# Patient Record
Sex: Female | Born: 1970 | Race: White | Hispanic: No | Marital: Married | Smoking: Former smoker
Health system: Southern US, Community
[De-identification: ages and names within clinical notes are randomized; demographics above are authoritative.]

## PROBLEM LIST (undated history)

## (undated) DIAGNOSIS — K760 Fatty (change of) liver, not elsewhere classified: Secondary | ICD-10-CM

## (undated) DIAGNOSIS — D6859 Other primary thrombophilia: Secondary | ICD-10-CM

## (undated) DIAGNOSIS — E119 Type 2 diabetes mellitus without complications: Secondary | ICD-10-CM

## (undated) DIAGNOSIS — E785 Hyperlipidemia, unspecified: Secondary | ICD-10-CM

## (undated) HISTORY — PX: KNEE SURGERY: SHX244

## (undated) HISTORY — PX: ANKLE SURGERY: SHX546

## (undated) SURGICAL SUPPLY — 2 items
BALLN MUSTANG 10X80X135 (BALLOONS) ×2 IMPLANT
BALLN MUSTANG 7X80X75 (BALLOONS) ×2 IMPLANT

---

## 2009-10-18 ENCOUNTER — Inpatient Hospital Stay (HOSPITAL_COMMUNITY): Admission: EM | Admit: 2009-10-18 | Discharge: 2009-10-25 | Payer: Self-pay | Admitting: Emergency Medicine

## 2009-10-18 ENCOUNTER — Ambulatory Visit: Payer: Self-pay | Admitting: Vascular Surgery

## 2009-10-19 ENCOUNTER — Encounter: Payer: Self-pay | Admitting: Vascular Surgery

## 2011-03-29 LAB — CBC
MCV: 88 fL (ref 78.0–100.0)
Platelets: 260 10*3/uL (ref 150–400)
RBC: 2.9 MIL/uL — ABNORMAL LOW (ref 3.87–5.11)
WBC: 6.5 10*3/uL (ref 4.0–10.5)

## 2011-03-30 LAB — HEPARIN LEVEL (UNFRACTIONATED)
Heparin Unfractionated: 0.2 IU/mL — ABNORMAL LOW (ref 0.30–0.70)
Heparin Unfractionated: 0.33 IU/mL (ref 0.30–0.70)
Heparin Unfractionated: <0.1 IU/mL — ABNORMAL LOW (ref 0.30–0.70)
Heparin Unfractionated: <0.1 IU/mL — ABNORMAL LOW (ref 0.30–0.70)

## 2011-03-30 LAB — COMPREHENSIVE METABOLIC PANEL
ALT: 17 U/L (ref 0–35)
AST: 15 U/L (ref 0–37)
Alkaline Phosphatase: 88 U/L (ref 39–117)
BUN: 7 mg/dL (ref 6–23)
CO2: 25 mEq/L (ref 19–32)
Chloride: 103 mEq/L (ref 96–112)
Creatinine, Ser: 0.56 mg/dL (ref 0.4–1.2)
GFR calc Af Amer: >60 mL/min (ref >60)
GFR calc non Af Amer: >60 mL/min (ref >60)
Potassium: 3.5 mEq/L (ref 3.5–5.1)
Sodium: 134 mEq/L — ABNORMAL LOW (ref 135–145)
Total Bilirubin: 0.9 mg/dL (ref 0.3–1.2)

## 2011-03-30 LAB — CBC
HCT: 24.4 % — ABNORMAL LOW (ref 36.0–46.0)
HCT: 25.5 % — ABNORMAL LOW (ref 36.0–46.0)
HCT: 27.9 % — ABNORMAL LOW (ref 36.0–46.0)
HCT: 36.3 % (ref 36.0–46.0)
Hemoglobin: 12.6 g/dL (ref 12.0–15.0)
Hemoglobin: 8.6 g/dL — ABNORMAL LOW (ref 12.0–15.0)
Hemoglobin: 9 g/dL — ABNORMAL LOW (ref 12.0–15.0)
Hemoglobin: 9.2 g/dL — ABNORMAL LOW (ref 12.0–15.0)
MCHC: 34.6 g/dL (ref 30.0–36.0)
MCHC: 34.8 g/dL (ref 30.0–36.0)
MCHC: 35.3 g/dL (ref 30.0–36.0)
MCHC: 35.4 g/dL (ref 30.0–36.0)
MCHC: 35.5 g/dL (ref 30.0–36.0)
MCHC: 35.8 g/dL (ref 30.0–36.0)
MCV: 87.5 fL (ref 78.0–100.0)
MCV: 87.6 fL (ref 78.0–100.0)
Platelets: 139 10*3/uL — ABNORMAL LOW (ref 150–400)
Platelets: 148 10*3/uL — ABNORMAL LOW (ref 150–400)
Platelets: 167 10*3/uL (ref 150–400)
Platelets: 176 10*3/uL (ref 150–400)
Platelets: 213 10*3/uL (ref 150–400)
RBC: 3.15 MIL/uL — ABNORMAL LOW (ref 3.87–5.11)
RBC: 3.46 MIL/uL — ABNORMAL LOW (ref 3.87–5.11)
RBC: 4.13 MIL/uL (ref 3.87–5.11)
RDW: 15.4 % (ref 11.5–15.5)
RDW: 15.4 % (ref 11.5–15.5)
RDW: 15.6 % — ABNORMAL HIGH (ref 11.5–15.5)
RDW: 15.9 % — ABNORMAL HIGH (ref 11.5–15.5)
RDW: 15.9 % — ABNORMAL HIGH (ref 11.5–15.5)
WBC: 13.4 10*3/uL — ABNORMAL HIGH (ref 4.0–10.5)
WBC: 8.1 10*3/uL (ref 4.0–10.5)
WBC: 9 10*3/uL (ref 4.0–10.5)

## 2011-03-30 LAB — BASIC METABOLIC PANEL
BUN: 4 mg/dL — ABNORMAL LOW (ref 6–23)
BUN: 4 mg/dL — ABNORMAL LOW (ref 6–23)
BUN: 4 mg/dL — ABNORMAL LOW (ref 6–23)
CO2: 22 mEq/L (ref 19–32)
CO2: 27 mEq/L (ref 19–32)
CO2: 29 mEq/L (ref 19–32)
Calcium: 7.7 mg/dL — ABNORMAL LOW (ref 8.4–10.5)
Calcium: 8.1 mg/dL — ABNORMAL LOW (ref 8.4–10.5)
Calcium: 8.2 mg/dL — ABNORMAL LOW (ref 8.4–10.5)
Calcium: 9.5 mg/dL (ref 8.4–10.5)
Chloride: 101 mEq/L (ref 96–112)
Chloride: 99 mEq/L (ref 96–112)
Creatinine, Ser: 0.46 mg/dL (ref 0.4–1.2)
Creatinine, Ser: 0.55 mg/dL (ref 0.4–1.2)
Creatinine, Ser: 0.6 mg/dL (ref 0.4–1.2)
GFR calc Af Amer: >60 mL/min (ref >60)
GFR calc non Af Amer: >60 mL/min (ref >60)
Glucose, Bld: 100 mg/dL — ABNORMAL HIGH (ref 70–99)
Glucose, Bld: 104 mg/dL — ABNORMAL HIGH (ref 70–99)
Sodium: 133 mEq/L — ABNORMAL LOW (ref 135–145)
Sodium: 137 mEq/L (ref 135–145)

## 2011-03-30 LAB — DIFFERENTIAL
Eosinophils Absolute: 0.1 10*3/uL (ref 0.0–0.7)
Lymphocytes Relative: 16 % (ref 12–46)
Lymphs Abs: 2.1 10*3/uL (ref 0.7–4.0)
Monocytes Relative: 3 % (ref 3–12)
Neutrophils Relative %: 80 % — ABNORMAL HIGH (ref 43–77)

## 2011-03-30 LAB — FIBRINOGEN
Fibrinogen: 186 mg/dL — ABNORMAL LOW (ref 204–475)
Fibrinogen: 206 mg/dL (ref 204–475)

## 2011-03-30 LAB — PROTIME-INR
Prothrombin Time: 12.7 seconds (ref 11.6–15.2)
Prothrombin Time: 13.4 seconds (ref 11.6–15.2)
Prothrombin Time: 14.2 seconds (ref 11.6–15.2)

## 2011-03-30 LAB — TSH: TSH: 7.504 u[IU]/mL — ABNORMAL HIGH (ref 0.350–4.500)

## 2011-08-08 ENCOUNTER — Other Ambulatory Visit (HOSPITAL_COMMUNITY): Payer: Self-pay | Admitting: Obstetrics and Gynecology

## 2011-08-08 DIAGNOSIS — Z3682 Encounter for antenatal screening for nuchal translucency: Secondary | ICD-10-CM

## 2011-08-18 ENCOUNTER — Ambulatory Visit (HOSPITAL_COMMUNITY)
Admission: RE | Admit: 2011-08-18 | Discharge: 2011-08-18 | Disposition: A | Discharge status: Home / Self Care | Payer: Medicaid Other | Source: Ambulatory Visit | Attending: Obstetrics and Gynecology | Admitting: Obstetrics and Gynecology

## 2011-08-18 ENCOUNTER — Encounter (HOSPITAL_COMMUNITY): Payer: Self-pay

## 2011-08-18 ENCOUNTER — Other Ambulatory Visit: Payer: Self-pay | Admitting: Maternal and Fetal Medicine

## 2011-08-18 DIAGNOSIS — O09529 Supervision of elderly multigravida, unspecified trimester: Secondary | ICD-10-CM | POA: Insufficient documentation

## 2011-08-18 DIAGNOSIS — Z3682 Encounter for antenatal screening for nuchal translucency: Secondary | ICD-10-CM

## 2011-08-18 DIAGNOSIS — O34219 Maternal care for unspecified type scar from previous cesarean delivery: Secondary | ICD-10-CM | POA: Insufficient documentation

## 2011-08-18 NOTE — Progress Notes (Signed)
Genetic Counseling  High-Risk Gestation Note  Appointment Date:  08/18/2011 Referred By: Hyman Hopes., MD Date of Birth:  17-Oct-1971  Pregnancy History: [id number] Estimated Date of Delivery: 02/25/12 Estimated Gestational Age: [redacted]w[redacted]d  I met with [name] for prenatal genetic counseling given advanced maternal age.   She was counseled regarding maternal age and the association with risk for chromosome conditions due to nondisjunction with aging of the ova.   We reviewed chromosomes, nondisjunction, and the associated 1 in 27 risk for fetal aneuploidy in the first trimester related to a maternal age of 79 at delivery.  She was counseled that the risk for aneuploidy decreases as gestational age increases, accounting for those pregnancies which spontaneously abort.  We specifically discussed Down syndrome (trisomy 33), trisomies 60 and 22, and sex chromosome aneuploidies (47,XXX and 47,XXY) including the common features and prognoses of each.    We reviewed available screening and diagnostic options.  Regarding screening tests, we discussed the options of First screen, Integrated screen and ultrasound.  She understands that screening tests are used to modify a patient's a priori risk for aneuploidy, typically based on age.  This estimate provides a pregnancy specific risk assessment. It is not diagnostic, nor does it screen for all chromosome conditions.  We also reviewed the availability of diagnostic options including CVS and amniocentesis. A risk of 1 in 100 was given for CVS and 1 in 200-300 was given for amniocentesis, the primary complication being spontaneous pregnancy loss.   We discussed the risks, limitations, and benefits of each.  After reviewing these options, Shelby Conner elected to have First screen at this time.  She wishes to pursue these options to help ascertain her pregnancy specific risks for aneuploidy and stated that she likely will consider amniocentesis in  the second trimester.  She understands that ultrasound and First screen cannot rule out all birth defects or genetic syndromes.  [However, she was counseled that 50-80% of fetuses with Down syndrome and up to 90% of fetuses with trisomies 30 and 38, when well visualized, have detectable anomalies or soft markers by ultrasound.]   We discussed cystic fibrosis (CF) including: the features of CF, the incidence of 1 in 3300 in the Caucasian population, autosomal recessive inheritance, and the 25% chance of having a baby with CF if both parents are carriers of CF.  We also discussed the option of carrier testing including the pros and cons of carrier testing, as well as the option of prenatal testing if needed. We discussed that CF is included on the newborn screening panel in [place]. [name] elected to proceed with CF carrier screening at this time.   Both family histories were reviewed and found to be contributory  for a history of 3 miscarriages for the patient and bilateral pulmonary embolism in a previous pregnancy. 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 The patient reported that she is currently taking Lovenox given this history. She reported that her sister, her sister's children, and her father also have protein S and protein C deficiency. The family history is otherwise unremarkable for birth defects, mental retardation, recurrent pregnancy loss, and known genetic conditions. Without further information regarding the provided family history, an accurate genetic risk cannot be calculated.   Further genetic counseling is warranted if more information is obtained.  She denied exposure to environmental toxins or chemical agents.  She denied the use of tobacco or street drugs. She reported alcohol use  until 7 weeks, when she was aware of the pregnancy.  Prenatal alcohol exposure can increase the risk for growth delays, small head size, heart defects, eye and  facial differences, as well as behavior problems and learning disabilities. The risk of these to occur tends to increase with the amount of alcohol consumed. However, because there is no identified safe amount of alcohol in pregnancy, it is recommended to completely avoid alcohol in pregnancy. Given the reported amount of exposure, risk for associated effects are likely low in the current pregnancy. She denied significant viral illnesses during the course of her pregnancy.  Her medical and surgical history were contributory for protein S and C deficiency, which is treated by Lovenox, as previously discussed..  A complete obstetrical ultrasound was performed at the time of today's evaluation. The ultrasound report is reported separately.    We counseled the patient for approximately 30 minutes regarding the above risks.   Shelby Braun Vergia Chea, MS, [facility] 08/18/2011

## 2011-09-07 ENCOUNTER — Telehealth (HOSPITAL_COMMUNITY): Payer: Self-pay | Admitting: MS"

## 2011-09-07 NOTE — Telephone Encounter (Signed)
First trimester screen within normal range. Down syndrome risk reduced to 1 in 1500, and trisomy 18 risk reduced to 1 in 10,000. Reviewed not diagnostic and AFP only serun screen available in 2nd trimester, if desired for open neural tube defect screening. Patient and husband are considering amnio. She will contact our office this week if she desires to schedule amniocentesis. Cystic fibrosis carrier screen is negative for 97 most common mutations. Reduced risk to be carrier from 1 in 25 to 1 in 343.

## 2014-10-26 ENCOUNTER — Encounter (HOSPITAL_COMMUNITY): Payer: Self-pay

## 2020-11-11 ENCOUNTER — Inpatient Hospital Stay (HOSPITAL_COMMUNITY)
Admission: EM | Admit: 2020-11-11 | Discharge: 2020-11-17 | DRG: 433 | Disposition: A | Discharge status: Home Health | Payer: BC Managed Care – PPO | Attending: Internal Medicine | Admitting: Internal Medicine

## 2020-11-11 ENCOUNTER — Other Ambulatory Visit: Payer: Self-pay

## 2020-11-11 ENCOUNTER — Encounter (HOSPITAL_COMMUNITY): Payer: Self-pay | Admitting: Emergency Medicine

## 2020-11-11 ENCOUNTER — Emergency Department (HOSPITAL_COMMUNITY): Payer: BC Managed Care – PPO

## 2020-11-11 ENCOUNTER — Encounter (HOSPITAL_COMMUNITY): Payer: Self-pay

## 2020-11-11 DIAGNOSIS — K766 Portal hypertension: Secondary | ICD-10-CM | POA: Diagnosis not present

## 2020-11-11 DIAGNOSIS — Z789 Other specified health status: Secondary | ICD-10-CM | POA: Diagnosis not present

## 2020-11-11 DIAGNOSIS — E669 Obesity, unspecified: Secondary | ICD-10-CM | POA: Diagnosis present

## 2020-11-11 DIAGNOSIS — N179 Acute kidney failure, unspecified: Secondary | ICD-10-CM | POA: Diagnosis not present

## 2020-11-11 DIAGNOSIS — E871 Hypo-osmolality and hyponatremia: Secondary | ICD-10-CM | POA: Diagnosis present

## 2020-11-11 DIAGNOSIS — E119 Type 2 diabetes mellitus without complications: Secondary | ICD-10-CM | POA: Diagnosis present

## 2020-11-11 DIAGNOSIS — I10 Essential (primary) hypertension: Secondary | ICD-10-CM | POA: Diagnosis present

## 2020-11-11 DIAGNOSIS — Z515 Encounter for palliative care: Secondary | ICD-10-CM

## 2020-11-11 DIAGNOSIS — M879 Osteonecrosis, unspecified: Secondary | ICD-10-CM | POA: Diagnosis not present

## 2020-11-11 DIAGNOSIS — E785 Hyperlipidemia, unspecified: Secondary | ICD-10-CM | POA: Diagnosis present

## 2020-11-11 DIAGNOSIS — D696 Thrombocytopenia, unspecified: Secondary | ICD-10-CM | POA: Diagnosis present

## 2020-11-11 DIAGNOSIS — Z79899 Other long term (current) drug therapy: Secondary | ICD-10-CM

## 2020-11-11 DIAGNOSIS — Z23 Encounter for immunization: Secondary | ICD-10-CM | POA: Diagnosis not present

## 2020-11-11 DIAGNOSIS — Z7984 Long term (current) use of oral hypoglycemic drugs: Secondary | ICD-10-CM

## 2020-11-11 DIAGNOSIS — I959 Hypotension, unspecified: Secondary | ICD-10-CM | POA: Diagnosis present

## 2020-11-11 DIAGNOSIS — R188 Other ascites: Secondary | ICD-10-CM

## 2020-11-11 DIAGNOSIS — K2970 Gastritis, unspecified, without bleeding: Secondary | ICD-10-CM | POA: Diagnosis present

## 2020-11-11 DIAGNOSIS — K209 Esophagitis, unspecified without bleeding: Secondary | ICD-10-CM | POA: Diagnosis present

## 2020-11-11 DIAGNOSIS — D539 Nutritional anemia, unspecified: Secondary | ICD-10-CM | POA: Diagnosis present

## 2020-11-11 DIAGNOSIS — K3189 Other diseases of stomach and duodenum: Secondary | ICD-10-CM | POA: Diagnosis present

## 2020-11-11 DIAGNOSIS — D6859 Other primary thrombophilia: Secondary | ICD-10-CM | POA: Diagnosis present

## 2020-11-11 DIAGNOSIS — K7460 Unspecified cirrhosis of liver: Secondary | ICD-10-CM | POA: Diagnosis not present

## 2020-11-11 DIAGNOSIS — R7989 Other specified abnormal findings of blood chemistry: Secondary | ICD-10-CM | POA: Diagnosis present

## 2020-11-11 DIAGNOSIS — F1010 Alcohol abuse, uncomplicated: Secondary | ICD-10-CM | POA: Diagnosis not present

## 2020-11-11 DIAGNOSIS — K7031 Alcoholic cirrhosis of liver with ascites: Principal | ICD-10-CM | POA: Diagnosis present

## 2020-11-11 DIAGNOSIS — K7581 Nonalcoholic steatohepatitis (NASH): Secondary | ICD-10-CM | POA: Diagnosis not present

## 2020-11-11 DIAGNOSIS — Z20822 Contact with and (suspected) exposure to covid-19: Secondary | ICD-10-CM | POA: Diagnosis not present

## 2020-11-11 DIAGNOSIS — K7290 Hepatic failure, unspecified without coma: Principal | ICD-10-CM | POA: Diagnosis present

## 2020-11-11 DIAGNOSIS — Z7901 Long term (current) use of anticoagulants: Secondary | ICD-10-CM

## 2020-11-11 DIAGNOSIS — Z6832 Body mass index (BMI) 32.0-32.9, adult: Secondary | ICD-10-CM

## 2020-11-11 DIAGNOSIS — E876 Hypokalemia: Secondary | ICD-10-CM | POA: Diagnosis present

## 2020-11-11 DIAGNOSIS — D509 Iron deficiency anemia, unspecified: Secondary | ICD-10-CM | POA: Diagnosis not present

## 2020-11-11 DIAGNOSIS — Z7189 Other specified counseling: Secondary | ICD-10-CM | POA: Diagnosis not present

## 2020-11-11 DIAGNOSIS — Z87891 Personal history of nicotine dependence: Secondary | ICD-10-CM

## 2020-11-11 DIAGNOSIS — R161 Splenomegaly, not elsewhere classified: Secondary | ICD-10-CM | POA: Diagnosis present

## 2020-11-11 HISTORY — DX: Other primary thrombophilia: D68.59

## 2020-11-11 HISTORY — DX: Fatty (change of) liver, not elsewhere classified: K76.0

## 2020-11-11 HISTORY — DX: Type 2 diabetes mellitus without complications: E11.9

## 2020-11-11 HISTORY — DX: Hyperlipidemia, unspecified: E78.5

## 2020-11-11 LAB — CBC
HCT: 23.8 % — ABNORMAL LOW (ref 36.0–46.0)
Hemoglobin: 8.2 g/dL — ABNORMAL LOW (ref 12.0–15.0)
MCH: 48 pg — ABNORMAL HIGH (ref 26.0–34.0)
MCHC: 34.5 g/dL (ref 30.0–36.0)
MCV: 139.2 fL — ABNORMAL HIGH (ref 80.0–100.0)
Platelets: 108 10*3/uL — ABNORMAL LOW (ref 150–400)
RBC: 1.71 MIL/uL — ABNORMAL LOW (ref 3.87–5.11)
RDW: 18 % — ABNORMAL HIGH (ref 11.5–15.5)
WBC: 17.5 10*3/uL — ABNORMAL HIGH (ref 4.0–10.5)
nRBC: 0.7 % — ABNORMAL HIGH (ref 0.0–0.2)

## 2020-11-11 LAB — COMPREHENSIVE METABOLIC PANEL
ALT: 44 U/L (ref 0–44)
AST: 126 U/L — ABNORMAL HIGH (ref 15–41)
Albumin: 2.3 g/dL — ABNORMAL LOW (ref 3.5–5.0)
Alkaline Phosphatase: 167 U/L — ABNORMAL HIGH (ref 38–126)
Anion gap: 16 — ABNORMAL HIGH (ref 5–15)
BUN: 20 mg/dL (ref 6–20)
CO2: 36 mmol/L — ABNORMAL HIGH (ref 22–32)
Calcium: 8.9 mg/dL (ref 8.9–10.3)
Chloride: 76 mmol/L — ABNORMAL LOW (ref 98–111)
Creatinine, Ser: 1.32 mg/dL — ABNORMAL HIGH (ref 0.44–1.00)
GFR, Estimated: 50 mL/min — ABNORMAL LOW (ref >60)
Glucose, Bld: 90 mg/dL (ref 70–99)
Potassium: 2.4 mmol/L — CL (ref 3.5–5.1)
Sodium: 128 mmol/L — ABNORMAL LOW (ref 135–145)
Total Bilirubin: 19.8 mg/dL (ref 0.3–1.2)
Total Protein: 8.7 g/dL — ABNORMAL HIGH (ref 6.5–8.1)

## 2020-11-11 LAB — PROTIME-INR
INR: 2.1 — ABNORMAL HIGH (ref 0.8–1.2)
Prothrombin Time: 23 seconds — ABNORMAL HIGH (ref 11.4–15.2)

## 2020-11-11 LAB — RESPIRATORY PANEL BY RT PCR (FLU A&B, COVID)
Influenza A by PCR: NEGATIVE
Influenza B by PCR: NEGATIVE
SARS Coronavirus 2 by RT PCR: NEGATIVE

## 2020-11-11 LAB — LIPASE, BLOOD: Lipase: 82 U/L — ABNORMAL HIGH (ref 11–51)

## 2020-11-11 LAB — I-STAT BETA HCG BLOOD, ED (MC, WL, AP ONLY): I-stat hCG, quantitative: <5 m[IU]/mL (ref <5)

## 2020-11-11 MED ORDER — MAGNESIUM SULFATE 2 GM/50ML IV SOLN
2.0000 g | Freq: Once | INTRAVENOUS | Status: AC
  Administered 2020-11-11: 2 g via INTRAVENOUS
  Filled 2020-11-11: qty 50

## 2020-11-11 MED ORDER — SODIUM CHLORIDE 0.9 % IV SOLN
2.0000 g | INTRAVENOUS | Status: DC
  Administered 2020-11-12 – 2020-11-16 (×5): 2 g via INTRAVENOUS
  Filled 2020-11-11 (×5): qty 20

## 2020-11-11 MED ORDER — HYDROMORPHONE HCL 1 MG/ML IJ SOLN
0.5000 mg | Freq: Four times a day (QID) | INTRAMUSCULAR | Status: AC | PRN
  Administered 2020-11-12 (×2): 0.5 mg via INTRAVENOUS
  Filled 2020-11-11 (×2): qty 0.5

## 2020-11-11 MED ORDER — POTASSIUM CHLORIDE CRYS ER 20 MEQ PO TBCR
40.0000 meq | EXTENDED_RELEASE_TABLET | Freq: Once | ORAL | Status: AC
  Administered 2020-11-11: 40 meq via ORAL
  Filled 2020-11-11: qty 2

## 2020-11-11 MED ORDER — HYDROMORPHONE HCL 1 MG/ML IJ SOLN
0.5000 mg | Freq: Once | INTRAMUSCULAR | Status: AC
  Administered 2020-11-11: 0.5 mg via INTRAVENOUS
  Filled 2020-11-11: qty 1

## 2020-11-11 MED ORDER — IOHEXOL 300 MG/ML  SOLN
100.0000 mL | Freq: Once | INTRAMUSCULAR | Status: AC | PRN
  Administered 2020-11-11: 100 mL via INTRAVENOUS

## 2020-11-11 MED ORDER — SODIUM CHLORIDE 0.9 % IV BOLUS
1000.0000 mL | Freq: Once | INTRAVENOUS | Status: AC
  Administered 2020-11-11: 1000 mL via INTRAVENOUS

## 2020-11-11 MED ORDER — LACTULOSE 10 GM/15ML PO SOLN
20.0000 g | Freq: Two times a day (BID) | ORAL | Status: DC
  Administered 2020-11-12: 20 g via ORAL
  Filled 2020-11-11: qty 30

## 2020-11-11 MED ORDER — ONDANSETRON HCL 4 MG/2ML IJ SOLN
4.0000 mg | Freq: Once | INTRAMUSCULAR | Status: AC
  Administered 2020-11-11: 4 mg via INTRAVENOUS
  Filled 2020-11-11: qty 2

## 2020-11-11 MED ORDER — POTASSIUM CHLORIDE 10 MEQ/100ML IV SOLN
10.0000 meq | INTRAVENOUS | Status: AC
  Administered 2020-11-11 (×3): 10 meq via INTRAVENOUS
  Filled 2020-11-11 (×3): qty 100

## 2020-11-11 MED ORDER — SODIUM CHLORIDE 0.9% FLUSH
3.0000 mL | Freq: Two times a day (BID) | INTRAVENOUS | Status: DC
  Administered 2020-11-11 – 2020-11-16 (×7): 3 mL via INTRAVENOUS

## 2020-11-11 NOTE — ED Notes (Signed)
Pt admitted to 5N27; report called to [place], [place].

## 2020-11-11 NOTE — Progress Notes (Signed)
Patient arrived via stretcher to room 527 with belongings. Patient settled into room. Telemetry box placed on her and orders reviewed. Admission assessment will be completed shortly. Will continue to monitor.

## 2020-11-11 NOTE — ED Provider Notes (Signed)
[facility] Emergency Department Provider Note MRN:  [id number]  Arrival date & time: 11/11/20     Chief Complaint   Abdominal Pain   History of Present Illness   Shelby Conner is a 49 y.o. year-old female with a history of diabetes, protein C&S deficiency presenting to the ED with chief complaint of abdominal pain.  Bilateral flank and abdominal pain progressively worsening over the past 1 to 2 weeks.  Past few days her children have told her that she is turning yellow.  Endorsing persistent nausea and vomiting, trouble taking her home medications.  Denies fever, no chest pain or shortness of breath, no other complaints.  Review of Systems  A complete 10 system review of systems was obtained and all systems are negative except as noted in the HPI and PMH.   Patient's Health History    Past Medical History:  Diagnosis Date  . Diabetes mellitus without complication (HCC)   . Fatty liver   . Hyperlipidemia   . Protein C deficiency (HCC)   . Protein S deficiency [facility])     Past Surgical History:  Procedure Laterality Date  . ANKLE SURGERY Right   . CESAREAN SECTION    . KNEE SURGERY Left     History reviewed. No pertinent family history.  Social History   Socioeconomic History  . Marital status: Divorced    Spouse name: Not on file  . Number of children: Not on file  . Years of education: Not on file  . Highest education level: Not on file  Occupational History  . Not on file  Tobacco Use  . Smoking status: Former Games developer  . Smokeless tobacco: Never Used  Vaping Use  . Vaping Use: Never used  Substance and Sexual Activity  . Alcohol use: Yes    Comment: occasionally  . Drug use: Never  . Sexual activity: Not on file  Other Topics Concern  . Not on file  Social History Narrative  . Not on file   Social Determinants of Health   Financial Resource Strain:   . Difficulty of Paying Living Expenses: Not on file  Food Insecurity:    . Worried About Programme researcher, broadcasting/film/video in the Last Year: Not on file  . Ran Out of Food in the Last Year: Not on file  Transportation Needs:   . Lack of Transportation (Medical): Not on file  . Lack of Transportation (Non-Medical): Not on file  Physical Activity:   . Days of Exercise per Week: Not on file  . Minutes of Exercise per Session: Not on file  Stress:   . Feeling of Stress : Not on file  Social Connections:   . Frequency of Communication with Friends and Family: Not on file  . Frequency of Social Gatherings with Friends and Family: Not on file  . Attends Religious Services: Not on file  . Active Member of Clubs or Organizations: Not on file  . Attends Banker Meetings: Not on file  . Marital Status: Not on file  Intimate Partner Violence:   . Fear of Current or Ex-Partner: Not on file  . Emotionally Abused: Not on file  . Physically Abused: Not on file  . Sexually Abused: Not on file     Physical Exam   Vitals:   11/11/20 2130 11/11/20 2145  BP: 101/65 135/73  Pulse: 78 89  Resp: 17 17  Temp:    SpO2: 96% 94%    CONSTITUTIONAL: Chronically ill-appearing, NAD,  diffusely jaundiced NEURO:  Alert and oriented x 3, no focal deficits EYES:  eyes equal and reactive ENT/NECK:  no LAD, no JVD CARDIO: Regular rate, well-perfused, normal S1 and S2 PULM:  CTAB no wheezing or rhonchi GI/GU:  normal bowel sounds, non-distended, non-tender MSK/SPINE:  No gross deformities, moderate distention, mildly tender diffusely SKIN:  no rash, atraumatic PSYCH:  Appropriate speech and behavior  *Additional and/or pertinent findings included in MDM below  Diagnostic and Interventional Summary    EKG Interpretation  Date/Time:    Ventricular Rate:    PR Interval:    QRS Duration:   QT Interval:    QTC Calculation:   R Axis:     Text Interpretation:        Labs Reviewed  LIPASE, BLOOD - Abnormal; Notable for the following components:      Result Value    Lipase 82 (*)    All other components within normal limits  COMPREHENSIVE METABOLIC PANEL - Abnormal; Notable for the following components:   Sodium 128 (*)    Potassium 2.4 (*)    Chloride 76 (*)    CO2 36 (*)    Creatinine, Ser 1.32 (*)    Total Protein 8.7 (*)    Albumin 2.3 (*)    AST 126 (*)    Alkaline Phosphatase 167 (*)    Total Bilirubin 19.8 (*)    GFR, Estimated 50 (*)    Anion gap 16 (*)    All other components within normal limits  CBC - Abnormal; Notable for the following components:   WBC 17.5 (*)    RBC 1.71 (*)    Hemoglobin 8.2 (*)    HCT 23.8 (*)    MCV 139.2 (*)    MCH 48.0 (*)    RDW 18.0 (*)    Platelets 108 (*)    nRBC 0.7 (*)    All other components within normal limits  RESPIRATORY PANEL BY RT PCR (FLU A&B, COVID)  URINALYSIS, ROUTINE W REFLEX MICROSCOPIC  PROTIME-INR  HEPATITIS PANEL, ACUTE  I-STAT BETA HCG BLOOD, ED ([facility], [facility], [facility] ONLY)    US Abdomen Limited RUQ (LIVER/GB)  Final Result    CT ABDOMEN PELVIS W CONTRAST  Final Result    US LIVER DOPPLER    (Results Pending)    Medications  potassium chloride 10 mEq in 100 mL IVPB (10 mEq Intravenous New Bag/Given 11/11/20 2129)  sodium chloride 0.9 % bolus 1,000 mL (0 mLs Intravenous Stopped 11/11/20 1949)  HYDROmorphone (DILAUDID) injection 0.5 mg (0.5 mg Intravenous Given 11/11/20 1859)  magnesium sulfate IVPB 2 g 50 mL (0 g Intravenous Stopped 11/11/20 2004)  potassium chloride SA (KLOR-CON) CR tablet 40 mEq (40 mEq Oral Given 11/11/20 1851)  ondansetron (ZOFRAN) injection 4 mg (4 mg Intravenous Given 11/11/20 1857)  iohexol (OMNIPAQUE) 300 MG/ML solution 100 mL (100 mLs Intravenous Contrast Given 11/11/20 1941)     Procedures  /  Critical Care .Critical Care Performed by: Sabas Sous, MD Authorized by: Sabas Sous, MD   Critical care provider statement:    Critical care time (minutes):  45   Critical care was necessary to treat or prevent imminent or life-threatening  deterioration of the following conditions: Hepatic failure.   Critical care was time spent personally by me on the following activities:  Discussions with consultants, evaluation of patient's response to treatment, examination of patient, ordering and performing treatments and interventions, ordering and review of laboratory studies, ordering and review of  radiographic studies, pulse oximetry, re-evaluation of patient's condition, obtaining history from patient or surrogate and review of old charts    ED Course and Medical Decision Making  I have reviewed the triage vital signs, the nursing notes, and pertinent available records from the EMR.  Listed above are laboratory and imaging tests that I personally ordered, reviewed, and interpreted and then considered in my medical decision making (see below for details).  Concern for biliary obstruction, patient does not drink heavily, has no history of cirrhosis, denies heavy Tylenol use, no mustard ingestion.  Concern for possible choledocholithiasis versus portal venous thrombus given her hypercoagulable state.  Awaiting CT and ultrasound.     Work-up revealing cholelithiasis, no pancreatic mass.  Still awaiting liver Doppler.  Given the acute nature, there is concern for choledocholithiasis and a biliary obstruction.  Patient has no fever, vital signs improving with fluids, doubt cholangitis.  Will admit to hospital service, given the acute nature and severity of labs will also consult GI to make them aware.  Elmer Sow. Pilar Plate, MD [facility] Emergency Medicine [facility] [email or web address]  Final Clinical Impressions(s) / ED Diagnoses     ICD-10-CM   1. Decompensation of cirrhosis of liver (HCC)  K72.90    K74.60   2. LFT elevation  R79.89     ED Discharge Orders    None       Discharge Instructions Discussed with and Provided to Patient:   Discharge Instructions   None       Sabas Sous, MD 11/11/20  2212

## 2020-11-11 NOTE — ED Notes (Addendum)
Pt dropped O2 sat; O2 increased from 2L to 6L with improvement to 91%.

## 2020-11-11 NOTE — ED Triage Notes (Signed)
Pt endorses abd pain and back pain for 3 days. Pt jaundice and belly distended. Hx of DM, 2 blood disorders.

## 2020-11-11 NOTE — H&P (Addendum)
History and Physical   Shelby Conner JXB:[id number] DOB: 12/21/1971 DOA: 11/11/2020  PCP: Pcp, No   Patient coming from: Home  Chief Complaint: Abdominal pain  HPI: Shelby Conner is a 49 y.o. female with medical history significant of fatty liver, diabetes, protein C and S deficiency presents with progressive abdominal pain for the past 1 to 2 weeks.  Patient states that she has progressive abdominal pain for the past 1 to 2 weeks.  She is also noted progressive jaundice and abdominal distention for the past week.  She reports nausea vomiting.  She reports some mild shortness of breath and chest pain that appear to be related to inability to take deep breaths because of abdominal pain.  She denies fevers, constipation, diarrhea.  She states that she is an occasional drinker but has no significant alcohol use beyond that.  She does not use significant acetaminophen.  Does have a history of fatty liver which was diagnosed 6 years ago per her report.  ED Course: Vital signs significant for soft blood pressure in the 90s to 100s systolic, otherwise vital signs stable.  Lab work showed sodium 128, potassium 2.4, bicarb 36, leukocytosis of 17, hemoglobin stable at 8.2, platelets of 108.  LFTs showed AST 126, ALT 44, ALP 167, bili 19.8.  Lipase of 82.  UA and hepatitis panel are pending.  CT abdomen showed cholelithiasis, cirrhotic liver with signs of portal hypertension.  Ultrasound the abdomen showed cirrhosis and ascites.  Liver Doppler was completed but not yet read.  Patient received IV and p.o. potassium, IV pain medication, 1 L IV fluid bolus.  Review of Systems: As per HPI otherwise all other systems reviewed and are negative.  Past Medical History:  Diagnosis Date  . Diabetes mellitus without complication (HCC)   . Fatty liver   . Hyperlipidemia   . Protein C deficiency (HCC)   . Protein S deficiency [facility](HCC)     Past Surgical History:  Procedure Laterality Date  . ANKLE  SURGERY Right   . CESAREAN SECTION    . KNEE SURGERY Left     Social History  reports that she has quit smoking. She has never used smokeless tobacco. She reports current alcohol use. She reports that she does not use drugs.  No Known Allergies  History reviewed. No pertinent family history. Reviewed on admission  Prior to Admission medications   Medication Sig Start Date End Date Taking? Authorizing Provider  enoxaparin (LOVENOX) 80 MG/0.8ML SOLN Inject into the skin 2 (two) times daily.      [provider]  ezetimibe (ZETIA) 10 MG tablet Take 10 mg by mouth daily. 09/01/20   [provider]  metFORMIN (GLUCOPHAGE) 500 MG tablet Take 500 mg by mouth 2 (two) times daily. 06/08/20   [provider]  PRENATAL VITAMINS PO Take by mouth.      [provider]  rosuvastatin (CRESTOR) 40 MG tablet Take 40 mg by mouth daily. 09/01/20   [provider]  XARELTO 20 MG TABS tablet Take 20 mg by mouth at bedtime. 09/01/20   [provider]    Physical Exam: Vitals:   11/11/20 2215 11/11/20 2300 11/11/20 2347 11/12/20 0002  BP: (!) 92/58 96/62 98/64    Pulse: 86 82 86   Resp: 11 14 17    Temp:   98.6 F (37 C)   TempSrc:   Oral   SpO2: 92% 96% 90%   Weight:    74.3 kg  Height:    5'  1" (1.549 m)   Physical Exam Constitutional:      Comments: Ill appearing female in mild discomfort  HENT:     Head: Normocephalic and atraumatic.     Mouth/Throat:     Mouth: Mucous membranes are moist.     Pharynx: Oropharynx is clear.  Eyes:     General: Scleral icterus present.     Extraocular Movements: Extraocular movements intact.     Pupils: Pupils are equal, round, and reactive to light.  Cardiovascular:     Rate and Rhythm: Normal rate and regular rhythm.     Pulses: Normal pulses.     Heart sounds: Normal heart sounds.  Pulmonary:     Effort: Pulmonary effort is normal. No respiratory distress.     Breath sounds: Normal breath sounds.   Abdominal:     General: Bowel sounds are normal. There is distension.     Palpations: Abdomen is soft.     Tenderness: There is abdominal tenderness.     Comments: Diffusely tender to palpation  Musculoskeletal:        General: No swelling or deformity.  Skin:    General: Skin is warm and dry.     Coloration: Skin is jaundiced.  Neurological:     General: No focal deficit present.     Comments: Mildly confused at times but able to answer all questions appropriately if given time to think    Labs on Admission: I have personally reviewed following labs and imaging studies  CBC: Recent Labs  Lab 11/11/20 1612  WBC 17.5*  HGB 8.2*  HCT 23.8*  MCV 139.2*  PLT 108*    Basic Metabolic Panel: Recent Labs  Lab 11/11/20 1612  NA 128*  K 2.4*  CL 76*  CO2 36*  GLUCOSE 90  BUN 20  CREATININE 1.32*  CALCIUM 8.9    GFR: Estimated Creatinine Clearance: 48.6 mL/min (A) (by C-G formula based on SCr of 1.32 mg/dL (H)).  Liver Function Tests: Recent Labs  Lab 11/11/20 1612  AST 126*  ALT 44  ALKPHOS 167*  BILITOT 19.8*  PROT 8.7*  ALBUMIN 2.3*    Urine analysis: No results found for: COLORURINE, APPEARANCEUR, LABSPEC, PHURINE, GLUCOSEU, HGBUR, BILIRUBINUR, KETONESUR, PROTEINUR, UROBILINOGEN, NITRITE, LEUKOCYTESUR  Radiological Exams on Admission: CT ABDOMEN PELVIS W CONTRAST  Result Date: 11/11/2020 CLINICAL DATA:  49 year old female with abdominal pain. EXAM: CT ABDOMEN AND PELVIS WITH CONTRAST TECHNIQUE: Multidetector CT imaging of the abdomen and pelvis was performed using the standard protocol following bolus administration of intravenous contrast. CONTRAST:  OMNIPAQUE IOHEXOL 300 MG/ML  SOLN COMPARISON:  None. FINDINGS: Lower chest: The visualized lung bases are clear. No intra-abdominal free air. Small ascites. Hepatobiliary: There is severe fatty infiltration of the liver with surface irregularity consistent with changes of cirrhosis. The liver is enlarged  measuring 19 cm in midclavicular length. Small gallstones. No pericholecystic fluid. Pancreas: Unremarkable. No pancreatic ductal dilatation or surrounding inflammatory changes. Spleen: Mildly enlarged spleen measuring approximately 14 cm in greatest length. Adrenals/Urinary Tract: The adrenal glands unremarkable. There is no hydronephrosis on either side. The visualized ureters and urinary bladder appear unremarkable. Stomach/Bowel: Diffuse thickened appearance of the colon, likely related to underdistention and hepatic colopathy. Clinical correlation is recommended to exclude colitis. There is no bowel obstruction. The appendix is not identified with certainty. Vascular/Lymphatic: Mild aortoiliac atherosclerotic disease. The IVC is unremarkable. An infrarenal IVC filter is noted. There is a left common iliac vein stent which is poorly evaluated on this  CT due to suboptimal opacification. No portal venous gas. Upper abdominal varices including paraesophageal and gastric varices. There is no adenopathy. Reproductive: The uterus is anteverted and grossly unremarkable. No adnexal masses. Other: Mild subcutaneous edema. 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 No acute fracture or cortical collapse. IMPRESSION: 1. Enlarged cirrhotic liver with severe fatty infiltration and evidence of portal hypertension including splenomegaly, ascites, and upper abdominal varices. 2. Cholelithiasis. 3. Underdistention of the colon versus hepatic colopathy. Clinical correlation is recommended to exclude colitis. No bowel obstruction. 4. 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 No acute fracture or cortical collapse. 5. Aortic Atherosclerosis (ICD10-I70.0). Electronically Signed   By: Elgie Collard M.D.   On: 11/11/2020 19:57   US Abdomen Limited RUQ (LIVER/GB)  Result Date: 11/11/2020 CLINICAL DATA:  Elevated LFTs EXAM: ULTRASOUND ABDOMEN LIMITED RIGHT UPPER QUADRANT  COMPARISON:  None. FINDINGS: Gallbladder: No gallstones or wall thickening visualized. No sonographic Murphy sign noted by sonographer. Common bile duct: Diameter: 2 mm, nondilated Liver: Diffusely increased hepatic echogenicity with a slightly nodular hepatic surface contour. No focal liver lesion is seen though evaluation limited by diminished through transmission of the liver. No visible intrahepatic biliary ductal dilatation. Portal vein is patent on color Doppler imaging with normal direction of blood flow towards the liver. Other: Extensive abdominal bowel gas. Abdominal ascites seen in all 4 quadrants. IMPRESSION: 1. Diffusely increased hepatic echogenicity with diminished through transmission as well as a nodular hepatic surface contour. Findings are somewhat nonspecific but can be seen in the setting of hepatic steatosis and intrinsic liver disease/cirrhosis. 2. Abdominal ascites is seen in all 4 quadrants. Electronically Signed   By: Kreg Shropshire M.D.   On: 11/11/2020 20:22   EKG: Not yet obtained, ordered.  Assessment/Plan Principal Problem:   Decompensated hepatic cirrhosis (HCC) Active Problems:   Hypokalemia   Protein S deficiency (HCC)   Protein C deficiency (HCC)   Ascites   Type 2 diabetes mellitus without complication (HCC)  ?SBP Fatty Liver Abdominal Pain New decompensated hepatic cirrhosis > MELD 32 on admission, CT and ultrasound with cirrhosis, and ascites.  Choleliths also noted but no ductal dilation. > GI consulted in ED  > 1-2 weeks of worsening abdominal pain, ascites, and jaundice > 6 year hx of fatty liver, no previous cirrhosis diagnosis > Hepatitis labs ordered in ED - Appreciate GI recommendation (will see in AM) - IR for Dx / Therapeutic Paracentesis (Pro, LDH, Cell ct, Gram stain, Cx) - Ceftriaxone 2g Daily for possible SBP - Lactulose twice daily given mild confusion, goal of at least 2 BMs per day - AM CMP, CBC, INR, LDH  Hypokalemia Acute renal  failure > Creatinine 1.32 from baseline of around 0.5.  Concern for possible hepatorenal, will monitor response to initial 1 L received in ED. > K 2.4 in the ED > Received 40 mEq p.o. KCl and 3 rounds of IV, for a total of 70 mEq in ED > Received IV magnesium in ED - We will check magnesium level - Add additional 2 rounds of IV potassium - Recheck in a.m. - Obtaining EKG, not yet done  Protein C and S deficiency > Patient states she has been transitioned to Xarelto - Will hold for tonight and plan to resume tomorrow unless work-up shows reason for discontinuation  Diabetes > Metformin at home - Hold p.o. Metformin - SSI  DVT prophylaxis: Xarelto  Code Status:   Full Family Communication:  None  on admission  Disposition Plan:   Patient is from:  Home  Anticipated DC to:  Home  Anticipated DC date:  Pending clinical course  Anticipated DC barriers: New diagnosis of cirrhosis  Consults called:  GI, consulted by EDP stated we will see tomorrow Admission status:  Inpatient, telemetry  Severity of Illness: The appropriate patient status for this patient is INPATIENT. Inpatient status is judged to be reasonable and necessary in order to provide the required intensity of service to ensure the patient's safety. The patient's presenting symptoms, physical exam findings, and initial radiographic and laboratory data in the context of their chronic comorbidities is felt to place them at high risk for further clinical deterioration. Furthermore, it is not anticipated that the patient will be medically stable for discharge from the hospital within 2 midnights of admission. The following factors support the patient status of inpatient.   " The patient's presenting symptoms include abdominal pain, jaundice, nausea vomiting. " The worrisome physical exam findings include abdominal distention, jaundice, mild confusion. " The initial radiographic and laboratory data are worrisome because of ascites,  mild hyponatremia, hypokalemia, bilirubin of 19.8, leukocytosis to 11, hemoglobin stable at 8.2. " The chronic co-morbidities include protein CNS deficiency on chronic anticoagulation.  * I certify that at the point of admission it is my clinical judgment that the patient will require inpatient hospital care spanning beyond 2 midnights from the point of admission due to high intensity of service, high risk for further deterioration and high frequency of surveillance required.Synetta Fail MD [facility]  How to contact the [facility] Attending or Consulting provider 7A - 7P or covering provider during after hours 7P -7A, for this patient?   1. Check the care team in [facility] and look for a) attending/consulting [facility] provider listed and b) the [facility] team listed 2. Log into [email or web address] and use [place]'s universal password to access. If you do not have the password, please contact the hospital operator. 3. Locate the [facility] provider you are looking for under [facility] and page to a number that you can be directly reached. 4. If you still have difficulty reaching the provider, please page the [facility] (Director on Call) for the Hospitalists listed on amion for assistance.  11/12/2020, 12:39 AM

## 2020-11-11 NOTE — ED Notes (Signed)
Pt to CT

## 2020-11-12 ENCOUNTER — Other Ambulatory Visit: Payer: Self-pay

## 2020-11-12 ENCOUNTER — Inpatient Hospital Stay (HOSPITAL_COMMUNITY): Payer: BC Managed Care – PPO

## 2020-11-12 DIAGNOSIS — Z515 Encounter for palliative care: Secondary | ICD-10-CM

## 2020-11-12 DIAGNOSIS — N179 Acute kidney failure, unspecified: Secondary | ICD-10-CM

## 2020-11-12 DIAGNOSIS — Z7189 Other specified counseling: Secondary | ICD-10-CM

## 2020-11-12 DIAGNOSIS — R188 Other ascites: Secondary | ICD-10-CM

## 2020-11-12 DIAGNOSIS — E119 Type 2 diabetes mellitus without complications: Secondary | ICD-10-CM

## 2020-11-12 DIAGNOSIS — E876 Hypokalemia: Secondary | ICD-10-CM

## 2020-11-12 DIAGNOSIS — D6859 Other primary thrombophilia: Secondary | ICD-10-CM

## 2020-11-12 DIAGNOSIS — Z789 Other specified health status: Secondary | ICD-10-CM

## 2020-11-12 HISTORY — PX: IR PARACENTESIS: IMG2679

## 2020-11-12 LAB — LACTATE DEHYDROGENASE, PLEURAL OR PERITONEAL FLUID: LD, Fluid: 83 U/L — ABNORMAL HIGH (ref 3–23)

## 2020-11-12 LAB — COMPREHENSIVE METABOLIC PANEL
ALT: 39 U/L (ref 0–44)
AST: 112 U/L — ABNORMAL HIGH (ref 15–41)
Albumin: 2 g/dL — ABNORMAL LOW (ref 3.5–5.0)
Alkaline Phosphatase: 129 U/L — ABNORMAL HIGH (ref 38–126)
Anion gap: 15 (ref 5–15)
BUN: 23 mg/dL — ABNORMAL HIGH (ref 6–20)
CO2: 33 mmol/L — ABNORMAL HIGH (ref 22–32)
Calcium: 8.5 mg/dL — ABNORMAL LOW (ref 8.9–10.3)
Chloride: 80 mmol/L — ABNORMAL LOW (ref 98–111)
Creatinine, Ser: 1.39 mg/dL — ABNORMAL HIGH (ref 0.44–1.00)
GFR, Estimated: 47 mL/min — ABNORMAL LOW (ref >60)
Glucose, Bld: 90 mg/dL (ref 70–99)
Potassium: 3.7 mmol/L (ref 3.5–5.1)
Sodium: 128 mmol/L — ABNORMAL LOW (ref 135–145)
Total Bilirubin: 18.3 mg/dL (ref 0.3–1.2)
Total Protein: 7.6 g/dL (ref 6.5–8.1)

## 2020-11-12 LAB — CBC
HCT: 21.5 % — ABNORMAL LOW (ref 36.0–46.0)
Hemoglobin: 7.5 g/dL — ABNORMAL LOW (ref 12.0–15.0)
MCH: 48.7 pg — ABNORMAL HIGH (ref 26.0–34.0)
MCHC: 34.9 g/dL (ref 30.0–36.0)
MCV: 139.6 fL — ABNORMAL HIGH (ref 80.0–100.0)
Platelets: UNDETERMINED 10*3/uL (ref 150–400)
RBC: 1.54 MIL/uL — ABNORMAL LOW (ref 3.87–5.11)
RDW: 18.6 % — ABNORMAL HIGH (ref 11.5–15.5)
WBC: 14.6 10*3/uL — ABNORMAL HIGH (ref 4.0–10.5)
nRBC: 0.4 % — ABNORMAL HIGH (ref 0.0–0.2)

## 2020-11-12 LAB — HEPATITIS PANEL, ACUTE
HCV Ab: NONREACTIVE
Hep A IgM: NONREACTIVE
Hep B C IgM: NONREACTIVE
Hepatitis B Surface Ag: NONREACTIVE

## 2020-11-12 LAB — BODY FLUID CELL COUNT WITH DIFFERENTIAL
Eos, Fluid: 0 %
Lymphs, Fluid: 36 %
Monocyte-Macrophage-Serous Fluid: 32 % — ABNORMAL LOW (ref 50–90)
Neutrophil Count, Fluid: 32 % — ABNORMAL HIGH (ref 0–25)
Total Nucleated Cell Count, Fluid: 61 cu mm (ref 0–1000)

## 2020-11-12 LAB — GLUCOSE, CAPILLARY
Glucose-Capillary: 86 mg/dL (ref 70–99)
Glucose-Capillary: 91 mg/dL (ref 70–99)
Glucose-Capillary: 87 mg/dL (ref 70–99)
Glucose-Capillary: 115 mg/dL — ABNORMAL HIGH (ref 70–99)

## 2020-11-12 LAB — GLUCOSE, PLEURAL OR PERITONEAL FLUID: Glucose, Fluid: 82 mg/dL

## 2020-11-12 LAB — IRON AND TIBC: Iron: 87 ug/dL (ref 28–170)

## 2020-11-12 LAB — OSMOLALITY: Osmolality: 293 mOsm/kg (ref 275–295)

## 2020-11-12 LAB — PROTIME-INR
INR: 2.1 — ABNORMAL HIGH (ref 0.8–1.2)
Prothrombin Time: 22.4 seconds — ABNORMAL HIGH (ref 11.4–15.2)

## 2020-11-12 LAB — GRAM STAIN

## 2020-11-12 LAB — AMMONIA: Ammonia: 36 umol/L — ABNORMAL HIGH (ref 9–35)

## 2020-11-12 LAB — HIV ANTIBODY (ROUTINE TESTING W REFLEX): HIV Screen 4th Generation wRfx: NONREACTIVE

## 2020-11-12 LAB — FERRITIN: Ferritin: 1167 ng/mL — ABNORMAL HIGH (ref 11–307)

## 2020-11-12 LAB — MAGNESIUM: Magnesium: 2.5 mg/dL — ABNORMAL HIGH (ref 1.7–2.4)

## 2020-11-12 LAB — PROTEIN, PLEURAL OR PERITONEAL FLUID: Total protein, fluid: <3 g/dL

## 2020-11-12 LAB — LACTATE DEHYDROGENASE: LDH: 277 U/L — ABNORMAL HIGH (ref 98–192)

## 2020-11-12 MED ORDER — ADULT MULTIVITAMIN W/MINERALS CH
1.0000 | ORAL_TABLET | Freq: Every day | ORAL | Status: DC
  Administered 2020-11-12 – 2020-11-17 (×6): 1 via ORAL
  Filled 2020-11-12 (×7): qty 1

## 2020-11-12 MED ORDER — INFLUENZA VAC SPLIT QUAD 0.5 ML IM SUSY
0.5000 mL | PREFILLED_SYRINGE | INTRAMUSCULAR | Status: AC
  Administered 2020-11-15: 0.5 mL via INTRAMUSCULAR
  Filled 2020-11-12 (×2): qty 0.5

## 2020-11-12 MED ORDER — ALBUMIN HUMAN 25 % IV SOLN
50.0000 g | Freq: Four times a day (QID) | INTRAVENOUS | Status: AC
  Administered 2020-11-12 (×2): 50 g via INTRAVENOUS
  Filled 2020-11-12: qty 200

## 2020-11-12 MED ORDER — RIVAROXABAN 20 MG PO TABS: 20.0000 mg | ORAL_TABLET | Freq: Every day | ORAL | Status: DC

## 2020-11-12 MED ORDER — LIDOCAINE HCL 1 % IJ SOLN
INTRAMUSCULAR | Status: AC
  Filled 2020-11-12: qty 20

## 2020-11-12 MED ORDER — LACTULOSE 10 GM/15ML PO SOLN
30.0000 g | Freq: Two times a day (BID) | ORAL | Status: DC
  Administered 2020-11-12 – 2020-11-13 (×3): 30 g via ORAL
  Filled 2020-11-12 (×3): qty 45

## 2020-11-12 MED ORDER — PNEUMOCOCCAL VAC POLYVALENT 25 MCG/0.5ML IJ INJ
0.5000 mL | INJECTION | INTRAMUSCULAR | Status: AC
  Administered 2020-11-15: 0.5 mL via INTRAMUSCULAR
  Filled 2020-11-12 (×2): qty 0.5

## 2020-11-12 MED ORDER — INSULIN ASPART 100 UNIT/ML ~~LOC~~ SOLN: 0.0000 [IU] | Freq: Three times a day (TID) | SUBCUTANEOUS | Status: DC

## 2020-11-12 MED ORDER — POTASSIUM CHLORIDE 10 MEQ/100ML IV SOLN
10.0000 meq | INTRAVENOUS | Status: AC
  Administered 2020-11-12 (×2): 10 meq via INTRAVENOUS
  Filled 2020-11-12 (×2): qty 100

## 2020-11-12 MED ORDER — ENSURE ENLIVE PO LIQD
237.0000 mL | Freq: Three times a day (TID) | ORAL | Status: DC
  Administered 2020-11-13 – 2020-11-16 (×8): 237 mL via ORAL
  Filled 2020-11-12 (×3): qty 237

## 2020-11-12 NOTE — Consult Note (Addendum)
[facility] Consultation Note  Referring Provider: [facility] Primary Care Physician:  Pcp, No Primary Gastroenterologist: Gentry Fitz  Reason for Consultation: Decompensated cirrhosis  HPI: Shelby Conner is a 49 y.o. female with past medical history of protein C and S deficiency and fatty liver disease presenting for consultation of decompensated cirrhosis.  Patient presented to the ED yesterday due to abdominal pain and jaundice.  She states she has been noting abdominal pain and distention for the last 2 to 3 weeks.  Her family noticed she started having yellowing of the skin and eyes approximately 1 week ago.  Patient reports nausea but no vomiting.  She has felt short of breath as her abdominal distention has increased.  She also notes extreme fatigue.  Denies any melena or hematochezia.  Patient denies any prior episodes similar to this.  Patient denies recent Tylenol use, states she only takes it infrequently as needed.  Denies frequent alcohol use.  Last drink was over 1 month ago.  Only drinks socially, though notes ~1 drink daily for approximately 1 month while her mother-in-law was visiting, as they share drinks together; however, no history of excessive alcohol use.  Denies any IV drug use, blood transfusions, or or blood exposures.  Denies any new medication changes.  She is on Xarelto for hypercoagulability (Protein C and S deficiency). States last dose was "a few days ago."  States her father has some sort of liver disease, though she is unsure of the exact diagnosis.  Past Medical History:  Diagnosis Date  . Diabetes mellitus without complication (HCC)   . Fatty liver   . Hyperlipidemia   . Protein C deficiency (HCC)   . Protein S deficiency [facility])     Past Surgical History:  Procedure Laterality Date  . ANKLE SURGERY Right   . CESAREAN SECTION    . KNEE SURGERY Left     Prior to Admission medications   Medication Sig Start Date End Date Taking?  Authorizing Provider  ezetimibe (ZETIA) 10 MG tablet Take 10 mg by mouth daily. 09/01/20  Yes [provider]  metFORMIN (GLUCOPHAGE) 500 MG tablet Take 500 mg by mouth 2 (two) times daily. 06/08/20  Yes [provider]  rosuvastatin (CRESTOR) 40 MG tablet Take 40 mg by mouth daily. 09/01/20  Yes [provider]  XARELTO 20 MG TABS tablet Take 20 mg by mouth at bedtime. 09/01/20  Yes [provider]    Current Facility-Administered Medications  Medication Dose Route Frequency Provider Last Rate Last Admin  . cefTRIAXone (ROCEPHIN) 2 g in sodium chloride 0.9 % 100 mL IVPB  2 g Intravenous Q24H Synetta Fail, MD 200 mL/hr at 11/12/20 0107 2 g at 11/12/20 0107  . HYDROmorphone (DILAUDID) injection 0.5 mg  0.5 mg Intravenous Q6H PRN Synetta Fail, MD      . Melene Muller ON 11/13/2020] influenza vac split quadrivalent PF (FLUARIX) injection 0.5 mL  0.5 mL Intramuscular Tomorrow-1000 Synetta Fail, MD      . insulin aspart (novoLOG) injection 0-9 Units  0-9 Units Subcutaneous TID WC Synetta Fail, MD      . lactulose (CHRONULAC) 10 GM/15ML solution 20 g  20 g Oral BID Synetta Fail, MD   20 g at 11/12/20 0105  . [START ON 11/13/2020] pneumococcal 23 valent vaccine (PNEUMOVAX-23) injection 0.5 mL  0.5 mL Intramuscular Tomorrow-1000 Synetta Fail, MD      . rivaroxaban Carlena Hurl) tablet 20 mg  20 mg Oral Q supper Beola Cord  B, MD      . sodium chloride flush (NS) 0.9 % injection 3 mL  3 mL Intravenous Q12H Synetta Fail, MD   3 mL at 11/11/20 2223    Allergies as of 11/11/2020  . (No Known Allergies)    History reviewed. No pertinent family history.  Social History   Socioeconomic History  . Marital status: Divorced    Spouse name: Not on file  . Number of children: Not on file  . Years of education: Not on file  . Highest education level: Not on file  Occupational History  . Not on file  Tobacco Use  . Smoking status:  Former Games developer  . Smokeless tobacco: Never Used  Vaping Use  . Vaping Use: Never used  Substance and Sexual Activity  . Alcohol use: Yes    Comment: occasionally  . Drug use: Never  . Sexual activity: Not on file  Other Topics Concern  . Not on file  Social History Narrative  . Not on file   Social Determinants of Health   Financial Resource Strain:   . Difficulty of Paying Living Expenses: Not on file  Food Insecurity:   . Worried About Programme researcher, broadcasting/film/video in the Last Year: Not on file  . Ran Out of Food in the Last Year: Not on file  Transportation Needs:   . Lack of Transportation (Medical): Not on file  . Lack of Transportation (Non-Medical): Not on file  Physical Activity:   . Days of Exercise per Week: Not on file  . Minutes of Exercise per Session: Not on file  Stress:   . Feeling of Stress : Not on file  Social Connections:   . Frequency of Communication with Friends and Family: Not on file  . Frequency of Social Gatherings with Friends and Family: Not on file  . Attends Religious Services: Not on file  . Active Member of Clubs or Organizations: Not on file  . Attends Banker Meetings: Not on file  . Marital Status: Not on file  Intimate Partner Violence:   . Fear of Current or Ex-Partner: Not on file  . Emotionally Abused: Not on file  . Physically Abused: Not on file  . Sexually Abused: Not on file    Review of Systems: Review of Systems  Constitutional: Positive for malaise/fatigue. Negative for chills, fever and weight loss.  HENT: Negative for hearing loss and tinnitus.   Eyes: Negative for pain and redness.  Respiratory: Positive for shortness of breath. Negative for cough.   Cardiovascular: Negative for chest pain and palpitations.  Gastrointestinal: Positive for abdominal pain and nausea. Negative for blood in stool, constipation, diarrhea, heartburn, melena and vomiting.  Genitourinary: Negative for flank pain and hematuria.   Musculoskeletal: Negative for falls and joint pain.  Skin: Negative for itching and rash.       +yellowing  Neurological: Negative for seizures and loss of consciousness.  Endo/Heme/Allergies: Negative for polydipsia. Does not bruise/bleed easily.  Psychiatric/Behavioral: Negative for substance abuse. The patient is not nervous/anxious.     Physical Exam: Vital signs in last 24 hours: Temp:  [97.7 F (36.5 C)-98.7 F (37.1 C)] 97.7 F (36.5 C) (11/19 0810) Pulse Rate:  [78-105] 87 (11/19 0810) Resp:  [11-21] 15 (11/19 0810) BP: (92-135)/(58-82) 93/60 (11/19 0810) SpO2:  [73 %-96 %] 91 % (11/19 0810) Weight:  [74.3 kg] 74.3 kg (11/19 0002) Last BM Date: 11/09/20  Physical Exam Vitals reviewed.  Constitutional:  General: She is not in acute distress.    Appearance: She is ill-appearing.     Interventions: Nasal cannula in place.  HENT:     Head: Normocephalic and atraumatic.     Nose: Nose normal. No congestion.     Mouth/Throat:     Mouth: Mucous membranes are moist.     Pharynx: Oropharynx is clear.  Eyes:     General: Scleral icterus present.     Extraocular Movements: Extraocular movements intact.  Cardiovascular:     Rate and Rhythm: Normal rate and regular rhythm.     Pulses: Normal pulses.  Pulmonary:     Effort: Pulmonary effort is normal. No respiratory distress.     Breath sounds: Normal breath sounds.  Abdominal:     General: Bowel sounds are normal. There is distension.     Palpations: Abdomen is soft. There is no mass.     Tenderness: There is abdominal tenderness. There is no guarding or rebound.     Hernia: No hernia is present.  Musculoskeletal:        General: No tenderness.     Cervical back: Normal range of motion and neck supple.     Right lower leg: No edema.     Left lower leg: No edema.  Skin:    General: Skin is warm and dry.     Coloration: Skin is jaundiced.  Neurological:     General: No focal deficit present.     Mental Status:  She is oriented to person, place, and time. She is lethargic.     Comments: +asterixis  Psychiatric:        Mood and Affect: Mood normal.        Behavior: Behavior normal. Behavior is cooperative.      Lab Results: Recent Labs    11/11/20 1612 11/12/20 0243  WBC 17.5* 14.6*  HGB 8.2* 7.5*  HCT 23.8* 21.5*  PLT 108* PLATELET CLUMPS NOTED ON SMEAR, UNABLE TO ESTIMATE   BMET Recent Labs    11/11/20 1612 11/12/20 0243  NA 128* 128*  K 2.4* 3.7  CL 76* 80*  CO2 36* 33*  GLUCOSE 90 90  BUN 20 23*  CREATININE 1.32* 1.39*  CALCIUM 8.9 8.5*   LFT Recent Labs    11/12/20 0243  PROT 7.6  ALBUMIN 2.0*  AST 112*  ALT 39  ALKPHOS 129*  BILITOT 18.3*   PT/INR Recent Labs    11/11/20 2123 11/12/20 0243  LABPROT 23.0* 22.4*  INR 2.1* 2.1*    Studies/Results: CT ABDOMEN PELVIS W CONTRAST  Result Date: 11/11/2020 CLINICAL DATA:  49 year old female with abdominal pain. EXAM: CT ABDOMEN AND PELVIS WITH CONTRAST TECHNIQUE: Multidetector CT imaging of the abdomen and pelvis was performed using the standard protocol following bolus administration of intravenous contrast. CONTRAST:  100mL OMNIPAQUE IOHEXOL 300 MG/ML  SOLN COMPARISON:  None. FINDINGS: Lower chest: The visualized lung bases are clear. No intra-abdominal free air. Small ascites. Hepatobiliary: There is severe fatty infiltration of the liver with surface irregularity consistent with changes of cirrhosis. The liver is enlarged measuring 19 cm in midclavicular length. Small gallstones. No pericholecystic fluid. Pancreas: Unremarkable. No pancreatic ductal dilatation or surrounding inflammatory changes. Spleen: Mildly enlarged spleen measuring approximately 14 cm in greatest length. Adrenals/Urinary Tract: The adrenal glands unremarkable. There is no hydronephrosis on either side. The visualized ureters and urinary bladder appear unremarkable. Stomach/Bowel: Diffuse thickened appearance of the colon, likely related to  underdistention and hepatic colopathy. Clinical correlation is  recommended to exclude colitis. There is no bowel obstruction. The appendix is not identified with certainty. Vascular/Lymphatic: Mild aortoiliac atherosclerotic disease. The IVC is unremarkable. An infrarenal IVC filter is noted. There is a left common iliac vein stent which is poorly evaluated on this CT due to suboptimal opacification. No portal venous gas. Upper abdominal varices including paraesophageal and gastric varices. There is no adenopathy. Reproductive: The uterus is anteverted and grossly unremarkable. No adnexal masses. Other: Mild subcutaneous edema. 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 No acute fracture or cortical collapse. IMPRESSION: 1. Enlarged cirrhotic liver with severe fatty infiltration and evidence of portal hypertension including splenomegaly, ascites, and upper abdominal varices. 2. Cholelithiasis. 3. Underdistention of the colon versus hepatic colopathy. Clinical correlation is recommended to exclude colitis. No bowel obstruction. 4. 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 No acute fracture or cortical collapse. 5. Aortic Atherosclerosis (ICD10-I70.0). Electronically Signed   By: Elgie Collard M.D.   On: 11/11/2020 19:57   US Abdomen Limited RUQ (LIVER/GB)  Result Date: 11/11/2020 CLINICAL DATA:  Elevated LFTs EXAM: ULTRASOUND ABDOMEN LIMITED RIGHT UPPER QUADRANT COMPARISON:  None. FINDINGS: Gallbladder: No gallstones or wall thickening visualized. No sonographic Murphy sign noted by sonographer. Common bile duct: Diameter: 2 mm, nondilated Liver: Diffusely increased hepatic echogenicity with a slightly nodular hepatic surface contour. No focal liver lesion is seen though evaluation limited by diminished through transmission of the liver. No visible intrahepatic biliary ductal dilatation. Portal vein is patent on color Doppler imaging with normal  direction of blood flow towards the liver. Other: Extensive abdominal bowel gas. Abdominal ascites seen in all 4 quadrants. IMPRESSION: 1. Diffusely increased hepatic echogenicity with diminished through transmission as well as a nodular hepatic surface contour. Findings are somewhat nonspecific but can be seen in the setting of hepatic steatosis and intrinsic liver disease/cirrhosis. 2. Abdominal ascites is seen in all 4 quadrants. Electronically Signed   By: Kreg Shropshire M.D.   On: 11/11/2020 20:22    Impression: Decompensated cirrhosis, unknown etiology.  MELD score of 32 as of 11/12/20. -T bili 18.3/AST 112/ALT 39/ALP 129 -INR 2.1 -WBCs 14.6, decreased from 7.5 yesterday -Acute hepatitis panel negative 11/11/2020  AKI: BUN 23/Cr 1.39  Protein C and S deficiency, on Xarelto  Plan: Diagnostic and therapeutic paracentesis today.  Increase lactulose to 30g twice daily, titrate for 2-3 soft bowel movements per day.  Continue to trend LFTs.  Additional testing ordered to rule out additional causes of cirrhosis: ASMA, AMA/ANA, ceruloplasmin, ferritin/iron panel, AFP, and alpha-1 antitrypsin ordered.  Continue supportive care.  Continue empiric antibiotics.  [facility] will follow.   LOS: 1 day   Edrick Kins  11/12/2020, 8:28 AM  Cell [phone] If no answer or after 5 PM call [phone]

## 2020-11-12 NOTE — Procedures (Signed)
PROCEDURE SUMMARY:  Successful US guided paracentesis from right lateral abdomen.  Yielded 2.5 L of dark yellow fluid.  No immediate complications.  Pt tolerated well.   Specimen sent for labs.  EBL < 2 mL  Mickie Kay, NP 11/12/2020 10:14 AM

## 2020-11-12 NOTE — Consult Note (Signed)
Palliative Medicine Inpatient Consult Note  Reason for consult: Goals of Care "Goals of Care Clarity"   HPI:  Per intake H&P --> Shelby Conner is a 49 y.o. female with medical history significant of fatty liver, diabetes, protein C and S deficiency presents with progressive abdominal pain for the past 1 to 2 weeks.  Patient states that she has progressive abdominal pain for the past 1 to 2 weeks.  She is also noted progressive jaundice and abdominal distention for the past week.  She reports nausea vomiting.  She reports some mild shortness of breath and chest pain that appear to be related to inability to take deep breaths because of abdominal pain.  She denies fevers, constipation, diarrhea.  She states that she is an occasional drinker but has no significant alcohol use beyond that.  She does not use significant acetaminophen.  Does have a history of fatty liver which was diagnosed 6 years ago per her report.  Palliative care was asked to get involved in the setting of a cirrhosis diagnosis.  We were asked to speak with the patient and her husband to identify whether or not there was enough social support to consider transplantation.  Clinical Assessment/Goals of Care: I have reviewed medical records including EPIC notes, labs and imaging, received report from bedside RN, assessed the patient who stated that she was feeling more confused today.    I met with Shelby Conner and her husband Shelby Conner to further discuss diagnosis prognosis, GOC, EOL wishes, disposition and options.   I introduced Palliative Medicine as specialized medical care for people living with serious illness. It focuses on providing relief from the symptoms and stress of a serious illness. The goal is to improve quality of life for both the patient and the family.  Shelby Conner is from [place] originally.  She moved down to [place] over a decade ago when her mother was ill.  She has been married to her husband Shelby Conner  for over 11 years.  She has 2 children and 69 and a 26 year old.  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  She does not consider herself to be overtly religious.  Prior to hospitalization patient was fully functional able to complete all the ADLs independently.  A detailed discussion was had today regarding advanced directives  - Quentin has never completed these but will be interested in doing so while hospitalized.  She and her husband Shelby Conner share that they could do these over the weekend.    Concepts specific to code status, artifical feeding and hydration, continued IV antibiotics and rehospitalization was had.  Patient shares that she would want all measures completed to maintain inclusive of cardiopulmonary resuscitation and intubation.  We discussed focusing on 3 things while she is in the hospital: The first being taking the medication scribed to help her elevated ammonia levels.  The second being allowing the gastroenterology team to review the labs that have been sent.  The third working with the gastroenterology team to figure out the next steps of which should be done.  We talked about medication compliance and the importance of that.  We also discussed whether or not if Hetty were a candidate for transplantation if she would want to pursue that.  She shares that she does as she wants to live for her children.  Discussed the importance of continued conversation with family and their  medical providers regarding overall plan of care and treatment options,  ensuring decisions are within the context of the patients values and GOCs.  Decision Maker: Shelby Conner (spouse) [phone]  SUMMARY OF RECOMMENDATIONS   Full Code / Full Scope of Treatment  Patient would be interested in liver transplant if offered  Appreciate the hepatology team calling the patient's husband directly to provide him a medical update from their perspective  Advance  care planning documents and a MOST form provided to patient and her husband for completion over the weekend  Ongoing PMT Support  Code Status/Advance Care Planning: FULL CODE    Palliative Prophylaxis:   Oral Care, Mobility, Delirium Precuations  Additional Recommendations (Limitations, Scope, Preferences):  Continue Current Scope of Care   Psycho-social/Spiritual:   Desire for further Chaplaincy support: No  Additional Recommendations: Education on liver cirrhosis   Prognosis: High MELD score which is worrisome.   Discharge Planning: Unclear  Vitals:   11/12/20 0810 11/12/20 1019  BP: 93/60 96/62  Pulse: 87 90  Resp: 15 15  Temp: 97.7 F (36.5 C) 98.8 F (37.1 C)  SpO2: 91% (!) 88%    Intake/Output Summary (Last 24 hours) at 11/12/2020 1241 Last data filed at 11/12/2020 0116 Gross per 24 hour  Intake 1250 ml  Output 200 ml  Net 1050 ml   Last Weight  Most recent update: 11/12/2020 12:03 AM   Weight  74.3 kg (163 lb 12.8 oz)           Gen:  F in NAD HEENT: Yellow sclera, moist mucous membranes CV: Regular rate and rhythm  PULM: 3LPM [place], clear to auscultation bilaterally  ABD: Slightly distended EXT: No edema  Neuro: Alert and oriented x3 - Patient shares that she feels very confused  PPS: 50%   This conversation/these recommendations were discussed with patient primary care team, Dr. Posey Pronto  Time In: 1400 Time Out: 1510 Total Time: 70 Greater than 50%  of this time was spent counseling and coordinating care related to the above assessment and plan.  [place] Team Team Cell Phone: [phone] Please utilize secure chat with additional questions, if there is no response within 30 minutes please call the above phone number  Palliative Medicine Team providers are available by phone from 7am to 7pm daily and can be reached through the team cell phone.  Should this patient require assistance outside of these  hours, please call the patient's attending physician.

## 2020-11-12 NOTE — Discharge Instructions (Signed)
Information on my medicine - XARELTO® (rivaroxaban) ° °This medication education was reviewed with me or my healthcare representative as part of my discharge preparation.   ° °WHY WAS XARELTO® PRESCRIBED FOR YOU? °Xarelto® was prescribed to treat blood clots that may have been found in the veins of your legs (deep vein thrombosis) or in your lungs (pulmonary embolism) and to reduce the risk of them occurring again. ° °What do you need to know about Xarelto®? ° The dose is one 20 mg tablet taken ONCE A DAY with your evening meal. ° °DO NOT stop taking Xarelto® without talking to the health care provider who prescribed the medication.  Refill your prescription for 20 mg tablets before you run out. ° °After discharge, you should have regular check-up appointments with your healthcare provider that is prescribing your Xarelto®.  In the future your dose may need to be changed if your kidney function changes by a significant amount. ° °What do you do if you miss a dose? ° °If you are taking Xarelto® ONCE DAILY and you miss a dose, take it as soon as you remember on the same day then continue your regularly scheduled once daily regimen the next day. Do not take two doses of Xarelto® at the same time.  ° °Important Safety Information °Xarelto® is a blood thinner medicine that can cause bleeding. You should call your healthcare provider right away if you experience any of the following: °? Bleeding from an injury or your nose that does not stop. °? Unusual colored urine (red or dark brown) or unusual colored stools (red or black). °? Unusual bruising for unknown reasons. °? A serious fall or if you hit your head (even if there is no bleeding). ° °Some medicines may interact with Xarelto® and might increase your risk of bleeding while on Xarelto®. To help avoid this, consult your healthcare provider or pharmacist prior to using any new prescription or non-prescription medications, including herbals, vitamins, non-steroidal  anti-inflammatory drugs (NSAIDs) and supplements. ° °This website has more information on Xarelto®: www.xarelto.com. ° °  °

## 2020-11-12 NOTE — Progress Notes (Signed)
15-minute telephone conversation with patient's husband, Shelby Conner [phone]) regarding the patient's current condition, my concerns about worsening renal function, and the fact that, although her prognosis is uncertain, it appears to be quite guarded.    She clearly has severe liver disease, and I explained that our ability to quantitate it (via MELD score) will be improved in the next day or 2 once the Eliquis is out of her system and we can rely on the validity of her INR value, which goes into the MELD score.  Also, over the next few days, we should have a better idea of the patient's clinical trajectory, particularly with respect to her renal function, and should thus be able to offer a clearer prognosis.  However, I explained that at the moment, I think she has probably a roughly 50% chance of mortality in the near future, meaning the next several months.  The patient's husband indicates that she does have a history of significant alcohol usage, primarily wine, although less so in recent years.  Shelby Conner was very appreciative for my time.  He would appreciate being kept up to date, particularly if there are any changes.  Florencia Reasons, M.D. Pager [phone] If no answer or after 5 PM call [phone]

## 2020-11-12 NOTE — Progress Notes (Addendum)
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  Presents with progressively worsening abdominal pain.  Found to have acute on chronic liver disease rapidly progressing to liver failure. GI consulted. At the request of the GI palliative care as well as nephrology were also considered. SP paracentesis. Currently plan is continue current care under GI guidance.  Assessment and Plan: 1.  SBP suspected Acute on chronic liver disease with decompensation  Significantly high although INR can be elevated secondary to being on anticoagulation. Appreciate GI consultation. Currently initiating further work-up. SP 2.5 L of paracentesis. On IV ceftriaxone. Follow-up on cultures. Continue lactulose. Avoid ototoxic medication. IV albumin 50 g for 2 doses.  Due to hypotension.  2.  Acute kidney injury. Hypokalemia. Etiology not clear.  At risk for hepatorenal syndrome.  Further work-up initiated.  Nephrology consult.  Will monitor response.  3.  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 Currently holding. May require initiation of IV heparin.  4.  Type 2 diabetes mellitus On Metformin at home. Check hemoglobin A1c. On sliding scale insulin.  5.  Goals of care. Appreciate palliative care assistance. Continue full code. Patient agreeable for liver transplant if offered.   Body mass index is 30.95 kg/m.  Nutrition Problem: Increased nutrient needs Etiology: chronic illness (cirrhosis) Interventions: Interventions: Ensure Enlive (each supplement provides 350kcal and 20 grams of protein), MVI      Diet: Low-sodium diet DVT Prophylaxis:       Advance goals of care discussion: Full code  Family Communication: no family was present at  bedside, at the time of interview.   Disposition:  Status is: Inpatient  Remains inpatient appropriate because:IV treatments appropriate due to intensity of illness or inability to take PO   Dispo: The patient is from: Home              Anticipated d/c is to: Home              Anticipated d/c date is: 3 days              Patient currently is not medically stable to d/c.        Subjective: Continues to have severe abdominal pain.  No nausea no vomiting.  No fever no chills.  Also reports shortness of breath with dry cough.  No fever no chills.  Physical Exam:  General: Appear in mild distress, no Rash; Oral Mucosa Clear, moist. no Abnormal Neck Mass Or lumps, Conjunctiva normal  Cardiovascular: S1 and S2 Present, no Murmur, Respiratory: good respiratory effort, Bilateral Air entry present and CTA, no Crackles, no wheezes Abdomen: Bowel Sound present, Soft and mild tenderness Extremities: no Pedal edema Neurology: alert and oriented to time, place, and person affect appropriate. no new focal deficit Gait not checked due to patient safety concerns  Vitals:   11/12/20 0439 11/12/20 0810 11/12/20 1019 11/12/20 1514  BP: 97/68 93/60 96/62  (!) 91/56  Pulse: 83 87 90 86  Resp: 16 15 15 16   Temp: 97.9 F (36.6 C) 97.7 F (36.5 C) 98.8 F (37.1 C) 97.7 F (36.5 C)  TempSrc:  Oral Oral Oral  SpO2: (!) 89% 91% (!) 88% 95%  Weight:      Height:        Intake/Output Summary (Last 24  hours) at 11/12/2020 1908 Last data filed at 11/12/2020 0116 Gross per 24 hour  Intake 1250 ml  Output 200 ml  Net 1050 ml   Filed Weights   11/12/20 0002  Weight: 74.3 kg    Data Reviewed: I have personally reviewed and interpreted daily labs, tele strips, imagings as discussed above. I reviewed all nursing notes, pharmacy notes, vitals, pertinent old records I have discussed plan of care as described above with RN and patient/family.  CBC: Recent Labs  Lab 11/11/20 1612  11/12/20 0243  WBC 17.5* 14.6*  HGB 8.2* 7.5*  HCT 23.8* 21.5*  MCV 139.2* 139.6*  PLT 108* PLATELET CLUMPS NOTED ON SMEAR, UNABLE TO ESTIMATE   Basic Metabolic Panel: Recent Labs  Lab 11/11/20 1612 11/12/20 0243  NA 128* 128*  K 2.4* 3.7  CL 76* 80*  CO2 36* 33*  GLUCOSE 90 90  BUN 20 23*  CREATININE 1.32* 1.39*  CALCIUM 8.9 8.5*  MG  --  2.5*    Studies: CT ABDOMEN PELVIS W CONTRAST  Result Date: 11/11/2020 CLINICAL DATA:  48 year old female with abdominal pain. EXAM: CT ABDOMEN AND PELVIS WITH CONTRAST TECHNIQUE: Multidetector CT imaging of the abdomen and pelvis was performed using the standard protocol following bolus administration of intravenous contrast. CONTRAST:  OMNIPAQUE IOHEXOL 300 MG/ML  SOLN COMPARISON:  None. FINDINGS: Lower chest: The visualized lung bases are clear. No intra-abdominal free air. Small ascites. Hepatobiliary: There is severe fatty infiltration of the liver with surface irregularity consistent with changes of cirrhosis. The liver is enlarged measuring 19 cm in midclavicular length. Small gallstones. No pericholecystic fluid. Pancreas: Unremarkable. No pancreatic ductal dilatation or surrounding inflammatory changes. Spleen: Mildly enlarged spleen measuring approximately 14 cm in greatest length. Adrenals/Urinary Tract: The adrenal glands unremarkable. There is no hydronephrosis on either side. The visualized ureters and urinary bladder appear unremarkable. Stomach/Bowel: Diffuse thickened appearance of the colon, likely related to underdistention and hepatic colopathy. Clinical correlation is recommended to exclude colitis. There is no bowel obstruction. The appendix is not identified with certainty. Vascular/Lymphatic: Mild aortoiliac atherosclerotic disease. The IVC is unremarkable. An infrarenal IVC filter is noted. There is a left common iliac vein stent which is poorly evaluated on this CT due to suboptimal opacification. No portal venous gas.  Upper abdominal varices including paraesophageal and gastric varices. There is no adenopathy. Reproductive: The uterus is anteverted and grossly unremarkable. No adnexal masses. Other: Mild subcutaneous edema. 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 No acute fracture or cortical collapse. IMPRESSION: 1. Enlarged cirrhotic liver with severe fatty infiltration and evidence of portal hypertension including splenomegaly, ascites, and upper abdominal varices. 2. Cholelithiasis. 3. Underdistention of the colon versus hepatic colopathy. Clinical correlation is recommended to exclude colitis. No bowel obstruction. 4. 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 No acute fracture or cortical collapse. 5. Aortic Atherosclerosis (ICD10-I70.0). Electronically Signed   By: Elgie Collard M.D.   On: 11/11/2020 19:57   US LIVER DOPPLER  Result Date: 11/12/2020 CLINICAL DATA:  49 year old with elevated LFTs. EXAM: DUPLEX ULTRASOUND OF LIVER TECHNIQUE: Color and duplex Doppler ultrasound was performed to evaluate the hepatic in-flow and out-flow vessels. COMPARISON:  CT abdomen 11/11/2020. right upper quadrant ultrasound 11/11/2020 FINDINGS: Liver: Diffusely echogenic with poor visualization of the internal architecture. Main Portal Vein size: 0.9 cm Portal Vein Velocities Main Prox:  15 cm/sec Main Mid: Not visualized Main Dist: Not visualized Right: Not visualized Left: 12 cm/sec Hepatic Vein  Velocities Right: Not visualized Middle: Not visualized Left: Not visualized IVC: Present and patent with normal respiratory phasicity. Hepatic Artery Velocity: Not visualized Splenic Vein Velocity:  15 cm/sec Spleen: 7.6 cm x 15.3 cm x 16.3 cm with a total volume of 994 cm^3 (411 cm^3 is upper limit normal) Portal Vein Occlusion/Thrombus: No Splenic Vein Occlusion/Thrombus: No Ascites: Present Varices: Known esophageal varices not visualized. Markedly limited examination.  Hepatopetal flow in the proximal portal vein. IMPRESSION: 1. Markedly limited evaluation of the hepatic vessels related to poor penetration through the liver. Liver is diffusely echogenic and limited evaluation of the internal architecture. Findings probably related to steatosis. 2. Proximal portal vein is patent with hepatopetal flow. Portal veins are patent but small on the recent abdominal CT. Hepatic arteries are patent on the recent CT. Hepatic veins are small but patent on the recent CT. 3. Splenomegaly with ascites. Findings are compatible with portal hypertension. Patient has known esophageal varices on CT that are not visualized on this examination. Electronically Signed   By: Richarda Overlie M.D.   On: 11/12/2020 08:33   US Abdomen Limited RUQ (LIVER/GB)  Result Date: 11/11/2020 CLINICAL DATA:  Elevated LFTs EXAM: ULTRASOUND ABDOMEN LIMITED RIGHT UPPER QUADRANT COMPARISON:  None. FINDINGS: Gallbladder: No gallstones or wall thickening visualized. No sonographic Murphy sign noted by sonographer. Common bile duct: Diameter: 2 mm, nondilated Liver: Diffusely increased hepatic echogenicity with a slightly nodular hepatic surface contour. No focal liver lesion is seen though evaluation limited by diminished through transmission of the liver. No visible intrahepatic biliary ductal dilatation. Portal vein is patent on color Doppler imaging with normal direction of blood flow towards the liver. Other: Extensive abdominal bowel gas. Abdominal ascites seen in all 4 quadrants. IMPRESSION: 1. Diffusely increased hepatic echogenicity with diminished through transmission as well as a nodular hepatic surface contour. Findings are somewhat nonspecific but can be seen in the setting of hepatic steatosis and intrinsic liver disease/cirrhosis. 2. Abdominal ascites is seen in all 4 quadrants. Electronically Signed   By: Kreg Shropshire M.D.   On: 11/11/2020 20:22   IR Paracentesis  Result Date: 11/12/2020 INDICATION:  Patient admitted for decompensated cirrhosis and new onset ascites. Interventional radiology asked to perform a therapeutic and diagnostic paracentesis. EXAM: ULTRASOUND GUIDED PARACENTESIS MEDICATIONS: 1% lidocaine 20 mL COMPLICATIONS: None immediate. PROCEDURE: Informed written consent was obtained from the patient after a discussion of the risks, benefits and alternatives to treatment. A timeout was performed prior to the initiation of the procedure. Initial ultrasound scanning demonstrates a large amount of ascites within the right lower abdominal quadrant. The right lower abdomen was prepped and draped in the usual sterile fashion. 1% lidocaine was used for local anesthesia. Following this, a 19 gauge, 7-cm, Yueh catheter was introduced. An ultrasound image was saved for documentation purposes. The paracentesis was performed. The catheter was removed and a dressing was applied. The patient tolerated the procedure well without immediate post procedural complication. FINDINGS: A total of approximately 2.5 L of dark yellow fluid was removed. Samples were sent to the laboratory as requested by the clinical team. IMPRESSION: Successful ultrasound-guided paracentesis yielding 2.5 liters of peritoneal fluid. Read by: Alwyn Ren, NP Electronically Signed   By: Richarda Overlie M.D.   On: 11/12/2020 10:13    Scheduled Meds: . feeding supplement  237 mL Oral TID BM  . [START ON 11/13/2020] influenza vac split quadrivalent PF  0.5 mL Intramuscular Tomorrow-1000  . insulin aspart  0-9 Units Subcutaneous TID WC  .  lactulose  30 g Oral BID  . lidocaine      . multivitamin with minerals  1 tablet Oral Daily  . [START ON 11/13/2020] pneumococcal 23 valent vaccine  0.5 mL Intramuscular Tomorrow-1000  . sodium chloride flush  3 mL Intravenous Q12H   Continuous Infusions: . albumin human 50 g (11/12/20 1811)  . cefTRIAXone (ROCEPHIN)  IV 2 g (11/12/20 0107)   PRN Meds:   Time spent: 35 minutes  Author: Lynden OxfordPranav  Tionne Carelli, MD [facility] 11/12/2020 7:08 PM  To reach On-call, see care teams to locate the attending and reach out via [email or web address]. Between 7PM-7AM, please contact night-coverage If you still have difficulty reaching the attending provider, please page the [facility] (Director on Call) for [facility] on amion for assistance.

## 2020-11-12 NOTE — Progress Notes (Signed)
Pt's blood pressure 96/62, pulse 90. Dr. Allena Katz notified, no new orders at this time.

## 2020-11-12 NOTE — Progress Notes (Addendum)
Initial Nutrition Assessment  DOCUMENTATION CODES:   Obesity unspecified  INTERVENTION:   -Ensure Enlive po TID, each supplement provides 350 kcal and 20 grams of protein -MVI with minerals daily  NUTRITION DIAGNOSIS:   Increased nutrient needs related to chronic illness (cirrhosis) as evidenced by estimated needs.  GOAL:   Patient will meet greater than or equal to 90% of their needs  MONITOR:   PO intake, Supplement acceptance, Labs, Weight trends, Skin, I & O's  REASON FOR ASSESSMENT:   Malnutrition Screening Tool    ASSESSMENT:   Shelby Conner is a 49 y.o. female with medical history significant of fatty liver, diabetes, protein C and S deficiency presents with progressive abdominal pain for the past 1 to 2 weeks.  Pt admitted with decompensated hepatic cirrhosis.   11/19- s/p Successful US guided paracentesis from right lateral abdomen (2.5 L dark yellow fluid removed)  Reviewed I/O's: +1.1 L x 24 hours   UOP: 200 ml x 24 hours  Spoke with pt at bedside, who was very drowsy at time of visit. She reports not feeling well and kept her eyes closed secondary to eye discomfort. She shares that she has had a decreased appetite over the past few weeks and has not eaten anything over the past few days. PTA she reports she "ate out of necessity", however, it was very difficult to obtain diet recall details.   Pt shares that her weight fluctuates at baseline, however, suspects weight loss over the past few weeks secondary to abdominal pain and decreased oral intake. Reviewed wt hx; wt has been stable over the past several years.   Medications reviewed and include lactulose.   No results found for: HGBA1C PTA DM medications are 500 mg metformin BID.   Labs reviewed:CBGS: 86-91  (inpatient orders for glycemic control are 0-9 units insulin aspart TID with meals).   NUTRITION - FOCUSED PHYSICAL EXAM:    Most Recent Value  Orbital Region No depletion  Upper Arm  Region No depletion  Thoracic and Lumbar Region No depletion  Buccal Region No depletion  Temple Region No depletion  Clavicle Bone Region No depletion  Clavicle and Acromion Bone Region No depletion  Scapular Bone Region No depletion  Dorsal Hand No depletion  Patellar Region No depletion  Anterior Thigh Region No depletion  Posterior Calf Region No depletion  Edema (RD Assessment) None  Hair Reviewed  Eyes Reviewed  Mouth Reviewed  Skin Reviewed  Nails Reviewed       Diet Order:   Diet Order            Diet 2 gram sodium Room service appropriate? Yes; Fluid consistency: Thin  Diet effective now                 EDUCATION NEEDS:   Not appropriate for education at this time  Skin:  Skin Assessment: Reviewed RN Assessment  Last BM:  11/09/20  Height:   Ht Readings from Last 1 Encounters:  11/12/20 5\' 1"  (1.549 m)    Weight:   Wt Readings from Last 1 Encounters:  11/12/20 74.3 kg    Ideal Body Weight:  47.7 kg  BMI:  Body mass index is 30.95 kg/m.  Estimated Nutritional Needs:   Kcal:  1900-2100  Protein:  105-120 grams  Fluid:  > 1.9 L    11/14/20, RD, LDN, CDCES Registered Dietitian II Certified Diabetes Care and Education Specialist Please refer to [facility] for RD and/or RD on-call/weekend/after hours pager

## 2020-11-12 NOTE — Progress Notes (Signed)
Pt's blood pressure 91/56, pulse 86. Dr. Allena Katz notified, new orders for albumin IV placed. Will continue to monitor.

## 2020-11-12 NOTE — Plan of Care (Signed)
  Problem: Pain Managment: Goal: General experience of comfort will improve Outcome: Progressing   

## 2020-11-13 LAB — CBC
HCT: 19 % — ABNORMAL LOW (ref 36.0–46.0)
HCT: 20.1 % — ABNORMAL LOW (ref 36.0–46.0)
HCT: 21.8 % — ABNORMAL LOW (ref 36.0–46.0)
Hemoglobin: 6.8 g/dL — CL (ref 12.0–15.0)
Hemoglobin: 7.4 g/dL — ABNORMAL LOW (ref 12.0–15.0)
Hemoglobin: 7.8 g/dL — ABNORMAL LOW (ref 12.0–15.0)
MCH: 48.9 pg — ABNORMAL HIGH (ref 26.0–34.0)
MCH: 43.5 pg — ABNORMAL HIGH (ref 26.0–34.0)
MCH: 43.3 pg — ABNORMAL HIGH (ref 26.0–34.0)
MCHC: 35.8 g/dL (ref 30.0–36.0)
MCHC: 36.8 g/dL — ABNORMAL HIGH (ref 30.0–36.0)
MCHC: 35.8 g/dL (ref 30.0–36.0)
MCV: 136.7 fL — ABNORMAL HIGH (ref 80.0–100.0)
MCV: 118.2 fL — ABNORMAL HIGH (ref 80.0–100.0)
MCV: 121.1 fL — ABNORMAL HIGH (ref 80.0–100.0)
Platelets: UNDETERMINED 10*3/uL (ref 150–400)
Platelets: UNDETERMINED 10*3/uL (ref 150–400)
Platelets: 101 10*3/uL — ABNORMAL LOW (ref 150–400)
RBC: 1.39 MIL/uL — ABNORMAL LOW (ref 3.87–5.11)
RBC: 1.7 MIL/uL — ABNORMAL LOW (ref 3.87–5.11)
RBC: 1.8 MIL/uL — ABNORMAL LOW (ref 3.87–5.11)
RDW: 18.4 % — ABNORMAL HIGH (ref 11.5–15.5)
WBC: 13.6 10*3/uL — ABNORMAL HIGH (ref 4.0–10.5)
WBC: 12.6 10*3/uL — ABNORMAL HIGH (ref 4.0–10.5)
WBC: 13.9 10*3/uL — ABNORMAL HIGH (ref 4.0–10.5)
nRBC: 0.4 % — ABNORMAL HIGH (ref 0.0–0.2)
nRBC: 0.3 % — ABNORMAL HIGH (ref 0.0–0.2)
nRBC: 0.6 % — ABNORMAL HIGH (ref 0.0–0.2)

## 2020-11-13 LAB — BASIC METABOLIC PANEL
Anion gap: 13 (ref 5–15)
BUN: 30 mg/dL — ABNORMAL HIGH (ref 6–20)
CO2: 32 mmol/L (ref 22–32)
Calcium: 9.1 mg/dL (ref 8.9–10.3)
Chloride: 84 mmol/L — ABNORMAL LOW (ref 98–111)
Creatinine, Ser: 1.22 mg/dL — ABNORMAL HIGH (ref 0.44–1.00)
GFR, Estimated: 55 mL/min — ABNORMAL LOW (ref >60)
Glucose, Bld: 113 mg/dL — ABNORMAL HIGH (ref 70–99)
Potassium: 3.4 mmol/L — ABNORMAL LOW (ref 3.5–5.1)
Sodium: 129 mmol/L — ABNORMAL LOW (ref 135–145)

## 2020-11-13 LAB — COMPREHENSIVE METABOLIC PANEL
ALT: 36 U/L (ref 0–44)
AST: 101 U/L — ABNORMAL HIGH (ref 15–41)
Albumin: 2.6 g/dL — ABNORMAL LOW (ref 3.5–5.0)
Alkaline Phosphatase: 119 U/L (ref 38–126)
Anion gap: 15 (ref 5–15)
BUN: 29 mg/dL — ABNORMAL HIGH (ref 6–20)
CO2: 33 mmol/L — ABNORMAL HIGH (ref 22–32)
Calcium: 9 mg/dL (ref 8.9–10.3)
Chloride: 82 mmol/L — ABNORMAL LOW (ref 98–111)
Creatinine, Ser: 1.27 mg/dL — ABNORMAL HIGH (ref 0.44–1.00)
GFR, Estimated: 52 mL/min — ABNORMAL LOW (ref >60)
Glucose, Bld: 108 mg/dL — ABNORMAL HIGH (ref 70–99)
Potassium: 2.9 mmol/L — ABNORMAL LOW (ref 3.5–5.1)
Sodium: 130 mmol/L — ABNORMAL LOW (ref 135–145)
Total Bilirubin: 19.6 mg/dL (ref 0.3–1.2)
Total Protein: 7.5 g/dL (ref 6.5–8.1)

## 2020-11-13 LAB — HEMOGLOBIN A1C
Hgb A1c MFr Bld: 4 % — ABNORMAL LOW (ref 4.8–5.6)
Mean Plasma Glucose: 68.1 mg/dL

## 2020-11-13 LAB — T4, FREE: Free T4: 0.76 ng/dL (ref 0.61–1.12)

## 2020-11-13 LAB — RETICULOCYTES
Immature Retic Fract: 25.5 % — ABNORMAL HIGH (ref 2.3–15.9)
RBC.: 1.7 MIL/uL — ABNORMAL LOW (ref 3.87–5.11)
Retic Count, Absolute: 115.4 10*3/uL (ref 19.0–186.0)
Retic Ct Pct: 6.8 % — ABNORMAL HIGH (ref 0.4–3.1)

## 2020-11-13 LAB — AFP TUMOR MARKER: AFP, Serum, Tumor Marker: 2.6 ng/mL (ref 0.0–8.3)

## 2020-11-13 LAB — TSH: TSH: 5.199 u[IU]/mL — ABNORMAL HIGH (ref 0.350–4.500)

## 2020-11-13 LAB — IRON AND TIBC: Iron: 80 ug/dL (ref 28–170)

## 2020-11-13 LAB — MAGNESIUM: Magnesium: 2.4 mg/dL (ref 1.7–2.4)

## 2020-11-13 LAB — PREPARE RBC (CROSSMATCH)

## 2020-11-13 LAB — GLUCOSE, CAPILLARY
Glucose-Capillary: 110 mg/dL — ABNORMAL HIGH (ref 70–99)
Glucose-Capillary: 103 mg/dL — ABNORMAL HIGH (ref 70–99)
Glucose-Capillary: 119 mg/dL — ABNORMAL HIGH (ref 70–99)

## 2020-11-13 LAB — VITAMIN B12: Vitamin B-12: 1100 pg/mL — ABNORMAL HIGH (ref 180–914)

## 2020-11-13 LAB — ABO/RH: ABO/RH(D): O POS

## 2020-11-13 LAB — PROTIME-INR
INR: 2.1 — ABNORMAL HIGH (ref 0.8–1.2)
Prothrombin Time: 22.8 seconds — ABNORMAL HIGH (ref 11.4–15.2)

## 2020-11-13 LAB — FOLATE: Folate: 3.1 ng/mL — ABNORMAL LOW (ref >5.9)

## 2020-11-13 MED ORDER — POTASSIUM CHLORIDE 10 MEQ/100ML IV SOLN
10.0000 meq | Freq: Once | INTRAVENOUS | Status: AC
  Administered 2020-11-13: 10 meq via INTRAVENOUS
  Filled 2020-11-13: qty 100

## 2020-11-13 MED ORDER — ALBUMIN HUMAN 25 % IV SOLN
50.0000 g | Freq: Four times a day (QID) | INTRAVENOUS | Status: DC
  Administered 2020-11-13: 12.5 g via INTRAVENOUS
  Administered 2020-11-13 – 2020-11-14 (×3): 50 g via INTRAVENOUS
  Administered 2020-11-14: 12.5 g via INTRAVENOUS
  Filled 2020-11-13 (×3): qty 200

## 2020-11-13 MED ORDER — LACTULOSE 10 GM/15ML PO SOLN
30.0000 g | Freq: Three times a day (TID) | ORAL | Status: DC
  Administered 2020-11-13 – 2020-11-14 (×3): 30 g via ORAL
  Filled 2020-11-13 (×3): qty 45

## 2020-11-13 MED ORDER — SODIUM CHLORIDE 0.9% IV SOLUTION: Freq: Once | INTRAVENOUS | Status: AC

## 2020-11-13 MED ORDER — OXYCODONE HCL 5 MG PO TABS: 5.0000 mg | ORAL_TABLET | Freq: Four times a day (QID) | ORAL | Status: DC | PRN

## 2020-11-13 MED ORDER — POTASSIUM CHLORIDE 10 MEQ/100ML IV SOLN
10.0000 meq | INTRAVENOUS | Status: AC
  Administered 2020-11-13 (×4): 10 meq via INTRAVENOUS
  Filled 2020-11-13 (×4): qty 100

## 2020-11-13 MED ORDER — HYDROMORPHONE HCL 1 MG/ML IJ SOLN
0.5000 mg | Freq: Four times a day (QID) | INTRAMUSCULAR | Status: AC | PRN
  Administered 2020-11-13 (×2): 0.5 mg via INTRAVENOUS
  Filled 2020-11-13 (×2): qty 0.5

## 2020-11-13 MED ORDER — OXYCODONE HCL 5 MG PO TABS
5.0000 mg | ORAL_TABLET | Freq: Two times a day (BID) | ORAL | Status: DC | PRN
  Administered 2020-11-13 – 2020-11-17 (×7): 5 mg via ORAL
  Filled 2020-11-13 (×8): qty 1

## 2020-11-13 MED ORDER — TRAMADOL HCL 50 MG PO TABS
50.0000 mg | ORAL_TABLET | Freq: Four times a day (QID) | ORAL | Status: DC | PRN
  Administered 2020-11-13 – 2020-11-17 (×5): 50 mg via ORAL
  Filled 2020-11-13 (×5): qty 1

## 2020-11-13 NOTE — Consult Note (Addendum)
[facility] Renal Consultation Note  Requesting MD:  Indication for Consultation: Concern for hepatorenal syndrome with acute kidney injury, maintenance of euvolemia, assessment and treatment of electrolyte abnormalities, assessment treatment of acid-base abnormalities.  HPI:   Shelby Conner is a 49 y.o. female.  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  She was admitted 11/11/2020 from underwent 2.5 L paracentesis.  She was also placed on antibiotic therapy IV ceftriaxone for suspected SBP.   Based on care everywhere her last creatinine was 0.62 07/11/2019.  On admission her creatinine was increased to 1.32 mg/dL.  Urine output was 200 cc recorded 11/12/2020.   Her home medications included Metformin 500 mg twice daily, Zetia 10 mg daily Crestor 40 mg a day Xarelto 20 mg daily and Victoza.  That does not appear to be the use of nonsteroidal anti-inflammatory drugs, ACE inhibitors ARB's.  She underwent a contrast-enhanced study 11/11/2020.  The results demonstrated Cirrhotic liver with severe fatty infiltration portal hypertension splenomegaly ascites and upper abdominal varices.  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  Cholelithiasis and aortic atherosclerosis also noted.  Blood pressure 92/54 pulse 92 temperature 98.7 O2 sats 92% nasal cannula  Sodium 130 potassium 2.9 chloride 82 CO2 33 BUN 29 creatinine 1.27 glucose 108 calcium 9 albumin 2.6 AST 101 ALT 36 WBC 13.6 hemoglobin 6.8 INR 2.1  bilirubin 19.6    Creatinine, Ser  Date/Time Value Ref Range Status  11/13/2020 01:48 AM 1.27 (H) 0.44 - 1.00 mg/dL Final  16/09/9603 54:09 AM 1.39 (H) 0.44 - 1.00 mg/dL Final  81/19/1478 29:56 PM 1.32 (H) 0.44 - 1.00 mg/dL Final  21/30/8657 84:69 AM 0.46 0.40 - 1.20 mg/dL Final   62/95/2841 32:44 AM 0.55 0.40 - 1.20 mg/dL Final  12/27/7251 66:44 AM 0.60 0.40 - 1.20 mg/dL Final  03/47/4259 56:38 AM 0.56 0.40 - 1.20 mg/dL Final  75/64/3329 51:88 PM 0.61 0.40 - 1.20 mg/dL Final     PMHx:   Past Medical History:  Diagnosis Date  . Diabetes mellitus without complication (HCC)   . Fatty liver   . Hyperlipidemia   . Protein C deficiency (HCC)   . Protein S deficiency [facility])     Past Surgical History:  Procedure Laterality Date  . ANKLE SURGERY Right   . CESAREAN SECTION    . IR PARACENTESIS  11/12/2020  . KNEE SURGERY Left     Family Hx: History reviewed. No pertinent family history.  Social History:  reports that she has quit smoking. She has never used smokeless tobacco. She reports current alcohol use. She reports that she does not use drugs.  Allergies: No Known Allergies  Medications: Prior to Admission medications   Medication Sig Start Date End Date Taking? Authorizing Provider  ezetimibe (ZETIA) 10 MG tablet Take 10 mg by mouth daily. 09/01/20  Yes [provider]  metFORMIN (GLUCOPHAGE) 500 MG tablet Take 500 mg by mouth 2 (two) times daily. 06/08/20  Yes [provider]  rosuvastatin (CRESTOR) 40 MG tablet Take 40 mg by mouth daily. 09/01/20  Yes [provider]  VICTOZA 18 MG/3ML SOPN Inject into the skin as directed. Sliding scale 06/08/20  Yes [provider]  XARELTO 20 MG TABS tablet Take 20 mg by mouth at bedtime. 09/01/20  Yes [provider]     Labs:  Results for  orders placed or performed during the hospital encounter of 11/11/20 (from the past 48 hour(s))  Lipase, blood     Status: Abnormal   Collection Time: 11/11/20  4:12 PM  Result Value Ref Range   Lipase 82 (H) 11 - 51 U/L    Comment: Performed at [facility] Lab, [address], [place], [place] [address]  Comprehensive metabolic panel     Status: Abnormal   Collection Time: 11/11/20  4:12 PM  Result Value Ref Range   Sodium 128  (L) 135 - 145 mmol/L   Potassium 2.4 (LL) 3.5 - 5.1 mmol/L    Comment: CRITICAL RESULT CALLED TO, READ BACK BY AND VERIFIED WITH: A.COSTALES RN 1732 11/11/20 MCCORMICK K    Chloride 76 (L) 98 - 111 mmol/L   CO2 36 (H) 22 - 32 mmol/L   Glucose, Bld 90 70 - 99 mg/dL    Comment: Glucose reference range applies only to samples taken after fasting for at least 8 hours.   BUN 20 6 - 20 mg/dL   Creatinine, Ser 6.04 (H) 0.44 - 1.00 mg/dL   Calcium 8.9 8.9 - 54.0 mg/dL   Total Protein 8.7 (H) 6.5 - 8.1 g/dL   Albumin 2.3 (L) 3.5 - 5.0 g/dL   AST 981 (H) 15 - 41 U/L   ALT 44 0 - 44 U/L   Alkaline Phosphatase 167 (H) 38 - 126 U/L   Total Bilirubin 19.8 (HH) 0.3 - 1.2 mg/dL    Comment: CRITICAL RESULT CALLED TO, READ BACK BY AND VERIFIED WITH: A.COSTALES RN 1732 11/11/20 MCCORMICK K    GFR, Estimated 50 (L) >60 mL/min    Comment: (NOTE) Calculated using the CKD-EPI Creatinine Equation (2021)    Anion gap 16 (H) 5 - 15    Comment: Performed at [facility] Lab, [address] [place], [place] [address]  CBC     Status: Abnormal   Collection Time: 11/11/20  4:12 PM  Result Value Ref Range   WBC 17.5 (H) 4.0 - 10.5 K/uL   RBC 1.71 (L) 3.87 - 5.11 MIL/uL   Hemoglobin 8.2 (L) 12.0 - 15.0 g/dL   HCT 82.9 (L) 36 - 46 %   MCV 139.2 (H) 80.0 - 100.0 fL   MCH 48.0 (H) 26.0 - 34.0 pg   MCHC 34.5 30.0 - 36.0 g/dL   RDW 56.2 (H) 13.0 - 86.5 %   Platelets 108 (L) 150 - 400 K/uL    Comment: REPEATED TO VERIFY PLATELET CLUMPS NOTED ON SMEAR, UNABLE TO ESTIMATE Immature Platelet Fraction may be clinically indicated, consider ordering this additional test [id number]    nRBC 0.7 (H) 0.0 - 0.2 %    Comment: Performed at [facility] Lab, [address], [place], [place] [address]  I-Stat beta hCG blood, ED     Status: None   Collection Time: 11/11/20  4:29 PM  Result Value Ref Range   I-stat hCG, quantitative <5.0 <5 mIU/mL   Comment 3            Comment:   GEST. AGE      CONC.  (mIU/mL)    <=1 WEEK        5 - 50     2 WEEKS       50 - 500     3 WEEKS       100 - 10,000     4 WEEKS     1,000 - 30,000  FEMALE AND NON-PREGNANT FEMALE:     LESS THAN 5 mIU/mL   Respiratory Panel by RT PCR (Flu A&B, Covid) - Nasopharyngeal Swab     Status: None   Collection Time: 11/11/20  9:04 PM   Specimen: Nasopharyngeal Swab; Nasopharyngeal(NP) swabs in vial transport medium  Result Value Ref Range   SARS Coronavirus 2 by RT PCR NEGATIVE NEGATIVE    Comment: (NOTE) SARS-CoV-2 target nucleic acids are NOT DETECTED.  The SARS-CoV-2 RNA is generally detectable in upper respiratoy specimens during the acute phase of infection. The lowest concentration of SARS-CoV-2 viral copies this assay can detect is 131 copies/mL. A negative result does not preclude SARS-Cov-2 infection and should not be used as the sole basis for treatment or other patient management decisions. A negative result may occur with  improper specimen collection/handling, submission of specimen other than nasopharyngeal swab, presence of viral mutation(s) within the areas targeted by this assay, and inadequate number of viral copies (<131 copies/mL). A negative result must be combined with clinical observations, patient history, and epidemiological information. The expected result is Negative.  Fact Sheet for Patients:  https://www.moore.com/  Fact Sheet for Healthcare Providers:  https://www.young.biz/  This test is no t yet approved or cleared by the [place] FDA and  has been authorized for detection and/or diagnosis of SARS-CoV-2 by FDA under an Emergency Use Authorization (EUA). This EUA will remain  in effect (meaning this test can be used) for the duration of the COVID-19 declaration under Section 564(b)(1) of the Act, 21 U.S.C. section 360bbb-3(b)(1), unless the authorization is terminated or revoked sooner.     Influenza A by PCR NEGATIVE NEGATIVE   Influenza  B by PCR NEGATIVE NEGATIVE    Comment: (NOTE) The Xpert Xpress SARS-CoV-2/FLU/RSV assay is intended as an aid in  the diagnosis of influenza from Nasopharyngeal swab specimens and  should not be used as a sole basis for treatment. Nasal washings and  aspirates are unacceptable for Xpert Xpress SARS-CoV-2/FLU/RSV  testing.  Fact Sheet for Patients: https://www.moore.com/  Fact Sheet for Healthcare Providers: https://www.young.biz/  This test is not yet approved or cleared by the [place] FDA and  has been authorized for detection and/or diagnosis of SARS-CoV-2 by  FDA under an Emergency Use Authorization (EUA). This EUA will remain  in effect (meaning this test can be used) for the duration of the  Covid-19 declaration under Section 564(b)(1) of the Act, 21  U.S.C. section 360bbb-3(b)(1), unless the authorization is  terminated or revoked. Performed at [facility] Lab, [address] [place], [place] [address]   Protime-INR     Status: Abnormal   Collection Time: 11/11/20  9:23 PM  Result Value Ref Range   Prothrombin Time 23.0 (H) 11.4 - 15.2 seconds   INR 2.1 (H) 0.8 - 1.2    Comment: (NOTE) INR goal varies based on device and disease states. Performed at [facility] Lab, [address], [place], [place] [address]   Hepatitis panel, acute     Status: None   Collection Time: 11/11/20  9:23 PM  Result Value Ref Range   Hepatitis B Surface Ag NON REACTIVE NON REACTIVE   HCV Ab NON REACTIVE NON REACTIVE    Comment: (NOTE) Nonreactive HCV antibody screen is consistent with no HCV infections,  unless recent infection is suspected or other evidence exists to indicate HCV infection.     Hep A IgM NON REACTIVE NON REACTIVE   Hep B C IgM NON REACTIVE  NON REACTIVE    Comment: Performed at [facility] Lab, [address] [place], [place] [address]  HIV Antibody (routine testing w rflx)     Status: None   Collection Time:  11/12/20  2:43 AM  Result Value Ref Range   HIV Screen 4th Generation wRfx Non Reactive Non Reactive    Comment: Performed at [facility], [address], [place], [place] [address]  Comprehensive metabolic panel     Status: Abnormal   Collection Time: 11/12/20  2:43 AM  Result Value Ref Range   Sodium 128 (L) 135 - 145 mmol/L   Potassium 3.7 3.5 - 5.1 mmol/L   Chloride 80 (L) 98 - 111 mmol/L   CO2 33 (H) 22 - 32 mmol/L   Glucose, Bld 90 70 - 99 mg/dL    Comment: Glucose reference range applies only to samples taken after fasting for at least 8 hours.   BUN 23 (H) 6 - 20 mg/dL   Creatinine, Ser 0.98 (H) 0.44 - 1.00 mg/dL   Calcium 8.5 (L) 8.9 - 10.3 mg/dL   Total Protein 7.6 6.5 - 8.1 g/dL   Albumin 2.0 (L) 3.5 - 5.0 g/dL   AST 119 (H) 15 - 41 U/L   ALT 39 0 - 44 U/L   Alkaline Phosphatase 129 (H) 38 - 126 U/L   Total Bilirubin 18.3 (HH) 0.3 - 1.2 mg/dL    Comment: CRITICAL VALUE NOTED.  VALUE IS CONSISTENT WITH PREVIOUSLY REPORTED AND CALLED VALUE.   GFR, Estimated 47 (L) >60 mL/min    Comment: (NOTE) Calculated using the CKD-EPI Creatinine Equation (2021)    Anion gap 15 5 - 15    Comment: Performed at [facility] Lab, [address], [place], [place] [address]  CBC     Status: Abnormal   Collection Time: 11/12/20  2:43 AM  Result Value Ref Range   WBC 14.6 (H) 4.0 - 10.5 K/uL    Comment: WHITE COUNT CONFIRMED ON SMEAR   RBC 1.54 (L) 3.87 - 5.11 MIL/uL   Hemoglobin 7.5 (L) 12.0 - 15.0 g/dL   HCT 95.6 (L) 36 - 46 %   MCV 139.6 (H) 80.0 - 100.0 fL   MCH 48.7 (H) 26.0 - 34.0 pg   MCHC 34.9 30.0 - 36.0 g/dL   RDW 21.3 (H) 08.6 - 57.8 %   Platelets PLATELET CLUMPS NOTED ON SMEAR, UNABLE TO ESTIMATE 150 - 400 K/uL   nRBC 0.4 (H) 0.0 - 0.2 %    Comment: Performed at [facility], [address] [place], [place] [address]  Protime-INR     Status: Abnormal   Collection Time: 11/12/20  2:43 AM  Result Value Ref Range   Prothrombin Time 22.4 (H) 11.4 - 15.2  seconds   INR 2.1 (H) 0.8 - 1.2    Comment: (NOTE) INR goal varies based on device and disease states. Performed at [facility], [address], [place], [place] [address]   Magnesium     Status: Abnormal   Collection Time: 11/12/20  2:43 AM  Result Value Ref Range   Magnesium 2.5 (H) 1.7 - 2.4 mg/dL    Comment: Performed at [facility], [address] [place], [place] [address]  Lactate dehydrogenase     Status: Abnormal   Collection Time: 11/12/20  2:43 AM  Result Value Ref Range   LDH 277 (H) 98 - 192 U/L    Comment: Performed at  [facility], [address] [place] [place], [place] [address]  Glucose, capillary     Status: None   Collection Time: 11/12/20  9:01 AM  Result Value Ref Range   Glucose-Capillary 86 70 - 99 mg/dL    Comment: Glucose reference range applies only to samples taken after fasting for at least 8 hours.  Lactate dehydrogenase (pleural or peritoneal fluid)     Status: Abnormal   Collection Time: 11/12/20 10:13 AM  Result Value Ref Range   LD, Fluid 83 (H) 3 - 23 U/L    Comment: (NOTE) Results should be evaluated in conjunction with serum values    Fluid Type-FLDH PERITONEAL     Comment: Performed at [facility], [address] [place], [place] [address] CORRECTED ON 11/19 AT 1053: PREVIOUSLY REPORTED AS CYTO PERI   Body fluid cell count with differential     Status: Abnormal   Collection Time: 11/12/20 10:13 AM  Result Value Ref Range   Fluid Type-FCT      QUESTIONABLE RESULTS, RECOMMEND RECOLLECT TO VERIFY    Comment: CORRECTED ON 11/19 AT 1705: PREVIOUSLY REPORTED AS PERITONEAL, CORRECTED ON 11/19 AT 1053: PREVIOUSLY REPORTED AS CYTO PERI   Color, Fluid (A) YELLOW    QUESTIONABLE RESULTS, RECOMMEND RECOLLECT TO VERIFY    Comment: CORRECTED ON 11/19 AT 1705: PREVIOUSLY REPORTED AS YELLOW   Appearance, Fluid (A) CLEAR    QUESTIONABLE RESULTS, RECOMMEND RECOLLECT TO VERIFY    Comment: CORRECTED ON 11/19 AT 1705: PREVIOUSLY  REPORTED AS CLOUDY   Total Nucleated Cell Count, Fluid  0 - 1,000 cu mm    QUESTIONABLE RESULTS, RECOMMEND RECOLLECT TO VERIFY    Comment: CORRECTED ON 11/19 AT 1705: PREVIOUSLY REPORTED AS 892   Neutrophil Count, Fluid  0 - 25 %    QUESTIONABLE RESULTS, RECOMMEND RECOLLECT TO VERIFY    Comment: CORRECTED ON 11/19 AT 1705: PREVIOUSLY REPORTED AS 14   Lymphs, Fluid  %    QUESTIONABLE RESULTS, RECOMMEND RECOLLECT TO VERIFY    Comment: CORRECTED ON 11/19 AT 1705: PREVIOUSLY REPORTED AS 33   Monocyte-Macrophage-Serous Fluid  50 - 90 %    QUESTIONABLE RESULTS, RECOMMEND RECOLLECT TO VERIFY    Comment: CORRECTED ON 11/19 AT 1705: PREVIOUSLY REPORTED AS 53   Eos, Fluid  %    QUESTIONABLE RESULTS, RECOMMEND RECOLLECT TO VERIFY    Comment: CORRECTED ON 11/19 AT 1705: PREVIOUSLY REPORTED AS 0   Other Cells, Fluid  %    QUESTIONABLE RESULTS, RECOMMEND RECOLLECT TO VERIFY    Comment: Performed at [facility], [address], [place], [place] [address] CORRECTED ON 11/19 AT 1705: PREVIOUSLY REPORTED AS MESOTHELIAL CELLS   Gram stain     Status: None   Collection Time: 11/12/20 10:13 AM   Specimen: PATH Cytology Peritoneal fluid  Result Value Ref Range   Specimen Description PERITONEAL FLUID    Special Requests NONE    Gram Stain      WBC PRESENT,BOTH PMN AND MONONUCLEAR NO ORGANISMS SEEN CYTOSPIN SMEAR Performed at [facility], [address] [place], [place] [address]    Report Status 11/12/2020 FINAL   Protein, pleural or peritoneal fluid     Status: None   Collection Time: 11/12/20 10:13 AM  Result Value Ref Range   Total protein, fluid <3.0 g/dL    Comment: (NOTE) No normal range established for this test Results should be evaluated in conjunction with serum values    Fluid Type-FTP  PERITONEAL     Comment: Performed at [facility], [address] [place], [place] [address] CORRECTED ON 11/19 AT 1053: PREVIOUSLY REPORTED AS CYTO PERI   Glucose, pleural or  peritoneal fluid     Status: None   Collection Time: 11/12/20 10:13 AM  Result Value Ref Range   Glucose, Fluid 82 mg/dL    Comment: (NOTE) No normal range established for this test Results should be evaluated in conjunction with serum values    Fluid Type-FGLU PERITONEAL     Comment: Performed at [facility], [address] [place], [place] [address] CORRECTED ON 11/19 AT 1053: PREVIOUSLY REPORTED AS CYTO PERI   Culture, body fluid-bottle     Status: None (Preliminary result)   Collection Time: 11/12/20 10:13 AM   Specimen: Peritoneal Washings  Result Value Ref Range   Specimen Description PERITONEAL FLUID    Special Requests NONE    Culture      NO GROWTH <12 HOURS Performed at [facility], [address] [place], [place] [address]    Report Status PENDING   Body fluid cell count with differential     Status: Abnormal   Collection Time: 11/12/20 10:13 AM  Result Value Ref Range   Fluid Type-FCT PERITONEAL    Color, Fluid YELLOW (A) YELLOW   Appearance, Fluid CLEAR CLEAR   Total Nucleated Cell Count, Fluid 61 0 - 1,000 cu mm   Neutrophil Count, Fluid 32 (H) 0 - 25 %   Lymphs, Fluid 36 %   Monocyte-Macrophage-Serous Fluid 32 (L) 50 - 90 %   Eos, Fluid 0 %   Other Cells, Fluid MESOTHELIAL CELLS %    Comment: Performed at [facility], [address] [place], [place] [address]  Ferritin     Status: Abnormal   Collection Time: 11/12/20 10:27 AM  Result Value Ref Range   Ferritin 1,167 (H) 11 - 307 ng/mL    Comment: Performed at [facility], [address] [place], [place] [address]  Iron and TIBC     Status: None   Collection Time: 11/12/20 10:27 AM  Result Value Ref Range   Iron 87 28 - 170 ug/dL   TIBC NOT CALCULATED 130250 - 450 ug/dL    Comment: TRANSFERRIN <70 RESULT REPEATED AND VERIFIED    Saturation Ratios NOT CALCULATED 10.4 - 31.8 %   UIBC NOT CALCULATED ug/dL    Comment: Performed at [facility], [address]  [place], [place] [address]  Ammonia     Status: Abnormal   Collection Time: 11/12/20 10:27 AM  Result Value Ref Range   Ammonia 36 (H) 9 - 35 umol/L    Comment: Performed at [facility], [address] [place], [place] [address]  Glucose, capillary     Status: None   Collection Time: 11/12/20 11:26 AM  Result Value Ref Range   Glucose-Capillary 91 70 - 99 mg/dL    Comment: Glucose reference range applies only to samples taken after fasting for at least 8 hours.  Glucose, capillary     Status: None   Collection Time: 11/12/20  4:42 PM  Result Value Ref Range   Glucose-Capillary 87 70 - 99 mg/dL    Comment: Glucose reference range applies only to samples taken after fasting for at least 8 hours.  Osmolality     Status: None   Collection Time: 11/12/20  6:46 PM  Result Value Ref Range  Osmolality 293 275 - 295 mOsm/kg    Comment: Performed at [facility], [address], [place], [place] [address]  Glucose, capillary     Status: Abnormal   Collection Time: 11/12/20  8:37 PM  Result Value Ref Range   Glucose-Capillary 115 (H) 70 - 99 mg/dL    Comment: Glucose reference range applies only to samples taken after fasting for at least 8 hours.  Comprehensive metabolic panel     Status: Abnormal   Collection Time: 11/13/20  1:48 AM  Result Value Ref Range   Sodium 130 (L) 135 - 145 mmol/L   Potassium 2.9 (L) 3.5 - 5.1 mmol/L   Chloride 82 (L) 98 - 111 mmol/L   CO2 33 (H) 22 - 32 mmol/L   Glucose, Bld 108 (H) 70 - 99 mg/dL    Comment: Glucose reference range applies only to samples taken after fasting for at least 8 hours.   BUN 29 (H) 6 - 20 mg/dL   Creatinine, Ser 1.44 (H) 0.44 - 1.00 mg/dL   Calcium 9.0 8.9 - 31.5 mg/dL   Total Protein 7.5 6.5 - 8.1 g/dL   Albumin 2.6 (L) 3.5 - 5.0 g/dL   AST 400 (H) 15 - 41 U/L   ALT 36 0 - 44 U/L   Alkaline Phosphatase 119 38 - 126 U/L   Total Bilirubin 19.6 (HH) 0.3 - 1.2 mg/dL    Comment: CRITICAL VALUE NOTED.  VALUE IS CONSISTENT  WITH PREVIOUSLY REPORTED AND CALLED VALUE.   GFR, Estimated 52 (L) >60 mL/min    Comment: (NOTE) Calculated using the CKD-EPI Creatinine Equation (2021)    Anion gap 15 5 - 15    Comment: Performed at [facility], [address] [place], [place] [address]  Protime-INR     Status: Abnormal   Collection Time: 11/13/20  1:48 AM  Result Value Ref Range   Prothrombin Time 22.8 (H) 11.4 - 15.2 seconds   INR 2.1 (H) 0.8 - 1.2    Comment: (NOTE) INR goal varies based on device and disease states. Performed at [facility], [address] [place], [place] [address]   CBC     Status: Abnormal   Collection Time: 11/13/20  1:48 AM  Result Value Ref Range   WBC 13.6 (H) 4.0 - 10.5 K/uL    Comment: WHITE COUNT CONFIRMED ON SMEAR   RBC 1.39 (L) 3.87 - 5.11 MIL/uL   Hemoglobin 6.8 (LL) 12.0 - 15.0 g/dL    Comment: REPEATED TO VERIFY THIS CRITICAL RESULT HAS VERIFIED AND BEEN CALLED TO L. COLLIE,RN BY TERRAN TYSOR ON 11 20 2021 AT 0331, AND HAS BEEN READ BACK.     HCT 19.0 (L) 36 - 46 %   MCV 136.7 (H) 80.0 - 100.0 fL   MCH 48.9 (H) 26.0 - 34.0 pg   MCHC 35.8 30.0 - 36.0 g/dL   RDW 26.7 (H) 12.4 - 58.0 %   Platelets PLATELET CLUMPS NOTED ON SMEAR, UNABLE TO ESTIMATE 150 - 400 K/uL   nRBC 0.4 (H) 0.0 - 0.2 %    Comment: Performed at [facility], [address], [place], [place] [address]  Type and screen [facility]     Status: None (Preliminary result)   Collection Time: 11/13/20  4:55 AM  Result Value Ref Range   ABO/RH(D) O POS    Antibody Screen NEG    Sample Expiration 11/16/2020,2359    Unit Number  [id number]    Blood Component Type RED CELLS,LR    Unit division 00    Status of Unit ALLOCATED    Transfusion Status OK TO TRANSFUSE    Crossmatch Result      Compatible Performed at [facility], [address] [place], [place] [address]   Prepare RBC (crossmatch)     Status: None   Collection Time: 11/13/20  5:02 AM  Result Value  Ref Range   Order Confirmation      ORDER PROCESSED BY BLOOD BANK Performed at [facility], [address] [place], [place] [address]   ABO/Rh     Status: None   Collection Time: 11/13/20  5:46 AM  Result Value Ref Range   ABO/RH(D)      O POS Performed at [facility], [address], [place], [place] [address]      ROS:  General: Malaise and fatigue no fever sweats chills HEENT negative for hearing loss sinusitis sore throat Eyes: No visual complaints Respiratory   dyspnea on exertion negative for cough Cardiovascular: Negative for chest pain palpitations blackout spells Gastrointestinal positive for abdominal pain nausea negative for blood in stools constipation diarrhea Genitourinary negative for flank pain hematuria Dermatologic negative for itching or rash Neurologic negative for seizures strokes numbness tingling paresthesias Endocrine positive for diabetes negative for thyroid or adrenal disease  Physical Exam: Vitals:   11/12/20 2039 11/13/20 0453  BP: 106/68 (!) 92/54  Pulse: 87 89  Resp: 17 17  Temp: 97.7 F (36.5 C) 98.7 F (37.1 C)  SpO2: 95% 92%     General: Ill-appearing using nasal cannula nondistressed HEENT: Normocephalic atraumatic mucous membranes moist Eyes: Scleral icterus extraocular movements intact Neck: JVP not elevated no thyromegaly Heart: Regular rate and rhythm no murmurs rubs or gallops Lungs: Clear to auscultation no wheeze or rales normal breath sounds Abdomen: Abdominal distention abdominal tenderness no guarding no rebound Extremities: No cyanosis clubbing or edema Skin: Jaundiced Neuro: Lethargic with asterixis  Assessment/Plan: 1.Acute kidney injury.  No evidence of obstruction on CT scan 11/11/2020.  Patient has severe liver disease of the question is whether this could represent hepatorenal syndrome.  She did receive some IV contrast on 11/11/2020.  Bilirubin is elevated and therefore bile pigment nephropathy is  in the differential .There does not appear to be the administration of other nephrotoxins.  She was started on ceftriaxone 11/11/2020.  She does have marginal blood pressures consistent with a severe liver disease.  I do not see any evidence of direct shock.  Need to quantitate ins and outs closely.  Avoid nephrotoxins.  Check some urine electrolytes particularly the urine sodium will be helpful.  It is encouraging that her renal function has not deteriorated since admission.  I think it is reasonable to continue albumin expansion.  Will order IV albumin 50 g twice daily. 2. Hypertension/volume  -continue volume expansion with IV albumin 3.  Protein C and S deficiency currently holding Xarelto with drop in hemoglobin.  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 4.  Suspected SBP with acute on chronic liver disease and acute decompensation.  Appreciate assistance from Dr. Matthias Hughs for gastroenterology. 5.  Diabetes mellitus continues to stop Metformin continue insulin sliding scale 6.  Hypokalemia most likely increased activation of RAS and mineralocorticoid secondary to acute liver decompensation.  Agree with repletion.  Will follow closely. 7.  Acute on chronic liver disease.  We will continue to follow.  Status post paracentesis 2.5 L.  Lactulose 30 g twice daily trending LFTs additional testing to rule out other causes of cirrhosis  Discussed with husband Shelby Conner.  He is aware of the critical nature of his wife's condition.  I updated him on her clinical status and recent labs.   Garnetta Buddy 11/13/2020, 6:41 AM

## 2020-11-13 NOTE — Progress Notes (Signed)
Palliative Medicine Inpatient Follow Up Note  Reason for consult: Goals of Care "Goals of Care Clarity"   HPI:  Per intake H&P --> Shelby Conner a 49 y.o.femalewith medical history significant offatty liver, diabetes, protein C and S deficiency presents with progressive abdominal pain for the past 1 to 2 weeks. Patient states that she has progressive abdominal pain for the past 1 to 2 weeks. She is also noted progressive jaundice and abdominal distention for the past week. She reports nausea vomiting.She reports some mild shortness of breath and chest pain that appear to be related to inability to take deep breaths because of abdominal pain. She denies fevers, constipation, diarrhea. She states that she is an occasional drinker but has no significant alcohol use beyond that. She does not use significant acetaminophen. Does have a history of fatty liver which was diagnosed 6 years ago per her report.  Palliative care was asked to get involved in the setting of a cirrhosis diagnosis.  We were asked to speak with the patient and her husband to identify whether or not there was enough social support to consider transplantation.  Today's Discussion (11/13/2020): Chart reviewed.  I met with Shelby Conner at bedside, she expressed that when she  sees being she thinks she is dying.  I shared with Shelby Conner that at this moment she is not dying but part of my role is to help she and her husband understand her disease process and the severity of it.  I shared that in the grand or scheme of things it's exceptionally more helpful to plan for a bad outcome so that family and friends have a level of preparedness.  I shared my hopes for a good outcome but was honest about her tenuous state at this moment.  From a symptom perspective Shelby Conner complains of abdominal tightness she does have more pronounced abdominal distention than yesterday.  We talked about ascites as a relates to her liver  disease.  I was able to call patient's husband Shelby Conner and provide him with a update.  He was very thankful as he had heard from hepatology as well as nephrology.  He vocalized that he was waiting to hear from the primary medical team.  I requested our chaplain service to stop by and speak to [facility] about her fears as they relate to her acute on chronic disease process and as they relate to death and dying.  Discussed the importance of continued conversation with family and their  medical providers regarding overall plan of care and treatment options, ensuring decisions are within the context of the patients values and GOCs.  Questions and concerns addressed   Objective Assessment: Vital Signs Vitals:   11/13/20 0805 11/13/20 0837  BP: 96/60 97/61  Pulse: 88 91  Resp: 17 17  Temp: 98.4 F (36.9 C) 98.6 F (37 C)  SpO2:  93%    Intake/Output Summary (Last 24 hours) at 11/13/2020 1013 Last data filed at 11/13/2020 0212 Gross per 24 hour  Intake 300 ml  Output --  Net 300 ml   Last Weight  Most recent update: 11/13/2020  6:57 AM   Weight  74.3 kg (163 lb 12.8 oz)           Gen:  F in NAD HEENT: Yellow sclera, moist mucous membranes CV: Regular rate and rhythm  PULM: 3LPM [place], clear to auscultation bilaterally  ABD: Notably distended EXT: No edema  Neuro: Alert and oriented x3 - Patient remains altered incrementally  SUMMARY OF  RECOMMENDATIONS Full Code / Full Scope of Treatment  Patient would be interested in liver transplant if offered  Advance directives complete --> will be notarized on Monday with the help of our spiritual team.  Ongoing PMT Support  Time Spent: 35 Greater than 50% of the time was spent in counseling and coordination of care ______________________________________________________________________________________ [place] Team Team Cell Phone: [phone] Please utilize secure chat with additional  questions, if there is no response within 30 minutes please call the above phone number  Palliative Medicine Team providers are available by phone from 7am to 7pm daily and can be reached through the team cell phone.  Should this patient require assistance outside of these hours, please call the patient's attending physician.

## 2020-11-13 NOTE — Progress Notes (Signed)
Patient placed back to bed . Bed in lowest position, call bell within reach, bed alarm on.

## 2020-11-13 NOTE — Progress Notes (Signed)
Pt more alert this afternoon, some confusion. I unit PRBCs completed. Potassium supplemented today. Abd is distended, pain meds for abd pain given.

## 2020-11-13 NOTE — Progress Notes (Signed)
Patient up to bedside commode, bathed and put back to bed . Patient tolerated well. Bed in lowest position,call bell within reach, bed alarm on. Will continue to monitor.

## 2020-11-13 NOTE — Progress Notes (Signed)
Patient up in recliner with 2 assist, unsteady and week. PRN pain med given for abdominal pain.Chair alarm on and will continue to monitor.

## 2020-11-13 NOTE — Progress Notes (Signed)
[facility] Progress Note  Patient: Shelby Conner    FYB:[id number]  DOA: 11/11/2020     Date of Service: the patient was seen and examined on 11/13/2020  Brief hospital course: Past medical history questionable, type II DM, protein C&S deficiency on chronic anticoagulation.  Presents with progressively worsening abdominal pain.  Found to have acute on chronic liver disease rapidly progressing to liver failure. GI consulted. At the request of the GI palliative care as well as nephrology were also considered. SP paracentesis. Currently plan is continue current care under GI guidance.  Assessment and Plan: 1.  SBP suspected Acute on chronic liver disease with decompensation Hepatic encephalopathy with asterixis Significantly high although INR can be elevated secondary to being on anticoagulation. Appreciate GI consultation. Currently initiating further work-up. SP 2.5 L of paracentesis. On IV ceftriaxone. Follow-up on cultures. Continue lactulose dose increased. Avoid hepatotoxic medication. IV albumin 50 g  2.  Acute kidney injury. Hypokalemia. Etiology not clear.  At risk for hepatorenal syndrome.  Further work-up initiated.  Nephrology consult.  Will monitor response.  3.  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 Currently holding. May require initiation of IV heparin.  4.  Type 2 diabetes mellitus On Metformin at home. Hemoglobin A1c 4.0. On sliding scale insulin.  5.  Goals of care. Appreciate palliative care assistance. Continue full code. Patient agreeable for liver transplant if offered.  6.  Acute on chronic anemia. No acute bleeding. B12 normal. Reticulocyte count is elevated. CBC not significantly elevated after PRBC transfusion. We will recheck.  If remains less than 7.4 patient will require CT abdomen. Folic acid significantly low.  Will replace. B12 normal.  Body mass index is 30.95 kg/m.  Nutrition Problem: Increased nutrient  needs Etiology: chronic illness (cirrhosis) Interventions: Interventions: Ensure Enlive (each supplement provides 350kcal and 20 grams of protein), MVI      Diet: Low-sodium diet DVT Prophylaxis:       Advance goals of care discussion: Full code  Family Communication: no family was present at bedside, at the time of interview.  Discussed with husband on the phone.  Disposition:  Status is: Inpatient  Remains inpatient appropriate because:IV treatments appropriate due to intensity of illness or inability to take PO   Dispo: The patient is from: Home              Anticipated d/c is to: Home              Anticipated d/c date is: 3 days              Patient currently is not medically stable to d/c.  Subjective: Remains confused today.  No abdominal pain no nausea no vomiting but no fever no chills.  Unable to stay awake during the conversation.  Physical Exam:  General: Appear in moderate distress, no Rash; Oral Mucosa Clear, moist. no Abnormal Neck Mass Or lumps, Conjunctiva normal  Cardiovascular: S1 and S2 Present, n Murmur, Respiratory: good respiratory effort, Bilateral Air entry present and CTA, no Crackles, no wheezes Abdomen: Bowel Sound present, Soft and distended, no tenderness Extremities: no Pedal edema Neurology: lethargic and oriented to place and person affect flat in affect. no new focal deficit, asterixis positive. Gait not checked due to patient safety concerns  Vitals:   11/13/20 0500 11/13/20 0805 11/13/20 0837 11/13/20 1121  BP:  96/60 97/61 105/64  Pulse:  88 91 88  Resp:  17 17 16   Temp:  98.4 F (36.9 C) 98.6 F (37 C)  98.2 F (36.8 C)  TempSrc:  Oral Oral Oral  SpO2:   93% 92%  Weight: 74.3 kg     Height:        Intake/Output Summary (Last 24 hours) at 11/13/2020 1804 Last data filed at 11/13/2020 1446 Gross per 24 hour  Intake 1050 ml  Output 150 ml  Net 900 ml   Filed Weights   11/12/20 0002 11/13/20 0500  Weight: 74.3 kg 74.3 kg     Data Reviewed: I have personally reviewed and interpreted daily labs, tele strips, imagings as discussed above. I reviewed all nursing notes, pharmacy notes, vitals, pertinent old records I have discussed plan of care as described above with RN and patient/family.  CBC: Recent Labs  Lab 11/11/20 1612 11/12/20 0243 11/13/20 0148 11/13/20 1545  WBC 17.5* 14.6* 13.6* 12.6*  HGB 8.2* 7.5* 6.8* 7.4*  HCT 23.8* 21.5* 19.0* 20.1*  MCV 139.2* 139.6* 136.7* 118.2*  PLT 108* PLATELET CLUMPS NOTED ON SMEAR, UNABLE TO ESTIMATE PLATELET CLUMPS NOTED ON SMEAR, UNABLE TO ESTIMATE PLATELET CLUMPS NOTED ON SMEAR, UNABLE TO ESTIMATE   Basic Metabolic Panel: Recent Labs  Lab 11/11/20 1612 11/12/20 0243 11/13/20 0148  NA 128* 128* 130*  K 2.4* 3.7 2.9*  CL 76* 80* 82*  CO2 36* 33* 33*  GLUCOSE 90 90 108*  BUN 20 23* 29*  CREATININE 1.32* 1.39* 1.27*  CALCIUM 8.9 8.5* 9.0  MG  --  2.5*  --     Studies: No results found.  Scheduled Meds: . feeding supplement  237 mL Oral TID BM  . influenza vac split quadrivalent PF  0.5 mL Intramuscular Tomorrow-1000  . insulin aspart  0-9 Units Subcutaneous TID WC  . lactulose  30 g Oral TID  . multivitamin with minerals  1 tablet Oral Daily  . pneumococcal 23 valent vaccine  0.5 mL Intramuscular Tomorrow-1000  . sodium chloride flush  3 mL Intravenous Q12H   Continuous Infusions: . albumin human 50 g (11/13/20 1746)  . cefTRIAXone (ROCEPHIN)  IV 2 g (11/12/20 2350)   PRN Meds:   Time spent: 35 minutes  Author: Lynden Oxford, MD [facility] 11/13/2020 6:04 PM  To reach On-call, see care teams to locate the attending and reach out via [email or web address]. Between 7PM-7AM, please contact night-coverage If you still have difficulty reaching the attending provider, please page the [facility] (Director on Call) for [facility] on amion for assistance.

## 2020-11-13 NOTE — Progress Notes (Signed)
Shelby Conner 4:03 PM  Subjective: Patient seen and examined and discussed with my partner Dr. Matthias Hughs in her hospital computer chart reviewed and her main complaint today is inability to keep her eyes open and she says she had an endoscopy in [place] a year ago but has not seen any obvious bleeding and is on chronic blood thinners for blood clots has not seen any obvious bleeding in her abdomen bothers her but not as bad as its been and she has no other complaints  Objective: Vital signs stable afebrile no acute distress I believe she does have some flapping with arm extension and obvious ascites nontender but minimal pedal edema hemoglobin has dropped a little BUN and creatinine are stable paracentesis without signs of infection and probable transudate although albumin not drawn just protein ammonia level yesterday not too high Assessment: Cirrhosis questionably from [place]  Plan: Agree with lactulose dose possibly her inability to keep her eyes open is from encephalopathy will probably need another endoscopy at some point but on Monday we will try to get her previous records and can get a liver MRI at some point but can probably hold off since alpha-fetoprotein is normal and please call me if I can help tomorrow if not we will have rounding team to check on Monday morning and decide how to proceed  [facility]  office [phone] After 5PM or if no answer call [phone]

## 2020-11-14 ENCOUNTER — Encounter (HOSPITAL_COMMUNITY): Payer: Self-pay | Admitting: Internal Medicine

## 2020-11-14 ENCOUNTER — Other Ambulatory Visit (HOSPITAL_COMMUNITY): Payer: BC Managed Care – PPO

## 2020-11-14 ENCOUNTER — Inpatient Hospital Stay (HOSPITAL_COMMUNITY): Payer: BC Managed Care – PPO

## 2020-11-14 LAB — COMPREHENSIVE METABOLIC PANEL
ALT: 31 U/L (ref 0–44)
AST: 93 U/L — ABNORMAL HIGH (ref 15–41)
Albumin: 3.3 g/dL — ABNORMAL LOW (ref 3.5–5.0)
Alkaline Phosphatase: 87 U/L (ref 38–126)
Anion gap: 12 (ref 5–15)
BUN: 29 mg/dL — ABNORMAL HIGH (ref 6–20)
CO2: 33 mmol/L — ABNORMAL HIGH (ref 22–32)
Calcium: 9.1 mg/dL (ref 8.9–10.3)
Chloride: 88 mmol/L — ABNORMAL LOW (ref 98–111)
Creatinine, Ser: 1.02 mg/dL — ABNORMAL HIGH (ref 0.44–1.00)
GFR, Estimated: >60 mL/min (ref >60)
Glucose, Bld: 116 mg/dL — ABNORMAL HIGH (ref 70–99)
Potassium: 2.7 mmol/L — CL (ref 3.5–5.1)
Sodium: 133 mmol/L — ABNORMAL LOW (ref 135–145)
Total Bilirubin: 18.5 mg/dL (ref 0.3–1.2)
Total Protein: 7.4 g/dL (ref 6.5–8.1)

## 2020-11-14 LAB — CBC WITH DIFFERENTIAL/PLATELET
Abs Immature Granulocytes: 0.12 10*3/uL — ABNORMAL HIGH (ref 0.00–0.07)
Basophils Absolute: 0 10*3/uL (ref 0.0–0.1)
Basophils Relative: 0 %
Eosinophils Absolute: 0 10*3/uL (ref 0.0–0.5)
Eosinophils Relative: 0 %
HCT: 19.8 % — ABNORMAL LOW (ref 36.0–46.0)
Hemoglobin: 7 g/dL — ABNORMAL LOW (ref 12.0–15.0)
Immature Granulocytes: 1 %
Lymphocytes Relative: 15 %
Lymphs Abs: 1.8 10*3/uL (ref 0.7–4.0)
MCH: 42.7 pg — ABNORMAL HIGH (ref 26.0–34.0)
MCHC: 35.4 g/dL (ref 30.0–36.0)
MCV: 120.7 fL — ABNORMAL HIGH (ref 80.0–100.0)
Monocytes Absolute: 0.9 10*3/uL (ref 0.1–1.0)
Monocytes Relative: 8 %
Neutro Abs: 9.2 10*3/uL — ABNORMAL HIGH (ref 1.7–7.7)
Neutrophils Relative %: 76 %
Platelets: UNDETERMINED 10*3/uL (ref 150–400)
RBC: 1.64 MIL/uL — ABNORMAL LOW (ref 3.87–5.11)
WBC: 12 10*3/uL — ABNORMAL HIGH (ref 4.0–10.5)
nRBC: 0.6 % — ABNORMAL HIGH (ref 0.0–0.2)

## 2020-11-14 LAB — GLUCOSE, CAPILLARY
Glucose-Capillary: 113 mg/dL — ABNORMAL HIGH (ref 70–99)
Glucose-Capillary: 104 mg/dL — ABNORMAL HIGH (ref 70–99)
Glucose-Capillary: 112 mg/dL — ABNORMAL HIGH (ref 70–99)
Glucose-Capillary: 92 mg/dL (ref 70–99)

## 2020-11-14 LAB — ANTI-SMOOTH MUSCLE ANTIBODY, IGG: F-Actin IgG: 21 Units — ABNORMAL HIGH (ref 0–19)

## 2020-11-14 LAB — AMMONIA: Ammonia: 84 umol/L — ABNORMAL HIGH (ref 9–35)

## 2020-11-14 LAB — CERULOPLASMIN: Ceruloplasmin: 23.2 mg/dL (ref 19.0–39.0)

## 2020-11-14 LAB — MITOCHONDRIAL ANTIBODIES: Mitochondrial M2 Ab, IgG: <20 Units (ref 0.0–20.0)

## 2020-11-14 LAB — PROTIME-INR
INR: 2.2 — ABNORMAL HIGH (ref 0.8–1.2)
Prothrombin Time: 23.8 seconds — ABNORMAL HIGH (ref 11.4–15.2)

## 2020-11-14 LAB — PREPARE RBC (CROSSMATCH)

## 2020-11-14 LAB — ALPHA-1-ANTITRYPSIN: A-1 Antitrypsin, Ser: 201 mg/dL — ABNORMAL HIGH (ref 101–187)

## 2020-11-14 MED ORDER — IOHEXOL 9 MG/ML PO SOLN
500.0000 mL | ORAL | Status: AC
  Administered 2020-11-14 (×2): 500 mL via ORAL

## 2020-11-14 MED ORDER — LIDOCAINE HCL (PF) 1 % IJ SOLN
INTRAMUSCULAR | Status: AC
  Filled 2020-11-14: qty 30

## 2020-11-14 MED ORDER — ALBUMIN HUMAN 25 % IV SOLN
25.0000 g | Freq: Four times a day (QID) | INTRAVENOUS | Status: AC
  Administered 2020-11-14 – 2020-11-16 (×7): 25 g via INTRAVENOUS
  Filled 2020-11-14 (×7): qty 100

## 2020-11-14 MED ORDER — SODIUM CHLORIDE 0.9% IV SOLUTION: Freq: Once | INTRAVENOUS | Status: DC

## 2020-11-14 MED ORDER — LACTULOSE 10 GM/15ML PO SOLN
30.0000 g | Freq: Two times a day (BID) | ORAL | Status: DC
  Administered 2020-11-14 – 2020-11-17 (×6): 30 g via ORAL
  Filled 2020-11-14 (×6): qty 45

## 2020-11-14 MED ORDER — POTASSIUM CHLORIDE CRYS ER 20 MEQ PO TBCR
40.0000 meq | EXTENDED_RELEASE_TABLET | ORAL | Status: AC
  Administered 2020-11-14 (×2): 40 meq via ORAL
  Filled 2020-11-14 (×2): qty 2

## 2020-11-14 NOTE — Progress Notes (Signed)
   Palliative Medicine Inpatient Follow Up Note  Reason for consult: Goals of Care "Goals of Care Clarity"   HPI:  Per intake H&P --> Shelby Conner a 49 y.o.femalewith medical history significant offatty liver, diabetes, protein C and S deficiency presents with progressive abdominal pain for the past 1 to 2 weeks. Patient states that she has progressive abdominal pain for the past 1 to 2 weeks. She is also noted progressive jaundice and abdominal distention for the past week. She reports nausea vomiting.She reports some mild shortness of breath and chest pain that appear to be related to inability to take deep breaths because of abdominal pain. She denies fevers, constipation, diarrhea. She states that she is an occasional drinker but has no significant alcohol use beyond that. She does not use significant acetaminophen. Does have a history of fatty liver which was diagnosed 6 years ago per her report.  Palliative care was asked to get involved in the setting of a cirrhosis diagnosis.  We were asked to speak with the patient and her husband to identify whether or not there was enough social support to consider transplantation.  Today's Discussion (11/14/2020): Chart reviewed.  I met with Shelby Conner at bedside, she expressed feeling more clear minded today. She shares that has had multiple loose "foamy" bowel movements overnight. Educated on the medications that she is on and how these contribute to this. Abdominal distention is slightly better today though still pronounced. Patient able to mobilize without assistance to commode - seems to have some decrease quadricept strength --> talked about PT/OT evaluation. Patient shares some of her hopes for improvements and fears of dying. Offered support through therapeutic listening.   Advanced Directives complete - placed on front of the chart to be notarized Monday.   Objective Assessment: Vital Signs Vitals:   11/13/20 1949  11/14/20 0300  BP: 111/69 102/65  Pulse: 86 88  Resp: 18 18  Temp: 98.6 F (37 C) 98.8 F (37.1 C)  SpO2: 95% 91%    Intake/Output Summary (Last 24 hours) at 11/14/2020 5726 Last data filed at 11/14/2020 2035 Gross per 24 hour  Intake 1350 ml  Output 152 ml  Net 1198 ml   Last Weight  Most recent update: 11/14/2020  6:47 AM   Weight  77 kg (169 lb 12.1 oz)           Gen:  F in NAD HEENT: Yellow sclera, moist mucous membranes CV: Regular rate and rhythm  PULM: 2LPM [place], clear to auscultation bilaterally  ABD: Notably distended EXT: No edema  Neuro: Alert and oriented x4 - Intermittently confused and slow to respond  SUMMARY OF RECOMMENDATIONS Full Code / Full Scope of Treatment  Patient would be interested in liver transplant if offered  Advance directives complete --> will be notarized on Monday with the help of our spiritual team.  Incremental PMT Support  Time Spent: 25 Greater than 50% of the time was spent in counseling and coordination of care ______________________________________________________________________________________ [place] Team Team Cell Phone: [phone] Please utilize secure chat with additional questions, if there is no response within 30 minutes please call the above phone number  Palliative Medicine Team providers are available by phone from 7am to 7pm daily and can be reached through the team cell phone.  Should this patient require assistance outside of these hours, please call the patient's attending physician.

## 2020-11-14 NOTE — Progress Notes (Signed)
Patient up to bedside commode, unsteady and having uncontrolled loose stools, abdomen distended and firm. Nursing staff at bedside .

## 2020-11-14 NOTE — Progress Notes (Signed)
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  Presents with progressively worsening abdominal pain.  Found to have acute on chronic liver disease rapidly progressing to liver failure. GI consulted. At the request of the GI palliative care as well as nephrology were also considered. SP paracentesis. Currently plan is continue current care under GI guidance.  Assessment and Plan: 1.  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 Initially it was significantly high.  Likely the improvement is secondary to improvement in the renal function. Appreciate GI consultation. Currently initiating further work-up. Alpha-1 antitrypsin normal, ceruloplasmin 23.2 normal, mitochondrial antibodies normal.  Asma week +21.  ANA currently pending. SP 2.5 L of paracentesis. On IV ceftriaxone. Follow-up on cultures.  So far negative.  Abdominal pain improving. Significant asterixis on 11/13/2020. Currently significant improvement in 11/14/2020. Continue lactulose at a reduced dose. Avoid hepatotoxic medication. IV albumin 25 g every 6 hours for now. AFP negative.  Cytology pending. GI considering potential EGD as well as potential MRI liver for further work-up. Per my discussion with the GI patient may require transplant although the timing of the duration of the transplant depending on her severity of the illness which is still not determined yet.  2.  Acute kidney injury. Hypokalemia. Etiology not clear.  At risk for hepatorenal syndrome.  Further work-up initiated.  Nephrology consult.  Will monitor response.  3.  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 Currently holding. May require initiation of IV heparin.  4.  Type 2 diabetes mellitus On Metformin at home. Hemoglobin A1c 4.0. On sliding scale insulin.  5.  Goals of care. Appreciate palliative care assistance. Continue full code. Patient agreeable for liver transplant if offered.  6.  Acute on chronic anemia. No acute bleeding. B12 normal. Reticulocyte count is elevated. CBC dropped down to 6.8, after 1 PRBC transfusion came up to 7.4 only with repeat one 7.8 and a repeat one 7.0. Patient also reports more abdominal distention. CT abdomen performed which does not show any evidence of intra-abdominal bleeding so far. Transfuse 1 more PRBC on 11/14/2020. Folic acid significantly low.  Will replace. B12 normal.  7.  Hypokalemia. Potassium 2.7.  Currently being replaced.  Will monitor.  8.  Obesity. Placing the patient at high risk for poor outcomes. Patient has Elita Boone Body mass index is 32.07 kg/m.  Nutrition Problem: Increased nutrient needs Etiology: chronic illness (cirrhosis) Interventions: Interventions: Ensure Enlive (each supplement provides 350kcal and 20 grams of protein), MVI  9.  Alcohol use. At her baseline patient drinks 3 glasses of wine on a daily basis. She denies having to drink more than that on a daily basis. She resolves not to drink anymore going forward.  10.  Splenomegaly. Platelet clumping/thrombocytopenia. Continue to monitor as the patient remains at risk for bleeding even her anticoagulation history as well as thrombocytopenia and liver disease.  Monitor.  Diet: Low-sodium diet DVT Prophylaxis:       Advance goals of care discussion: Full code  Family Communication: no family was present at bedside, at the time of interview.  Discussed with husband on the phone.  Disposition:  Status is: Inpatient  Remains inpatient appropriate because:IV treatments appropriate due to intensity of illness  or inability to take PO   Dispo:  The patient is from: Home              Anticipated d/c is to: Home              Anticipated d/c date is: 3 days              Patient currently is not medically stable to d/c.  Subjective: Confusion significantly better.  Asked more questions regarding her condition.  No nausea no vomiting.  Explained at length regarding her presentation.  Physical Exam:  General: Appear in mild distress, no Rash; Oral Mucosa Clear, moist. no Abnormal Neck Mass Or lumps, Conjunctiva normal  Cardiovascular: S1 and S2 Present, no Murmur, Respiratory: good respiratory effort, Bilateral Air entry present and CTA, no Crackles, no wheezes Abdomen: Bowel Sound present, Soft and Distended, diffusely tender tenderness Extremities: no Pedal edema Neurology: alert and oriented to time, place, and person affect appropriate. no new focal deficit, No asterixis Gait not checked due to patient safety concerns   Vitals:   11/14/20 0600 11/14/20 1100 11/14/20 1500 11/14/20 1949  BP:  106/77 110/68 99/61  Pulse:  85 80 91  Resp:  18 18 17   Temp:  98.3 F (36.8 C) 98.2 F (36.8 C) 98.8 F (37.1 C)  TempSrc:  Oral Oral Oral  SpO2:  95% 95% 94%  Weight: 77 kg     Height:        Intake/Output Summary (Last 24 hours) at 11/14/2020 2026 Last data filed at 11/14/2020 1200 Gross per 24 hour  Intake 780 ml  Output 2 ml  Net 778 ml   Filed Weights   11/12/20 0002 11/13/20 0500 11/14/20 0600  Weight: 74.3 kg 74.3 kg 77 kg    Data Reviewed: I have personally reviewed and interpreted daily labs, tele strips, imagings as discussed above. I reviewed all nursing notes, pharmacy notes, vitals, pertinent old records I have discussed plan of care as described above with RN and patient/family.  CBC: Recent Labs  Lab 11/12/20 0243 11/13/20 0148 11/13/20 1545 11/13/20 1814 11/14/20 0633  WBC 14.6* 13.6* 12.6* 13.9* 12.0*  NEUTROABS  --   --   --   --  9.2*  HGB 7.5* 6.8* 7.4* 7.8* 7.0*  HCT 21.5* 19.0* 20.1* 21.8*  19.8*  MCV 139.6* 136.7* 118.2* 121.1* 120.7*  PLT PLATELET CLUMPS NOTED ON SMEAR, UNABLE TO ESTIMATE PLATELET CLUMPS NOTED ON SMEAR, UNABLE TO ESTIMATE PLATELET CLUMPS NOTED ON SMEAR, UNABLE TO ESTIMATE 101* PLATELET CLUMPS NOTED ON SMEAR, UNABLE TO ESTIMATE   Basic Metabolic Panel: Recent Labs  Lab 11/11/20 1612 11/12/20 0243 11/13/20 0148 11/13/20 1545 11/14/20 0633  NA 128* 128* 130* 129* 133*  K 2.4* 3.7 2.9* 3.4* 2.7*  CL 76* 80* 82* 84* 88*  CO2 36* 33* 33* 32 33*  GLUCOSE 90 90 108* 113* 116*  BUN 20 23* 29* 30* 29*  CREATININE 1.32* 1.39* 1.27* 1.22* 1.02*  CALCIUM 8.9 8.5* 9.0 9.1 9.1  MG  --  2.5*  --  2.4  --     Studies: CT ABDOMEN PELVIS WO CONTRAST  Result Date: 11/14/2020 CLINICAL DATA:  Worsening abdominal pain with acute on chronic liver disease progressing to liver failure. Anemia. EXAM: CT ABDOMEN AND PELVIS WITHOUT CONTRAST TECHNIQUE: Multidetector CT imaging of the abdomen and pelvis was performed following the standard protocol without IV contrast. COMPARISON:  CT dated November 11, 2020 FINDINGS: Lower chest: There is atelectasis at the  lung bases with trace bilateral pleural effusions.The heart size is mildly enlarged. The intracardiac blood pool is hypodense relative to the adjacent myocardium consistent with anemia. Hepatobiliary: There is severe hepatic steatosis with evidence for cirrhosis. There is a heterogeneous appearance of the hepatic echogenicity, especially in the right hepatic lobe. This is similar to prior study and is likely related to heterogeneous hepatic steatosis. Cholelithiasis without acute inflammation.There is no biliary ductal dilation. Pancreas: Normal contours without ductal dilatation. No peripancreatic fluid collection. Spleen: The spleen is enlarged measuring nearly 14 cm craniocaudad. Adrenals/Urinary Tract: --Adrenal glands: Unremarkable. --Right kidney/ureter: The right kidney is a striated appearance with suggestion of ring  calcifications. There is no hydronephrosis. --Left kidney/ureter: There is a heterogeneous appearance of the left kidney without evidence for hydronephrosis. There is suggestion of subtle ring calcifications. --Urinary bladder: Unremarkable. Stomach/Bowel: --Stomach/Duodenum: No hiatal hernia or other gastric abnormality. Normal duodenal course and caliber. --Small bowel: Unremarkable. --Colon: There is liquid stool throughout the colon. There is gaseous distention of the colon. --Appendix: Normal. Vascular/Lymphatic: Atherosclerotic calcification is present within the non-aneurysmal abdominal aorta, without hemodynamically significant stenosis. A left common iliac stent is noted. There is an IVC filter in place. Within the central portion of the IVC filter, there are calcifications suggestive of chronic thrombus. There are calcifications within the left common iliac vein stent suggestive of chronic thrombus. --No retroperitoneal lymphadenopathy. --No mesenteric lymphadenopathy. --No pelvic or inguinal lymphadenopathy. Reproductive: Unremarkable Other: There is a small volume of free fluid in the abdomen and pelvis. There is no free air. Musculoskeletal. 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 IMPRESSION: 1. There is liquid stool throughout the colon. There is gaseous distention of the colon. Findings may be secondary to a diarrheal illness. 2. Severe hepatic steatosis and cirrhosis with stigmata of portal hypertension including splenomegaly and small volume ascites. 3. Cholelithiasis without acute inflammation. 4. Abnormal appearance of both kidneys which may represent retained contrast from the patient's prior contrast enhanced examination versus less likely renal papillary necrosis. 5. 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 Aortic Atherosclerosis (ICD10-I70.0). Electronically Signed   By: Katherine Mantle M.D.   On: 11/14/2020 19:11    Scheduled Meds: . sodium  chloride   Intravenous Once  . feeding supplement  237 mL Oral TID BM  . influenza vac split quadrivalent PF  0.5 mL Intramuscular Tomorrow-1000  . insulin aspart  0-9 Units Subcutaneous TID WC  . lactulose  30 g Oral BID  . multivitamin with minerals  1 tablet Oral Daily  . pneumococcal 23 valent vaccine  0.5 mL Intramuscular Tomorrow-1000  . sodium chloride flush  3 mL Intravenous Q12H   Continuous Infusions: . albumin human 25 g (11/14/20 1356)  . cefTRIAXone (ROCEPHIN)  IV 2 g (11/13/20 2300)   PRN Meds:   Time spent: 1 hour.  9 30-10 30 a.m.  Author: Lynden Oxford, MD [facility] 11/14/2020 8:26 PM  To reach On-call, see care teams to locate the attending and reach out via [email or web address]. Between 7PM-7AM, please contact night-coverage If you still have difficulty reaching the attending provider, please page the [facility] (Director on Call) for [facility] on amion for assistance.

## 2020-11-14 NOTE — Progress Notes (Signed)
PT Cancellation Note  Patient Details Name: Shelby Conner MRN: [id number] DOB: 09-Nov-1971   Cancelled Treatment:    Reason Eval/Treat Not Completed: Patient declined, no reason specified Pt reports there is no way she can work with PT because of her abdomen. Requesting to attempt tomorrow. Will follow up as schedule allows.   Farley Ly, PT, DPT  Acute Rehabilitation Services  Pager: [phone] Office: [phone]    [name] 11/14/2020, 2:13 PM

## 2020-11-14 NOTE — Progress Notes (Signed)
[facility] ROUNDING NOTE   Subjective:   Brief history: 49 year old lady with history of diabetes protein C protein S deficiency has decompensated liver disease.  Status post paracentesis 11/11/2020.  Hyperbilirubinemia 19.6.  No use of nonsteroidal inflammatories ACE inhibitor's ARB use underwent contrast-enhanced study 11/11/2020.  Urine output 150 cc recorded 11/13/2020 status post transfusion 1 unit packed red blood cells 11/13/2020.   Subjective: Noted increased abdominal distention.  Blood pressure 102/65 temperature 98.8 O2 sats 90% 3 L nasal cannula  Sodium 133 potassium 2.7 chloride 88 CO2 33 BUN 29 creatinine 1 glucose 116 calcium 9.1 albumin 3.3 hemoglobin 7.0   Objective:  Vital signs in last 24 hours:  Temp:  [97.5 F (36.4 C)-98.8 F (37.1 C)] 98.8 F (37.1 C) (11/21 0300) Pulse Rate:  [86-88] 88 (11/21 0300) Resp:  [16-18] 18 (11/21 0300) BP: (102-111)/(62-69) 102/65 (11/21 0300) SpO2:  [91 %-95 %] 91 % (11/21 0300) Weight:  [77 kg] 77 kg (11/21 0600)  Weight change: 2.7 kg Filed Weights   11/12/20 0002 11/13/20 0500 11/14/20 0600  Weight: 74.3 kg 74.3 kg 77 kg    Intake/Output: I/O last 3 completed shifts: In: 1650 [P.O.:900; Blood:350; IV Piggyback:400] Out: 152 [Urine:151; Stool:1]   Intake/Output this shift:  No intake/output data recorded.  General: Ill-appearing using nasal cannula nondistressed HEENT: Normocephalic atraumatic mucous membranes moist Eyes: Scleral icterus extraocular movements intact Neck: JVP not elevated no thyromegaly Heart: Regular rate and rhythm no murmurs rubs or gallops Lungs: Clear to auscultation no wheeze or rales normal breath sounds Abdomen: Abdominal distention abdominal tenderness no guarding no rebound Extremities: No cyanosis clubbing or edema Skin: Jaundiced Neuro: Lethargic with asterixis   Basic Metabolic Panel: Recent Labs  Lab 11/11/20 1612 11/11/20 1612 11/12/20 0243 11/12/20 0243  11/13/20 0148 11/13/20 1545 11/14/20 0633  NA 128*  --  128*  --  130* 129* 133*  K 2.4*  --  3.7  --  2.9* 3.4* 2.7*  CL 76*  --  80*  --  82* 84* 88*  CO2 36*  --  33*  --  33* 32 33*  GLUCOSE 90  --  90  --  108* 113* 116*  BUN 20  --  23*  --  29* 30* 29*  CREATININE 1.32*  --  1.39*  --  1.27* 1.22* 1.02*  CALCIUM 8.9   < > 8.5*   < > 9.0 9.1 9.1  MG  --   --  2.5*  --   --  2.4  --    < > = values in this interval not displayed.    Liver Function Tests: Recent Labs  Lab 11/11/20 1612 11/12/20 0243 11/13/20 0148 11/14/20 0633  AST 126* 112* 101* 93*  ALT 44 39 36 31  ALKPHOS 167* 129* 119 87  BILITOT 19.8* 18.3* 19.6* 18.5*  PROT 8.7* 7.6 7.5 7.4  ALBUMIN 2.3* 2.0* 2.6* 3.3*   Recent Labs  Lab 11/11/20 1612  LIPASE 82*   Recent Labs  Lab 11/12/20 1027 11/14/20 0633  AMMONIA 36* 84*    CBC: Recent Labs  Lab 11/12/20 0243 11/13/20 0148 11/13/20 1545 11/13/20 1814 11/14/20 0633  WBC 14.6* 13.6* 12.6* 13.9* 12.0*  NEUTROABS  --   --   --   --  9.2*  HGB 7.5* 6.8* 7.4* 7.8* 7.0*  HCT 21.5* 19.0* 20.1* 21.8* 19.8*  MCV 139.6* 136.7* 118.2* 121.1* 120.7*  PLT PLATELET CLUMPS NOTED ON SMEAR, UNABLE TO ESTIMATE PLATELET CLUMPS NOTED  ON SMEAR, UNABLE TO ESTIMATE PLATELET CLUMPS NOTED ON SMEAR, UNABLE TO ESTIMATE 101* PLATELET CLUMPS NOTED ON SMEAR, UNABLE TO ESTIMATE    Cardiac Enzymes: No results for input(s): CKTOTAL, CKMB, CKMBINDEX, TROPONINI in the last 168 hours.  BNP: Invalid input(s): POCBNP  CBG: Recent Labs  Lab 11/12/20 2037 11/13/20 0645 11/13/20 1211 11/13/20 1744 11/14/20 0644  GLUCAP 115* 110* 103* 119* 113*    Microbiology: Results for orders placed or performed during the hospital encounter of 11/11/20  Respiratory Panel by RT PCR (Flu A&B, Covid) - Nasopharyngeal Swab     Status: None   Collection Time: 11/11/20  9:04 PM   Specimen: Nasopharyngeal Swab; Nasopharyngeal(NP) swabs in vial transport medium  Result Value Ref  Range Status   SARS Coronavirus 2 by RT PCR NEGATIVE NEGATIVE Final    Comment: (NOTE) SARS-CoV-2 target nucleic acids are NOT DETECTED.  The SARS-CoV-2 RNA is generally detectable in upper respiratoy specimens during the acute phase of infection. The lowest concentration of SARS-CoV-2 viral copies this assay can detect is 131 copies/mL. A negative result does not preclude SARS-Cov-2 infection and should not be used as the sole basis for treatment or other patient management decisions. A negative result may occur with  improper specimen collection/handling, submission of specimen other than nasopharyngeal swab, presence of viral mutation(s) within the areas targeted by this assay, and inadequate number of viral copies (<131 copies/mL). A negative result must be combined with clinical observations, patient history, and epidemiological information. The expected result is Negative.  Fact Sheet for Patients:  https://www.moore.com/  Fact Sheet for Healthcare Providers:  https://www.young.biz/  This test is no t yet approved or cleared by the [place] FDA and  has been authorized for detection and/or diagnosis of SARS-CoV-2 by FDA under an Emergency Use Authorization (EUA). This EUA will remain  in effect (meaning this test can be used) for the duration of the COVID-19 declaration under Section 564(b)(1) of the Act, 21 U.S.C. section 360bbb-3(b)(1), unless the authorization is terminated or revoked sooner.     Influenza A by PCR NEGATIVE NEGATIVE Final   Influenza B by PCR NEGATIVE NEGATIVE Final    Comment: (NOTE) The Xpert Xpress SARS-CoV-2/FLU/RSV assay is intended as an aid in  the diagnosis of influenza from Nasopharyngeal swab specimens and  should not be used as a sole basis for treatment. Nasal washings and  aspirates are unacceptable for Xpert Xpress SARS-CoV-2/FLU/RSV  testing.  Fact Sheet for  Patients: https://www.moore.com/  Fact Sheet for Healthcare Providers: https://www.young.biz/  This test is not yet approved or cleared by the [place] FDA and  has been authorized for detection and/or diagnosis of SARS-CoV-2 by  FDA under an Emergency Use Authorization (EUA). This EUA will remain  in effect (meaning this test can be used) for the duration of the  Covid-19 declaration under Section 564(b)(1) of the Act, 21  U.S.C. section 360bbb-3(b)(1), unless the authorization is  terminated or revoked. Performed at [facility], [address], [place], [place] [address]   Gram stain     Status: None   Collection Time: 11/12/20 10:13 AM   Specimen: PATH Cytology Peritoneal fluid  Result Value Ref Range Status   Specimen Description PERITONEAL FLUID  Final   Special Requests NONE  Final   Gram Stain   Final    WBC PRESENT,BOTH PMN AND MONONUCLEAR NO ORGANISMS SEEN CYTOSPIN SMEAR Performed at [facility], [address], [place], [place] [address]  Report Status 11/12/2020 FINAL  Final  Culture, body fluid-bottle     Status: None (Preliminary result)   Collection Time: 11/12/20 10:13 AM   Specimen: Peritoneal Washings  Result Value Ref Range Status   Specimen Description PERITONEAL FLUID  Final   Special Requests NONE  Final   Culture   Final    NO GROWTH < 24 HOURS Performed at [facility], [address], [place], [place] [address]    Report Status PENDING  Incomplete    Coagulation Studies: Recent Labs    11/11/20 04-16-22 11/12/20 0243 11/13/20 0148 11/14/20 0633  LABPROT 23.0* 22.4* 22.8* 23.8*  INR 2.1* 2.1* 2.1* 2.2*    Urinalysis: No results for input(s): COLORURINE, LABSPEC, PHURINE, GLUCOSEU, HGBUR, BILIRUBINUR, KETONESUR, PROTEINUR, UROBILINOGEN, NITRITE, LEUKOCYTESUR in the last 72 hours.  Invalid input(s): APPERANCEUR    Imaging: No results found.   Medications:   . albumin  human    . cefTRIAXone (ROCEPHIN)  IV 2 g (11/13/20 2300)   . feeding supplement  237 mL Oral TID BM  . influenza vac split quadrivalent PF  0.5 mL Intramuscular Tomorrow-1000  . insulin aspart  0-9 Units Subcutaneous TID WC  . lactulose  30 g Oral BID  . lidocaine (PF)      . multivitamin with minerals  1 tablet Oral Daily  . pneumococcal 23 valent vaccine  0.5 mL Intramuscular Tomorrow-1000  . potassium chloride  40 mEq Oral Q2H  . sodium chloride flush  3 mL Intravenous Q12H   oxyCODONE, traMADol  Assessment/ Plan:  1.Acute kidney injury.  No evidence of obstruction on CT scan 11/11/2020.  Patient has severe liver disease of the question is whether this could represent hepatorenal syndrome.  She did receive some IV contrast on 11/11/2020.  Bilirubin is elevated and therefore bile pigment nephropathy is in the differential .There does not appear to be the administration of other nephrotoxins.  She was started on ceftriaxone 11/11/2020.  Blood pressures are low with no evidence of shock.  Creatinine improved I doubt this represents hepatorenal syndrome.  Urine sodium is pending.  Does not appear to be collected at this point.  Volume expansion with albumin is reasonable. 2. Hypertension/volume  -continue volume expansion with IV albumin 3.  Protein C and S deficiency currently holding Xarelto with drop in hemoglobin.  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 4.  Suspected SBP with acute on chronic liver disease and acute decompensation.  Appreciate assistance from Dr. Matthias Hughs for gastroenterology. 5.  Diabetes mellitus continues to stop Metformin continue insulin sliding scale 6.  Hypokalemia most likely increased activation of RAS and mineralocorticoid secondary to acute liver decompensation.  Agree with repletion. 7.  Acute on chronic liver disease.  We will continue to follow.  Status post paracentesis 2.5 L.  Lactulose 30 g twice daily trending LFTs additional testing to rule out other causes of  cirrhosis  I am not sure that there is a role for nephrology at this point.  It does not look like pattern around renal syndrome as a renal function appears to be improved since admission.  She is now back to baseline.  She does continue to be medically unstable from a medical standpoint.  Fortunately this does not seem to involve renal system at this point.  I will sign off patient.   LOS: 3 Shelby Conner @TODAY @10 :27 AM

## 2020-11-15 LAB — GLUCOSE, CAPILLARY
Glucose-Capillary: 129 mg/dL — ABNORMAL HIGH (ref 70–99)
Glucose-Capillary: 110 mg/dL — ABNORMAL HIGH (ref 70–99)
Glucose-Capillary: 118 mg/dL — ABNORMAL HIGH (ref 70–99)
Glucose-Capillary: 101 mg/dL — ABNORMAL HIGH (ref 70–99)
Glucose-Capillary: 96 mg/dL (ref 70–99)

## 2020-11-15 LAB — CBC WITH DIFFERENTIAL/PLATELET
Abs Immature Granulocytes: 0.21 10*3/uL — ABNORMAL HIGH (ref 0.00–0.07)
Basophils Absolute: 0 10*3/uL (ref 0.0–0.1)
Basophils Relative: 0 %
Eosinophils Absolute: 0 10*3/uL (ref 0.0–0.5)
Eosinophils Relative: 0 %
HCT: 23.4 % — ABNORMAL LOW (ref 36.0–46.0)
Hemoglobin: 8.3 g/dL — ABNORMAL LOW (ref 12.0–15.0)
Immature Granulocytes: 2 %
Lymphocytes Relative: 16 %
Lymphs Abs: 1.8 10*3/uL (ref 0.7–4.0)
MCH: 41.1 pg — ABNORMAL HIGH (ref 26.0–34.0)
MCHC: 35.5 g/dL (ref 30.0–36.0)
MCV: 115.8 fL — ABNORMAL HIGH (ref 80.0–100.0)
Monocytes Absolute: 0.8 10*3/uL (ref 0.1–1.0)
Monocytes Relative: 7 %
Neutro Abs: 8.6 10*3/uL — ABNORMAL HIGH (ref 1.7–7.7)
Neutrophils Relative %: 75 %
Platelets: UNDETERMINED 10*3/uL (ref 150–400)
RBC: 2.02 MIL/uL — ABNORMAL LOW (ref 3.87–5.11)
WBC: 11.6 10*3/uL — ABNORMAL HIGH (ref 4.0–10.5)
nRBC: 0.5 % — ABNORMAL HIGH (ref 0.0–0.2)

## 2020-11-15 LAB — BPAM RBC
Blood Product Expiration Date: 202112112359
Blood Product Expiration Date: 202112242359
ISSUE DATE / TIME: 202111200814
ISSUE DATE / TIME: 202111212144
Unit Type and Rh: 5100
Unit Type and Rh: 5100

## 2020-11-15 LAB — URINALYSIS, COMPLETE (UACMP) WITH MICROSCOPIC
Glucose, UA: NEGATIVE mg/dL
Ketones, ur: NEGATIVE mg/dL
Leukocytes,Ua: NEGATIVE
Nitrite: NEGATIVE
Protein, ur: 30 mg/dL — AB
Specific Gravity, Urine: 1.027 (ref 1.005–1.030)
pH: 5 (ref 5.0–8.0)

## 2020-11-15 LAB — TYPE AND SCREEN
ABO/RH(D): O POS
Antibody Screen: NEGATIVE
Unit division: 0
Unit division: 0

## 2020-11-15 LAB — OSMOLALITY, URINE: Osmolality, Ur: 451 mOsm/kg (ref 300–900)

## 2020-11-15 LAB — PROTIME-INR
INR: 2.2 — ABNORMAL HIGH (ref 0.8–1.2)
Prothrombin Time: 23.4 seconds — ABNORMAL HIGH (ref 11.4–15.2)

## 2020-11-15 LAB — SODIUM, URINE, RANDOM: Sodium, Ur: <10 mmol/L

## 2020-11-15 LAB — COMPREHENSIVE METABOLIC PANEL
ALT: 28 U/L (ref 0–44)
AST: 91 U/L — ABNORMAL HIGH (ref 15–41)
Albumin: 4.2 g/dL (ref 3.5–5.0)
Alkaline Phosphatase: 79 U/L (ref 38–126)
Anion gap: 13 (ref 5–15)
BUN: 23 mg/dL — ABNORMAL HIGH (ref 6–20)
CO2: 28 mmol/L (ref 22–32)
Calcium: 9.4 mg/dL (ref 8.9–10.3)
Chloride: 92 mmol/L — ABNORMAL LOW (ref 98–111)
Creatinine, Ser: 0.91 mg/dL (ref 0.44–1.00)
GFR, Estimated: >60 mL/min (ref >60)
Glucose, Bld: 100 mg/dL — ABNORMAL HIGH (ref 70–99)
Potassium: 2.9 mmol/L — ABNORMAL LOW (ref 3.5–5.1)
Sodium: 133 mmol/L — ABNORMAL LOW (ref 135–145)
Total Bilirubin: 17.3 mg/dL — ABNORMAL HIGH (ref 0.3–1.2)
Total Protein: 7.6 g/dL (ref 6.5–8.1)

## 2020-11-15 LAB — CYTOLOGY - NON PAP

## 2020-11-15 LAB — CREATININE, URINE, RANDOM: Creatinine, Urine: 184.5 mg/dL

## 2020-11-15 MED ORDER — PANTOPRAZOLE SODIUM 40 MG IV SOLR
40.0000 mg | Freq: Two times a day (BID) | INTRAVENOUS | Status: DC
  Administered 2020-11-15 – 2020-11-16 (×3): 40 mg via INTRAVENOUS
  Filled 2020-11-15 (×3): qty 40

## 2020-11-15 MED ORDER — SODIUM CHLORIDE 0.9 % IV SOLN: INTRAVENOUS | Status: DC

## 2020-11-15 MED ORDER — POTASSIUM CHLORIDE 10 MEQ/100ML IV SOLN
10.0000 meq | INTRAVENOUS | Status: AC
  Administered 2020-11-15 (×3): 10 meq via INTRAVENOUS
  Filled 2020-11-15 (×3): qty 100

## 2020-11-15 MED ORDER — POTASSIUM CHLORIDE CRYS ER 20 MEQ PO TBCR
40.0000 meq | EXTENDED_RELEASE_TABLET | Freq: Once | ORAL | Status: AC
  Administered 2020-11-15: 40 meq via ORAL
  Filled 2020-11-15: qty 2

## 2020-11-15 MED ORDER — POTASSIUM CHLORIDE 10 MEQ/100ML IV SOLN
10.0000 meq | Freq: Once | INTRAVENOUS | Status: AC
  Administered 2020-11-15: 10 meq via INTRAVENOUS
  Filled 2020-11-15: qty 100

## 2020-11-15 MED ORDER — POTASSIUM CHLORIDE CRYS ER 20 MEQ PO TBCR: 40.0000 meq | EXTENDED_RELEASE_TABLET | ORAL | Status: DC

## 2020-11-15 NOTE — Progress Notes (Signed)
This chaplain responded to PMT consult for spiritual care.  The chaplain understands the Pt. is exhausted and prefers to rest.  The chaplain will F/U with notarizing the Pt. AD and spiritual care on Tuesday.

## 2020-11-15 NOTE — H&P (View-Only) (Signed)
[facility] Progress Note  Shelby Conner 49 y.o. 12/04/1971  CC:  Decompensated cirrhosis  Subjective: Patient denies any abdominal pain.  Denies nausea or vomiting.  Denies rectal bleeding or melena.  Is tolerating a regular diet.  ROS : Review of Systems  Respiratory: Negative for shortness of breath.   Cardiovascular: Negative for chest pain and palpitations.  Gastrointestinal: Negative for abdominal pain, blood in stool, constipation, diarrhea, heartburn, melena, nausea and vomiting.   Objective: Vital signs in last 24 hours: Vitals:   11/15/20 0200 11/15/20 0836  BP: 104/72 108/78  Pulse: 85 90  Resp: 16 20  Temp: 97.9 F (36.6 C) 98.5 F (36.9 C)  SpO2: 90% 90%    Physical Exam:  General:  Jaundice, alert, cooperative, no distress, appears stated age  Head:  Normocephalic, without obvious abnormality, atraumatic  Eyes:  Deep icterus, EOMs intact  Lungs:   Clear to auscultation bilaterally, respirations unlabored  Heart:  Regular rate and rhythm, S1, S2 normal  Abdomen:   Soft, moderately distended, non-tender, bowel sounds active all four quadrants,  no masses,   Extremities: Extremities normal, atraumatic, no  edema  Neuro: Alert and oriented; asterixis no longer present    Lab Results: Recent Labs    11/13/20 1545 11/14/20 0633  NA 129* 133*  K 3.4* 2.7*  CL 84* 88*  CO2 32 33*  GLUCOSE 113* 116*  BUN 30* 29*  CREATININE 1.22* 1.02*  CALCIUM 9.1 9.1  MG 2.4  --    Recent Labs    11/13/20 0148 11/14/20 0633  AST 101* 93*  ALT 36 31  ALKPHOS 119 87  BILITOT 19.6* 18.5*  PROT 7.5 7.4  ALBUMIN 2.6* 3.3*   Recent Labs    11/13/20 1814 11/14/20 0633  WBC 13.9* 12.0*  NEUTROABS  --  9.2*  HGB 7.8* 7.0*  HCT 21.8* 19.8*  MCV 121.1* 120.7*  PLT 101* PLATELET CLUMPS NOTED ON SMEAR, UNABLE TO ESTIMATE   Recent Labs    11/13/20 0148 11/14/20 0633  LABPROT 22.8* 23.8*  INR 2.1* 2.2*      Assessment: Decompensated  cirrhosis, 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 Per Dr. Patel, patient now states she drinks 3 glasses of wine per evening.  MELD score of 28 as of 11/14/20. -T bili 18.5/AST 93/ALT 31/ALP 87 as of 11/21 -INR 2.2 as of 11/21 -WBCs 14.6, decreased from 7.5 yesterday -Acute hepatitis panel negative 11/11/2020 -Weak positive ASMA (21), normal ANA and AMA  Anemia -Hgb decreased to 6.8 on 11/20, improved to 7.8 after 1u pRBCs.  Decreased to 7.0 yesterday, was transfused 1u pRBCs.  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  Repeat INR today.  EGD tomorrow.  I thoroughly discussed the procedure with the patient to include nature, alternatives, benefits, and risks (including but not limited to bleeding, infection, perforation, anesthesia/cardiac and pulmonary complications).  Patient verbalized understanding and gave verbal consent to proceed with EGD.  NPO after midnight.  Continue to hold Xarelto.  Continue supportive care.  Continue empiric antibiotics.  [facility] will follow.  Toba Claudio Baron-Johnson PA-C 11/15/2020, 10:05 AM  Contact #  [phone] 

## 2020-11-15 NOTE — Evaluation (Signed)
Physical Therapy Evaluation Patient Details Name: Shelby Conner MRN: [id number] DOB: 1971-10-03 Today's Date: 11/15/2020   History of Present Illness  Patient is a 49 y/o female with PMH of fatty liver, diabetes, protein C and S deficiency presenting with progressing abdominal pain for the past 1 to 2 weeks. Patient is also noted with progressive jaundice and abdominal distention for the past week. Patient has decompensated hepatic cirrhosis. Patient underwent paracentesis from R lateral abdomen on 11/22.  Clinical Impression  PTA, patient lives with husband and young children. Patient reports independence with all mobility. Patient required supervision for bed mobility and sit to stand transfer. Patient ambulated 71' with no AD and min guard for safety due to unsteadiness with ambulation, patient demos waddling gait pattern and decreased stride length. Patient presents with increased pain, generalized weakness, decreased activity tolerance, impaired balance. Patient will benefit from skilled PT services to address listed deficits. Recommend HHPT following discharge to maximize functional independence and safety in the home.     Follow Up Recommendations Home health PT;Supervision - Intermittent    Equipment Recommendations  Rolling Wanita Derenzo with 5" wheels;3in1 (PT)    Recommendations for Other Services       Precautions / Restrictions Precautions Precautions: Fall Restrictions Weight Bearing Restrictions: No      Mobility  Bed Mobility Overal bed mobility: Needs Assistance Bed Mobility: Supine to Sit     Supine to sit: Supervision;HOB elevated          Transfers Overall transfer level: Needs assistance Equipment used: None Transfers: Sit to/from Stand Sit to Stand: Supervision         General transfer comment: supervision for safety  Ambulation/Gait Ambulation/Gait assistance: Min guard Gait Distance (Feet): 30 Feet Assistive device: None Gait  Pattern/deviations: Step-to pattern;Wide base of support;Decreased stride length Gait velocity: decreased   General Gait Details: patient with waddling gait pattern and demos unsteadiness with ambulation especially with turns.   Stairs            Wheelchair Mobility    Modified Rankin (Stroke Patients Only)       Balance Overall balance assessment: Needs assistance Sitting-balance support: No upper extremity supported;Feet supported Sitting balance-Leahy Scale: Good     Standing balance support: No upper extremity supported;During functional activity Standing balance-Leahy Scale: Poor                               Pertinent Vitals/Pain      Home Living Family/patient expects to be discharged to:: Private residence Living Arrangements: Spouse/significant other;Children   Type of Home: House Home Access: Stairs to enter Entrance Stairs-Rails: Doctor, general practice of Steps: 5 Home Layout: One level Home Equipment: None;Grab bars - tub/shower      Prior Function Level of Independence: Independent               Hand Dominance   Dominant Hand: Right    Extremity/Trunk Assessment   Upper Extremity Assessment Upper Extremity Assessment: Defer to OT evaluation    Lower Extremity Assessment Lower Extremity Assessment: Generalized weakness       Communication   Communication: No difficulties  Cognition Arousal/Alertness: Awake/alert Behavior During Therapy: WFL for tasks assessed/performed Overall Cognitive Status: Within Functional Limits for tasks assessed  General Comments General comments (skin integrity, edema, etc.): father present throughout     Exercises     Assessment/Plan    PT Assessment Patient needs continued PT services  PT Problem List Decreased strength;Decreased activity tolerance;Decreased mobility;Decreased balance;Pain       PT Treatment  Interventions DME instruction;Gait training;Stair training;Functional mobility training;Therapeutic activities;Therapeutic exercise;Balance training;Patient/family education    PT Goals (Current goals can be found in the Care Plan section)  Acute Rehab PT Goals Patient Stated Goal: to go home PT Goal Formulation: With patient Time For Goal Achievement: 11/29/20 Potential to Achieve Goals: Good    Frequency Min 3X/week   Barriers to discharge        Co-evaluation PT/OT/SLP Co-Evaluation/Treatment: Yes Reason for Co-Treatment: Complexity of the patient's impairments (multi-system involvement);For patient/therapist safety;Other (comment) (low endurance) PT goals addressed during session: Mobility/safety with mobility;Balance OT goals addressed during session: ADL's and self-care       AM-PAC PT "6 Clicks" Mobility  Outcome Measure Help needed turning from your back to your side while in a flat bed without using bedrails?: None Help needed moving from lying on your back to sitting on the side of a flat bed without using bedrails?: None Help needed moving to and from a bed to a chair (including a wheelchair)?: A Little Help needed standing up from a chair using your arms (e.g., wheelchair or bedside chair)?: None Help needed to walk in hospital room?: A Little Help needed climbing 3-5 steps with a railing? : A Lot 6 Click Score: 20    End of Session   Activity Tolerance: Patient tolerated treatment well Patient left: in chair;with call bell/phone within reach;with chair alarm set;with family/visitor present Nurse Communication: Mobility status PT Visit Diagnosis: Unsteadiness on feet (R26.81);Other abnormalities of gait and mobility (R26.89);Muscle weakness (generalized) (M62.81)    Time: 6979-4801 PT Time Calculation (min) (ACUTE ONLY): 28 min   Charges:   PT Evaluation $PT Eval Low Complexity: 1 Low          Gregor Hams, PT, DPT Acute Rehabilitation Services Pager  [phone] Office [phone]   [name] 11/15/2020, 11:16 AM

## 2020-11-15 NOTE — Plan of Care (Signed)

## 2020-11-15 NOTE — Evaluation (Signed)
Occupational Therapy Evaluation Patient Details Name: Shelby Conner MRN: [id number] DOB: 27-Nov-1971 Today's Date: 11/15/2020    History of Present Illness Patient is a 49 y/o female with PMH of fatty liver, diabetes, protein C and S deficiency presenting with progressing abdominal pain for the past 1 to 2 weeks. Patient is also noted with progressive jaundice and abdominal distention for the past week. Patient has decompensated hepatic cirrhosis. Patient underwent paracentesis from R lateral abdomen on 11/22.   Clinical Impression   PTA, pt lives with husband and young children, reports typically Independent with all daily tasks without use of AD. Pt presents now with deficits in pain, standing balance, endurance and strength. Pt requires overall min guard for mobility, noted unsteadiness without use of AD. Due to deficits, pt requires setup for UB ADLs and Min A for LB ADLs. Educated on compensatory strategies for LB ADLs due to minimize abdominal pain. Anticipate pt to progress well with increased activity, as well as after paracentesis. Pt will continue to benefit from skilled OT services to maximize ADL independence and improve endurance. No OT services needed at DC.     Follow Up Recommendations  No OT follow up;Supervision - Intermittent    Equipment Recommendations  3 in 1 bedside commode;Other (comment) (RW)    Recommendations for Other Services       Precautions / Restrictions Precautions Precautions: Fall Restrictions Weight Bearing Restrictions: No      Mobility Bed Mobility Overal bed mobility: Needs Assistance Bed Mobility: Supine to Sit     Supine to sit: Supervision;HOB elevated     General bed mobility comments: No assist needed    Transfers Overall transfer level: Needs assistance Equipment used: None Transfers: Sit to/from Stand Sit to Stand: Supervision         General transfer comment: supervision for safety, does require min guard for  dynamic tasks without AD    Balance Overall balance assessment: Needs assistance Sitting-balance support: No upper extremity supported;Feet supported Sitting balance-Leahy Scale: Good     Standing balance support: No upper extremity supported;During functional activity Standing balance-Leahy Scale: Fair Standing balance comment: fair static standing, unsteadiness with dynamic tasks and no AD                           ADL either performed or assessed with clinical judgement   ADL Overall ADL's : Needs assistance/impaired Eating/Feeding: Independent;Sitting   Grooming: Supervision/safety;Standing;Wash/dry hands Grooming Details (indicate cue type and reason): Supervision for safety in standing at sink to wash hands, some cues for problem solving needed Upper Body Bathing: Set up;Sitting   Lower Body Bathing: Min guard;Sit to/from stand   Upper Body Dressing : Set up;Sitting   Lower Body Dressing: Minimal assistance;Sit to/from stand;Sitting/lateral leans Lower Body Dressing Details (indicate cue type and reason): Educated on positioning and use of figure four position to alleviate abdominal pain Toilet Transfer: Min guard;Ambulation;Regular Teacher, adult education Details (indicate cue type and reason): min guard with unsteadiness noted without AD Toileting- Clothing Manipulation and Hygiene: Supervision/safety;Sitting/lateral lean;Sit to/from stand Toileting - Clothing Manipulation Details (indicate cue type and reason): Supervision to ensure safety, no physical assist needed     Functional mobility during ADLs: Min guard General ADL Comments: Pt with unsteadiness, low endurance and abdominal pain limiting complete independence with ADLS     Vision Baseline Vision/History: No visual deficits Patient Visual Report: No change from baseline Vision Assessment?: No apparent visual deficits  Perception     Praxis      Pertinent Vitals/Pain Pain Assessment:  Faces Faces Pain Scale: Hurts little more Pain Location: abdomen when bending or leaning forward Pain Descriptors / Indicators: Discomfort;Grimacing Pain Intervention(s): Monitored during session;Repositioned;Patient requesting pain meds-RN notified     Hand Dominance Right   Extremity/Trunk Assessment Upper Extremity Assessment Upper Extremity Assessment: Generalized weakness   Lower Extremity Assessment Lower Extremity Assessment: Defer to PT evaluation   Cervical / Trunk Assessment Cervical / Trunk Assessment: Normal   Communication Communication Communication: No difficulties   Cognition Arousal/Alertness: Awake/alert Behavior During Therapy: WFL for tasks assessed/performed Overall Cognitive Status: Impaired/Different from baseline Area of Impairment: Problem solving                             Problem Solving: Difficulty sequencing;Requires verbal cues General Comments: Overall functional, A&Ox4. Does require cues for problem solving tasks    General Comments  Pt's father present during session, from [place] but can assist pt at home until he leaves town, as well as pt's mother in [place], quantity Instructions      Home Living Family/patient expects to be discharged to:: Private residence Living Arrangements: Spouse/significant other;Children (8 y/o, 68 y/o children)   Type of Home: House Home Access: Stairs to enter Secretary/administrator of Steps: 5 Entrance Stairs-Rails: Right;Left Home Layout: One level     Bathroom Shower/Tub: Chief Strategy Officer: Standard     Home Equipment: None;Grab bars - tub/shower   Additional Comments: Husband works      Prior Functioning/Environment Level of Independence: Independent        Comments: Independent though was having difficulty with daily tasks with recent onset of pain in the past 1-2 weeks        OT Problem List: Decreased strength;Decreased activity  tolerance;Impaired balance (sitting and/or standing);Decreased knowledge of use of DME or AE;Pain      OT Treatment/Interventions: Self-care/ADL training;Therapeutic exercise;Energy conservation;DME and/or AE instruction;Therapeutic activities;Balance training;Patient/family education    OT Goals(Current goals can be found in the care plan section) Acute Rehab OT Goals Patient Stated Goal: to go home OT Goal Formulation: With patient Time For Goal Achievement: 11/29/20 Potential to Achieve Goals: Good ADL Goals Pt Will Perform Grooming: with modified independence;standing Pt Will Perform Lower Body Dressing: with modified independence;sitting/lateral leans;sit to/from stand Pt Will Transfer to Toilet: with modified independence;ambulating;regular height toilet Pt Will Perform Tub/Shower Transfer: Tub transfer;with modified independence;ambulating;3 in 1  OT Frequency: Min 2X/week   Barriers to D/C:            Co-evaluation PT/OT/SLP Co-Evaluation/Treatment: Yes Reason for Co-Treatment: Complexity of the patient's impairments (multi-system involvement);For patient/therapist safety;Other (comment) (low endurance) PT goals addressed during session: Mobility/safety with mobility;Balance OT goals addressed during session: ADL's and self-care      AM-PAC OT "6 Clicks" Daily Activity     Outcome Measure Help from another person eating meals?: None Help from another person taking care of personal grooming?: A Little Help from another person toileting, which includes using toliet, bedpan, or urinal?: A Little Help from another person bathing (including washing, rinsing, drying)?: A Little Help from another person to put on and taking off regular upper body clothing?: A Little Help from another person to put on and taking off regular lower body clothing?: A Little 6 Click Score: 19   End of Session Nurse Communication: Mobility  status;Patient requests pain meds  Activity Tolerance:  Patient tolerated treatment well Patient left: in chair;with call bell/phone within reach;with chair alarm set;with family/visitor present  OT Visit Diagnosis: Unsteadiness on feet (R26.81);Other abnormalities of gait and mobility (R26.89);Muscle weakness (generalized) (M62.81);Pain Pain - part of body:  (abdomen)                Time: 5852-7782 OT Time Calculation (min): 28 min Charges:  OT General Charges $OT Visit: 1 Visit OT Evaluation $OT Eval Low Complexity: 1 Low  Lorre Munroe, OTR/L  Lorre Munroe 11/15/2020, 11:24 AM

## 2020-11-15 NOTE — Progress Notes (Signed)
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  Presents with progressively worsening abdominal pain.  Found to have acute on chronic liver disease rapidly progressing to liver failure. GI consulted. At the request of the GI palliative care as well as nephrology were also considered. SP paracentesis. Currently plan is continue current care under GI guidance.  Assessment and Plan: 1.  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 Initially it was significantly high.  Likely the improvement is secondary to improvement in the renal function. Appreciate GI consultation. Currently initiating further work-up. Alpha-1 antitrypsin normal, ceruloplasmin 23.2 normal, mitochondrial antibodies normal.  Asma week +21.  ANA currently pending. SP 2.5 L of paracentesis. On IV ceftriaxone. Follow-up on cultures.  So far negative.  Abdominal pain improving. Significant asterixis on 11/13/2020. Currently significant improvement in 11/14/2020. Continue lactulose at a reduced dose. Avoid hepatotoxic medication. IV albumin 25 g every 6 hours for now. AFP negative.  Cytology pending. GI considering EGD tomorrow As well as potential MRI liver for further work-up. Per my discussion with the GI patient may require transplant although the timing of the duration of the transplant depending on her severity of the illness which is still not determined yet.  2.  Acute kidney injury. Hypokalemia. Etiology not clear.  At risk for hepatorenal syndrome.  Further work-up initiated.  Nephrology consult.  Will monitor response.  3.  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 Currently holding. May require initiation of IV heparin. Continue to hold. INR already therapeutic.  4.  Type 2 diabetes mellitus On Metformin at home. Hemoglobin A1c 4.0. On sliding scale insulin.  5.  Goals of care. Appreciate palliative care assistance. Continue full code. Patient agreeable for liver transplant if offered.  6.  Acute on chronic anemia. No acute bleeding. B12 normal. Reticulocyte count is elevated. CBC dropped down to 6.8, after 1 PRBC transfusion came up to 7.4 only with repeat one 7.8 and a repeat one 7.0. Patient also reports more abdominal distention. CT abdomen performed which does not show any evidence of intra-abdominal bleeding so far. Transfuse 1 more PRBC on 11/14/2020. Folic acid significantly low.  Will replace. B12 normal.  7.  Hypokalemia. Potassium 2.7.  Currently being replaced.  Will monitor.  8.  Obesity. Placing the patient at high risk for poor outcomes. Patient has Elita Boone Body mass index is 32.2 kg/m.  Nutrition Problem: Increased nutrient needs Etiology: chronic illness (cirrhosis) Interventions: Interventions: Ensure Enlive (each supplement provides 350kcal and 20 grams of protein), MVI  9.  Alcohol use. At her baseline patient drinks 3 glasses of wine on a daily basis. She denies having to drink more than that on a daily basis. She resolves not to drink anymore going forward.  10.  Splenomegaly. Platelet clumping/thrombocytopenia. Continue to monitor as the patient remains at risk for bleeding even her anticoagulation history as well as thrombocytopenia and liver disease.  Monitor.  Diet: Low-sodium diet DVT Prophylaxis:       Advance goals of care discussion: Full code  Family Communication: family was present at bedside, at the time of interview.  Discussed with husband on the phone.  Disposition:  Status is: Inpatient  Remains inpatient appropriate because:IV treatments appropriate  due to intensity of  illness or inability to take PO   Dispo: The patient is from: Home              Anticipated d/c is to: Home              Anticipated d/c date is: 3 days              Patient currently is not medically stable to d/c.  Subjective: No acute complaint.  No nausea no vomiting but no fever no chills.  Physical Exam:  General: Appear in mild distress, no Rash; Oral Mucosa Clear, moist. no Abnormal Neck Mass Or lumps, Conjunctiva normal  Cardiovascular: S1 and S2 Present, no Murmur, Respiratory: good respiratory effort, Bilateral Air entry present and CTA, no Crackles, no wheezes Abdomen: Bowel Sound present, Soft and Distended, diffusely tender tenderness Extremities: no Pedal edema Neurology: alert and oriented to time, place, and person affect appropriate. no new focal deficit, mild asterixis Gait not checked due to patient safety concerns  Vitals:   11/15/20 0200 11/15/20 0500 11/15/20 0836 11/15/20 1601  BP: 104/72  108/78 106/73  Pulse: 85  90 82  Resp: 16  20 16   Temp: 97.9 F (36.6 C)  98.5 F (36.9 C) 98.7 F (37.1 C)  TempSrc: Oral  Oral Oral  SpO2: 90%  90% 92%  Weight:  77.3 kg    Height:        Intake/Output Summary (Last 24 hours) at 11/15/2020 1932 Last data filed at 11/15/2020 1700 Gross per 24 hour  Intake 1415.62 ml  Output --  Net 1415.62 ml   Filed Weights   11/13/20 0500 11/14/20 0600 11/15/20 0500  Weight: 74.3 kg 77 kg 77.3 kg    Data Reviewed: I have personally reviewed and interpreted daily labs, tele strips, imagings as discussed above. I reviewed all nursing notes, pharmacy notes, vitals, pertinent old records I have discussed plan of care as described above with RN and patient/family.  CBC: Recent Labs  Lab 11/13/20 0148 11/13/20 1545 11/13/20 1814 11/14/20 0633 11/15/20 0857  WBC 13.6* 12.6* 13.9* 12.0* 11.6*  NEUTROABS  --   --   --  9.2* 8.6*  HGB 6.8* 7.4* 7.8* 7.0* 8.3*  HCT 19.0* 20.1* 21.8* 19.8* 23.4*  MCV 136.7* 118.2*  121.1* 120.7* 115.8*  PLT PLATELET CLUMPS NOTED ON SMEAR, UNABLE TO ESTIMATE PLATELET CLUMPS NOTED ON SMEAR, UNABLE TO ESTIMATE 101* PLATELET CLUMPS NOTED ON SMEAR, UNABLE TO ESTIMATE PLATELET CLUMPS NOTED ON SMEAR, UNABLE TO ESTIMATE   Basic Metabolic Panel: Recent Labs  Lab 11/12/20 0243 11/13/20 0148 11/13/20 1545 11/14/20 0633 11/15/20 0857  NA 128* 130* 129* 133* 133*  K 3.7 2.9* 3.4* 2.7* 2.9*  CL 80* 82* 84* 88* 92*  CO2 33* 33* 32 33* 28  GLUCOSE 90 108* 113* 116* 100*  BUN 23* 29* 30* 29* 23*  CREATININE 1.39* 1.27* 1.22* 1.02* 0.91  CALCIUM 8.5* 9.0 9.1 9.1 9.4  MG 2.5*  --  2.4  --   --     Studies: No results found.  Scheduled Meds:  sodium chloride   Intravenous Once   feeding supplement  237 mL Oral TID BM   insulin aspart  0-9 Units Subcutaneous TID WC   lactulose  30 g Oral BID   multivitamin with minerals  1 tablet Oral Daily   pantoprazole (PROTONIX) IV  40 mg Intravenous Q12H   sodium chloride flush  3 mL Intravenous Q12H   Continuous  Infusions:  sodium chloride     albumin human 25 g (11/15/20 1326)   cefTRIAXone (ROCEPHIN)  IV 2 g (11/15/20 0124)   PRN Meds:   Time spent: 1 hour.  9 30-10 30 a.m.  Author: Lynden Oxford, MD [facility] 11/15/2020 7:32 PM  To reach On-call, see care teams to locate the attending and reach out via [email or web address]. Between 7PM-7AM, please contact night-coverage If you still have difficulty reaching the attending provider, please page the [facility] (Director on Call) for [facility] on amion for assistance.

## 2020-11-15 NOTE — Progress Notes (Signed)
[facility] Gastroenterology Progress Note  Shelby Conner 49 y.o. [birth year]  CC:  Decompensated cirrhosis  Subjective: Patient denies any abdominal pain.  Denies nausea or vomiting.  Denies rectal bleeding or melena.  Is tolerating a regular diet.  ROS : Review of Systems  Respiratory: Negative for shortness of breath.   Cardiovascular: Negative for chest pain and palpitations.  Gastrointestinal: Negative for abdominal pain, blood in stool, constipation, diarrhea, heartburn, melena, nausea and vomiting.   Objective: Vital signs in last 24 hours: Vitals:   11/15/20 0200 11/15/20 0836  BP: 104/72 108/78  Pulse: 85 90  Resp: 16 20  Temp: 97.9 F (36.6 C) 98.5 F (36.9 C)  SpO2: 90% 90%    Physical Exam:  General:  Jaundice, alert, cooperative, no distress, appears stated age  Head:  Normocephalic, without obvious abnormality, atraumatic  Eyes:  Deep icterus, EOMs intact  Lungs:   Clear to auscultation bilaterally, respirations unlabored  Heart:  Regular rate and rhythm, S1, S2 normal  Abdomen:   Soft, moderately distended, non-tender, bowel sounds active all four quadrants,  no masses,   Extremities: Extremities normal, atraumatic, no  edema  Neuro: Alert and oriented; asterixis no longer present    Lab Results: Recent Labs    11/13/20 1545 11/14/20 0633  NA 129* 133*  K 3.4* 2.7*  CL 84* 88*  CO2 32 33*  GLUCOSE 113* 116*  BUN 30* 29*  CREATININE 1.22* 1.02*  CALCIUM 9.1 9.1  MG 2.4  --    Recent Labs    11/13/20 0148 11/14/20 0633  AST 101* 93*  ALT 36 31  ALKPHOS 119 87  BILITOT 19.6* 18.5*  PROT 7.5 7.4  ALBUMIN 2.6* 3.3*   Recent Labs    11/13/20 1814 11/14/20 0633  WBC 13.9* 12.0*  NEUTROABS  --  9.2*  HGB 7.8* 7.0*  HCT 21.8* 19.8*  MCV 121.1* 120.7*  PLT 101* PLATELET CLUMPS NOTED ON SMEAR, UNABLE TO ESTIMATE   Recent Labs    11/13/20 0148 11/14/20 0633  LABPROT 22.8* 23.8*  INR 2.1* 2.2*      Assessment: Decompensated  cirrhosis, 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 Per Dr. Allena Katz, patient now states she drinks 3 glasses of wine per evening.  MELD score of 28 as of 11/14/20. -T bili 18.5/AST 93/ALT 31/ALP 87 as of 11/21 -INR 2.2 as of 11/21 -WBCs 14.6, decreased from 7.5 yesterday -Acute hepatitis panel negative 11/11/2020 -Weak positive ASMA (21), normal ANA and AMA  Anemia -Hgb decreased to 6.8 on 11/20, improved to 7.8 after 1u pRBCs.  Decreased to 7.0 yesterday, was transfused 1u pRBCs.  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  Repeat INR today.  EGD tomorrow.  I thoroughly discussed the procedure with the patient to include nature, alternatives, benefits, and risks (including but not limited to bleeding, infection, perforation, anesthesia/cardiac and pulmonary complications).  Patient verbalized understanding and gave verbal consent to proceed with EGD.  NPO after midnight.  Continue to hold Xarelto.  Continue supportive care.  Continue empiric antibiotics.  [facility] will follow.  Edrick Kins PA-C 11/15/2020, 10:05 AM  Contact #  [phone]

## 2020-11-16 ENCOUNTER — Encounter (HOSPITAL_COMMUNITY): Payer: Self-pay | Admitting: Internal Medicine

## 2020-11-16 ENCOUNTER — Inpatient Hospital Stay (HOSPITAL_COMMUNITY): Payer: BC Managed Care – PPO | Admitting: Certified Registered Nurse Anesthetist

## 2020-11-16 ENCOUNTER — Encounter (HOSPITAL_COMMUNITY)
Admission: EM | Disposition: A | Discharge status: Home Health | Payer: Self-pay | Source: Home / Self Care | Attending: Internal Medicine

## 2020-11-16 HISTORY — PX: ESOPHAGOGASTRODUODENOSCOPY: SHX5428

## 2020-11-16 HISTORY — PX: BIOPSY: SHX5522

## 2020-11-16 LAB — GLUCOSE, CAPILLARY
Glucose-Capillary: 104 mg/dL — ABNORMAL HIGH (ref 70–99)
Glucose-Capillary: 96 mg/dL (ref 70–99)
Glucose-Capillary: 88 mg/dL (ref 70–99)
Glucose-Capillary: 114 mg/dL — ABNORMAL HIGH (ref 70–99)
Glucose-Capillary: 103 mg/dL — ABNORMAL HIGH (ref 70–99)

## 2020-11-16 LAB — CBC WITH DIFFERENTIAL/PLATELET
Abs Immature Granulocytes: 0.24 10*3/uL — ABNORMAL HIGH (ref 0.00–0.07)
Basophils Absolute: 0.1 10*3/uL (ref 0.0–0.1)
Basophils Relative: 1 %
Eosinophils Absolute: 0 10*3/uL (ref 0.0–0.5)
Eosinophils Relative: 0 %
HCT: 23.3 % — ABNORMAL LOW (ref 36.0–46.0)
Hemoglobin: 8.1 g/dL — ABNORMAL LOW (ref 12.0–15.0)
Immature Granulocytes: 2 %
Lymphocytes Relative: 15 %
Lymphs Abs: 2 10*3/uL (ref 0.7–4.0)
MCH: 40.1 pg — ABNORMAL HIGH (ref 26.0–34.0)
MCHC: 34.8 g/dL (ref 30.0–36.0)
MCV: 115.3 fL — ABNORMAL HIGH (ref 80.0–100.0)
Monocytes Absolute: 1 10*3/uL (ref 0.1–1.0)
Monocytes Relative: 8 %
Neutro Abs: 9.7 10*3/uL — ABNORMAL HIGH (ref 1.7–7.7)
Neutrophils Relative %: 74 %
Platelets: UNDETERMINED 10*3/uL (ref 150–400)
RBC: 2.02 MIL/uL — ABNORMAL LOW (ref 3.87–5.11)
RDW: 27.7 % — ABNORMAL HIGH (ref 11.5–15.5)
WBC: 13 10*3/uL — ABNORMAL HIGH (ref 4.0–10.5)
nRBC: 0.6 % — ABNORMAL HIGH (ref 0.0–0.2)

## 2020-11-16 LAB — COMPREHENSIVE METABOLIC PANEL
ALT: 29 U/L (ref 0–44)
AST: 91 U/L — ABNORMAL HIGH (ref 15–41)
Albumin: 4.1 g/dL (ref 3.5–5.0)
Alkaline Phosphatase: 81 U/L (ref 38–126)
Anion gap: 12 (ref 5–15)
BUN: 21 mg/dL — ABNORMAL HIGH (ref 6–20)
CO2: 26 mmol/L (ref 22–32)
Calcium: 9.1 mg/dL (ref 8.9–10.3)
Chloride: 94 mmol/L — ABNORMAL LOW (ref 98–111)
Creatinine, Ser: 0.91 mg/dL (ref 0.44–1.00)
GFR, Estimated: >60 mL/min (ref >60)
Glucose, Bld: 108 mg/dL — ABNORMAL HIGH (ref 70–99)
Potassium: 3.4 mmol/L — ABNORMAL LOW (ref 3.5–5.1)
Sodium: 132 mmol/L — ABNORMAL LOW (ref 135–145)
Total Bilirubin: 17.2 mg/dL — ABNORMAL HIGH (ref 0.3–1.2)
Total Protein: 7.1 g/dL (ref 6.5–8.1)

## 2020-11-16 LAB — PROTIME-INR
INR: 2.1 — ABNORMAL HIGH (ref 0.8–1.2)
Prothrombin Time: 23.2 seconds — ABNORMAL HIGH (ref 11.4–15.2)

## 2020-11-16 SURGERY — EGD (ESOPHAGOGASTRODUODENOSCOPY)
Anesthesia: Monitor Anesthesia Care

## 2020-11-16 MED ORDER — PANTOPRAZOLE SODIUM 40 MG PO TBEC
40.0000 mg | DELAYED_RELEASE_TABLET | Freq: Two times a day (BID) | ORAL | Status: DC
  Administered 2020-11-16 – 2020-11-17 (×2): 40 mg via ORAL
  Filled 2020-11-16 (×2): qty 1

## 2020-11-16 MED ORDER — PHENYLEPHRINE 40 MCG/ML (10ML) SYRINGE FOR IV PUSH (FOR BLOOD PRESSURE SUPPORT)
PREFILLED_SYRINGE | INTRAVENOUS | Status: DC | PRN
  Administered 2020-11-16: 80 ug via INTRAVENOUS
  Administered 2020-11-16: 120 ug via INTRAVENOUS
  Administered 2020-11-16: 80 ug via INTRAVENOUS
  Administered 2020-11-16: 120 ug via INTRAVENOUS

## 2020-11-16 MED ORDER — CYCLOBENZAPRINE HCL 5 MG PO TABS
5.0000 mg | ORAL_TABLET | Freq: Three times a day (TID) | ORAL | Status: DC | PRN
  Administered 2020-11-16: 5 mg via ORAL
  Filled 2020-11-16: qty 1

## 2020-11-16 MED ORDER — LACTATED RINGERS IV SOLN: INTRAVENOUS | Status: DC | PRN

## 2020-11-16 MED ORDER — PROPOFOL 10 MG/ML IV BOLUS
INTRAVENOUS | Status: DC | PRN
  Administered 2020-11-16 (×4): 10 mg via INTRAVENOUS

## 2020-11-16 MED ORDER — CEFDINIR 300 MG PO CAPS
300.0000 mg | ORAL_CAPSULE | Freq: Two times a day (BID) | ORAL | Status: DC
  Administered 2020-11-16 – 2020-11-17 (×3): 300 mg via ORAL
  Filled 2020-11-16 (×4): qty 1

## 2020-11-16 MED ORDER — POTASSIUM CHLORIDE CRYS ER 20 MEQ PO TBCR
40.0000 meq | EXTENDED_RELEASE_TABLET | Freq: Once | ORAL | Status: AC
  Administered 2020-11-16: 40 meq via ORAL
  Filled 2020-11-16: qty 2

## 2020-11-16 MED ORDER — PROPOFOL 500 MG/50ML IV EMUL
INTRAVENOUS | Status: DC | PRN
  Administered 2020-11-16: 100 ug/kg/min via INTRAVENOUS

## 2020-11-16 NOTE — Plan of Care (Signed)
  Problem: Education: Goal: Knowledge of General Education information will improve Description: Including pain rating scale, medication(s)/side effects and non-pharmacologic comfort measures Outcome: Progressing   Problem: Activity: Goal: Risk for activity intolerance will decrease Outcome: Progressing   

## 2020-11-16 NOTE — Progress Notes (Signed)
PT Cancellation Note  Patient Details Name: Alexiah Koroma MRN: [id number] DOB: 08-24-1971   Cancelled Treatment:    Reason Eval/Treat Not Completed: Patient at procedure or test/unavailable Patient off unit for procedure. PT will re-attempt session as time allows  Gregor Hams, PT, DPT Acute Rehabilitation Services Pager [phone] Office [phone]   Elissa Lovett 11/16/2020, 9:31 AM

## 2020-11-16 NOTE — Progress Notes (Signed)
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  Presents with progressively worsening abdominal pain.  Found to have acute on chronic liver disease rapidly progressing to liver failure. GI consulted. At the request of the GI palliative care as well as nephrology were also considered.  Underwent paracentesis as well as upper GI endoscopy. Currently plan is monitor for medical stability with plan to discharge home with follow-up with GI outpatient.  Assessment and Plan: 1.  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 Initially it was significantly high.  Likely the improvement is secondary to improvement in the renal function. Appreciate GI consultation. Currently initiating further work-up. Alpha-1 antitrypsin normal, ceruloplasmin 23.2 normal, mitochondrial antibodies normal.  Asma week +21.  ANA currently pending. SP 2.5 L of paracentesis. On IV ceftriaxone.  Change to oral Omnicef. Follow-up on cultures.  So far negative.  Abdominal pain improving. Asterixis improving. Continue lactulose at a reduced dose. Avoid hepatotoxic medication. AFP negative.  Cytology negative for malignancy. Underwent EGD which showed esophagitis.  Currently on PPI twice daily. Reviewed paracentesis ordered tomorrow on 11/17/2020.  Will need IV albumin after that. Max volume 5 L. May be able to go home after that or day after. Per my discussion with the GI patient may require transplant although given alcohol use history currently will not be a good candidate.  2.  Acute kidney injury. Hypokalemia. Etiology not clear.  At risk for hepatorenal syndrome.  Appreciate  Nephrology consult.   Will monitor response.  3.  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 Currently holding. Resume tomorrow after paracentesis. INR already therapeutic.  4.  Type 2 diabetes mellitus controlled.  No complication. On Metformin at home. Hemoglobin A1c 4.0. On sliding scale insulin.  5.  Goals of care. Appreciate palliative care assistance. Continue full code. Patient agreeable for liver transplant if offered.  6.  Acute on chronic anemia. No acute bleeding. B12 normal. Reticulocyte count is elevated. CBC dropped down to 6.8, after 1 PRBC transfusion came up to 7.4 only with repeat one 7.8 and a repeat one 7.0. Patient also reports more abdominal distention. CT abdomen performed which does not show any evidence of intra-abdominal bleeding so far. Transfuse 1 more PRBC on 11/14/2020. Folic acid significantly low.  Will replace. B12 normal.  7.  Hypokalemia. Currently being replaced. Improving. Will monitor.  8.  Obesity. Placing the patient at high risk for poor outcomes. Patient has Elita Boone Body mass index is 32.2 kg/m.  Nutrition Problem: Increased nutrient needs Etiology: chronic illness (cirrhosis) Interventions: Interventions: Ensure Enlive (each supplement provides 350kcal and 20 grams of protein), MVI  9.  Alcohol use. At her baseline patient drinks 3 glasses of wine on a daily basis. She denies having to drink more than that on a daily basis. She resolves not to drink anymore going forward. Per GI will not be able to get any additional transplant given her alcohol use history.  She needs to remain sober for at least 6 months. They will follow in the clinic.  10.  Splenomegaly. Platelet clumping/thrombocytopenia. Continue to monitor as the patient remains at risk for bleeding even her anticoagulation history as well as thrombocytopenia and liver disease.  Monitor.  Body mass  index is 32.49 kg/m.  Nutrition Problem: Increased  nutrient needs Etiology: chronic illness (cirrhosis) Interventions: Interventions: Ensure Enlive (each supplement provides 350kcal and 20 grams of protein), MVI    Diet: Low-sodium diet DVT Prophylaxis: Resume anticoagulation with Xarelto tomorrow after paracentesis  Advance goals of care discussion: Full code  Family Communication: family was present at bedside, at the time of interview.  The pt provided permission to discuss medical plan with the family. Opportunity was given to ask question and all questions were answered satisfactorily.   Disposition:  Status is: Inpatient  Remains inpatient appropriate because:Hemodynamically unstable and Ongoing diagnostic testing needed not appropriate for outpatient work up  Dispo: The patient is from: Home              Anticipated d/c is to: Home              Anticipated d/c date is: 1 day              Patient currently is not medically stable to d/c.  Subjective: Reports severe abdominal pain.  No nausea no vomiting.  No fever no chills.  Diarrhea improving.  No blood in the stool.  Physical Exam:  General: Appear in mild distress, no Rash; Oral Mucosa Clear, moist. no Abnormal Neck Mass Or lumps, Conjunctiva normal  Cardiovascular: S1 and S2 Present, no Murmur, Respiratory: good respiratory effort, Bilateral Air entry present and CTA, no Crackles, no wheezes Abdomen: Bowel Sound present, Soft and distended, mild diffuse tenderness Extremities: no Pedal edema Neurology: alert and oriented to time, place, and person affect appropriate. no new focal deficit Gait not checked due to patient safety concerns  Vitals:   11/16/20 1040 11/16/20 1050 11/16/20 1106 11/16/20 1650  BP: 104/61 106/61  100/62  Pulse: 80 79  86  Resp: (!) 21 17  16   Temp:    98.5 F (36.9 C)  TempSrc:    Oral  SpO2: 94% 91%  93%  Weight:   78 kg   Height:        Intake/Output Summary (Last 24 hours) at 11/16/2020 1933 Last data filed at 11/16/2020  1643 Gross per 24 hour  Intake 1089.45 ml  Output 400 ml  Net 689.45 ml   Filed Weights   11/14/20 0600 11/15/20 0500 11/16/20 1106  Weight: 77 kg 77.3 kg 78 kg    Data Reviewed: I have personally reviewed and interpreted daily labs, tele strips, imagings as discussed above. I reviewed all nursing notes, pharmacy notes, vitals, pertinent old records I have discussed plan of care as described above with RN and patient/family.  CBC: Recent Labs  Lab 11/13/20 1545 11/13/20 1814 11/14/20 0633 11/15/20 0857 11/16/20 0048  WBC 12.6* 13.9* 12.0* 11.6* 13.0*  NEUTROABS  --   --  9.2* 8.6* 9.7*  HGB 7.4* 7.8* 7.0* 8.3* 8.1*  HCT 20.1* 21.8* 19.8* 23.4* 23.3*  MCV 118.2* 121.1* 120.7* 115.8* 115.3*  PLT PLATELET CLUMPS NOTED ON SMEAR, UNABLE TO ESTIMATE 101* PLATELET CLUMPS NOTED ON SMEAR, UNABLE TO ESTIMATE PLATELET CLUMPS NOTED ON SMEAR, UNABLE TO ESTIMATE PLATELET CLUMPS NOTED ON SMEAR, UNABLE TO ESTIMATE   Basic Metabolic Panel: Recent Labs  Lab 11/12/20 0243 11/12/20 0243 11/13/20 0148 11/13/20 1545 11/14/20 0633 11/15/20 0857 11/16/20 0048  NA 128*   < > 130* 129* 133* 133* 132*  K 3.7   < > 2.9* 3.4* 2.7* 2.9* 3.4*  CL 80*   < > 82* 84* 88* 92* 94*  CO2 33*   < >  33* 32 33* 28 26  GLUCOSE 90   < > 108* 113* 116* 100* 108*  BUN 23*   < > 29* 30* 29* 23* 21*  CREATININE 1.39*   < > 1.27* 1.22* 1.02* 0.91 0.91  CALCIUM 8.5*   < > 9.0 9.1 9.1 9.4 9.1  MG 2.5*  --   --  2.4  --   --   --    < > = values in this interval not displayed.    Studies: No results found.  Scheduled Meds: . sodium chloride   Intravenous Once  . cefdinir  300 mg Oral Q12H  . feeding supplement  237 mL Oral TID BM  . insulin aspart  0-9 Units Subcutaneous TID WC  . lactulose  30 g Oral BID  . multivitamin with minerals  1 tablet Oral Daily  . pantoprazole  40 mg Oral BID AC  . sodium chloride flush  3 mL Intravenous Q12H   Continuous Infusions: PRN Meds: cyclobenzaprine, oxyCODONE,  traMADol  Time spent: 35 minutes  Author: Lynden Oxford, MD [facility] 11/16/2020 7:33 PM  To reach On-call, see care teams to locate the attending and reach out via [email or web address]. Between 7PM-7AM, please contact night-coverage If you still have difficulty reaching the attending provider, please page the [facility] (Director on Call) for [facility] on amion for assistance.

## 2020-11-16 NOTE — Anesthesia Preprocedure Evaluation (Addendum)
Anesthesia Evaluation  Patient identified by MRN, date of birth, ID band Patient awake    Reviewed: Allergy & Precautions, H&P , NPO status , Patient's Chart, lab work & pertinent test results  Airway Mallampati: III  TM Distance: >3 FB Neck ROM: Full    Dental no notable dental hx. (+) Teeth Intact, Dental Advisory Given   Pulmonary neg pulmonary ROS, former smoker,    Pulmonary exam normal breath sounds clear to auscultation       Cardiovascular negative cardio ROS Normal cardiovascular exam Rhythm:Regular Rate:Normal     Neuro/Psych negative neurological ROS  negative psych ROS   GI/Hepatic negative GI ROS, Neg liver ROS,   Endo/Other  diabetes, Type 2, Oral Hypoglycemic AgentsObesity   Renal/GU Renal disease  negative genitourinary   Musculoskeletal negative musculoskeletal ROS (+)   Abdominal   Peds  Hematology  (+) Blood dyscrasia (Xarelto), anemia , Protein S Deficiency Protein C Deficiency   Anesthesia Other Findings Day of surgery medications reviewed with the patient.  Reproductive/Obstetrics negative OB ROS                           Anesthesia Physical Anesthesia Plan  ASA: II  Anesthesia Plan: MAC   Post-op Pain Management:    Induction: Intravenous  PONV Risk Score and Plan: 2 and Propofol infusion and Treatment may vary due to age or medical condition  Airway Management Planned: Nasal Cannula and Natural Airway  Additional Equipment:   Intra-op Plan:   Post-operative Plan:   Informed Consent: I have reviewed the patients History and Physical, chart, labs and discussed the procedure including the risks, benefits and alternatives for the proposed anesthesia with the patient or authorized representative who has indicated his/her understanding and acceptance.     Dental advisory given  Plan Discussed with: CRNA and Anesthesiologist  Anesthesia Plan Comments:         Anesthesia Quick Evaluation

## 2020-11-16 NOTE — Interval H&P Note (Signed)
History and Physical Interval Note:  11/16/2020 10:05 AM  Shelby Conner  has presented today for surgery, with the diagnosis of anemia, varices on CT.  The various methods of treatment have been discussed with the patient and family. After consideration of risks, benefits and other options for treatment, the patient has consented to  Procedure(s): ESOPHAGOGASTRODUODENOSCOPY (EGD) (N/A) as a surgical intervention.  The patient's history has been reviewed, patient examined, no change in status, stable for surgery.  I have reviewed the patient's chart and labs.  Questions were answered to the patient's satisfaction.     Donzella Carrol

## 2020-11-16 NOTE — Brief Op Note (Signed)
11/11/2020 - 11/16/2020  10:30 AM  PATIENT:  Shelby Conner  49 y.o. female  PRE-OPERATIVE DIAGNOSIS:  anemia, varices on CT  POST-OPERATIVE DIAGNOSIS:  Esophagitis,portal gastropathy,   PROCEDURE:  Procedure(s): ESOPHAGOGASTRODUODENOSCOPY (EGD) (N/A) BIOPSY  SURGEON:  Surgeon(s) and Role:    * Emorie Mcfate, MD - Primary  Findings ----------- -EGD showed mid and distal LA grade C esophagitis and gastritis.  Large amount of retained food in the stomach limiting visualization.  No evidence of any bleeding.  Recommendations ------------------------- -Advance diet -Monitor H&H -Pantoprazole 40 mg twice daily -Continue other supportive care -GI will follow  Kathi Der MD, FACP 11/16/2020, 10:31 AM  Contact #  [phone]

## 2020-11-16 NOTE — Interval H&P Note (Signed)
History and Physical Interval Note:  11/16/2020 10:07 AM  Shelby Conner  has presented today for surgery, with the diagnosis of anemia, varices on CT.  The various methods of treatment have been discussed with the patient and family. After consideration of risks, benefits and other options for treatment, the patient has consented to  Procedure(s): ESOPHAGOGASTRODUODENOSCOPY (EGD) (N/A) as a surgical intervention.  The patient's history has been reviewed, patient examined, no change in status, stable for surgery.  I have reviewed the patient's chart and labs.  Questions were answered to the patient's satisfaction.     Marleny Faller

## 2020-11-16 NOTE — Op Note (Signed)
[facility] Patient Name: Shelby Conner Procedure Date : 11/16/2020 MRN: [id number] Attending MD: Kathi Der , MD Date of Birth: 07-Oct-1971 CSN: [id number] Age: 49 Admit Type: Inpatient Procedure:                Upper GI endoscopy Indications:              Iron deficiency anemia, Cirrhosis rule out                            esophageal varices Providers:                Kathi Der, MD, Fayrene Fearing, RN, Harrington Challenger, Technician, Rosilyn Mings, Technician,                            Rise Patience, CRNA Referring MD:              Medicines:                Sedation Administered by an Anesthesia Professional Complications:            No immediate complications. Estimated Blood Loss:     Estimated blood loss was minimal. Procedure:                Pre-Anesthesia Assessment:                           - Prior to the procedure, a History and Physical                            was performed, and patient medications and                            allergies were reviewed. The patient's tolerance of                            previous anesthesia was also reviewed. The risks                            and benefits of the procedure and the sedation                            options and risks were discussed with the patient.                            All questions were answered, and informed consent                            was obtained. Prior Anticoagulants: The patient has                            taken no previous anticoagulant or antiplatelet  agents. ASA Grade Assessment: II - A patient with                            mild systemic disease. After reviewing the risks                            and benefits, the patient was deemed in                            satisfactory condition to undergo the procedure.                           After obtaining informed consent, the endoscope was                             passed under direct vision. Throughout the                            procedure, the patient's blood pressure, pulse, and                            oxygen saturations were monitored continuously. The                            GIF-H190 ([id number]) Olympus gastroscope was                            introduced through the mouth, and advanced to the                            second part of duodenum. The upper GI endoscopy was                            performed with moderate difficulty due to presence                            of food. The patient tolerated the procedure well. Scope In: Scope Out: Findings:      LA Grade C (one or more mucosal breaks continuous between tops of 2 or       more mucosal folds, less than 75% circumference) esophagitis with no       bleeding was found in the mid and distal esophagus.      There is no endoscopic evidence of varices in the entire esophagus.      A large amount of food (residue) was found in the gastric fundus and in       the gastric body.      Scattered mild inflammation characterized by congestion (edema) and       erythema was found in the gastric antrum. Biopsies were taken with a       cold forceps for histology.      The duodenal bulb, first portion of the duodenum and second portion of       the duodenum were normal. Impression:               - LA  Grade C esophagitis with no bleeding.                           - A large amount of food (residue) in the stomach.                           - Gastritis. Biopsied.                           - Normal duodenal bulb, first portion of the                            duodenum and second portion of the duodenum. Recommendation:           - Return patient to hospital ward for ongoing care.                           - Resume previous diet.                           - Continue present medications.                           - Await pathology results. Procedure Code(s):        --- Professional  ---                           [phone], Esophagogastroduodenoscopy, flexible,                            transoral; with biopsy, single or multiple Diagnosis Code(s):        --- Professional ---                           K20.90, Esophagitis, unspecified without bleeding                           K29.70, Gastritis, unspecified, without bleeding                           D50.9, Iron deficiency anemia, unspecified                           K74.60, Unspecified cirrhosis of liver CPT copyright 2019 [employer]. All rights reserved. The codes documented in this report are preliminary and upon coder review may  be revised to meet current compliance requirements. Kathi Der, MD Kathi Der, MD 11/16/2020 10:30:38 AM Number of Addenda: 0

## 2020-11-16 NOTE — Progress Notes (Signed)
Nutrition Follow-up  DOCUMENTATION CODES:   Obesity unspecified  INTERVENTION:   -Once diet is advanced, continue:  -Ensure Enlive po TID, each supplement provides 350 kcal and 20 grams of protein -MVI with minerals daily  NUTRITION DIAGNOSIS:   Increased nutrient needs related to chronic illness (cirrhosis) as evidenced by estimated needs.  Ongoing  GOAL:   Patient will meet greater than or equal to 90% of their needs  Progressing  MONITOR:   PO intake, Supplement acceptance, Labs, Weight trends, Skin, I & O's  REASON FOR ASSESSMENT:   Malnutrition Screening Tool    ASSESSMENT:   Shelby Conner is a 49 y.o. female with medical history significant of fatty liver, diabetes, protein C and S deficiency presents with progressive abdominal pain for the past 1 to 2 weeks.  11/19- s/p Successful US guided paracentesis fromright lateral abdomen (2.5 L dark yellow fluid removed)  Reviewed I/O's: +1.1 L x 24 hours and +5 L since admission  UOP: 400 ml x 24 hours  Attempted to speak with pt via call to hospital room phone, however, unable to reach.   Per Gi note, plan for EGD today with possible bad ligation secondary to drop in hemoglobin. Pt currently NPO for procedure. Prior to this, pt was on a 2 gram sodium diet, however, only consuming 0-25% of meals. She has been accepting the past several doses of Ensure Enlive.   Pt with poor oral intake and would benefit from nutrient dense supplement. One Ensure Enlive supplement provides 350 kcals, 20 grams protein, and 44-45 grams of carbohydrate vs one Glucerna shake supplement, which provides 220 kcals, 10 grams of protein, and 26 grams of carbohydrate. Given pt's hx of DM, RD will reassess adequacy of PO intake, CBGS, and adjust supplement regimen as appropriate at follow-up.   Palliative care following for goals of care discussions and evaluation for possible transplant candidacy.   Medications reviewed and include  lactulose.   Labs reviewed: CBGS: 132, K: 3.4, CBGS: 96-104 (inpatient orders for glycemic control are 0-9 units insulin aspart TID with meals).   Diet Order:   Diet Order            Diet NPO time specified  Diet effective midnight                 EDUCATION NEEDS:   Not appropriate for education at this time  Skin:  Skin Assessment: Reviewed RN Assessment  Last BM:  11/15/20  Height:   Ht Readings from Last 1 Encounters:  11/12/20 5\' 1"  (1.549 m)    Weight:   Wt Readings from Last 1 Encounters:  11/15/20 77.3 kg    Ideal Body Weight:  47.7 kg  BMI:  Body mass index is 32.2 kg/m.  Estimated Nutritional Needs:   Kcal:  1900-2100  Protein:  105-120 grams  Fluid:  > 1.9 L    11/17/20, RD, LDN, CDCES Registered Dietitian II Certified Diabetes Care and Education Specialist Please refer to [facility] for RD and/or RD on-call/weekend/after hours pager

## 2020-11-16 NOTE — Transfer of Care (Signed)
Immediate Anesthesia Transfer of Care Note  Patient: Shelby Conner  Procedure(s) Performed: ESOPHAGOGASTRODUODENOSCOPY (EGD) (N/A ) BIOPSY  Patient Location: Endoscopy Unit  Anesthesia Type:MAC  Level of Consciousness: awake and alert   Airway & Oxygen Therapy: Patient Spontanous Breathing  Post-op Assessment: Report given to RN and Post -op Vital signs reviewed and stable  Post vital signs: Reviewed and stable  Last Vitals:  Vitals Value Taken Time  BP    Temp    Pulse    Resp    SpO2      Last Pain:  Vitals:   11/16/20 0932  TempSrc: Temporal  PainSc: 8       Patients Stated Pain Goal: 2 (32/54/98 2641)  Complications: No complications documented.

## 2020-11-16 NOTE — Progress Notes (Signed)
Physical Therapy Treatment Patient Details Name: Shelby Conner MRN: [id number] DOB: 06-11-1971 Today's Date: 11/16/2020    History of Present Illness Patient is a 49 y/o female with PMH of fatty liver, diabetes, protein C and S deficiency presenting with progressing abdominal pain for the past 1 to 2 weeks. Patient is also noted with progressive jaundice and abdominal distention for the past week. Patient has decompensated hepatic cirrhosis. Patient underwent paracentesis from R lateral abdomen on 11/22.    PT Comments    Patient with increased abdominal pain this session which limited progress in session. Patient required min guard for ambulating 20' with IV pole, demos unsteadiness and requested IV pole for support, cues for problem solving maneuvering IV pole. Patient required supervision for sit>supine. Instructed patient to utilize the RW for ambulation when OOB to go to bathroom due to unsteadiness and pain, patient verbalized understanding and agreement. Continue to recommend HHPT following discharge to maximize functional mobility and safety in the home.     Follow Up Recommendations  Home health PT;Supervision - Intermittent     Equipment Recommendations  Rolling [name] with 5" wheels;3in1 (PT)    Recommendations for Other Services       Precautions / Restrictions Precautions Precautions: Fall Restrictions Weight Bearing Restrictions: No    Mobility  Bed Mobility Overal bed mobility: Needs Assistance Bed Mobility: Sit to Supine       Sit to supine: Supervision;HOB elevated   General bed mobility comments: sitting EOB upon arrival  Transfers Overall transfer level: Needs assistance Equipment used: None Transfers: Sit to/from Stand Sit to Stand: Supervision         General transfer comment: supervision for safety  Ambulation/Gait Ambulation/Gait assistance: Min guard Gait Distance (Feet): 20 Feet Assistive device: IV Pole Gait  Pattern/deviations: Step-to pattern;Wide base of support;Decreased stride length Gait velocity: decreased   General Gait Details: patient demos increased unsteadiness with gait this session. Patient required use of IV pole for steadying self and min guard for safety. Instructed patient to use RW for ambulation if she needs to get OOB for bathroom, patient verbalized understanding and agreement.   Stairs             Wheelchair Mobility    Modified Rankin (Stroke Patients Only)       Balance Overall balance assessment: Needs assistance Sitting-balance support: No upper extremity supported;Feet supported Sitting balance-Leahy Scale: Fair     Standing balance support: Single extremity supported;During functional activity Standing balance-Leahy Scale: Poor                              Cognition Arousal/Alertness: Awake/alert Behavior During Therapy: WFL for tasks assessed/performed Overall Cognitive Status: Impaired/Different from baseline Area of Impairment: Problem solving                             Problem Solving: Difficulty sequencing;Requires verbal cues General Comments: cues for problem solving tasks (i.e. IV pole into bathroom)      Exercises      General Comments General comments (skin integrity, edema, etc.): father present throughout      Pertinent Vitals/Pain Pain Assessment: Faces Faces Pain Scale: Hurts even more Pain Location: abdomen when bending or leaning forward Pain Descriptors / Indicators: Discomfort;Grimacing Pain Intervention(s): Limited activity within patient's tolerance;Monitored during session    Home Living  Prior Function            PT Goals (current goals can now be found in the care plan section) Acute Rehab PT Goals Patient Stated Goal: to go home PT Goal Formulation: With patient Time For Goal Achievement: 11/29/20 Potential to Achieve Goals: Good Progress towards PT  goals: Progressing toward goals    Frequency    Min 3X/week      PT Plan Current plan remains appropriate    Co-evaluation              AM-PAC PT "6 Clicks" Mobility   Outcome Measure  Help needed turning from your back to your side while in a flat bed without using bedrails?: None Help needed moving from lying on your back to sitting on the side of a flat bed without using bedrails?: None Help needed moving to and from a bed to a chair (including a wheelchair)?: A Little Help needed standing up from a chair using your arms (e.g., wheelchair or bedside chair)?: None Help needed to walk in hospital room?: A Little Help needed climbing 3-5 steps with a railing? : A Lot 6 Click Score: 20    End of Session Equipment Utilized During Treatment: Gait belt Activity Tolerance: Patient limited by fatigue;Patient limited by pain Patient left: in bed;with call bell/phone within reach;with nursing/sitter in room Nurse Communication: Mobility status PT Visit Diagnosis: Unsteadiness on feet (R26.81);Other abnormalities of gait and mobility (R26.89);Muscle weakness (generalized) (M62.81)     Time: 2025-4270 PT Time Calculation (min) (ACUTE ONLY): 15 min  Charges:  $Therapeutic Activity: 8-22 mins                     Gregor Hams, PT, DPT Acute Rehabilitation Services Pager [phone] Office [phone]    [name] 11/16/2020, 1:46 PM

## 2020-11-16 NOTE — Anesthesia Procedure Notes (Signed)
Procedure Name: MAC Date/Time: 11/16/2020 10:15 AM Performed by: Harden Mo, CRNA Pre-anesthesia Checklist: Patient identified, Emergency Drugs available, Suction available and Patient being monitored Patient Re-evaluated:Patient Re-evaluated prior to induction Oxygen Delivery Method: Nasal cannula Preoxygenation: Pre-oxygenation with 100% oxygen Induction Type: IV induction Placement Confirmation: positive ETCO2 and breath sounds checked- equal and bilateral Dental Injury: Teeth and Oropharynx as per pre-operative assessment

## 2020-11-16 NOTE — Anesthesia Postprocedure Evaluation (Signed)
Anesthesia Post Note  Patient: Shelby Conner  Procedure(s) Performed: ESOPHAGOGASTRODUODENOSCOPY (EGD) (N/A ) BIOPSY     Patient location during evaluation: Endoscopy Anesthesia Type: MAC Level of consciousness: awake and alert Pain management: pain level controlled Vital Signs Assessment: post-procedure vital signs reviewed and stable Respiratory status: spontaneous breathing, nonlabored ventilation and respiratory function stable Cardiovascular status: stable and blood pressure returned to baseline Postop Assessment: no apparent nausea or vomiting Anesthetic complications: no   No complications documented.  Last Vitals:  Vitals:   11/16/20 1040 11/16/20 1050  BP: 104/61 106/61  Pulse: 80 79  Resp: (!) 21 17  Temp:    SpO2: 94% 91%    Last Pain:  Vitals:   11/16/20 1050  TempSrc:   PainSc: 0-No pain                 Medardo Hassing,W. EDMOND

## 2020-11-17 ENCOUNTER — Other Ambulatory Visit: Payer: Self-pay | Admitting: Physician Assistant

## 2020-11-17 ENCOUNTER — Inpatient Hospital Stay (HOSPITAL_COMMUNITY): Payer: BC Managed Care – PPO

## 2020-11-17 ENCOUNTER — Encounter (HOSPITAL_COMMUNITY): Payer: Self-pay | Admitting: Gastroenterology

## 2020-11-17 LAB — COMPREHENSIVE METABOLIC PANEL WITH GFR
ALT: 32 U/L (ref 0–44)
AST: 104 U/L — ABNORMAL HIGH (ref 15–41)
Albumin: 4 g/dL (ref 3.5–5.0)
Alkaline Phosphatase: 99 U/L (ref 38–126)
Anion gap: 12 (ref 5–15)
BUN: 17 mg/dL (ref 6–20)
CO2: 24 mmol/L (ref 22–32)
Calcium: 9.1 mg/dL (ref 8.9–10.3)
Chloride: 96 mmol/L — ABNORMAL LOW (ref 98–111)
Creatinine, Ser: 0.9 mg/dL (ref 0.44–1.00)
GFR, Estimated: >60 mL/min
Glucose, Bld: 100 mg/dL — ABNORMAL HIGH (ref 70–99)
Potassium: 3.2 mmol/L — ABNORMAL LOW (ref 3.5–5.1)
Sodium: 132 mmol/L — ABNORMAL LOW (ref 135–145)
Total Bilirubin: 17.4 mg/dL — ABNORMAL HIGH (ref 0.3–1.2)
Total Protein: 7.5 g/dL (ref 6.5–8.1)

## 2020-11-17 LAB — CBC WITH DIFFERENTIAL/PLATELET
Abs Immature Granulocytes: 0.28 10*3/uL — ABNORMAL HIGH (ref 0.00–0.07)
Basophils Absolute: 0.1 10*3/uL (ref 0.0–0.1)
Basophils Relative: 1 %
Eosinophils Absolute: 0 10*3/uL (ref 0.0–0.5)
Eosinophils Relative: 0 %
HCT: 25.4 % — ABNORMAL LOW (ref 36.0–46.0)
Hemoglobin: 8.7 g/dL — ABNORMAL LOW (ref 12.0–15.0)
Immature Granulocytes: 2 %
Lymphocytes Relative: 13 %
Lymphs Abs: 1.6 10*3/uL (ref 0.7–4.0)
MCH: 40.3 pg — ABNORMAL HIGH (ref 26.0–34.0)
MCHC: 34.3 g/dL (ref 30.0–36.0)
MCV: 117.6 fL — ABNORMAL HIGH (ref 80.0–100.0)
Monocytes Absolute: 1 10*3/uL (ref 0.1–1.0)
Monocytes Relative: 8 %
Neutro Abs: 9.7 10*3/uL — ABNORMAL HIGH (ref 1.7–7.7)
Neutrophils Relative %: 76 %
Platelets: UNDETERMINED 10*3/uL (ref 150–400)
RBC: 2.16 MIL/uL — ABNORMAL LOW (ref 3.87–5.11)
RDW: 27.2 % — ABNORMAL HIGH (ref 11.5–15.5)
WBC: 12.7 10*3/uL — ABNORMAL HIGH (ref 4.0–10.5)
nRBC: 0.6 % — ABNORMAL HIGH (ref 0.0–0.2)

## 2020-11-17 LAB — SURGICAL PATHOLOGY

## 2020-11-17 LAB — MAGNESIUM: Magnesium: 1.9 mg/dL (ref 1.7–2.4)

## 2020-11-17 LAB — CULTURE, BODY FLUID W GRAM STAIN -BOTTLE: Culture: NO GROWTH

## 2020-11-17 LAB — PROTIME-INR
INR: 2.2 — ABNORMAL HIGH (ref 0.8–1.2)
Prothrombin Time: 23.9 seconds — ABNORMAL HIGH (ref 11.4–15.2)

## 2020-11-17 LAB — GLUCOSE, CAPILLARY
Glucose-Capillary: 85 mg/dL (ref 70–99)
Glucose-Capillary: 97 mg/dL (ref 70–99)

## 2020-11-17 LAB — ANA W/REFLEX IF POSITIVE

## 2020-11-17 MED ORDER — FUROSEMIDE 20 MG PO TABS: 20.0000 mg | ORAL_TABLET | Freq: Every day | ORAL | 0 refills | Status: DC

## 2020-11-17 MED ORDER — ADULT MULTIVITAMIN W/MINERALS CH
1.0000 | ORAL_TABLET | Freq: Every day | ORAL | 0 refills | Status: DC
Start: 2020-11-18 — End: 2023-02-08

## 2020-11-17 MED ORDER — SPIRONOLACTONE 25 MG PO TABS
25.0000 mg | ORAL_TABLET | Freq: Every day | ORAL | Status: DC
  Administered 2020-11-17: 25 mg via ORAL
  Filled 2020-11-17: qty 1

## 2020-11-17 MED ORDER — LACTULOSE 10 GM/15ML PO SOLN
30.0000 g | Freq: Two times a day (BID) | ORAL | 0 refills | Status: DC
Start: 2020-11-17 — End: 2020-12-11

## 2020-11-17 MED ORDER — ENSURE ENLIVE PO LIQD
237.0000 mL | Freq: Three times a day (TID) | ORAL | 12 refills | Status: DC
Start: 2020-11-17 — End: 2023-02-08

## 2020-11-17 MED ORDER — FUROSEMIDE 20 MG PO TABS
20.0000 mg | ORAL_TABLET | Freq: Every day | ORAL | Status: DC
  Administered 2020-11-17: 20 mg via ORAL
  Filled 2020-11-17: qty 1

## 2020-11-17 MED ORDER — OXYCODONE HCL 5 MG PO CAPS: 5.0000 mg | ORAL_CAPSULE | ORAL | 0 refills | Status: DC | PRN

## 2020-11-17 MED ORDER — PANTOPRAZOLE SODIUM 40 MG PO TBEC
40.0000 mg | DELAYED_RELEASE_TABLET | Freq: Two times a day (BID) | ORAL | 0 refills | Status: DC
Start: 2020-11-17 — End: 2023-02-08

## 2020-11-17 MED ORDER — FOLIC ACID 1 MG PO TABS: 1.0000 mg | ORAL_TABLET | Freq: Every day | ORAL | 3 refills | Status: AC

## 2020-11-17 MED ORDER — POTASSIUM CHLORIDE 20 MEQ PO PACK
40.0000 meq | PACK | Freq: Two times a day (BID) | ORAL | Status: DC
  Administered 2020-11-17: 40 meq via ORAL
  Filled 2020-11-17: qty 2

## 2020-11-17 MED ORDER — LIDOCAINE HCL 1 % IJ SOLN
INTRAMUSCULAR | Status: AC
  Filled 2020-11-17: qty 20

## 2020-11-17 MED ORDER — SPIRONOLACTONE 25 MG PO TABS: 25.0000 mg | ORAL_TABLET | Freq: Every day | ORAL | 0 refills | Status: DC

## 2020-11-17 NOTE — Progress Notes (Signed)
[facility] Gastroenterology Progress Note  Shelby Conner 49 y.o. [birth year]  CC:  Decompensated cirrhosis  Subjective: Patient denies any abdominal pain.  Denies nausea or vomiting.  Denies rectal bleeding or melena.  Is tolerating a regular diet.  ROS : Review of Systems  Respiratory: Negative for shortness of breath.   Cardiovascular: Negative for chest pain and palpitations.  Gastrointestinal: Negative for abdominal pain, blood in stool, constipation, diarrhea, heartburn, melena, nausea and vomiting.   Objective: Vital signs in last 24 hours: Vitals:   11/17/20 0435 11/17/20 0826  BP: 116/73 105/69  Pulse: 84 88  Resp: 18 16  Temp: 98 F (36.7 C) 98.7 F (37.1 C)  SpO2: 93% 96%    Physical Exam:  General:  Jaundice, alert, cooperative, no distress, appears stated age  Head:  Normocephalic, without obvious abnormality, atraumatic  Eyes:  Deep icterus, EOMs intact  Lungs:   Clear to auscultation bilaterally, respirations unlabored  Heart:  Regular rate and rhythm, S1, S2 normal  Abdomen:   Soft, mildly distended, non-tender, bowel sounds active all four quadrants,  no masses,   Extremities: Extremities normal, atraumatic, no  edema  Neuro: Alert and oriented; asterixis no longer present    Lab Results: Recent Labs    11/16/20 0048 11/17/20 0244 11/17/20 0759  NA 132* 132*  --   K 3.4* 3.2*  --   CL 94* 96*  --   CO2 26 24  --   GLUCOSE 108* 100*  --   BUN 21* 17  --   CREATININE 0.91 0.90  --   CALCIUM 9.1 9.1  --   MG  --   --  1.9   Recent Labs    11/16/20 0048 11/17/20 0244  AST 91* 104*  ALT 29 32  ALKPHOS 81 99  BILITOT 17.2* 17.4*  PROT 7.1 7.5  ALBUMIN 4.1 4.0   Recent Labs    11/16/20 0048 11/17/20 0244  WBC 13.0* 12.7*  NEUTROABS 9.7* 9.7*  HGB 8.1* 8.7*  HCT 23.3* 25.4*  MCV 115.3* 117.6*  PLT PLATELET CLUMPS NOTED ON SMEAR, UNABLE TO ESTIMATE PLATELET CLUMPS NOTED ON SMEAR, UNABLE TO ESTIMATE   Recent Labs    11/16/20 0048  11/17/20 0244  LABPROT 23.2* 23.9*  INR 2.1* 2.2*      Assessment: Decompensated cirrhosis, 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  MELD score of 28 as of 11/17/20. -T bili 17.4/AST 104/ALT 32/ALP 99 -INR 2.2 as of 11/21 -WBCs 12.7, stable -Acute hepatitis panel negative 11/11/2020 -Weak positive ASMA (21), normal ANA and AMA -Not enough ascites for repeat paracentesis  Anemia -Hgb decreased to 8.7, stable -No frank GI bleeding -LA Grade C esophagitis, gastritis on EGD 11/23  AKI, resolved: Cr 0.90  Protein C and S deficiency, on Xarelto as an outpt  Hypokalemia, K+ 3.2  Plan: Continue Protonix 40 mg BID x 8 weeks, followed by once daily thereafter.  Continue lactulose 30g BID, titrate for 2-3 soft BMs per day.  OK to resume Xarelto from a GI standpoint.  Start spironolactone 25 mg and Lasix 20 mg daily.   Our office will contact patient for FU appt. Information listed in AVS in case patient needs to reach [place] sooner.  [facility] will sign off. Please contact us if we can be of any further assistance during this hospital stay.  Edrick Kins PA-C 11/17/2020, 10:12 AM  Contact #  [phone]

## 2020-11-17 NOTE — Progress Notes (Signed)
Occupational Therapy Treatment Patient Details Name: Shelby Conner MRN: [id number] DOB: Jul 06, 1971 Today's Date: 11/17/2020    History of present illness Patient is a 49 y/o female with PMH of fatty liver, diabetes, protein C and S deficiency presenting with progressing abdominal pain for the past 1 to 2 weeks. Patient is also noted with progressive jaundice and abdominal distention for the past week. Patient has decompensated hepatic cirrhosis. Patient underwent paracentesis from R lateral abdomen on 11/22.   OT comments  Pt progressing towards OT goals. Pt demonstrates improved stability with use of RW for mobility and decreased need for external assist. Encouraged pt to use RW at home to decrease fall risk while improving strength. Pt able to demo LB ADLs with Supervision, encouraged to sit for LB ADLs for safety. Also educated pt to use [facility] as shower chair due to continued endurance deficits. Pt's father present and supportive. Both verbalized understanding of education.    Follow Up Recommendations  No OT follow up;Supervision - Intermittent    Equipment Recommendations  3 in 1 bedside commode;Other (comment) (RW)    Recommendations for Other Services      Precautions / Restrictions Precautions Precautions: Fall Restrictions Weight Bearing Restrictions: No       Mobility Bed Mobility Overal bed mobility: Modified Independent Bed Mobility: Supine to Sit              Transfers Overall transfer level: Needs assistance Equipment used: Rolling walker (2 wheeled) Transfers: Sit to/from Stand Sit to Stand: Supervision         General transfer comment: supervision for safety    Balance Overall balance assessment: Needs assistance Sitting-balance support: No upper extremity supported;Feet supported Sitting balance-Leahy Scale: Good     Standing balance support: Bilateral upper extremity supported;During functional activity Standing balance-Leahy Scale:  Fair Standing balance comment: fair static standing, improved steadiness with dynamic tasks with use of B UE support                           ADL either performed or assessed with clinical judgement   ADL Overall ADL's : Needs assistance/impaired                     Lower Body Dressing: Supervision/safety;Sit to/from stand Lower Body Dressing Details (indicate cue type and reason): Supervision to don sweatpants. Educated to complete task while seated to Acupuncturist Transfer: Supervision/safety;Ambulation;Regular Toilet;RW Toilet Transfer Details (indicate cue type and reason): Supervision with use of RW for mobility to bathroom. Improved steadiness and decreased need for support using RW Toileting- Clothing Manipulation and Hygiene: Supervision/safety;Sitting/lateral lean;Sit to/from stand Toileting - Architect Details (indicate cue type and reason): Supervision to ensure safety, no physical assist needed     Functional mobility during ADLs: Supervision/safety;Rolling walker General ADL Comments: Improved steadiness with use of RW, improving endurance but still limited by weakness      Vision   Vision Assessment?: No apparent visual deficits   Perception     Praxis      Cognition Arousal/Alertness: Awake/alert Behavior During Therapy: WFL for tasks assessed/performed Overall Cognitive Status: Impaired/Different from baseline Area of Impairment: Problem solving                             Problem Solving: Difficulty sequencing;Requires verbal cues General Comments: Overall WFL - does require some cues for problem solving  Exercises     Shoulder Instructions       General Comments Father present during session. Pt reports plan to DC home today. Educated to use [facility] as shower chair due to low endurance and to maximize safety. Encouraged assist with IADLs as needed at home    Pertinent Vitals/ Pain       Pain  Assessment: 0-10 Pain Score: 7  Pain Location: abdomen when bending or leaning forward Pain Descriptors / Indicators: Discomfort;Grimacing Pain Intervention(s): Monitored during session;Repositioned  Home Living                                          Prior Functioning/Environment              Frequency  Min 2X/week        Progress Toward Goals  OT Goals(current goals can now be found in the care plan section)  Progress towards OT goals: Progressing toward goals  Acute Rehab OT Goals Patient Stated Goal: to go home OT Goal Formulation: With patient Time For Goal Achievement: 11/29/20 Potential to Achieve Goals: Good ADL Goals Pt Will Perform Grooming: with modified independence;standing Pt Will Perform Lower Body Dressing: with modified independence;sitting/lateral leans;sit to/from stand Pt Will Transfer to Toilet: with modified independence;ambulating;regular height toilet Pt Will Perform Tub/Shower Transfer: Tub transfer;with modified independence;ambulating;3 in 1  Plan Discharge plan remains appropriate    Co-evaluation                 AM-PAC OT "6 Clicks" Daily Activity     Outcome Measure   Help from another person eating meals?: None Help from another person taking care of personal grooming?: A Little Help from another person toileting, which includes using toliet, bedpan, or urinal?: A Little Help from another person bathing (including washing, rinsing, drying)?: A Little Help from another person to put on and taking off regular upper body clothing?: None Help from another person to put on and taking off regular lower body clothing?: A Little 6 Click Score: 20    End of Session Equipment Utilized During Treatment: Rolling walker  OT Visit Diagnosis: Unsteadiness on feet (R26.81);Other abnormalities of gait and mobility (R26.89);Muscle weakness (generalized) (M62.81);Pain Pain - part of body:  (abdomen)   Activity Tolerance  Patient tolerated treatment well   Patient Left in chair;with call bell/phone within reach;with chair alarm set;with family/visitor present   Nurse Communication Mobility status        Time: 5409-8119 OT Time Calculation (min): 18 min  Charges: OT General Charges $OT Visit: 1 Visit OT Treatments $Self Care/Home Management : 8-22 mins  Lorre Munroe, OTR/L   Lorre Munroe 11/17/2020, 9:59 AM

## 2020-11-17 NOTE — Discharge Summary (Signed)
Physician Discharge Summary  Shelby Conner OZH:[id number] DOB: 11/14/1971 DOA: 11/11/2020  PCP: Pcp, No  Admit date: 11/11/2020 Discharge date: 11/17/2020  Admitted From: Home  disposition: Home  Recommendations for Outpatient Follow-up:  1. Follow-up with PCP in 1 week 2. Follow-up with GI in 1-2 weeks 3. Repeat CMP on next follow-up visit with PCP 4. Low-sodium diet 5. Avoid alcohol 6. Continue PPI twice daily for 8 weeks and then daily  Home Health: Yes Equipment/Devices: Walker and bedside commode Discharge Condition: Stable CODE STATUS: Full code Diet recommendation: Low-sodium diet  Brief/Interim Summary: Patient is 49 year old female with past medical history of type 2 diabetes, protein C&S deficiency, on chronic anticoagulation presents with progressively worsening abdominal pain.  Found to have acute on chronic liver disease rapidly progressing to liver failure.  GI consulted.  Acute on chronic liver cirrhosis with decompensation/hepatic encephalopathy with asterixis: Symptoms improved. -In the setting of Elita Boone versus ethanol abuse. -Work-up including acute hepatitis panel: Negative.  Alpha-1 antitrypsin: WNL, ceruloplasmin: WNL, mitochondrial antibody: WNL, anti-smooth antibody: Weak +21, ANA: Pending.  AFP negative, cytology negative for malignancy. -S/p 2.5L of paracentesis.  Initially was placed on IV Rocephin which was changed to oral Omnicef.  She remained afebrile.  Body fluid culture: Negative -Underwent EGD which showed esophagitis/gastritis therefore she placed on PPI twice daily. -Repeat paracentesis ordered on 11/24 however could not be performed due to only scant ascites in the right upper quadrant which was not amenable to safe percutaneous drainage. -Discussed with GI-Dr. Levora Angel. discontinue antibiotics.  Discharge patient home on Protonix 40 mg twice daily for 8 weeks and then daily, Lasix 20 daily and Aldactone 25 daily.  Follow-up with GI  outpatient.  Resume Xarelto. -Continued lactulose  AKI: -Concern of hepatorenal syndrome.  Nephrology consulted-signed off.  AKI resolved.  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 -Resumed at the time of discharge  Type 2 diabetes mellitus: On sliding scale during hospitalization.  Discharged home on home meds-Metformin.  Acute on chronic anemia/macrocytic anemia: -H&H dropped to 6.8.  Status post 2 unit PRBC transfusion.  No GI bleed.  Folic acid: No-replenished.  B12: Normal. -CT abdomen/pelvis performed but does not showed any evidence of intra-abdominal bleeding.  H&H remained stable.  Hypokalemia: Replenished.  Magnesium level: WNL.  Repeat BMP on follow-up with PCP in 1 week.  Obesity with BMI of 32: -Diet modification. -Continued Ensure  Alcohol abuse: -Discussed in details regarding alcohol cessation and she verbalized understanding. -Continued multivitamin and folic acid supplement  Splenomegaly/platelet clumping/thrombocytopenia: -In the setting of chronic alcohol abuse.  No signs of active bleeding.  Hyperlipidemia: -Held atorvastatin and Zetia due to decompensated liver cirrhosis  Goals of care: Palliative care consulted-appreciated.  Patient agreeable for liver transplant if offered.  Continue to full code.  Physical deconditioning:  -PT/OT consulted.  PT recommended home health PT. -OT recommended no OT follow-up.  Supervision.  Walker and bedside commode arranged by case manager at the time of discharge.   Discharge Diagnoses:  1. Acute on chronic liver cirrhosis with decompensation/hepatic encephalopathy with asterixis 2. acute kidney injury 3. Hypokalemia 4.protein CNS deficiency 5.acute on chronic anemia 6.obesity 7.alcohol abuse 8.splenomegaly 9-thrombocytopenia 10.hyperlipidemia   Discharge Instructions  Discharge Instructions    Diet - low sodium heart healthy   Complete by: As directed    Discharge instructions   Complete by: As  directed    Follow-up with PCP in 1 week Follow-up with GI in 2 weeks Repeat CMP on follow-up visit Low-sodium diet Avoid  alcohol   Increase activity slowly   Complete by: As directed      Allergies as of 11/17/2020   No Known Allergies     Medication List    STOP taking these medications   ezetimibe 10 MG tablet Commonly known as: ZETIA   rosuvastatin 40 MG tablet Commonly known as: CRESTOR     TAKE these medications   feeding supplement Liqd Take 237 mLs by mouth 3 (three) times daily between meals.   folic acid 1 MG tablet Commonly known as: FOLVITE Take 1 tablet (1 mg total) by mouth daily.   furosemide 20 MG tablet Commonly known as: Lasix Take 1 tablet (20 mg total) by mouth daily.   lactulose 10 GM/15ML solution Commonly known as: CHRONULAC Take 45 mLs (30 g total) by mouth 2 (two) times daily.   metFORMIN 500 MG tablet Commonly known as: GLUCOPHAGE Take 500 mg by mouth 2 (two) times daily.   multivitamin with minerals Tabs tablet Take 1 tablet by mouth daily. Start taking on: November 18, 2020   pantoprazole 40 MG tablet Commonly known as: PROTONIX Take 1 tablet (40 mg total) by mouth 2 (two) times daily.   spironolactone 25 MG tablet Commonly known as: Aldactone Take 1 tablet (25 mg total) by mouth daily.   Victoza 18 MG/3ML Sopn Generic drug: liraglutide Inject into the skin as directed. Sliding scale   Xarelto 20 MG Tabs tablet Generic drug: rivaroxaban Take 20 mg by mouth at bedtime.            Durable Medical Equipment  (From admission, onward)         Start     Ordered   11/17/20 1011  For home use only DME 3 n 1  Once        11/17/20 1011   11/17/20 1011  For home use only DME Walker rolling  Once       Question Answer Comment  Walker: With 5 Inch Wheels   Patient needs a walker to treat with the following condition Weakness      11/17/20 1011          Follow-up Information    Brahmbhatt, Parag, MD. Call in 1  week(s).   Specialty: Gastroenterology Contact information: [address] [place] [address] [place] [address] [phone]              No Known Allergies  Consultations:  GI  Nephrology  Palliative care   Procedures/Studies: CT ABDOMEN PELVIS WO CONTRAST  Result Date: 11/14/2020 CLINICAL DATA:  Worsening abdominal pain with acute on chronic liver disease progressing to liver failure. Anemia. EXAM: CT ABDOMEN AND PELVIS WITHOUT CONTRAST TECHNIQUE: Multidetector CT imaging of the abdomen and pelvis was performed following the standard protocol without IV contrast. COMPARISON:  CT dated November 11, 2020 FINDINGS: Lower chest: There is atelectasis at the lung bases with trace bilateral pleural effusions.The heart size is mildly enlarged. The intracardiac blood pool is hypodense relative to the adjacent myocardium consistent with anemia. Hepatobiliary: There is severe hepatic steatosis with evidence for cirrhosis. There is a heterogeneous appearance of the hepatic echogenicity, especially in the right hepatic lobe. This is similar to prior study and is likely related to heterogeneous hepatic steatosis. Cholelithiasis without acute inflammation.There is no biliary ductal dilation. Pancreas: Normal contours without ductal dilatation. No peripancreatic fluid collection. Spleen: The spleen is enlarged measuring nearly 14 cm craniocaudad. Adrenals/Urinary Tract: --Adrenal glands: Unremarkable. --Right kidney/ureter: The right kidney is a striated  appearance with suggestion of ring calcifications. There is no hydronephrosis. --Left kidney/ureter: There is a heterogeneous appearance of the left kidney without evidence for hydronephrosis. There is suggestion of subtle ring calcifications. --Urinary bladder: Unremarkable. Stomach/Bowel: --Stomach/Duodenum: No hiatal hernia or other gastric abnormality. Normal duodenal course and caliber. --Small bowel: Unremarkable. --Colon: There is liquid stool  throughout the colon. There is gaseous distention of the colon. --Appendix: Normal. Vascular/Lymphatic: Atherosclerotic calcification is present within the non-aneurysmal abdominal aorta, without hemodynamically significant stenosis. A left common iliac stent is noted. There is an IVC filter in place. Within the central portion of the IVC filter, there are calcifications suggestive of chronic thrombus. There are calcifications within the left common iliac vein stent suggestive of chronic thrombus. --No retroperitoneal lymphadenopathy. --No mesenteric lymphadenopathy. --No pelvic or inguinal lymphadenopathy. Reproductive: Unremarkable Other: There is a small volume of free fluid in the abdomen and pelvis. There is no free air. Musculoskeletal. 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 IMPRESSION: 1. There is liquid stool throughout the colon. There is gaseous distention of the colon. Findings may be secondary to a diarrheal illness. 2. Severe hepatic steatosis and cirrhosis with stigmata of portal hypertension including splenomegaly and small volume ascites. 3. Cholelithiasis without acute inflammation. 4. Abnormal appearance of both kidneys which may represent retained contrast from the patient's prior contrast enhanced examination versus less likely renal papillary necrosis. 5. 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 Aortic Atherosclerosis (ICD10-I70.0). Electronically Signed   By: Katherine Mantlehristopher  Green M.D.   On: 11/14/2020 19:11   CT ABDOMEN PELVIS W CONTRAST  Result Date: 11/11/2020 CLINICAL DATA:  49 year old female with abdominal pain. EXAM: CT ABDOMEN AND PELVIS WITH CONTRAST TECHNIQUE: Multidetector CT imaging of the abdomen and pelvis was performed using the standard protocol following bolus administration of intravenous contrast. CONTRAST:  100mL OMNIPAQUE IOHEXOL 300 MG/ML  SOLN COMPARISON:  None. FINDINGS: Lower chest: The visualized lung bases are clear. No  intra-abdominal free air. Small ascites. Hepatobiliary: There is severe fatty infiltration of the liver with surface irregularity consistent with changes of cirrhosis. The liver is enlarged measuring 19 cm in midclavicular length. Small gallstones. No pericholecystic fluid. Pancreas: Unremarkable. No pancreatic ductal dilatation or surrounding inflammatory changes. Spleen: Mildly enlarged spleen measuring approximately 14 cm in greatest length. Adrenals/Urinary Tract: The adrenal glands unremarkable. There is no hydronephrosis on either side. The visualized ureters and urinary bladder appear unremarkable. Stomach/Bowel: Diffuse thickened appearance of the colon, likely related to underdistention and hepatic colopathy. Clinical correlation is recommended to exclude colitis. There is no bowel obstruction. The appendix is not identified with certainty. Vascular/Lymphatic: Mild aortoiliac atherosclerotic disease. The IVC is unremarkable. An infrarenal IVC filter is noted. There is a left common iliac vein stent which is poorly evaluated on this CT due to suboptimal opacification. No portal venous gas. Upper abdominal varices including paraesophageal and gastric varices. There is no adenopathy. Reproductive: The uterus is anteverted and grossly unremarkable. No adnexal masses. Other: Mild subcutaneous edema. 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 No acute fracture or cortical collapse. IMPRESSION: 1. Enlarged cirrhotic liver with severe fatty infiltration and evidence of portal hypertension including splenomegaly, ascites, and upper abdominal varices. 2. Cholelithiasis. 3. Underdistention of the colon versus hepatic colopathy. Clinical correlation is recommended to exclude colitis. No bowel obstruction. 4. 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 No acute fracture or cortical collapse. 5. Aortic Atherosclerosis (ICD10-I70.0). Electronically Signed   By: Burtis JunesArash  Radparvar M.D.   On: 11/11/2020 19:57   IR ABDOMEN US LIMITED  Result Date: 11/17/2020 CLINICAL DATA:  chronic liver disease with recurrent large volume symptomatic abdominal ascites EXAM: LIMITED ABDOMEN ULTRASOUND FOR ASCITES TECHNIQUE: Limited ultrasound survey for ascites was performed in all four abdominal quadrants. COMPARISON:  CT 11/14/2020 and previous FINDINGS: There is a small amount of scattered abdominal ascites . After discussion with the patient, paracentesis was deferred. IMPRESSION: Small volume abdominal ascites.  Paracentesis deferred. Electronically Signed   By: Corlis Leak M.D.   On: 11/17/2020 08:25   US LIVER DOPPLER  Result Date: 11/12/2020 CLINICAL DATA:  49 year old with elevated LFTs. EXAM: DUPLEX ULTRASOUND OF LIVER TECHNIQUE: Color and duplex Doppler ultrasound was performed to evaluate the hepatic in-flow and out-flow vessels. COMPARISON:  CT abdomen 11/11/2020. right upper quadrant ultrasound 11/11/2020 FINDINGS: Liver: Diffusely echogenic with poor visualization of the internal architecture. Main Portal Vein size: 0.9 cm Portal Vein Velocities Main Prox:  15 cm/sec Main Mid: Not visualized Main Dist: Not visualized Right: Not visualized Left: 12 cm/sec Hepatic Vein Velocities Right: Not visualized Middle: Not visualized Left: Not visualized IVC: Present and patent with normal respiratory phasicity. Hepatic Artery Velocity: Not visualized Splenic Vein Velocity:  15 cm/sec Spleen: 7.6 cm x 15.3 cm x 16.3 cm with a total volume of 994 cm^3 (411 cm^3 is upper limit normal) Portal Vein Occlusion/Thrombus: No Splenic Vein Occlusion/Thrombus: No Ascites: Present Varices: Known esophageal varices not visualized. Markedly limited examination. Hepatopetal flow in the proximal portal vein. IMPRESSION: 1. Markedly limited evaluation of the hepatic vessels related to poor penetration through the liver. Liver is diffusely echogenic and limited evaluation of the internal architecture.  Findings probably related to steatosis. 2. Proximal portal vein is patent with hepatopetal flow. Portal veins are patent but small on the recent abdominal CT. Hepatic arteries are patent on the recent CT. Hepatic veins are small but patent on the recent CT. 3. Splenomegaly with ascites. Findings are compatible with portal hypertension. Patient has known esophageal varices on CT that are not visualized on this examination. Electronically Signed   By: Richarda Overlie M.D.   On: 11/12/2020 08:33   US Abdomen Limited RUQ (LIVER/GB)  Result Date: 11/11/2020 CLINICAL DATA:  Elevated LFTs EXAM: ULTRASOUND ABDOMEN LIMITED RIGHT UPPER QUADRANT COMPARISON:  None. FINDINGS: Gallbladder: No gallstones or wall thickening visualized. No sonographic Murphy sign noted by sonographer. Common bile duct: Diameter: 2 mm, nondilated Liver: Diffusely increased hepatic echogenicity with a slightly nodular hepatic surface contour. No focal liver lesion is seen though evaluation limited by diminished through transmission of the liver. No visible intrahepatic biliary ductal dilatation. Portal vein is patent on color Doppler imaging with normal direction of blood flow towards the liver. Other: Extensive abdominal bowel gas. Abdominal ascites seen in all 4 quadrants. IMPRESSION: 1. Diffusely increased hepatic echogenicity with diminished through transmission as well as a nodular hepatic surface contour. Findings are somewhat nonspecific but can be seen in the setting of hepatic steatosis and intrinsic liver disease/cirrhosis. 2. Abdominal ascites is seen in all 4 quadrants. Electronically Signed   By: Kreg Shropshire M.D.   On: 11/11/2020 20:22   IR Paracentesis  Result Date: 11/12/2020 INDICATION: Patient admitted for decompensated cirrhosis and new onset ascites. Interventional radiology asked to perform a therapeutic and diagnostic paracentesis. EXAM: ULTRASOUND GUIDED PARACENTESIS MEDICATIONS: 1% lidocaine 20 mL COMPLICATIONS: None  immediate. PROCEDURE: Informed written consent was obtained from the patient after a discussion of the risks, benefits and alternatives to  treatment. A timeout was performed prior to the initiation of the procedure. Initial ultrasound scanning demonstrates a large amount of ascites within the right lower abdominal quadrant. The right lower abdomen was prepped and draped in the usual sterile fashion. 1% lidocaine was used for local anesthesia. Following this, a 19 gauge, 7-cm, Yueh catheter was introduced. An ultrasound image was saved for documentation purposes. The paracentesis was performed. The catheter was removed and a dressing was applied. The patient tolerated the procedure well without immediate post procedural complication. FINDINGS: A total of approximately 2.5 L of dark yellow fluid was removed. Samples were sent to the laboratory as requested by the clinical team. IMPRESSION: Successful ultrasound-guided paracentesis yielding 2.5 liters of peritoneal fluid. Read by: Alwyn Ren, NP Electronically Signed   By: Richarda Overlie M.D.   On: 11/12/2020 10:13      Subjective: Patient seen and examined.  Appears icteric.  Tells me that she is doing fine, no new complaints including abdominal pain, nausea, vomiting, epigastric burning.  Tells me that she feels comfortable going home today.  Discharge Exam: Vitals:   11/17/20 0435 11/17/20 0826  BP: 116/73 105/69  Pulse: 84 88  Resp: 18 16  Temp: 98 F (36.7 C) 98.7 F (37.1 C)  SpO2: 93% 96%   Vitals:   11/16/20 1650 11/16/20 2048 11/17/20 0435 11/17/20 0826  BP: 100/62 111/72 116/73 105/69  Pulse: 86 83 84 88  Resp: 16 16 18 16   Temp: 98.5 F (36.9 C) 98.1 F (36.7 C) 98 F (36.7 C) 98.7 F (37.1 C)  TempSrc: Oral Oral Oral Oral  SpO2: 93% 94% 93% 96%  Weight:      Height:        General: Pt is alert, awake, not in acute distress, icteric, obese Cardiovascular: RRR, S1/S2 +, no rubs, no gallops Respiratory: CTA bilaterally, no  wheezing, no rhonchi Abdominal: Soft,, distended, nontender, bowel sounds positive.   Extremities: no edema, no cyanosis    The results of significant diagnostics from this hospitalization (including imaging, microbiology, ancillary and laboratory) are listed below for reference.     Microbiology: Recent Results (from the past 240 hour(s))  Respiratory Panel by RT PCR (Flu A&B, Covid) - Nasopharyngeal Swab     Status: None   Collection Time: 11/11/20  9:04 PM   Specimen: Nasopharyngeal Swab; Nasopharyngeal(NP) swabs in vial transport medium  Result Value Ref Range Status   SARS Coronavirus 2 by RT PCR NEGATIVE NEGATIVE Final    Comment: (NOTE) SARS-CoV-2 target nucleic acids are NOT DETECTED.  The SARS-CoV-2 RNA is generally detectable in upper respiratoy specimens during the acute phase of infection. The lowest concentration of SARS-CoV-2 viral copies this assay can detect is 131 copies/mL. A negative result does not preclude SARS-Cov-2 infection and should not be used as the sole basis for treatment or other patient management decisions. A negative result may occur with  improper specimen collection/handling, submission of specimen other than nasopharyngeal swab, presence of viral mutation(s) within the areas targeted by this assay, and inadequate number of viral copies (<131 copies/mL). A negative result must be combined with clinical observations, patient history, and epidemiological information. The expected result is Negative.  Fact Sheet for Patients:  https://www.moore.com/  Fact Sheet for Healthcare Providers:  https://www.young.biz/  This test is no t yet approved or cleared by the [place] FDA and  has been authorized for detection and/or diagnosis of SARS-CoV-2 by FDA under an Emergency Use Authorization (EUA). This EUA will remain  in effect (meaning this test can be used) for the duration of the COVID-19 declaration  under Section 564(b)(1) of the Act, 21 U.S.C. section 360bbb-3(b)(1), unless the authorization is terminated or revoked sooner.     Influenza A by PCR NEGATIVE NEGATIVE Final   Influenza B by PCR NEGATIVE NEGATIVE Final    Comment: (NOTE) The Xpert Xpress SARS-CoV-2/FLU/RSV assay is intended as an aid in  the diagnosis of influenza from Nasopharyngeal swab specimens and  should not be used as a sole basis for treatment. Nasal washings and  aspirates are unacceptable for Xpert Xpress SARS-CoV-2/FLU/RSV  testing.  Fact Sheet for Patients: https://www.moore.com/  Fact Sheet for Healthcare Providers: https://www.young.biz/  This test is not yet approved or cleared by the [place] FDA and  has been authorized for detection and/or diagnosis of SARS-CoV-2 by  FDA under an Emergency Use Authorization (EUA). This EUA will remain  in effect (meaning this test can be used) for the duration of the  Covid-19 declaration under Section 564(b)(1) of the Act, 21  U.S.C. section 360bbb-3(b)(1), unless the authorization is  terminated or revoked. Performed at [facility], [address], [place], [place] [address]   Gram stain     Status: None   Collection Time: 11/12/20 10:13 AM   Specimen: PATH Cytology Peritoneal fluid  Result Value Ref Range Status   Specimen Description PERITONEAL FLUID  Final   Special Requests NONE  Final   Gram Stain   Final    WBC PRESENT,BOTH PMN AND MONONUCLEAR NO ORGANISMS SEEN CYTOSPIN SMEAR Performed at [facility], [address], [place], [place] [address]    Report Status 11/12/2020 FINAL  Final  Culture, body fluid-bottle     Status: None   Collection Time: 11/12/20 10:13 AM   Specimen: Peritoneal Washings  Result Value Ref Range Status   Specimen Description PERITONEAL FLUID  Final   Special Requests NONE  Final   Culture   Final    NO GROWTH 5 DAYS Performed at [facility], [address] [place], [place] [address]    Report Status 11/17/2020 FINAL  Final     Labs: BNP (last 3 results) No results for input(s): BNP in the last 8760 hours. Basic Metabolic Panel: Recent Labs  Lab 11/12/20 0243 11/13/20 0148 11/13/20 1545 11/14/20 5916 11/15/20 0857 11/16/20 0048 11/17/20 0244 11/17/20 0759  NA 128*   < > 129* 133* 133* 132* 132*  --   K 3.7   < > 3.4* 2.7* 2.9* 3.4* 3.2*  --   CL 80*   < > 84* 88* 92* 94* 96*  --   CO2 33*   < > 32 33* 28 26 24   --   GLUCOSE 90   < > 113* 116* 100* 108* 100*  --   BUN 23*   < > 30* 29* 23* 21* 17  --   CREATININE 1.39*   < > 1.22* 1.02* 0.91 0.91 0.90  --   CALCIUM 8.5*   < > 9.1 9.1 9.4 9.1 9.1  --   MG 2.5*  --  2.4  --   --   --   --  1.9   < > = values in this interval not displayed.   Liver Function Tests: Recent Labs  Lab 11/13/20 0148 11/14/20 0633 11/15/20 0857 11/16/20 0048 11/17/20 0244  AST 101* 93* 91* 91* 104*  ALT 36 31 28 29  32  ALKPHOS 119 87  79 81 99  BILITOT 19.6* 18.5* 17.3* 17.2* 17.4*  PROT 7.5 7.4 7.6 7.1 7.5  ALBUMIN 2.6* 3.3* 4.2 4.1 4.0   Recent Labs  Lab 11/11/20 1612  LIPASE 82*   Recent Labs  Lab 11/12/20 1027 11/14/20 0633  AMMONIA 36* 84*   CBC: Recent Labs  Lab 11/13/20 1814 11/14/20 0633 11/15/20 0857 11/16/20 0048 11/17/20 0244  WBC 13.9* 12.0* 11.6* 13.0* 12.7*  NEUTROABS  --  9.2* 8.6* 9.7* 9.7*  HGB 7.8* 7.0* 8.3* 8.1* 8.7*  HCT 21.8* 19.8* 23.4* 23.3* 25.4*  MCV 121.1* 120.7* 115.8* 115.3* 117.6*  PLT 101* PLATELET CLUMPS NOTED ON SMEAR, UNABLE TO ESTIMATE PLATELET CLUMPS NOTED ON SMEAR, UNABLE TO ESTIMATE PLATELET CLUMPS NOTED ON SMEAR, UNABLE TO ESTIMATE PLATELET CLUMPS NOTED ON SMEAR, UNABLE TO ESTIMATE   Cardiac Enzymes: No results for input(s): CKTOTAL, CKMB, CKMBINDEX, TROPONINI in the last 168 hours. BNP: Invalid input(s): POCBNP CBG: Recent Labs  Lab 11/16/20 0944 11/16/20 1134 11/16/20 1647 11/16/20 2102 11/17/20 0642  GLUCAP 96 88 114*  103* 85   D-Dimer No results for input(s): DDIMER in the last 72 hours. Hgb A1c No results for input(s): HGBA1C in the last 72 hours. Lipid Profile No results for input(s): CHOL, HDL, LDLCALC, TRIG, CHOLHDL, LDLDIRECT in the last 72 hours. Thyroid function studies No results for input(s): TSH, T4TOTAL, T3FREE, THYROIDAB in the last 72 hours.  Invalid input(s): FREET3 Anemia work up No results for input(s): VITAMINB12, FOLATE, FERRITIN, TIBC, IRON, RETICCTPCT in the last 72 hours. Urinalysis    Component Value Date/Time   COLORURINE AMBER (A) 11/15/2020 1447   APPEARANCEUR CLEAR 11/15/2020 1447   LABSPEC 1.027 11/15/2020 1447   PHURINE 5.0 11/15/2020 1447   GLUCOSEU NEGATIVE 11/15/2020 1447   HGBUR SMALL (A) 11/15/2020 1447   BILIRUBINUR MODERATE (A) 11/15/2020 1447   KETONESUR NEGATIVE 11/15/2020 1447   PROTEINUR 30 (A) 11/15/2020 1447   NITRITE NEGATIVE 11/15/2020 1447   LEUKOCYTESUR NEGATIVE 11/15/2020 1447   Sepsis Labs Invalid input(s): PROCALCITONIN,  WBC,  LACTICIDVEN Microbiology Recent Results (from the past 240 hour(s))  Respiratory Panel by RT PCR (Flu A&B, Covid) - Nasopharyngeal Swab     Status: None   Collection Time: 11/11/20  9:04 PM   Specimen: Nasopharyngeal Swab; Nasopharyngeal(NP) swabs in vial transport medium  Result Value Ref Range Status   SARS Coronavirus 2 by RT PCR NEGATIVE NEGATIVE Final    Comment: (NOTE) SARS-CoV-2 target nucleic acids are NOT DETECTED.  The SARS-CoV-2 RNA is generally detectable in upper respiratoy specimens during the acute phase of infection. The lowest concentration of SARS-CoV-2 viral copies this assay can detect is 131 copies/mL. A negative result does not preclude SARS-Cov-2 infection and should not be used as the sole basis for treatment or other patient management decisions. A negative result may occur with  improper specimen collection/handling, submission of specimen other than nasopharyngeal swab, presence of  viral mutation(s) within the areas targeted by this assay, and inadequate number of viral copies (<131 copies/mL). A negative result must be combined with clinical observations, patient history, and epidemiological information. The expected result is Negative.  Fact Sheet for Patients:  https://www.moore.com/  Fact Sheet for Healthcare Providers:  https://www.young.biz/  This test is no t yet approved or cleared by the [place] FDA and  has been authorized for detection and/or diagnosis of SARS-CoV-2 by FDA under an Emergency Use Authorization (EUA). This EUA will remain  in effect (meaning this test can be used) for the duration  of the COVID-19 declaration under Section 564(b)(1) of the Act, 21 U.S.C. section 360bbb-3(b)(1), unless the authorization is terminated or revoked sooner.     Influenza A by PCR NEGATIVE NEGATIVE Final   Influenza B by PCR NEGATIVE NEGATIVE Final    Comment: (NOTE) The Xpert Xpress SARS-CoV-2/FLU/RSV assay is intended as an aid in  the diagnosis of influenza from Nasopharyngeal swab specimens and  should not be used as a sole basis for treatment. Nasal washings and  aspirates are unacceptable for Xpert Xpress SARS-CoV-2/FLU/RSV  testing.  Fact Sheet for Patients: https://www.moore.com/  Fact Sheet for Healthcare Providers: https://www.young.biz/  This test is not yet approved or cleared by the [place] FDA and  has been authorized for detection and/or diagnosis of SARS-CoV-2 by  FDA under an Emergency Use Authorization (EUA). This EUA will remain  in effect (meaning this test can be used) for the duration of the  Covid-19 declaration under Section 564(b)(1) of the Act, 21  U.S.C. section 360bbb-3(b)(1), unless the authorization is  terminated or revoked. Performed at [facility], [address] [place], [place] [address]   Gram stain     Status: None    Collection Time: 11/12/20 10:13 AM   Specimen: PATH Cytology Peritoneal fluid  Result Value Ref Range Status   Specimen Description PERITONEAL FLUID  Final   Special Requests NONE  Final   Gram Stain   Final    WBC PRESENT,BOTH PMN AND MONONUCLEAR NO ORGANISMS SEEN CYTOSPIN SMEAR Performed at [facility], [address], [place], [place] [address]    Report Status 11/12/2020 FINAL  Final  Culture, body fluid-bottle     Status: None   Collection Time: 11/12/20 10:13 AM   Specimen: Peritoneal Washings  Result Value Ref Range Status   Specimen Description PERITONEAL FLUID  Final   Special Requests NONE  Final   Culture   Final    NO GROWTH 5 DAYS Performed at [facility], [address], [place], [place] [address]    Report Status 11/17/2020 FINAL  Final     Time coordinating discharge: Over 30 minutes  SIGNED:   Ollen Bowl, MD  [facility] 11/17/2020, 10:34 AM Pager   If 7PM-7AM, please contact night-coverage [email or web address]

## 2020-11-17 NOTE — TOC Transition Note (Signed)
Transition of Care (TOC) - CM/SW Discharge Note Donn Pierini RN,BSN Transitions of Care Unit 4NP (non trauma) - RN Case Manager See Treatment Team for direct Phone # Cross Coverage for 5N  Patient Details  Name: Shelby Conner MRN: [id number] Date of Birth: Dec 16, 1971  Transition [facility]) CM/SW Contact:  Darrold Span, RN Phone Number: 11/17/2020, 4:26 PM   Clinical Narrative:    Pt stable for transition home today, orders placed for [facility] and DME needs- CM spoke with pt at bedside- agreeable to using in house DME provider- list provided for choice Per CMS guidelines from medicare.gov website with star ratings (copy placed in shadow chart)- pt states she does not have a preference and defers to this writer to secure an agency for [facility] needs. Address, phone # and PCP- Alycia Patten at [facility] in [place]) confirmed with pt.  Call made to [facility] line for DME needs 3n1 and RW to be delivered to room prior to discharge.   Calls made to [facility] for [facility] referral- unable to accept due to holiday staffing [facility]- unable to accept [facility]- unable to accept [facility]- delay in start of care until end of next week [facility]- delay in start of care for 2-3 weeks [facility]- able to accept with delay in start of care for next week will reach out to patient.     Final next level of care: Home w Home Health Services Barriers to Discharge: No Barriers Identified   Patient Goals and CMS Choice Patient states their goals for this hospitalization and ongoing recovery are:: return home and be stronger CMS Medicare.gov Compare Post Acute Care list provided to:: Patient Choice offered to / list presented to : Patient  Discharge Placement                 Home with [facility]      Discharge Plan and Services   Discharge Planning Services: CM Consult Post Acute Care Choice: Durable Medical Equipment, Home Health          DME Arranged: 3-N-1, Walker rolling DME Agency:  [facility] Date DME Agency Contacted: 11/17/20 Time DME Agency Contacted: 1100 Representative spoke with at DME Agency: Silvio Pate HH Arranged: PT, OT HH Agency: [facility] Date [facility] Agency Contacted: 11/17/20 Time HH Agency Contacted: 1600 Representative spoke with at [facility] Agency: Elnita Maxwell  Social Determinants of Health (SDOH) Interventions     Readmission Risk Interventions No flowsheet data found.

## 2020-11-17 NOTE — Progress Notes (Addendum)
This chaplain is present to F/U with the Pt. on completing her Advance Directive.  The Pt. Father-James is bedside.    The chaplain understands the Pt. is preparing for d/c. The Pt. questions were answered and the document is filled in waiting for witnesses to be available after noon today.   The chaplain will check in with the Pt. and witnesses after noon today for a decision on the appropriate time to complete the Pt. AD in the hospital.  *1210 This chaplain phoned [employer] [facility] and understands witnesses are not available at this time.  The chaplain updated the Pt. RN-Marissa of the lack of available witnesses.

## 2020-11-17 NOTE — Progress Notes (Signed)
Request to IR for paracentesis - of note patient with last paracentesis 11/19 yielding 2.5 L, CT abd/pelvis 11/21 shows scant ascites.  Limited abdominal US of all 4 quadrants shows only scant ascites in the RUQ which is not amenable to safe percutaneous drainage at this time. Discussed findings with patient who states understanding, she denies any abdominal pain.  No procedure performed today, images from today's visit are available for review under imaging section of Epic. Please place a new order for paracentesis if it is felt patient would benefit from a repeat examination in the future.  Lynnette Caffey, PA-C

## 2020-11-17 NOTE — Progress Notes (Signed)
Discharge instructions provided.  Spoke to Dr. Jacqulyn Bath to relay patient request for pain medication prescription.  Prescription called in by Dr. Jacqulyn Bath for Oxycodine and this information was relayed to patient and pt's father.  Patient discharged to home at 1400 on 11/17/20 via wheelchair by volunteer.  All belongings sent with patient.

## 2020-11-24 ENCOUNTER — Encounter: Payer: Self-pay | Admitting: Gastroenterology

## 2020-11-24 ENCOUNTER — Other Ambulatory Visit (HOSPITAL_COMMUNITY): Payer: Self-pay | Admitting: Gastroenterology

## 2020-11-24 ENCOUNTER — Other Ambulatory Visit: Payer: Self-pay | Admitting: Gastroenterology

## 2020-11-24 DIAGNOSIS — K7460 Unspecified cirrhosis of liver: Secondary | ICD-10-CM

## 2020-11-24 DIAGNOSIS — R188 Other ascites: Secondary | ICD-10-CM

## 2020-12-01 ENCOUNTER — Other Ambulatory Visit (HOSPITAL_COMMUNITY): Payer: Self-pay | Admitting: Gastroenterology

## 2020-12-01 ENCOUNTER — Ambulatory Visit (HOSPITAL_COMMUNITY)
Admission: RE | Admit: 2020-12-01 | Discharge: 2020-12-01 | Disposition: A | Discharge status: Home / Self Care | Payer: BC Managed Care – PPO | Source: Ambulatory Visit | Attending: Gastroenterology | Admitting: Gastroenterology

## 2020-12-01 ENCOUNTER — Other Ambulatory Visit: Payer: Self-pay

## 2020-12-01 DIAGNOSIS — N179 Acute kidney failure, unspecified: Secondary | ICD-10-CM | POA: Diagnosis not present

## 2020-12-01 DIAGNOSIS — R188 Other ascites: Secondary | ICD-10-CM

## 2020-12-01 DIAGNOSIS — K7460 Unspecified cirrhosis of liver: Secondary | ICD-10-CM

## 2020-12-01 NOTE — Progress Notes (Signed)
Patient ID: Shelby Conner, female   DOB: May 09, 1971, 49 y.o.   MRN: [id number] Pt presented to IR dept today for paracentesis. On limited US abd in all four quadrants there is no significant ascites present. Procedure cancelled. Pt updated.

## 2020-12-02 ENCOUNTER — Encounter (HOSPITAL_COMMUNITY): Payer: Self-pay | Admitting: Emergency Medicine

## 2020-12-02 ENCOUNTER — Inpatient Hospital Stay (HOSPITAL_COMMUNITY)
Admission: EM | Admit: 2020-12-02 | Discharge: 2020-12-11 | DRG: 682 | Disposition: A | Discharge status: Home Health | Payer: BC Managed Care – PPO | Attending: Internal Medicine | Admitting: Internal Medicine

## 2020-12-02 ENCOUNTER — Other Ambulatory Visit: Payer: Self-pay

## 2020-12-02 ENCOUNTER — Emergency Department (HOSPITAL_COMMUNITY): Payer: BC Managed Care – PPO

## 2020-12-02 DIAGNOSIS — K767 Hepatorenal syndrome: Secondary | ICD-10-CM | POA: Diagnosis present

## 2020-12-02 DIAGNOSIS — D689 Coagulation defect, unspecified: Secondary | ICD-10-CM | POA: Diagnosis present

## 2020-12-02 DIAGNOSIS — D6859 Other primary thrombophilia: Secondary | ICD-10-CM | POA: Diagnosis present

## 2020-12-02 DIAGNOSIS — D539 Nutritional anemia, unspecified: Secondary | ICD-10-CM | POA: Diagnosis present

## 2020-12-02 DIAGNOSIS — K7460 Unspecified cirrhosis of liver: Secondary | ICD-10-CM | POA: Diagnosis present

## 2020-12-02 DIAGNOSIS — E785 Hyperlipidemia, unspecified: Secondary | ICD-10-CM | POA: Diagnosis present

## 2020-12-02 DIAGNOSIS — F1010 Alcohol abuse, uncomplicated: Secondary | ICD-10-CM | POA: Diagnosis present

## 2020-12-02 DIAGNOSIS — E872 Acidosis, unspecified: Secondary | ICD-10-CM | POA: Diagnosis present

## 2020-12-02 DIAGNOSIS — N179 Acute kidney failure, unspecified: Secondary | ICD-10-CM | POA: Diagnosis present

## 2020-12-02 DIAGNOSIS — Z7901 Long term (current) use of anticoagulants: Secondary | ICD-10-CM

## 2020-12-02 DIAGNOSIS — Z7984 Long term (current) use of oral hypoglycemic drugs: Secondary | ICD-10-CM | POA: Diagnosis not present

## 2020-12-02 DIAGNOSIS — K209 Esophagitis, unspecified without bleeding: Secondary | ICD-10-CM | POA: Diagnosis present

## 2020-12-02 DIAGNOSIS — Z87891 Personal history of nicotine dependence: Secondary | ICD-10-CM | POA: Diagnosis not present

## 2020-12-02 DIAGNOSIS — R7402 Elevation of levels of lactic acid dehydrogenase (LDH): Secondary | ICD-10-CM | POA: Diagnosis present

## 2020-12-02 DIAGNOSIS — K7581 Nonalcoholic steatohepatitis (NASH): Secondary | ICD-10-CM | POA: Diagnosis present

## 2020-12-02 DIAGNOSIS — E119 Type 2 diabetes mellitus without complications: Secondary | ICD-10-CM | POA: Diagnosis present

## 2020-12-02 DIAGNOSIS — E876 Hypokalemia: Secondary | ICD-10-CM | POA: Diagnosis present

## 2020-12-02 DIAGNOSIS — K7682 Hepatic encephalopathy: Secondary | ICD-10-CM | POA: Diagnosis present

## 2020-12-02 DIAGNOSIS — K208 Other esophagitis without bleeding: Secondary | ICD-10-CM

## 2020-12-02 DIAGNOSIS — Z20822 Contact with and (suspected) exposure to covid-19: Secondary | ICD-10-CM | POA: Diagnosis present

## 2020-12-02 DIAGNOSIS — R791 Abnormal coagulation profile: Secondary | ICD-10-CM

## 2020-12-02 DIAGNOSIS — K7290 Hepatic failure, unspecified without coma: Secondary | ICD-10-CM | POA: Diagnosis present

## 2020-12-02 DIAGNOSIS — D72829 Elevated white blood cell count, unspecified: Secondary | ICD-10-CM | POA: Diagnosis present

## 2020-12-02 DIAGNOSIS — K7469 Other cirrhosis of liver: Secondary | ICD-10-CM

## 2020-12-02 DIAGNOSIS — E871 Hypo-osmolality and hyponatremia: Secondary | ICD-10-CM | POA: Diagnosis present

## 2020-12-02 DIAGNOSIS — K7030 Alcoholic cirrhosis of liver without ascites: Secondary | ICD-10-CM | POA: Diagnosis present

## 2020-12-02 DIAGNOSIS — K7200 Acute and subacute hepatic failure without coma: Secondary | ICD-10-CM

## 2020-12-02 DIAGNOSIS — E878 Other disorders of electrolyte and fluid balance, not elsewhere classified: Secondary | ICD-10-CM

## 2020-12-02 DIAGNOSIS — Z538 Procedure and treatment not carried out for other reasons: Secondary | ICD-10-CM | POA: Diagnosis present

## 2020-12-02 DIAGNOSIS — R748 Abnormal levels of other serum enzymes: Secondary | ICD-10-CM | POA: Diagnosis present

## 2020-12-02 DIAGNOSIS — Z79899 Other long term (current) drug therapy: Secondary | ICD-10-CM

## 2020-12-02 DIAGNOSIS — R7989 Other specified abnormal findings of blood chemistry: Secondary | ICD-10-CM

## 2020-12-02 LAB — I-STAT VENOUS BLOOD GAS, ED
Acid-base deficit: 15 mmol/L — ABNORMAL HIGH (ref 0.0–2.0)
Bicarbonate: 8.4 mmol/L — ABNORMAL LOW (ref 20.0–28.0)
Calcium, Ion: 1.06 mmol/L — ABNORMAL LOW (ref 1.15–1.40)
HCT: 29 % — ABNORMAL LOW (ref 36.0–46.0)
Hemoglobin: 9.9 g/dL — ABNORMAL LOW (ref 12.0–15.0)
O2 Saturation: 99 %
Potassium: 2.5 mmol/L — CL (ref 3.5–5.1)
Sodium: 127 mmol/L — ABNORMAL LOW (ref 135–145)
TCO2: 9 mmol/L — ABNORMAL LOW (ref 22–32)
pCO2, Ven: 15.2 mmHg — CL (ref 44.0–60.0)
pH, Ven: 7.352 (ref 7.250–7.430)
pO2, Ven: 140 mmHg — ABNORMAL HIGH (ref 32.0–45.0)

## 2020-12-02 LAB — CBC
HCT: 27.8 % — ABNORMAL LOW (ref 36.0–46.0)
Hemoglobin: 10 g/dL — ABNORMAL LOW (ref 12.0–15.0)
MCH: 38.9 pg — ABNORMAL HIGH (ref 26.0–34.0)
MCHC: 36 g/dL (ref 30.0–36.0)
MCV: 108.2 fL — ABNORMAL HIGH (ref 80.0–100.0)
Platelets: 209 10*3/uL (ref 150–400)
RBC: 2.57 MIL/uL — ABNORMAL LOW (ref 3.87–5.11)
RDW: 22.1 % — ABNORMAL HIGH (ref 11.5–15.5)
WBC: 17 10*3/uL — ABNORMAL HIGH (ref 4.0–10.5)
nRBC: 0 % (ref 0.0–0.2)

## 2020-12-02 LAB — COMPREHENSIVE METABOLIC PANEL
ALT: 69 U/L — ABNORMAL HIGH (ref 0–44)
AST: 100 U/L — ABNORMAL HIGH (ref 15–41)
Albumin: 2.7 g/dL — ABNORMAL LOW (ref 3.5–5.0)
Alkaline Phosphatase: 135 U/L — ABNORMAL HIGH (ref 38–126)
Anion gap: 23 — ABNORMAL HIGH (ref 5–15)
BUN: 124 mg/dL — ABNORMAL HIGH (ref 6–20)
CO2: 8 mmol/L — ABNORMAL LOW (ref 22–32)
Calcium: 8.9 mg/dL (ref 8.9–10.3)
Chloride: 93 mmol/L — ABNORMAL LOW (ref 98–111)
Creatinine, Ser: 7.45 mg/dL — ABNORMAL HIGH (ref 0.44–1.00)
GFR, Estimated: 6 mL/min — ABNORMAL LOW (ref >60)
Glucose, Bld: 88 mg/dL (ref 70–99)
Potassium: 3 mmol/L — ABNORMAL LOW (ref 3.5–5.1)
Sodium: 124 mmol/L — ABNORMAL LOW (ref 135–145)
Total Bilirubin: 17.1 mg/dL — ABNORMAL HIGH (ref 0.3–1.2)
Total Protein: 9.3 g/dL — ABNORMAL HIGH (ref 6.5–8.1)

## 2020-12-02 LAB — CBG MONITORING, ED: Glucose-Capillary: 104 mg/dL — ABNORMAL HIGH (ref 70–99)

## 2020-12-02 LAB — RESP PANEL BY RT-PCR (FLU A&B, COVID) ARPGX2
Influenza A by PCR: NEGATIVE
Influenza B by PCR: NEGATIVE
SARS Coronavirus 2 by RT PCR: NEGATIVE

## 2020-12-02 LAB — PROTIME-INR
INR: 4.3 (ref 0.8–1.2)
Prothrombin Time: 41.2 seconds — ABNORMAL HIGH (ref 11.4–15.2)

## 2020-12-02 LAB — LACTIC ACID, PLASMA: Lactic Acid, Venous: 1.9 mmol/L (ref 0.5–1.9)

## 2020-12-02 LAB — AMMONIA: Ammonia: 143 umol/L — ABNORMAL HIGH (ref 9–35)

## 2020-12-02 LAB — MAGNESIUM: Magnesium: 1.8 mg/dL (ref 1.7–2.4)

## 2020-12-02 MED ORDER — ONDANSETRON HCL 4 MG/2ML IJ SOLN
4.0000 mg | Freq: Once | INTRAMUSCULAR | Status: AC
Start: 2020-12-02 — End: 2020-12-02
  Administered 2020-12-02: 4 mg via INTRAVENOUS
  Filled 2020-12-02: qty 2

## 2020-12-02 MED ORDER — PANTOPRAZOLE SODIUM 40 MG IV SOLR
40.0000 mg | Freq: Once | INTRAVENOUS | Status: AC
  Administered 2020-12-02: 40 mg via INTRAVENOUS
  Filled 2020-12-02: qty 40

## 2020-12-02 MED ORDER — SODIUM BICARBONATE 650 MG PO TABS
1300.0000 mg | ORAL_TABLET | Freq: Three times a day (TID) | ORAL | Status: DC
  Administered 2020-12-02 – 2020-12-09 (×18): 1300 mg via ORAL
  Filled 2020-12-02 (×22): qty 2

## 2020-12-02 MED ORDER — SODIUM CHLORIDE 0.9 % IV BOLUS
1000.0000 mL | Freq: Once | INTRAVENOUS | Status: AC
  Administered 2020-12-02: 1000 mL via INTRAVENOUS

## 2020-12-02 MED ORDER — RIFAXIMIN 550 MG PO TABS
550.0000 mg | ORAL_TABLET | Freq: Two times a day (BID) | ORAL | Status: DC
  Administered 2020-12-02 – 2020-12-11 (×17): 550 mg via ORAL
  Filled 2020-12-02 (×18): qty 1

## 2020-12-02 MED ORDER — RIVAROXABAN 20 MG PO TABS
20.0000 mg | ORAL_TABLET | Freq: Every day | ORAL | Status: DC
  Filled 2020-12-02: qty 1

## 2020-12-02 MED ORDER — ONDANSETRON HCL 4 MG PO TABS: 4.0000 mg | ORAL_TABLET | Freq: Four times a day (QID) | ORAL | Status: DC | PRN

## 2020-12-02 MED ORDER — PANTOPRAZOLE SODIUM 40 MG IV SOLR
40.0000 mg | Freq: Two times a day (BID) | INTRAVENOUS | Status: DC
  Administered 2020-12-02 – 2020-12-06 (×8): 40 mg via INTRAVENOUS
  Filled 2020-12-02 (×8): qty 40

## 2020-12-02 MED ORDER — ENSURE ENLIVE PO LIQD
237.0000 mL | Freq: Two times a day (BID) | ORAL | Status: DC
  Administered 2020-12-05 – 2020-12-09 (×7): 237 mL via ORAL
  Filled 2020-12-02: qty 237

## 2020-12-02 MED ORDER — MAGNESIUM SULFATE 2 GM/50ML IV SOLN
2.0000 g | Freq: Once | INTRAVENOUS | Status: AC
  Administered 2020-12-02: 2 g via INTRAVENOUS
  Filled 2020-12-02: qty 50

## 2020-12-02 MED ORDER — LACTULOSE 10 GM/15ML PO SOLN
30.0000 g | Freq: Once | ORAL | Status: AC
  Administered 2020-12-02: 30 g via ORAL
  Filled 2020-12-02: qty 60

## 2020-12-02 MED ORDER — INSULIN ASPART 100 UNIT/ML ~~LOC~~ SOLN
0.0000 [IU] | Freq: Three times a day (TID) | SUBCUTANEOUS | Status: DC
  Administered 2020-12-03 (×2): 1 [IU] via SUBCUTANEOUS
  Administered 2020-12-03 – 2020-12-04 (×3): 2 [IU] via SUBCUTANEOUS
  Administered 2020-12-04: 13:00:00 1 [IU] via SUBCUTANEOUS
  Administered 2020-12-04 – 2020-12-05 (×2): 2 [IU] via SUBCUTANEOUS
  Administered 2020-12-05 (×2): 1 [IU] via SUBCUTANEOUS
  Administered 2020-12-06: 18:00:00 2 [IU] via SUBCUTANEOUS
  Administered 2020-12-06 – 2020-12-08 (×9): 1 [IU] via SUBCUTANEOUS
  Administered 2020-12-08: 17:00:00 2 [IU] via SUBCUTANEOUS
  Administered 2020-12-09: 22:00:00 1 [IU] via SUBCUTANEOUS
  Administered 2020-12-09: 17:00:00 2 [IU] via SUBCUTANEOUS
  Administered 2020-12-09 – 2020-12-10 (×3): 1 [IU] via SUBCUTANEOUS

## 2020-12-02 MED ORDER — MORPHINE SULFATE (PF) 4 MG/ML IV SOLN
4.0000 mg | Freq: Once | INTRAVENOUS | Status: AC
Start: 2020-12-02 — End: 2020-12-02
  Administered 2020-12-02: 4 mg via INTRAVENOUS
  Filled 2020-12-02: qty 1

## 2020-12-02 MED ORDER — MIDODRINE HCL 5 MG PO TABS
5.0000 mg | ORAL_TABLET | Freq: Three times a day (TID) | ORAL | Status: DC
  Administered 2020-12-03 – 2020-12-11 (×21): 5 mg via ORAL
  Filled 2020-12-02 (×24): qty 1

## 2020-12-02 MED ORDER — ALBUMIN HUMAN 25 % IV SOLN
25.0000 g | Freq: Four times a day (QID) | INTRAVENOUS | Status: AC
  Administered 2020-12-03 – 2020-12-04 (×7): 25 g via INTRAVENOUS
  Filled 2020-12-02 (×8): qty 100

## 2020-12-02 MED ORDER — FOLIC ACID 1 MG PO TABS: 1.0000 mg | ORAL_TABLET | Freq: Every day | ORAL | Status: DC

## 2020-12-02 MED ORDER — OXYCODONE HCL 5 MG PO TABS
5.0000 mg | ORAL_TABLET | ORAL | Status: DC | PRN
  Administered 2020-12-02 – 2020-12-09 (×5): 5 mg via ORAL
  Filled 2020-12-02 (×5): qty 1

## 2020-12-02 MED ORDER — POTASSIUM CHLORIDE 10 MEQ/100ML IV SOLN
10.0000 meq | INTRAVENOUS | Status: AC
  Administered 2020-12-02 (×2): 10 meq via INTRAVENOUS
  Filled 2020-12-02 (×2): qty 100

## 2020-12-02 MED ORDER — FOLIC ACID 1 MG PO TABS
1.0000 mg | ORAL_TABLET | Freq: Every day | ORAL | Status: DC
  Administered 2020-12-03 – 2020-12-11 (×8): 1 mg via ORAL
  Filled 2020-12-02 (×9): qty 1

## 2020-12-02 MED ORDER — SODIUM BICARBONATE 8.4 % IV SOLN
INTRAVENOUS | Status: DC
  Filled 2020-12-02 (×4): qty 850

## 2020-12-02 MED ORDER — POTASSIUM CHLORIDE CRYS ER 20 MEQ PO TBCR
40.0000 meq | EXTENDED_RELEASE_TABLET | Freq: Once | ORAL | Status: AC
  Administered 2020-12-02: 40 meq via ORAL
  Filled 2020-12-02: qty 2

## 2020-12-02 MED ORDER — ADULT MULTIVITAMIN W/MINERALS CH
1.0000 | ORAL_TABLET | Freq: Every day | ORAL | Status: DC
  Administered 2020-12-03 – 2020-12-11 (×5): 1 via ORAL
  Filled 2020-12-02 (×8): qty 1

## 2020-12-02 MED ORDER — LACTULOSE 10 GM/15ML PO SOLN
30.0000 g | Freq: Three times a day (TID) | ORAL | Status: DC
  Administered 2020-12-03 – 2020-12-10 (×14): 30 g via ORAL
  Filled 2020-12-02 (×20): qty 45

## 2020-12-02 MED ORDER — OCTREOTIDE ACETATE 100 MCG/ML IJ SOLN
100.0000 ug | Freq: Three times a day (TID) | INTRAMUSCULAR | Status: DC
  Administered 2020-12-02 – 2020-12-07 (×13): 100 ug via SUBCUTANEOUS
  Filled 2020-12-02 (×19): qty 1

## 2020-12-02 MED ORDER — ONDANSETRON HCL 4 MG/2ML IJ SOLN
4.0000 mg | Freq: Four times a day (QID) | INTRAMUSCULAR | Status: DC | PRN
  Administered 2020-12-03: 4 mg via INTRAVENOUS
  Filled 2020-12-02: qty 2

## 2020-12-02 MED ORDER — POTASSIUM CHLORIDE 10 MEQ/100ML IV SOLN
10.0000 meq | INTRAVENOUS | Status: AC
  Administered 2020-12-02 – 2020-12-03 (×2): 10 meq via INTRAVENOUS

## 2020-12-02 NOTE — ED Notes (Signed)
Date and time results received: 12/02/20 1905 (use smartphrase ".now" to insert current time)  Test: INR Critical Value: 4.3  Name of Provider Notified:Henderly PA  Orders Received? Or Actions Taken?:

## 2020-12-02 NOTE — ED Triage Notes (Signed)
Pt reports hx of liver disease, states she had routine labs and was called today and advised she is in kidney failure and needed to come into ED. Endorses lower back pain. States she is still urinating normally. Pt jaundiced.

## 2020-12-02 NOTE — ED Notes (Signed)
Tray ordered.

## 2020-12-02 NOTE — ED Notes (Signed)
Pt given a meal tray 

## 2020-12-02 NOTE — ED Notes (Signed)
Pt is currently eating meal tray. Unable to start second IV until pt is finished with meal. IV potassium had to be paused while eating. IV is in the [facility] and not running with bending of the arm.

## 2020-12-02 NOTE — H&P (Addendum)
History and Physical    Shelby HatchetBethany Conner WUJ:[id number]:[id number] DOB: 12/11/1971 DOA: 12/02/2020  PCP: Pcp, No  Patient coming from: Home   Chief Complaint:  Chief Complaint  Patient presents with  . 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  Of note, patient was [facility] at [facility] from 11/18-11/24, initially presenting with abdominal pain found to have markedly decompensated liver cirrhosis. During that hospitalization, there was clinical concern for SBP and patient received a course of antibiotic therapy. Patient was status post 2.5 L paracentesis. Patient underwent EGD on 11/23 revealing LA grade C esophagitis however this revealed no concurrent varices. Hospital course was also complicated by hepatic encephalopathy. Patient was also found to be suffering from acute kidney injury although this resolved. Concerning etiology of cirrhosis, alpha-1 antitrypsin level was normal/slightly elevated. Mitochondrial antibodies were negative. Ceruloplasmin levels were normal. Actin antibodies were found to be marginally elevated at 21. Patient was discharged on 11/24 to follow-up with [name] with gastroenterology.  Today, patient is a poor historian due to lethargy, waxing and waning throughout the interview likely secondary to hepatic encephalopathy. Husband is at the bedside who is provided portions of the history. Since discharge from the hospital, patient reports that she has continued to feel generalized weakness  "shakiness." Patient reports being compliant with all medications including her spironolactone, Lasix and lactulose. Patient denies any fevers, dysuria, cough, shortness of breath, sick contacts, recent travel or contact with confirmed COVID-19 infection. Patient states that her abdominal pain that she experienced during her last hospitalization has improved although she is still experiencing generalized body pains that she is unable to describe further. Patient does complain of intermittent vomiting but states that she has good appetite overall. Patient denies any alcohol use since her recent discharge.  Husband reports that patient is observably "worse" over the past few days with worsening lethargy and weakness. Patient [facility] emergency department after being advised to come to the emergency department by her primary care provider for abnormal labs. Patient was informed by her primary care provider that her renal function was markedly abnormal.  Upon evaluation in the emergency room patient is found to be suffering from severe acute kidney injury with creatinine of 7.45. Patient is also found to have continued elevated transaminases with AST of 100 and ALT of 69. Patient is also found to have continued markedly elevated bilirubin of 17.1 with slightly elevated alkaline phosphatase of 135. Dr. [name] with nephrology was consulted by the emergency department with already come and seen the patient. Dr. [name] is appropriately concerned for hepatorenal syndrome and has already initiated the patient on treatment. The hospice group is now been called to assess patient for admission to the hospital   Review of Systems:   Review of Systems  Constitutional: Positive for malaise/fatigue.  Gastrointestinal: Positive for diarrhea and vomiting.  Musculoskeletal: Positive for myalgias.  Skin:       Jaundice  All other systems reviewed and are negative.   Past Medical History:   Diagnosis Date  . Diabetes mellitus without complication (  HCC)   . Fatty liver   . Hyperlipidemia   . Protein C deficiency (HCC)   . Protein S deficiency [facility])     Past Surgical History:  Procedure Laterality Date  . ANKLE SURGERY Right   . BIOPSY  11/16/2020   Procedure: BIOPSY;  Surgeon: Kathi Der, MD;  Location: [facility] ENDOSCOPY;  Service: Gastroenterology;;  . CESAREAN SECTION    . ESOPHAGOGASTRODUODENOSCOPY N/A 11/16/2020   Procedure: ESOPHAGOGASTRODUODENOSCOPY (EGD);  Surgeon: Kathi Der, MD;  Location: [facility] ENDOSCOPY;  Service: Gastroenterology;  Laterality: N/A;  . IR PARACENTESIS  11/12/2020  . KNEE SURGERY Left      reports that she has quit smoking. She has never used smokeless tobacco. She reports current alcohol use. She reports that she does not use drugs.  No Known Allergies  Family History  Problem Relation Age of Onset  . Liver disease Neg Hx      Prior to Admission medications   Medication Sig Start Date End Date Taking? Authorizing Provider  ezetimibe (ZETIA) 10 MG tablet Take 10 mg by mouth daily. 11/19/20  Yes [provider]  feeding supplement (ENSURE ENLIVE / ENSURE PLUS) LIQD Take 237 mLs by mouth 3 (three) times daily between meals. 11/17/20  Yes Pahwani, Kasandra Knudsen, MD  folic acid (FOLVITE) 1 MG tablet Take 1 tablet (1 mg total) by mouth daily. 11/17/20 11/17/21 Yes Pahwani, Rinka R, MD  furosemide (LASIX) 20 MG tablet Take 1 tablet (20 mg total) by mouth daily. 11/17/20 11/17/21 Yes Pahwani, Rinka R, MD  metFORMIN (GLUCOPHAGE) 500 MG tablet Take 500 mg by mouth 2 (two) times daily. 06/08/20  Yes [provider]  mirtazapine (REMERON) 15 MG tablet Take 7.5 mg by mouth at bedtime. 11/24/20  Yes [provider]  Multiple Vitamin (MULTIVITAMIN WITH MINERALS) TABS tablet Take 1 tablet by mouth daily. 11/18/20  Yes Pahwani, Rinka R, MD  oxycodone (OXY-IR) 5 MG capsule Take 1 capsule (5 mg total) by mouth every 4 (four)  hours as needed for pain. 11/17/20  Yes Pahwani, Rinka R, MD  pantoprazole (PROTONIX) 40 MG tablet Take 1 tablet (40 mg total) by mouth 2 (two) times daily. 11/17/20  Yes Pahwani, Rinka R, MD  spironolactone (ALDACTONE) 25 MG tablet Take 1 tablet (25 mg total) by mouth daily. 11/17/20 11/17/21 Yes Pahwani, Rinka R, MD  VICTOZA 18 MG/3ML SOPN Inject 1.8 mg into the skin daily. Sliding scale 06/08/20  Yes [provider]  XARELTO 20 MG TABS tablet Take 20 mg by mouth at bedtime. 09/01/20  Yes [provider]  lactulose (CHRONULAC) 10 GM/15ML solution Take 45 mLs (30 g total) by mouth 2 (two) times daily. 11/17/20   Ollen Bowl, MD    Physical Exam: Vitals:   12/02/20 1800 12/02/20 2000 12/02/20 2022 12/02/20 2045  BP: 110/60 101/61 (!) 92/43 (!) 112/92  Pulse: 71 75 71 73  Resp: 16 16 16 16   Temp:   (!) 97.5 F (36.4 C)   TempSrc:   Oral   SpO2: 99% 98% 98% 100%    Constitutional: Lethargic but arousable and oriented x3, no associated distress.   Skin: Marked jaundice of the skin. Notable poor skin turgor. No rashes, no lesions Eyes: Pupils are equally reactive to light.  No evidence of scleral icterus or conjunctival pallor.  ENMT: Extremely dry mucous membranes noted.  Posterior pharynx clear of any exudate or lesions.   Neck: normal, supple, no masses, no thyromegaly.  No evidence of jugular venous distension.  Respiratory: clear to auscultation bilaterally, no wheezing, no crackles. Normal respiratory effort. No accessory muscle use.  Cardiovascular: Regular rate and rhythm, no murmurs / rubs / gallops. No extremity edema. 2+ pedal pulses. No carotid bruits.  Chest:   Nontender without crepitus or deformity.   Back:   Nontender without crepitus or deformity. Abdomen: Abdomen is somewhat protuberant but soft and nontender.  No evidence of intra-abdominal masses.  Positive bowel sounds noted in all quadrants.   Musculoskeletal: Notable diffuse tenderness of all  extremities without evidence of joint deformities. Good ROM, no contractures. Somewhat poor muscle tone.  Neurologic: Patient exhibits substantial asterixis on examination. She is lethargic but arousable but still oriented x3. CN 2-12 grossly intact. Sensation intact.  Patient moving all 4 extremities spontaneously.  Patient is following all commands.  Patient is responsive to verbal stimuli.   Psychiatric: Patient currently exhibits a flat affect with depressed mood. Patient does not seem to currently possess insight as to her current situation.  Labs on Admission: I have personally reviewed following labs and imaging studies -   CBC: Recent Labs  Lab 12/02/20 1444 12/02/20 1832  WBC 17.0*  --   HGB 10.0* 9.9*  HCT 27.8* 29.0*  MCV 108.2*  --   PLT 209  --    Basic Metabolic Panel: Recent Labs  Lab 12/02/20 1444 12/02/20 1832  NA 124* 127*  K 3.0* 2.5*  CL 93*  --   CO2 8*  --   GLUCOSE 88  --   BUN 124*  --   CREATININE 7.45*  --   CALCIUM 8.9  --    GFR: CrCl cannot be calculated (Unknown ideal weight.). Liver Function Tests: Recent Labs  Lab 12/02/20 1444  AST 100*  ALT 69*  ALKPHOS 135*  BILITOT 17.1*  PROT 9.3*  ALBUMIN 2.7*   No results for input(s): LIPASE, AMYLASE in the last 168 hours. Recent Labs  Lab 12/02/20 1820  AMMONIA 143*   Coagulation Profile: Recent Labs  Lab 12/02/20 1743  INR 4.3*   Cardiac Enzymes: No results for input(s): CKTOTAL, CKMB, CKMBINDEX, TROPONINI in the last 168 hours. BNP (last 3 results) No results for input(s): PROBNP in the last 8760 hours. HbA1C: No results for input(s): HGBA1C in the last 72 hours. CBG: No results for input(s): GLUCAP in the last 168 hours. Lipid Profile: No results for input(s): CHOL, HDL, LDLCALC, TRIG, CHOLHDL, LDLDIRECT in the last 72 hours. Thyroid Function Tests: No results for input(s): TSH, T4TOTAL, FREET4, T3FREE, THYROIDAB in the last 72 hours. Anemia Panel: No results for  input(s): VITAMINB12, FOLATE, FERRITIN, TIBC, IRON, RETICCTPCT in the last 72 hours. Urine analysis:    Component Value Date/Time   COLORURINE AMBER (A) 11/15/2020 1447   APPEARANCEUR CLEAR 11/15/2020 1447   LABSPEC 1.027 11/15/2020 1447   PHURINE 5.0 11/15/2020 1447   GLUCOSEU NEGATIVE 11/15/2020 1447   HGBUR SMALL (A) 11/15/2020 1447   BILIRUBINUR MODERATE (A) 11/15/2020 1447   KETONESUR NEGATIVE 11/15/2020 1447   PROTEINUR 30 (A) 11/15/2020 1447   NITRITE NEGATIVE 11/15/2020 1447   LEUKOCYTESUR NEGATIVE 11/15/2020 1447    Radiological Exams on Admission - Personally Reviewed: US Renal  Result Date: 12/02/2020 CLINICAL DATA:  Initial evaluation for acute renal failure. EXAM: RENAL / URINARY TRACT ULTRASOUND COMPLETE COMPARISON:  None. FINDINGS: Right Kidney: Renal measurements: 11.3 x 4.2 x 5.4 cm = volume: 135.2 mL. Renal echogenicity within normal limits. No nephrolithiasis or hydronephrosis. No focal renal mass. Left Kidney: Renal measurements:  12.8 x 5.7 x 4.2 cm = volume: 159.5 mL. Renal echogenicity within normal limits. No nephrolithiasis or hydronephrosis. No focal renal mass. Bladder: Decompressed and not well evaluated. Other: Incidental note made of stones and/or sludge within the gallbladder lumen. IMPRESSION: 1. Normal renal ultrasound. No hydronephrosis or other significant finding. 2. Incidental cholelithiasis and/or gallbladder sludge. Electronically Signed   By: Rise Mu M.D.   On: 12/02/2020 18:38   IR ABDOMEN US LIMITED  Result Date: 12/01/2020 CLINICAL DATA:  Cirrhosis and history of ascites requiring prior paracentesis, most recently on 11/12/2020. EXAM: LIMITED ABDOMEN ULTRASOUND FOR ASCITES TECHNIQUE: Limited ultrasound survey for ascites was performed in all four abdominal quadrants. COMPARISON:  11/17/2020 FINDINGS: Sonographic survey demonstrates no visualized ascites in the peritoneal cavity. IMPRESSION: No ascites identified. Electronically Signed    By: Irish Lack M.D.   On: 12/01/2020 11:31    EKG: Personally reviewed.  Rhythm is normal sinus rhythm with heart rate of 82 bpm.  No dynamic ST segment changes appreciated.  Assessment/Plan Principal Problem:   AKI (acute kidney injury) [facility])   Patient presenting with severe acute kidney injury, directed to come to the emergency department by her primary care provider due to laboratory findings on follow-up appointment  In the setting of severe decompensated cirrhosis, initial concern is whether the patient is going into hepatorenal syndrome.   Signe Colt has graciously already seen the patient is concern for this possibility and has initiated treatment with albumin, midodrine and octreotide.  Patient is suffering from severe associated metabolic acidosis and has already been initiated on sodium bicarbonate infusion as well as oral sodium bicarbonate by nephrology.  Renal ultrasound performed here in the emergency department is unremarkable. Urinalysis is still pending.  After discussion with the patient, she reports that she is compliant with all of her medications including lactulose, spironolactone and Lasix she states that the lactulose is giving her loose stools upwards of 9 times daily. Because of this history, I do believe that severe volume depletion with prerenal injury and possible ATN is also still a very real possibility. If this is the case, patient may very well respond to intravenous fluids and holding of diuretics.  Patient is received 1 L normal saline bolus by the emergency department staff, fluids already initiated by nephrology as mentioned above.  Strict input and output monitor  Monitoring renal function and electrolytes with serial chemistries  Avoiding nephrotoxic agents if at all possible  Active Problems:   Decompensated hepatic cirrhosis (HCC)   Advanced decompensated hepatic cirrhosis diagnosed during recent hospitalization from 11/18-11/24  Patient  now following with Dr. Levora Angel with gastroenterology in the outpatient setting  Patient underwent pretty extensive work-up for etiologies during the last hospitalization.  Patient reports history of Elita Boone in the past. Furthermore, patient reports drinking up to 3 glasses of wine daily in the past  Alpha 1 antitrypsin, antimitochondrial antibodies and ceruloplasmin levels are all unremarkable  Actin antibodies are slightly elevated at 21 which can sometimes be elevated in autoimmune hepatitis or primary biliary cirrhosis -not a very compelling finding although will defer opinion on this to gastroenterology  Overall etiology of cirrhosis is not entirely clear  Patient currently suffering from associated acute hepatic encephalopaty, plan for this noted below.  Will obtain gastroenterology consult in the morning, epic secure chat message has been sent    Acute hepatic encephalopathy   Patient presented with markedly elevated ammonia level of 143 despite compliance with lactulose  Patient is notably lethargic on examination  with positive asterixis  Markedly elevated ammonia may be secondary to electrolyte derangement and acute kidney injury  We will therefore continue scheduled lactulose during this hospitalization but also add rifaximin 550 mg twice daily  Rifaximin can be discontinued by gastroenterology if they feel it is not indicated.  Monitoring for symptomatic improvement as renal injury is being treated  To early to consider alternative measures such as TIPS procedures at this point - family is inquiring specifically about this however I will defer discussion on this to gastroenterology    Metabolic acidosis   Patient exhibiting severe metabolic acidosis on chemistry  VBG revealing adequate respiratory compensation  Severe metabolic acidosis likely secondary to severe acute kidney injury although lactic acid level is currently pending  Metformin is also being  held  Sodium bicarbonate infusion and also bicarbonate already initiated by nephrology as mentioned above  Serial chemistries to monitor response    Hypokalemia   Notable mild hypokalemia, likely secondary to frequent loose stools from lactulose use  Conservative management considering severe acute kidney injury  If hypokalemia worsens will provide small measure doses of potassium    Protein S deficiency (HCC) and  Protein C deficiency (HCC)   Holding home regimen of Xarelto as INR is currently markedly elevated at 4.3    Type 2 diabetes mellitus without complication (HCC)   Accu-Cheks before every meal and nightly with sensitive insulin sliding scale  Holding home regimen of Metformin in this patient with advanced liver disease  Melena  Patient reporting intermittent melena in the past several days despite not taking Pepto-Bismol or iron in the outpatient setting  While hemoglobin is actually higher than it was during the last hospitalization (likely due to hemoconcentration), reports intermittent bouts of melena in the past several days without outpatient use of iron  Placed on Protonix 40 mg IV every 12 hours  It is worth noting that there was no evidence of varices or ulcers during EGD done several weeks ago and therefore repeat is likely not necessary  Holding Xarelto  Epic secure chat GI consultation placed for the morning  Monitoring hemoglobin and hematocrit  Clear liquid diet for now    [facility] grade C esophagitis   Identified via EGD performed by Dr. Levora Angel on 11/23  Placing patient on Protonix 40 mg IV every 12    Coagulopathy (HCC)  Markedly elevated INR of 4.3  Patient reports melena  Holding Xarelto    Code Status:  Full code Family Communication: Husband is at bedside who has been updated on plan of care  Status is: Inpatient  Remains inpatient appropriate because:Altered mental status, IV treatments appropriate due to  intensity of illness or inability to take PO and Inpatient level of care appropriate due to severity of illness   Dispo: The patient is from: Home              Anticipated d/c is to: Home              Anticipated d/c date is: > 3 days              Patient currently is not medically stable to d/c.        Marinda Elk MD [facility] Pager [phone]  If 7PM-7AM, please contact night-coverage [email or web address] Use universal [place] password for that web site. If you do not have the password, please call the hospital operator.  12/02/2020, 10:14 PM

## 2020-12-02 NOTE — ED Provider Notes (Signed)
[facility] Provider Note   CSN: [id number] Arrival date & time: 12/02/20  1320    History Chief Complaint  Patient presents with  . Abnormal Lab    Shelby Conner is a 49 y.o. female with history significant for protein C, S deficiency, cirrhotic liver with ascites, diabetes who presents for evaluation of worsening renal function.  Patient states she has labs done every few days.  Recently diagnosed with cirrhosis.  Likely due to fatty liver, possible alcohol use.  No chronic NSAIDs.  She is on chronic anticoagulation for protein C, S deficiency.  States she has tried to be compliant with her medications at home however she still persistently has emesis.  Emesis NBNB.  She has generalized myalgias.  Has chronic abdominal pain which she states has not changed.  Went to see IR yesterday however had no ascites to drain.  No melena or bright blood per rectum in stools.  She feels overall weak.  Symptoms have not changed since she was discharged from the hospital.  She denies fever, chills, chest pain, shortness of breath, dysuria, hematuria, flank pain.  Denies additional aggravating or alleviating factors. Chronic emesis and diarrhea.   History obtained from patient and past medical records. No  interpreter used.  HPI     Past Medical History:  Diagnosis Date  . Diabetes mellitus without complication (HCC)   . Fatty liver   . Hyperlipidemia   . Protein C deficiency (HCC)   . Protein S deficiency [facility](HCC)     Patient Active Problem List   Diagnosis Date Noted  . Acute kidney injury (AKI) with acute tubular necrosis (ATN) (HCC) 12/02/2020  . Hypokalemia 11/12/2020  . Protein S deficiency (HCC) 11/12/2020  . Protein C deficiency (HCC) 11/12/2020  . Ascites 11/12/2020  . Type 2 diabetes mellitus without complication (HCC) 11/12/2020  . Acute renal failure (HCC) 11/12/2020  . Palliative care by specialist   . Goals of care,  counseling/discussion   . Full code status   . Decompensated hepatic cirrhosis (HCC) 11/11/2020    Past Surgical History:  Procedure Laterality Date  . ANKLE SURGERY Right   . BIOPSY  11/16/2020   Procedure: BIOPSY;  Surgeon: Kathi DerBrahmbhatt, Parag, MD;  Location: [facility] ENDOSCOPY;  Service: Gastroenterology;;  . CESAREAN SECTION    . ESOPHAGOGASTRODUODENOSCOPY N/A 11/16/2020   Procedure: ESOPHAGOGASTRODUODENOSCOPY (EGD);  Surgeon: Kathi DerBrahmbhatt, Parag, MD;  [facility];  Service: Gastroenterology;  Laterality: N/A;  . IR PARACENTESIS  11/12/2020  . KNEE SURGERY Left      OB History    Gravida  7   Para  3   Term  3   Preterm  0   AB  3   Living  3     SAB  3   IAB  0   Ectopic  0   Multiple  0   Live Births              No family history on file.  Social History   Tobacco Use  . Smoking status: Former Games developermoker  . Smokeless tobacco: Never Used  Vaping Use  . Vaping Use: Never used  Substance Use Topics  . Alcohol use: Yes    Comment: occasionally  . Drug use: Never    Home Medications Prior to Admission medications   Medication Sig Start Date End Date Taking? Authorizing Provider  feeding supplement (ENSURE ENLIVE / ENSURE PLUS) LIQD Take 237 mLs by mouth 3 (three) times daily  between meals. 11/17/20   Pahwani, Kasandra Knudsen, MD  folic acid (FOLVITE) 1 MG tablet Take 1 tablet (1 mg total) by mouth daily. 11/17/20 11/17/21  Pahwani, Kasandra Knudsen, MD  furosemide (LASIX) 20 MG tablet Take 1 tablet (20 mg total) by mouth daily. 11/17/20 11/17/21  Pahwani, Kasandra Knudsen, MD  lactulose (CHRONULAC) 10 GM/15ML solution Take 45 mLs (30 g total) by mouth 2 (two) times daily. 11/17/20   Pahwani, Kasandra Knudsen, MD  metFORMIN (GLUCOPHAGE) 500 MG tablet Take 500 mg by mouth 2 (two) times daily. 06/08/20   [provider]  Multiple Vitamin (MULTIVITAMIN WITH MINERALS) TABS tablet Take 1 tablet by mouth daily. 11/18/20   Pahwani, Kasandra Knudsen, MD  oxycodone (OXY-IR) 5 MG capsule Take 1  capsule (5 mg total) by mouth every 4 (four) hours as needed for pain. 11/17/20   Pahwani, Kasandra Knudsen, MD  pantoprazole (PROTONIX) 40 MG tablet Take 1 tablet (40 mg total) by mouth 2 (two) times daily. 11/17/20   Pahwani, Kasandra Knudsen, MD  spironolactone (ALDACTONE) 25 MG tablet Take 1 tablet (25 mg total) by mouth daily. 11/17/20 11/17/21  Pahwani, Kasandra Knudsen, MD  VICTOZA 18 MG/3ML SOPN Inject into the skin as directed. Sliding scale 06/08/20   [provider]  XARELTO 20 MG TABS tablet Take 20 mg by mouth at bedtime. 09/01/20   [provider]    Allergies    Patient has no known allergies.  Review of Systems   Review of Systems  Constitutional: Negative.   HENT: Negative.   Respiratory: Negative.   Cardiovascular: Negative.   Gastrointestinal: Positive for abdominal pain (Chronic), nausea and vomiting. Negative for abdominal distention, anal bleeding, blood in stool, constipation, diarrhea and rectal pain.  Genitourinary: Negative.   Musculoskeletal: Positive for myalgias. Negative for arthralgias, back pain, gait problem, joint swelling, neck pain and neck stiffness.  Neurological: Positive for weakness (Generalized). Negative for dizziness, tremors, seizures, syncope, facial asymmetry, speech difficulty, light-headedness, numbness and headaches.  All other systems reviewed and are negative.   Physical Exam Updated Vital Signs BP 101/61   Pulse 71   Temp (!) 97.5 F (36.4 C) (Oral)   Resp 16   SpO2 98%   Physical Exam Vitals and nursing note reviewed.  Constitutional:      General: She is not in acute distress.    Appearance: She is well-developed and well-nourished. She is ill-appearing. She is not toxic-appearing.  HENT:     Head: Normocephalic and atraumatic.     Nose: Nose normal.  Eyes:     Extraocular Movements: Extraocular movements intact.     Conjunctiva/sclera: Conjunctivae normal.     Pupils: Pupils are equal, round, and reactive to light.     Comments:  Scleral icterus bilaterally  Neck:     Trachea: Trachea and phonation normal.  Cardiovascular:     Rate and Rhythm: Normal rate.     Pulses: Normal pulses and intact distal pulses.          Radial pulses are 2+ on the right side and 2+ on the left side.       Dorsalis pedis pulses are 2+ on the right side and 2+ on the left side.     Heart sounds: Normal heart sounds.  Pulmonary:     Effort: Pulmonary effort is normal. No respiratory distress.     Breath sounds: Normal breath sounds and air entry.     Comments: Speaks in full sentences without difficulty.  Abdominal:  General: Bowel sounds are normal. There is no distension.     Palpations: Abdomen is soft. There is no fluid wave.     Tenderness: There is no right CVA tenderness or left CVA tenderness.     Hernia: No hernia is present.     Comments: Soft, non tender. No gross fluid wave. Mild distended  Musculoskeletal:        General: Normal range of motion.     Cervical back: Normal range of motion.     Comments: Moves all 4 extremities without difficulty. No bony tenderness  Lymphadenopathy:     Cervical: No cervical adenopathy.  Skin:    General: Skin is warm and dry.     Capillary Refill: Capillary refill takes 2 to 3 seconds.     Coloration: Skin is jaundiced.     Comments: Significant jaundice  Neurological:     Mental Status: She is alert.     Comments: Cn 2-12 grossly intact No asterixis Ambulatory without difficulty  Psychiatric:        Mood and Affect: Mood and affect normal.     Comments: Tearful     ED Results / Procedures / Treatments   Labs (all labs ordered are listed, but only abnormal results are displayed) Labs Reviewed  COMPREHENSIVE METABOLIC PANEL - Abnormal; Notable for the following components:      Result Value   Sodium 124 (*)    Potassium 3.0 (*)    Chloride 93 (*)    CO2 8 (*)    BUN 124 (*)    Creatinine, Ser 7.45 (*)    Total Protein 9.3 (*)    Albumin 2.7 (*)    AST 100 (*)     ALT 69 (*)    Alkaline Phosphatase 135 (*)    Total Bilirubin 17.1 (*)    GFR, Estimated 6 (*)    Anion gap 23 (*)    All other components within normal limits  CBC - Abnormal; Notable for the following components:   WBC 17.0 (*)    RBC 2.57 (*)    Hemoglobin 10.0 (*)    HCT 27.8 (*)    MCV 108.2 (*)    MCH 38.9 (*)    RDW 22.1 (*)    All other components within normal limits  PROTIME-INR - Abnormal; Notable for the following components:   Prothrombin Time 41.2 (*)    INR 4.3 (*)    All other components within normal limits  AMMONIA - Abnormal; Notable for the following components:   Ammonia 143 (*)    All other components within normal limits  I-STAT VENOUS BLOOD GAS, ED - Abnormal; Notable for the following components:   pCO2, Ven 15.2 (*)    pO2, Ven 140.0 (*)    Bicarbonate 8.4 (*)    TCO2 9 (*)    Acid-base deficit 15.0 (*)    Sodium 127 (*)    Potassium 2.5 (*)    Calcium, Ion 1.06 (*)    HCT 29.0 (*)    Hemoglobin 9.9 (*)    All other components within normal limits  RESP PANEL BY RT-PCR (FLU A&B, COVID) ARPGX2  CULTURE, BLOOD (ROUTINE X 2)  CULTURE, BLOOD (ROUTINE X 2)  URINALYSIS, ROUTINE W REFLEX MICROSCOPIC  SODIUM, URINE, RANDOM  MAGNESIUM  LACTIC ACID, PLASMA  LACTIC ACID, PLASMA    EKG EKG Interpretation  Date/Time:  Thursday December 02 2020 19:18:32 EST Ventricular Rate:  76 PR Interval:    QRS  Duration: 121 QT Interval:  487 QTC Calculation: 548 R Axis:   76 Text Interpretation: Sinus rhythm Nonspecific intraventricular conduction delay Probable inferior infarct, old No significant change since last tracing Confirmed by Susy Frizzle [phone]) on 12/02/2020 7:54:18 PM   Radiology US Renal  Result Date: 12/02/2020 CLINICAL DATA:  Initial evaluation for acute renal failure. EXAM: RENAL / URINARY TRACT ULTRASOUND COMPLETE COMPARISON:  None. FINDINGS: Right Kidney: Renal measurements: 11.3 x 4.2 x 5.4 cm = volume: 135.2 mL. Renal echogenicity  within normal limits. No nephrolithiasis or hydronephrosis. No focal renal mass. Left Kidney: Renal measurements: 12.8 x 5.7 x 4.2 cm = volume: 159.5 mL. Renal echogenicity within normal limits. No nephrolithiasis or hydronephrosis. No focal renal mass. Bladder: Decompressed and not well evaluated. Other: Incidental note made of stones and/or sludge within the gallbladder lumen. IMPRESSION: 1. Normal renal ultrasound. No hydronephrosis or other significant finding. 2. Incidental cholelithiasis and/or gallbladder sludge. Electronically Signed   By: Rise Mu M.D.   On: 12/02/2020 18:38   IR ABDOMEN US LIMITED  Result Date: 12/01/2020 CLINICAL DATA:  Cirrhosis and history of ascites requiring prior paracentesis, most recently on 11/12/2020. EXAM: LIMITED ABDOMEN ULTRASOUND FOR ASCITES TECHNIQUE: Limited ultrasound survey for ascites was performed in all four abdominal quadrants. COMPARISON:  11/17/2020 FINDINGS: Sonographic survey demonstrates no visualized ascites in the peritoneal cavity. IMPRESSION: No ascites identified. Electronically Signed   By: Irish Lack M.D.   On: 12/01/2020 11:31     EGD on 11/16/20 without esophageal varices. Does have esophagitis.    Procedures .Critical Care Performed by: Linwood Dibbles, PA-C Authorized by: Linwood Dibbles, PA-C   Critical care provider statement:    Critical care time (minutes):  45   Critical care was necessary to treat or prevent imminent or life-threatening deterioration of the following conditions:  Hepatic failure, metabolic crisis and renal failure   Critical care was time spent personally by me on the following activities:  Discussions with consultants, evaluation of patient's response to treatment, examination of patient, ordering and performing treatments and interventions, ordering and review of laboratory studies, ordering and review of radiographic studies, pulse oximetry, re-evaluation of patient's condition,  obtaining history from patient or surrogate and review of old charts   (including critical care time)  Medications Ordered in ED Medications  sodium bicarbonate 150 mEq in dextrose 5 % 1,000 mL infusion (has no administration in time range)  sodium bicarbonate tablet 1,300 mg (has no administration in time range)  magnesium sulfate IVPB 2 g 50 mL (has no administration in time range)  albumin human 25 % solution 25 g (has no administration in time range)  midodrine (PROAMATINE) tablet 5 mg (has no administration in time range)  octreotide (SANDOSTATIN) injection 100 mcg (has no administration in time range)  sodium chloride 0.9 % bolus 1,000 mL (1,000 mLs Intravenous New Bag/Given 12/02/20 1818)  ondansetron (ZOFRAN) injection 4 mg (4 mg Intravenous Given 12/02/20 1809)  morphine 4 MG/ML injection 4 mg (4 mg Intravenous Given 12/02/20 1817)  pantoprazole (PROTONIX) injection 40 mg (40 mg Intravenous Given 12/02/20 1817)  potassium chloride 10 mEq in 100 mL IVPB (10 mEq Intravenous New Bag/Given 12/02/20 1922)  potassium chloride SA (KLOR-CON) CR tablet 40 mEq (40 mEq Oral Given 12/02/20 1923)  lactulose (CHRONULAC) 10 GM/15ML solution 30 g (30 g Oral Given 12/02/20 2031)   ED Course  I have reviewed the triage vital signs and the nursing notes.  Pertinent labs & imaging results that were  available during my care of the patient were reviewed by me and considered in my medical decision making (see chart for details).  49 year old presents for evaluation of acute renal failure. Afebrile, non septic however chronically ill appearing. Recently diagnosed decompensated cirrhosis.  Discharged few weeks ago.  Labs today with AKI with creatinine 7.45.  No urinary complaints.  Abdomen soft, nontender.  Minimally distended abdomen with no new abd pain. Was seen yesterday by IR for possible paracentesis however no fluid on ultrasound.  Patient with deep jaundice on exam. Normal mentation. Work-up started from  triage.  Labs and imaging personally reviewed and interpreted:  CBC with leukocytosis at 17.0, up from prior at 12, hemoglobin 10.0 Metabolic panel with hyponatremia at 124, potassium 3.0, CO2 8, BUN 124, creatinine 7.45, albumin 2.7, elevation in LFTs, anion gap 23. VBG with normal Ph however potassium on VBG at 2.5, bicarb 8.4 US Renal without acute findings INR 4.3 EKG without ischemia changes, Qtc 548, will add on mag level as well as replace.  CONSULT with Dr. Signe Colt with Nephrology. Recommends Bicarb drip with isotonic IVF at 75/hr and PO bicarb at 1300 TID. Will follow.  Concern for hepatorenal syndrome.  Low suspicion for SBP, spesis at this time  Patient critically ill with multiple electrolyte abnormalities, renal failure as well as decompensated liver failure.  Will admit to hospitalist for further management.  Patient started on bicarb drip as well as IV fluids for hyponatremia, IV and oral potassium for hypokalemia.  Noted elevated ammonia at 143, patient without any altered mental status on exam.  We will give her home dose of lactulose here. INR here at 4.3.  CONSULT with Dr. Leafy Half with [facility] who will evaluate patient for admission.  The patient appears reasonably stabilized for admission considering the current resources, flow, and capabilities available in the ED at this time, and I doubt any other [facility] requiring further screening and/or treatment in the ED prior to admission.    MDM Rules/Calculators/A&P                           Final Clinical Impression(s) / ED Diagnoses Final diagnoses:  Decompensated liver disease (HCC)  Acute renal failure, unspecified acute renal failure type (HCC)  Elevated INR  Hyponatremia  Hypokalemia  Low bicarbonate  Acidosis  Increased ammonia level    Rx / DC Orders ED Discharge Orders    None       Ellarae Nevitt A, PA-C 12/02/20 2035    Pollyann Savoy, MD 12/02/20 2101

## 2020-12-02 NOTE — Consult Note (Signed)
[facility]  HISTORY AND PHYSICAL  Shelby Conner is an 49 y.o. female.    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    Pt was admitted 11/11/20-11/17/20 to [facility] for abd pain, found to have decompensated hepatic cirrhosis.  Tbili 17.  Thought to have possible SBP.  Tried to get a tap, not enough ascites and so was empirically treated. Given lactulose, started on Lasix and aldactone near discharge.  Had a mild elevation in creatinine to 1.39 thought to be the result of a contrasted scan.  Left with a Cr of 0.9, Cr had been 0.46 in 2010 (likely low muscle mass vs hyperfiltration).    She now returns on the advice of her PCP for evaluation of abnormal labs.  She went yesterday to her PCP and was informed that her kidney function was abnormal and was advised to come to the ED.    Once here, she was noted to have Na 124, K 3.0, Cl 93, CO2 8, BUN 124, Cr 7.45 Ca 8.9, Tbili 17.1, AST 100, ALT 69.  WBC ct 17.0 Hgb 10.0, plts 209, INR 4.3. Ammonia 143.  In this setting we are asked to see.     PMH: Past Medical History:  Diagnosis Date  . Diabetes mellitus without complication (HCC)   . Fatty liver   . Hyperlipidemia   . Protein C deficiency (HCC)   . Protein S deficiency (HCC)    PSH: Past Surgical History:  Procedure Laterality Date  . ANKLE SURGERY Right   . BIOPSY  11/16/2020   Procedure: BIOPSY;  Surgeon: Kathi Der, MD;  Location: [facility] ENDOSCOPY;  Service: Gastroenterology;;  . CESAREAN SECTION    . ESOPHAGOGASTRODUODENOSCOPY N/A 11/16/2020   Procedure: ESOPHAGOGASTRODUODENOSCOPY (EGD);  Surgeon: Kathi Der, MD;  Location: [facility] ENDOSCOPY;  Service: Gastroenterology;  Laterality: N/A;  . IR PARACENTESIS  11/12/2020  . KNEE SURGERY Left     Past  Medical History:  Diagnosis Date  . Diabetes mellitus without complication (HCC)   . Fatty liver   . Hyperlipidemia   . Protein C deficiency (HCC)   . Protein S deficiency (HCC)     Medications: Folic acid 1 mg daily Furosemide 20 mg daily Lactulose 30 g BID Oxycodone 5 mg prn Pantoprazole 40 mg BID Spironolactone 25 mg daily Metformin 500 mg BID Xarelto 20 mg daily Victoza  ALLERGIES:  No Known Allergies  FAM HX: No family history on file.  Social History:   reports that she has quit smoking. She has never used smokeless tobacco. She reports current alcohol use. She reports that she does not use drugs.  ROS: ROS: all other systems reviewed and are negative except as per HPI  Blood pressure 101/61, pulse 71, temperature (!) 97.5 F (36.4 C), temperature source Oral, resp. rate 16, SpO2 98 %, unknown if currently breastfeeding. PHYSICAL EXAM: Physical Exam  GEN sitting up in bed, appears uncomfortable HEENT EOMI PERRL ++ scleral icterus NECK no JVD PULM clear CV RRR soft systolic murmur ABD distended, some fluid, overall diffusely tender EXT 1+ LLE edema, no RLE edema NEURO AAO x 3, occasional asterixis SKIN ++ deep jaundice    Results for orders placed or performed during the hospital encounter of 12/02/20 (from the past 48  hour(s))  Comprehensive metabolic panel     Status: Abnormal   Collection Time: 12/02/20  2:44 PM  Result Value Ref Range   Sodium 124 (L) 135 - 145 mmol/L   Potassium 3.0 (L) 3.5 - 5.1 mmol/L   Chloride 93 (L) 98 - 111 mmol/L   CO2 8 (L) 22 - 32 mmol/L   Glucose, Bld 88 70 - 99 mg/dL    Comment: Glucose reference range applies only to samples taken after fasting for at least 8 hours.   BUN 124 (H) 6 - 20 mg/dL   Creatinine, Ser 1.74 (H) 0.44 - 1.00 mg/dL   Calcium 8.9 8.9 - 08.1 mg/dL   Total Protein 9.3 (H) 6.5 - 8.1 g/dL   Albumin 2.7 (L) 3.5 - 5.0 g/dL   AST 448 (H) 15 - 41 U/L   ALT 69 (H) 0 - 44 U/L   Alkaline Phosphatase 135  (H) 38 - 126 U/L   Total Bilirubin 17.1 (H) 0.3 - 1.2 mg/dL    Comment: ICTERIC SPECIMEN   GFR, Estimated 6 (L) >60 mL/min    Comment: (NOTE) Calculated using the CKD-EPI Creatinine Equation (2021)    Anion gap 23 (H) 5 - 15    Comment: Performed at [facility], [address] [place], [place] [address]  CBC     Status: Abnormal   Collection Time: 12/02/20  2:44 PM  Result Value Ref Range   WBC 17.0 (H) 4.0 - 10.5 K/uL   RBC 2.57 (L) 3.87 - 5.11 MIL/uL   Hemoglobin 10.0 (L) 12.0 - 15.0 g/dL   HCT 14.9 (L) 70.2 - 63.7 %   MCV 108.2 (H) 80.0 - 100.0 fL   MCH 38.9 (H) 26.0 - 34.0 pg   MCHC 36.0 30.0 - 36.0 g/dL   RDW 85.8 (H) 85.0 - 27.7 %   Platelets 209 150 - 400 K/uL   nRBC 0.0 0.0 - 0.2 %    Comment: Performed at [facility], [address], [place], [place] [address]  Protime-INR     Status: Abnormal   Collection Time: 12/02/20  5:43 PM  Result Value Ref Range   Prothrombin Time 41.2 (H) 11.4 - 15.2 seconds   INR 4.3 (HH) 0.8 - 1.2    Comment: REPEATED TO VERIFY CRITICAL RESULT CALLED TO, READ BACK BY AND VERIFIED WITHAdaline Sill RN [phone] K FORSYTH (NOTE) INR goal varies based on device and disease states. Performed at [facility], [address] [place], [place] [address]   Ammonia     Status: Abnormal   Collection Time: 12/02/20  6:20 PM  Result Value Ref Range   Ammonia 143 (H) 9 - 35 umol/L    Comment: Performed at [facility], [address], [place], [place] [address]  I-Stat venous blood gas, [facility] ED)     Status: Abnormal   Collection Time: 12/02/20  6:32 PM  Result Value Ref Range   pH, Ven 7.352 7.250 - 7.430   pCO2, Ven 15.2 (LL) 44.0 - 60.0 mmHg   pO2, Ven 140.0 (H) 32.0 - 45.0 mmHg   Bicarbonate 8.4 (L) 20.0 - 28.0 mmol/L   TCO2 9 (L) 22 - 32 mmol/L   O2 Saturation 99.0 %   Acid-base deficit 15.0 (H) 0.0 - 2.0 mmol/L   Sodium 127 (L) 135 - 145 mmol/L   Potassium 2.5 (LL) 3.5 - 5.1 mmol/L   Calcium, Ion 1.06 (L) 1.15 -  1.40 mmol/L   HCT 29.0 (L) 36.0 - 46.0 %   Hemoglobin 9.9 (L) 12.0 - 15.0 g/dL   Sample type VENOUS    Comment NOTIFIED PHYSICIAN     US Renal  Result Date: 12/02/2020 CLINICAL DATA:  Initial evaluation for acute renal failure. EXAM: RENAL / URINARY TRACT ULTRASOUND COMPLETE COMPARISON:  None. FINDINGS: Right Kidney: Renal measurements: 11.3 x 4.2 x 5.4 cm = volume: 135.2 mL. Renal echogenicity within normal limits. No nephrolithiasis or hydronephrosis. No focal renal mass. Left Kidney: Renal measurements: 12.8 x 5.7 x 4.2 cm = volume: 159.5 mL. Renal echogenicity within normal limits. No nephrolithiasis or hydronephrosis. No focal renal mass. Bladder: Decompressed and not well evaluated. Other: Incidental note made of stones and/or sludge within the gallbladder lumen. IMPRESSION: 1. Normal renal ultrasound. No hydronephrosis or other significant finding. 2. Incidental cholelithiasis and/or gallbladder sludge. Electronically Signed   By: Rise Mu M.D.   On: 12/02/2020 18:38   IR ABDOMEN US LIMITED  Result Date: 12/01/2020 CLINICAL DATA:  Cirrhosis and history of ascites requiring prior paracentesis, most recently on 11/12/2020. EXAM: LIMITED ABDOMEN ULTRASOUND FOR ASCITES TECHNIQUE: Limited ultrasound survey for ascites was performed in all four abdominal quadrants. COMPARISON:  11/17/2020 FINDINGS: Sonographic survey demonstrates no visualized ascites in the peritoneal cavity. IMPRESSION: No ascites identified. Electronically Signed   By: Irish Lack M.D.   On: 12/01/2020 11:31    Assessment/Plan  1.  Acute kidney injury: occurring relatively quickly--> Cr 0.9 11/17/20 --> 7.45 today.  Lasix and aldactone new since last hospitalization.  No NSAIDs.  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  Start albumin, midodrine, octreotide.  Hopefully she will turn around but if she doesn't and if she is not a transplant candidate dialysis not likely to  provide much benefit.  Renal US 12/02/20 without obstruction.  2.  Decompensated hepatic cirrhosis: Tbili 17.1.  Seen by GI last admission, referred to liver transplant 11/29/20 but not has been seen yet.  Hold lasix and spironolactone.  On lactulose.  Anticipate GI consult.  ? If she is a transplant candidate--> will defer to GI.  3.  Metabolic acidosis: lactate pending.  PO and IV bicarb--> K being aggressively repleted before initiation.  Notably she was on metformin at home, hold.  4.  Hypokalemia: PO and IV repletion  5.  Leukocytosis: WBC ct 17.1 with abd pain, blood cultures x 2 ordered, perhaps needs repeat tap/ course of CTX.  6.  Hyponatremia: Na 124, anticipate improvement with treatment of HRS  7.  Protein C and S deficiency: was on Xarelto, INR supratherapeutic, would hold for now    8.  Dispo: to be admitted  Bufford Buttner 12/02/2020, 8:26 PM

## 2020-12-02 NOTE — ED Notes (Signed)
Potassium restarted at this time.

## 2020-12-03 LAB — PROTIME-INR
INR: 4.4 (ref 0.8–1.2)
Prothrombin Time: 40.4 seconds — ABNORMAL HIGH (ref 11.4–15.2)

## 2020-12-03 LAB — BASIC METABOLIC PANEL
Anion gap: 21 — ABNORMAL HIGH (ref 5–15)
Anion gap: 23 — ABNORMAL HIGH (ref 5–15)
BUN: 120 mg/dL — ABNORMAL HIGH (ref 6–20)
BUN: 113 mg/dL — ABNORMAL HIGH (ref 6–20)
CO2: 10 mmol/L — ABNORMAL LOW (ref 22–32)
CO2: 9 mmol/L — ABNORMAL LOW (ref 22–32)
Calcium: 8 mg/dL — ABNORMAL LOW (ref 8.9–10.3)
Calcium: 8.9 mg/dL (ref 8.9–10.3)
Chloride: 95 mmol/L — ABNORMAL LOW (ref 98–111)
Chloride: 92 mmol/L — ABNORMAL LOW (ref 98–111)
Creatinine, Ser: 6.84 mg/dL — ABNORMAL HIGH (ref 0.44–1.00)
Creatinine, Ser: 6.78 mg/dL — ABNORMAL HIGH (ref 0.44–1.00)
GFR, Estimated: 7 mL/min — ABNORMAL LOW (ref >60)
GFR, Estimated: 7 mL/min — ABNORMAL LOW (ref >60)
Glucose, Bld: 132 mg/dL — ABNORMAL HIGH (ref 70–99)
Glucose, Bld: 155 mg/dL — ABNORMAL HIGH (ref 70–99)
Potassium: 3.5 mmol/L (ref 3.5–5.1)
Potassium: 2.8 mmol/L — ABNORMAL LOW (ref 3.5–5.1)
Sodium: 126 mmol/L — ABNORMAL LOW (ref 135–145)
Sodium: 124 mmol/L — ABNORMAL LOW (ref 135–145)

## 2020-12-03 LAB — URINALYSIS, ROUTINE W REFLEX MICROSCOPIC
Glucose, UA: NEGATIVE mg/dL
Ketones, ur: NEGATIVE mg/dL
Nitrite: NEGATIVE
Protein, ur: 100 mg/dL — AB
Specific Gravity, Urine: 1.016 (ref 1.005–1.030)
Squamous Epithelial / HPF: >50 — ABNORMAL HIGH (ref 0–5)
pH: 5 (ref 5.0–8.0)

## 2020-12-03 LAB — CBC WITH DIFFERENTIAL/PLATELET
Abs Immature Granulocytes: 0.1 10*3/uL — ABNORMAL HIGH (ref 0.00–0.07)
Basophils Absolute: 0.1 10*3/uL (ref 0.0–0.1)
Basophils Relative: 1 %
Eosinophils Absolute: 0.2 10*3/uL (ref 0.0–0.5)
Eosinophils Relative: 2 %
HCT: 21.9 % — ABNORMAL LOW (ref 36.0–46.0)
Hemoglobin: 7.6 g/dL — ABNORMAL LOW (ref 12.0–15.0)
Immature Granulocytes: 1 %
Lymphocytes Relative: 15 %
Lymphs Abs: 1.4 10*3/uL (ref 0.7–4.0)
MCH: 37.1 pg — ABNORMAL HIGH (ref 26.0–34.0)
MCHC: 34.7 g/dL (ref 30.0–36.0)
MCV: 106.8 fL — ABNORMAL HIGH (ref 80.0–100.0)
Monocytes Absolute: 0.3 10*3/uL (ref 0.1–1.0)
Monocytes Relative: 3 %
Neutro Abs: 7.4 10*3/uL (ref 1.7–7.7)
Neutrophils Relative %: 78 %
Platelets: 155 10*3/uL (ref 150–400)
RBC: 2.05 MIL/uL — ABNORMAL LOW (ref 3.87–5.11)
RDW: 21.9 % — ABNORMAL HIGH (ref 11.5–15.5)
WBC: 9.5 10*3/uL (ref 4.0–10.5)
nRBC: 0 % (ref 0.0–0.2)

## 2020-12-03 LAB — CBG MONITORING, ED
Glucose-Capillary: 193 mg/dL — ABNORMAL HIGH (ref 70–99)
Glucose-Capillary: 172 mg/dL — ABNORMAL HIGH (ref 70–99)
Glucose-Capillary: 134 mg/dL — ABNORMAL HIGH (ref 70–99)

## 2020-12-03 LAB — MAGNESIUM: Magnesium: 2.3 mg/dL (ref 1.7–2.4)

## 2020-12-03 LAB — TSH: TSH: 2.739 u[IU]/mL (ref 0.350–4.500)

## 2020-12-03 LAB — GLUCOSE, CAPILLARY: Glucose-Capillary: 147 mg/dL — ABNORMAL HIGH (ref 70–99)

## 2020-12-03 LAB — PHOSPHORUS: Phosphorus: 6.6 mg/dL — ABNORMAL HIGH (ref 2.5–4.6)

## 2020-12-03 LAB — HEPATIC FUNCTION PANEL
ALT: 49 U/L — ABNORMAL HIGH (ref 0–44)
AST: 82 U/L — ABNORMAL HIGH (ref 15–41)
Albumin: 2.5 g/dL — ABNORMAL LOW (ref 3.5–5.0)
Alkaline Phosphatase: 100 U/L (ref 38–126)
Bilirubin, Direct: 6.2 mg/dL — ABNORMAL HIGH (ref 0.0–0.2)
Indirect Bilirubin: 5.7 mg/dL — ABNORMAL HIGH (ref 0.3–0.9)
Total Bilirubin: 11.9 mg/dL — ABNORMAL HIGH (ref 0.3–1.2)
Total Protein: 7.2 g/dL (ref 6.5–8.1)

## 2020-12-03 LAB — SODIUM, URINE, RANDOM: Sodium, Ur: <10 mmol/L

## 2020-12-03 LAB — APTT: aPTT: 59 seconds — ABNORMAL HIGH (ref 24–36)

## 2020-12-03 LAB — AMMONIA: Ammonia: 83 umol/L — ABNORMAL HIGH (ref 9–35)

## 2020-12-03 MED ORDER — LACTULOSE ENEMA
300.0000 mL | Freq: Once | ORAL | Status: AC
  Administered 2020-12-04: 300 mL via RECTAL
  Filled 2020-12-03: qty 300

## 2020-12-03 MED ORDER — SODIUM CHLORIDE 0.9 % IV BOLUS
500.0000 mL | Freq: Once | INTRAVENOUS | Status: AC
  Administered 2020-12-03: 500 mL via INTRAVENOUS

## 2020-12-03 MED ORDER — HYDROXYZINE HCL 10 MG PO TABS
10.0000 mg | ORAL_TABLET | Freq: Three times a day (TID) | ORAL | Status: DC | PRN
  Administered 2020-12-03: 10 mg via ORAL
  Filled 2020-12-03: qty 1

## 2020-12-03 NOTE — ED Notes (Signed)
Dinner Tray Ordered @ 1744. 

## 2020-12-03 NOTE — Consult Note (Signed)
Referring Provider: EDP Primary Care Physician:  Pcp, No Primary Gastroenterologist:  Dr. Levora Angel  Reason for Consultation: Decompensated cirrhosis, acute kidney injury, encephalopathy  HPI: Shelby Conner is a 49 y.o. female known to our service from recent hospitalization admitted to the hospital for abnormal blood work.She was admitted to the hospital on November 11, 2020 with decompensated alcoholic cirrhosis, ascites and anemia. Was discharged on November 17, 2020. Please see GI workup for details.  She was seen in the office on November 25, 2020. Blood work done in November 24, 2020 by primary care physician showed  total bilirubin of 19, creatinine of 1.72, potassium of 2.9, WBC count of 15.9 and vitamin-D level of 12.2.   Patient was advised to hold off on diuretics and repeat blood work was recommended. Patient declined repeat blood work at that time stating  that repeat blood work is scheduled by primary care physician in few days. Looks like patient had repeat blood work done with primary care physician yesterday which showed worsening kidney functions and worsening leukocytosis and she was advised to go to emergency dept for further management.  Upon initial evaluation here, she was found to have creatinine of 7.45, T bili of 17.1, WBC count of 17, INR of 4.3. Hemoglobin is 10. GI is consulted for further evaluation and management.  Patient seen and examined at bedside in the emergency room.  She is somewhat alert but not able to give appropriate history.  Patient's mother-in-law was at bedside.  Also discussed with patient's husband over the phone.  Patient was having intermittent confusion.  Decreased urination.  Last urine output was yesterday.  According to husband, patient was taking some Advil PM for sleep.  Patient is not sure if she was taking diuretics or not.  But according to husband she has not taken diuretics since office visit with me.  Denies any blood in the stool  or black stool.    Previous GI work-up            CT abdomen pelvis without contrast on November 14, 2020 showed cirrhosis of the liver with stigmata of portal hypertension such as splenomegaly and ascites.            Underwent paracenteses on November 12, 2020 with removal of 2.5 L of fluid. Fluid analysis was negative. Do not able to do repeat paracenteses on November 24 because of lack of enough fluids.            EGD on November 16, 2020 showed LA grade C esophagitis and gastritis. Large amount of retained food in the stomach limiting visualization. There was no evidence of bleeding. She was advised to stay on pantoprazole 40 mg mg twice a day. Biopsies were negative for H pylori.            Blood work on discharge on November 17, 2020 showed WBC of 12.7, hemoglobin of 8.7, platelet counts not calculated because of clumps, INR 2.2, PT 23.9, petition 3.2, total bilirubin 17.4, AST 104. Normal ALT and alkaline phosphatase. Low folic acid at 3.1. Normal vitamin B12. Elevated ferritin 1167. Normal AFP. Mildly elevated alpha 1 antitrypsin level at 201, ASMA of 21, normal antimitochondrial antibody, normal ceruloplasmin, negative Hepatitis panel.  Past Medical History:  Diagnosis Date  . Diabetes mellitus without complication (HCC)   . Fatty liver   . Hyperlipidemia   . Protein C deficiency (HCC)   . Protein S deficiency [facility])     Past Surgical History:  Procedure Laterality  Date  . ANKLE SURGERY Right   . BIOPSY  11/16/2020   Procedure: BIOPSY;  Surgeon: Kathi Der, MD;  Location: [facility] ENDOSCOPY;  Service: Gastroenterology;;  . CESAREAN SECTION    . ESOPHAGOGASTRODUODENOSCOPY N/A 11/16/2020   Procedure: ESOPHAGOGASTRODUODENOSCOPY (EGD);  Surgeon: Kathi Der, MD;  Location: [facility] ENDOSCOPY;  Service: Gastroenterology;  Laterality: N/A;  . IR PARACENTESIS  11/12/2020  . KNEE SURGERY Left     Prior to Admission medications   Medication Sig Start Date End Date Taking? Authorizing  Provider  ezetimibe (ZETIA) 10 MG tablet Take 10 mg by mouth daily. 11/19/20  Yes [provider]  feeding supplement (ENSURE ENLIVE / ENSURE PLUS) LIQD Take 237 mLs by mouth 3 (three) times daily between meals. 11/17/20  Yes Pahwani, Kasandra Knudsen, MD  folic acid (FOLVITE) 1 MG tablet Take 1 tablet (1 mg total) by mouth daily. 11/17/20 11/17/21 Yes Pahwani, Rinka R, MD  furosemide (LASIX) 20 MG tablet Take 1 tablet (20 mg total) by mouth daily. 11/17/20 11/17/21 Yes Pahwani, Rinka R, MD  metFORMIN (GLUCOPHAGE) 500 MG tablet Take 500 mg by mouth 2 (two) times daily. 06/08/20  Yes [provider]  mirtazapine (REMERON) 15 MG tablet Take 7.5 mg by mouth at bedtime. 11/24/20  Yes [provider]  Multiple Vitamin (MULTIVITAMIN WITH MINERALS) TABS tablet Take 1 tablet by mouth daily. 11/18/20  Yes Pahwani, Rinka R, MD  oxycodone (OXY-IR) 5 MG capsule Take 1 capsule (5 mg total) by mouth every 4 (four) hours as needed for pain. 11/17/20  Yes Pahwani, Rinka R, MD  pantoprazole (PROTONIX) 40 MG tablet Take 1 tablet (40 mg total) by mouth 2 (two) times daily. 11/17/20  Yes Pahwani, Rinka R, MD  spironolactone (ALDACTONE) 25 MG tablet Take 1 tablet (25 mg total) by mouth daily. 11/17/20 11/17/21 Yes Pahwani, Rinka R, MD  VICTOZA 18 MG/3ML SOPN Inject 1.8 mg into the skin daily. Sliding scale 06/08/20  Yes [provider]  XARELTO 20 MG TABS tablet Take 20 mg by mouth at bedtime. 09/01/20  Yes [provider]  lactulose (CHRONULAC) 10 GM/15ML solution Take 45 mLs (30 g total) by mouth 2 (two) times daily. 11/17/20   Pahwani, Kasandra Knudsen, MD    Scheduled Meds: . feeding supplement  237 mL Oral BID BM  . folic acid  1 mg Oral Daily  . insulin aspart  0-9 Units Subcutaneous TID AC & HS  . lactulose  30 g Oral TID  . midodrine  5 mg Oral TID WC  . multivitamin with minerals  1 tablet Oral Daily  . octreotide  100 mcg Subcutaneous TID  . pantoprazole (PROTONIX) IV  40 mg  Intravenous Q12H  . rifaximin  550 mg Oral BID  . sodium bicarbonate  1,300 mg Oral TID   Continuous Infusions: . albumin human Stopped (12/03/20 0348)  . sodium bicarbonate (isotonic) 150 mEq in D5W 1000 mL infusion 75 mL/hr at 12/02/20 2226   PRN Meds:.ondansetron **OR** ondansetron (ZOFRAN) IV, oxyCODONE  Allergies as of 12/02/2020  . (No Known Allergies)    Family History  Problem Relation Age of Onset  . Liver disease Neg Hx     Social History   Socioeconomic History  . Marital status: Divorced    Spouse name: Not on file  . Number of children: Not on file  . Years of education: Not on file  . Highest education level: Not on file  Occupational History  . Not on file  Tobacco Use  .  Smoking status: Former Games developermoker  . Smokeless tobacco: Never Used  Vaping Use  . Vaping Use: Never used  Substance and Sexual Activity  . Alcohol use: Yes    Comment: occasionally  . Drug use: Never  . Sexual activity: Not on file  Other Topics Concern  . Not on file  Social History Narrative  . Not on file   Social Determinants of Health   Financial Resource Strain: Not on file  Food Insecurity: Not on file  Transportation Needs: Not on file  Physical Activity: Not on file  Stress: Not on file  Social Connections: Not on file  Intimate Partner Violence: Not on file    Review of Systems: Limited review of system as per HPI.  Review of system was limited because of patient's confusion.  Physical Exam: Vital signs: Vitals:   12/03/20 0800 12/03/20 0815  BP: 102/60 (!) 111/58  Pulse: 76 74  Resp: 14 17  Temp:    SpO2: 100% 100%     Physical Exam Vitals reviewed.  Constitutional:      General: She is not in acute distress. HENT:     Head: Normocephalic and atraumatic.     Nose: Nose normal.     Mouth/Throat:     Mouth: Mucous membranes are moist.     Pharynx: Oropharynx is clear. No oropharyngeal exudate.  Eyes:     General: Scleral icterus present.      Extraocular Movements: Extraocular movements intact.  Cardiovascular:     Rate and Rhythm: Normal rate and regular rhythm.     Heart sounds: No murmur heard.   Pulmonary:     Effort: Pulmonary effort is normal. No respiratory distress.  Abdominal:     General: Bowel sounds are normal. There is no distension.     Palpations: Abdomen is soft.     Tenderness: There is no abdominal tenderness. There is no guarding.  Musculoskeletal:     Cervical back: Normal range of motion and neck supple.     Right lower leg: No edema.     Left lower leg: No edema.  Skin:    General: Skin is warm.     Coloration: Skin is jaundiced.  Neurological:     Mental Status: She is alert.     Comments: Somewhat confused.  Able to give limited history  Psychiatric:        Behavior: Behavior normal.        Judgment: Judgment normal.     GI:  Lab Results: Recent Labs    12/02/20 1444 12/02/20 1832 12/03/20 0351  WBC 17.0*  --  9.5  HGB 10.0* 9.9* 7.6*  HCT 27.8* 29.0* 21.9*  PLT 209  --  155   BMET Recent Labs    12/02/20 1444 12/02/20 1832 12/03/20 0351  NA 124* 127* 126*  K 3.0* 2.5* 3.5  CL 93*  --  95*  CO2 8*  --  10*  GLUCOSE 88  --  132*  BUN 124*  --  120*  CREATININE 7.45*  --  6.84*  CALCIUM 8.9  --  8.0*   LFT Recent Labs    12/03/20 0351  PROT 7.2  ALBUMIN 2.5*  AST 82*  ALT 49*  ALKPHOS 100  BILITOT 11.9*  BILIDIR 6.2*  IBILI 5.7*   PT/INR Recent Labs    12/02/20 1743 12/03/20 0351  LABPROT 41.2* 40.4*  INR 4.3* 4.4*     Studies/Results: US Renal  Result Date: 12/02/2020 CLINICAL DATA:  Initial evaluation for acute renal failure. EXAM: RENAL / URINARY TRACT ULTRASOUND COMPLETE COMPARISON:  None. FINDINGS: Right Kidney: Renal measurements: 11.3 x 4.2 x 5.4 cm = volume: 135.2 mL. Renal echogenicity within normal limits. No nephrolithiasis or hydronephrosis. No focal renal mass. Left Kidney: Renal measurements: 12.8 x 5.7 x 4.2 cm = volume: 159.5 mL. Renal  echogenicity within normal limits. No nephrolithiasis or hydronephrosis. No focal renal mass. Bladder: Decompressed and not well evaluated. Other: Incidental note made of stones and/or sludge within the gallbladder lumen. IMPRESSION: 1. Normal renal ultrasound. No hydronephrosis or other significant finding. 2. Incidental cholelithiasis and/or gallbladder sludge. Electronically Signed   By: Rise Mu M.D.   On: 12/02/2020 18:38   IR ABDOMEN US LIMITED  Result Date: 12/01/2020 CLINICAL DATA:  Cirrhosis and history of ascites requiring prior paracentesis, most recently on 11/12/2020. EXAM: LIMITED ABDOMEN ULTRASOUND FOR ASCITES TECHNIQUE: Limited ultrasound survey for ascites was performed in all four abdominal quadrants. COMPARISON:  11/17/2020 FINDINGS: Sonographic survey demonstrates no visualized ascites in the peritoneal cavity. IMPRESSION: No ascites identified. Electronically Signed   By: Irish Lack M.D.   On: 12/01/2020 11:31    Impression/Plan: -Acute kidney injury.  Most likely hepatorenal syndrome.  Patient did use some Advil PM in last few days.  Was on diuretics but it was on hold since November 25, 2020. -Decompensated cirrhosis. MELD score 40, meld score elevated because of acute kidney injury and coagulopathy. -Coagulopathy.  Was on Xarelto.  Probably worsening coagulopathy because of acute kidney injury reducing clearance of Xarelto. -Hepatic encephalopathy.  Improving. -Anemia.  Hemoglobin stable.  EGD last month was negative for varices.  It showed some esophagitis. -Protein C and protein S deficiency. -History of alcohol use.  Last use prior to previous admission.   Recommendations ------------------------- -Appreciate nephrology input.  Continue octreotide, midodrine and albumin. -Repeat LFTs and INR in the morning. -Hold Xarelto -Avoiding vitamin K because of history of protein C and protein S deficiency.  Patient's hemoglobin is relatively stable. -Poor  prognosis discussed with patient, patient's mother-in-law as well as patient's husband over the phone. -If no improvement in next 24 to 48 hours, consider transfer to [facility] or [facility] for further management.   LOS: 1 day   Kathi Der  MD, FACP 12/03/2020, 8:32 AM  Contact #  [phone]

## 2020-12-03 NOTE — Progress Notes (Signed)
Patient brought to room 4E08 from ED. BP 116/63 (80) HR 75 RR 18 TEMP 95.7 rectally.  CHG bath given.   Patient responsive to voice. Non verbal. Pupils reactive.   Will continue to monitor.

## 2020-12-03 NOTE — Progress Notes (Signed)
[facility] Progress Note    Assessment/ Plan:   1. AKI, suspecting HRS 1. Baseline Cr ~0.9. Lasix and Aldactone stopped. Urine Na <10  On albumin, midodrine, octreotide  Fortunately kidney function slightly improved today  Please check urine culture given UA findings  GI consulted, appreciate recommendations. Questionable if she is a transplant candidate at this time. This will determine her dialysis candidacy moving forward. Fortunately, no need for renal replacement therapy at this junction  Renal U/S without obstruction  Avoid nephrotoxic medications including NSAIDs and iodinated intravenous contrast exposure unless the latter is absolutely indicated.  Preferred narcotic agents for pain control are hydromorphone, fentanyl, and methadone. Morphine should not be used. Avoid Baclofen and avoid oral sodium phosphate and magnesium citrate based laxatives / bowel preps. Continue strict Input and Output monitoring. Will monitor the patient closely with you and intervene or adjust therapy as indicated by changes in clinical status/labs  2. Cirrhosis, decompensated. GI on board, on lactulose. Lasix and aldactone on hold as above 3. Anion gap metabolic acidosis: more so from AKI, possible decreased lactate clearance from cirrhosis but LA ok. Metformin on hold. PO and IV bicarb 4. Hypokalemia: replete PRN 5. Leukocytosis, work up per primary service, WBC now 9.5.check urine culture 6. Hyponatremia. Na 124 on presentation, now 126. Anticipating that as HRS is treated, Na should improve. Overall, a poor prognostic indicator. Replete K as this will also help with hypoNa. If no improvement, then will check osm studies and urine lytes 7. Protein C and S deficiency: home xarelto on hold given supratherapeutic INR, mgmt per primary service 8. Hypoalbuminemia: push protein 9. DM2 with hyperglycemia: elevated glucose in the setting of bicarb gtt, mgmt per primary service 10. Anemia: mgmt  per primary service, rule out bleed  Subjective:   No acute events, patient not very communicative. Mother at bedside, spoke with son over the phone. Has been having hyperglycemia with bicarb (in d5)   Objective:   BP (!) 108/55   Pulse 74   Temp (!) 97.5 F (36.4 C) (Oral)   Resp 14   Ht 5\' 2"  (1.575 m)   Wt 59 kg   SpO2 100%   BMI 23.78 kg/m  No intake or output data in the 24 hours ending 12/03/20 1217 Weight change:   Physical Exam: Gen:NAD, sitting up in bed HEENT: icteric sclera CVS:s1s2, tachy, no m/r/g Resp:cta bl 1218, nt Ext:no edema Skin: jaundiced Neuro: lethargic but awake, follows commands, not very communicative, moves all ext spontaneously  Imaging: YJE:HUDJ Renal  Result Date: 12/02/2020 CLINICAL DATA:  Initial evaluation for acute renal failure. EXAM: RENAL / URINARY TRACT ULTRASOUND COMPLETE COMPARISON:  None. FINDINGS: Right Kidney: Renal measurements: 11.3 x 4.2 x 5.4 cm = volume: 135.2 mL. Renal echogenicity within normal limits. No nephrolithiasis or hydronephrosis. No focal renal mass. Left Kidney: Renal measurements: 12.8 x 5.7 x 4.2 cm = volume: 159.5 mL. Renal echogenicity within normal limits. No nephrolithiasis or hydronephrosis. No focal renal mass. Bladder: Decompressed and not well evaluated. Other: Incidental note made of stones and/or sludge within the gallbladder lumen. IMPRESSION: 1. Normal renal ultrasound. No hydronephrosis or other significant finding. 2. Incidental cholelithiasis and/or gallbladder sludge. Electronically Signed   By: 14/08/2020 M.D.   On: 12/02/2020 18:38    Labs: BMET Recent Labs  Lab 12/02/20 1444 12/02/20 1832 12/03/20 0351  NA 124* 127* 126*  K 3.0* 2.5* 3.5  CL 93*  --  95*  CO2 8*  --  10*  GLUCOSE 88  --  132*  BUN 124*  --  120*  CREATININE 7.45*  --  6.84*  CALCIUM 8.9  --  8.0*  PHOS  --   --  6.6*   CBC Recent Labs  Lab 12/02/20 1444 12/02/20 1832 12/03/20 0351  WBC 17.0*  --  9.5   NEUTROABS  --   --  7.4  HGB 10.0* 9.9* 7.6*  HCT 27.8* 29.0* 21.9*  MCV 108.2*  --  106.8*  PLT 209  --  155    Medications:    . feeding supplement  237 mL Oral BID BM  . folic acid  1 mg Oral Daily  . insulin aspart  0-9 Units Subcutaneous TID AC & HS  . lactulose  30 g Oral TID  . midodrine  5 mg Oral TID WC  . multivitamin with minerals  1 tablet Oral Daily  . octreotide  100 mcg Subcutaneous TID  . pantoprazole (PROTONIX) IV  40 mg Intravenous Q12H  . rifaximin  550 mg Oral BID  . sodium bicarbonate  1,300 mg Oral TID      Anthony Sar, MD [facility] 12/03/2020, 12:17 PM

## 2020-12-03 NOTE — ED Notes (Signed)
Lunch Tray Ordered @ 1050. 

## 2020-12-03 NOTE — Progress Notes (Signed)
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  Upon arrival to ED: Patient was found to have severe AKI with creatinine of 7.45, BUN: 124, calcium: 8.9, elevated AST of 100, ALT: 69.  Elevated bilirubin of 17.1 with slightly elevated alkaline phosphatase of 135.  WBC: 7.0, hemoglobin: 10.0, platelet: 209, INR: 4.3, ammonia: 143.  Nephrology and GI consulted.  Patient admitted for further evaluation and management of worsening kidney function in the setting of decompensated cirrhosis.  Assessment & Plan:  AKI: -Likely hepatorenal syndrome in the setting of decompensated liver cirrhosis. -On admission: BUN 124, creatinine: 7.45, GFR: 6 slightly improved BUN: 120, creatinine: 6.84, GFR: 7 -Ultrasound: Negative for acute findings.  Continue to avoid nephrotoxic medication including NSAIDs.  Hold Lasix and Aldactone. -Continue albumin, midodrine, octreotide -Strict INO's and monitor signs of fluid overload.   -Appreciate nephrology help  Decompensated liver cirrhosis: -LFTs elevated, AST: 100, ALT: 69, bilirubin: 17.1, alkaline phosphatase: 135.  -GI consulted-await recommendations  Hepatic encephalopathy: Ammonia: 143. -Patient symptoms are improving.  Continue scheduled lactulose and rifaximin.  Coagulopathy: INR elevated at 4.3.  Continue to hold Xarelto.    Anion gap metabolic acidosis: Likely in the setting of worsening kidney function, bicarb 8, anion gap 23 on admission improved to bicarb: 10, anion gap 21. -Patient was given sodium bicarb once in ED.  Continue  sodium bicarb 1300 mg 3 times daily.  History of protein C and protein S deficiency: On chronic Xarelto at home.  Hold for now as patient's INR is supratherapeutic.  Macrocytic  anemia: H&H dropped from 10.0-7.6 in 1 day.  MCV: 106.8.  EGD last month was negative for varices it shows esophagitis.  Monitor H&H closely and transfuse as needed.  Hyponatremia: 124 on admission.  Improved to 126. -Patient was given IV fluids in the ED. -Repeat BMP tomorrow a.m.  Grade C esophagitis: -Reviewed EGD from 11/23.  Continue Protonix 40 IV twice daily  Type 2 diabetes mellitus: Hold Metformin.  Continue sliding scale insulin  Hypokalemia: Replenished.  Leukocytosis: Resolved  History of alcohol abuse: Patient's husband tells me that patient's has quit alcohol use.  DVT prophylaxis: SCD Code Status: Full code-confirmed with patient's husband Family Communication: Patient's mother-in-law present at bedside.  Plan of care discussed with patient in length and she verbalized understanding and agreed with it.  Discussed plan of care with patient's husband on the phone.  Discussed about overall poor prognosis and he verbalized understanding.  Disposition Plan: To be determined  Consultants:   Nephrology  GI  Procedures:   None  Antimicrobials:   None  Status is: Inpatient  Dispo: The patient is from: Home              Anticipated d/c is to: Home              Anticipated d/c date is: 3 days              Patient currently is not medically stable to d/c.    Subjective: Patient seen and examined.  Patient's mother-in-law at bedside.  Patient tells me that she feels better, she is alert and oriented  x3, following commands.  She denies nausea, vomiting, abdominal pain, fever or chills.  Objective: Vitals:   12/03/20 1230 12/03/20 1300 12/03/20 1315 12/03/20 1330  BP: (!) 106/52 (!) 102/53 (!) 104/48 (!) 105/55  Pulse: 72 73 75 74  Resp: 13 14 15 15   Temp:    98 F (36.7 C)   TempSrc:    Oral  SpO2: 100% 100% 100% 100%  Weight:      Height:       No intake or output data in the 24 hours ending 12/03/20 1423 Filed Weights   12/03/20 0625  Weight: 59 kg    Examination:  General exam: Appears calm and comfortable, appears weak, sick and tired, on room air, icteric Respiratory system: Clear to auscultation. Respiratory effort normal. Cardiovascular system: S1 & S2 heard, RRR. No JVD, murmurs, rubs, gallops or clicks. No pedal edema. Gastrointestinal system: Abdomen is distended, soft and nontender. No organomegaly or masses felt. Normal bowel sounds heard. Central nervous system: Alert and oriented. No focal neurological deficits. Extremities: Symmetric 5 x 5 power. Skin: No rashes, lesions or ulcers Psychiatry: Judgement and insight appear normal. Mood & affect appropriate.    Data Reviewed: I have personally reviewed following labs and imaging studies  CBC: Recent Labs  Lab 12/02/20 1444 12/02/20 1832 12/03/20 0351  WBC 17.0*  --  9.5  NEUTROABS  --   --  7.4  HGB 10.0* 9.9* 7.6*  HCT 27.8* 29.0* 21.9*  MCV 108.2*  --  106.8*  PLT 209  --  155   Basic Metabolic Panel: Recent Labs  Lab 12/02/20 1444 12/02/20 1832 12/02/20 2147 12/03/20 0351  NA 124* 127*  --  126*  K 3.0* 2.5*  --  3.5  CL 93*  --   --  95*  CO2 8*  --   --  10*  GLUCOSE 88  --   --  132*  BUN 124*  --   --  120*  CREATININE 7.45*  --   --  6.84*  CALCIUM 8.9  --   --  8.0*  MG  --   --  1.8 2.3  PHOS  --   --   --  6.6*   GFR: Estimated Creatinine Clearance: 8 mL/min (A) (by C-G formula based on SCr of 6.84 mg/dL (H)). Liver Function Tests: Recent Labs  Lab 12/02/20 1444 12/03/20 0351  AST 100* 82*  ALT 69* 49*  ALKPHOS 135* 100  BILITOT 17.1* 11.9*  PROT 9.3* 7.2  ALBUMIN 2.7* 2.5*   No results for input(s): LIPASE, AMYLASE in the last 168 hours. Recent Labs  Lab 12/02/20 1820  AMMONIA 143*   Coagulation Profile: Recent Labs  Lab  12/02/20 1743 12/03/20 0351  INR 4.3* 4.4*   Cardiac Enzymes: No results for input(s): CKTOTAL, CKMB, CKMBINDEX, TROPONINI in the last 168 hours. BNP (last 3 results) No results for input(s): PROBNP in the last 8760 hours. HbA1C: No results for input(s): HGBA1C in the last 72 hours. CBG: Recent Labs  Lab 12/02/20 2252 12/03/20 0937 12/03/20 1156  GLUCAP 104* 193* 172*   Lipid Profile: No results for input(s): CHOL, HDL, LDLCALC, TRIG, CHOLHDL, LDLDIRECT in the last 72 hours. Thyroid Function Tests: No results for input(s): TSH, T4TOTAL, FREET4, T3FREE, THYROIDAB in the last 72 hours. Anemia Panel: No results for input(s): VITAMINB12, FOLATE, FERRITIN, TIBC, IRON, RETICCTPCT in the last 72 hours. Sepsis Labs: Recent Labs  Lab 12/02/20 2147  LATICACIDVEN 1.9    Recent  Results (from the past 240 hour(s))  Resp Panel by RT-PCR (Flu A&B, Covid) Nasopharyngeal Swab     Status: None   Collection Time: 12/02/20 10:00 PM   Specimen: Nasopharyngeal Swab; Nasopharyngeal(NP) swabs in vial transport medium  Result Value Ref Range Status   SARS Coronavirus 2 by RT PCR NEGATIVE NEGATIVE Final    Comment: (NOTE) SARS-CoV-2 target nucleic acids are NOT DETECTED.  The SARS-CoV-2 RNA is generally detectable in upper respiratory specimens during the acute phase of infection. The lowest concentration of SARS-CoV-2 viral copies this assay can detect is 138 copies/mL. A negative result does not preclude SARS-Cov-2 infection and should not be used as the sole basis for treatment or other patient management decisions. A negative result may occur with  improper specimen collection/handling, submission of specimen other than nasopharyngeal swab, presence of viral mutation(s) within the areas targeted by this assay, and inadequate number of viral copies(<138 copies/mL). A negative result must be combined with clinical observations, patient history, and epidemiological information. The  expected result is Negative.  Fact Sheet for Patients:  BloggerCourse.comhttps://www.fda.gov/media/152166/download  Fact Sheet for Healthcare Providers:  SeriousBroker.ithttps://www.fda.gov/media/152162/download  This test is no t yet approved or cleared by the [place] FDA and  has been authorized for detection and/or diagnosis of SARS-CoV-2 by FDA under an Emergency Use Authorization (EUA). This EUA will remain  in effect (meaning this test can be used) for the duration of the COVID-19 declaration under Section 564(b)(1) of the Act, 21 U.S.C.section 360bbb-3(b)(1), unless the authorization is terminated  or revoked sooner.       Influenza A by PCR NEGATIVE NEGATIVE Final   Influenza B by PCR NEGATIVE NEGATIVE Final    Comment: (NOTE) The Xpert Xpress SARS-CoV-2/FLU/RSV plus assay is intended as an aid in the diagnosis of influenza from Nasopharyngeal swab specimens and should not be used as a sole basis for treatment. Nasal washings and aspirates are unacceptable for Xpert Xpress SARS-CoV-2/FLU/RSV testing.  Fact Sheet for Patients: BloggerCourse.comhttps://www.fda.gov/media/152166/download  Fact Sheet for Healthcare Providers: SeriousBroker.ithttps://www.fda.gov/media/152162/download  This test is not yet approved or cleared by the [place] FDA and has been authorized for detection and/or diagnosis of SARS-CoV-2 by FDA under an Emergency Use Authorization (EUA). This EUA will remain in effect (meaning this test can be used) for the duration of the COVID-19 declaration under Section 564(b)(1) of the Act, 21 U.S.C. section 360bbb-3(b)(1), unless the authorization is terminated or revoked.  Performed at [facility], [address] [place], [place] [address]       Radiology Studies: US Renal  Result Date: 12/02/2020 CLINICAL DATA:  Initial evaluation for acute renal failure. EXAM: RENAL / URINARY TRACT ULTRASOUND COMPLETE COMPARISON:  None. FINDINGS: Right Kidney: Renal measurements: 11.3 x 4.2 x 5.4 cm = volume:  135.2 mL. Renal echogenicity within normal limits. No nephrolithiasis or hydronephrosis. No focal renal mass. Left Kidney: Renal measurements: 12.8 x 5.7 x 4.2 cm = volume: 159.5 mL. Renal echogenicity within normal limits. No nephrolithiasis or hydronephrosis. No focal renal mass. Bladder: Decompressed and not well evaluated. Other: Incidental note made of stones and/or sludge within the gallbladder lumen. IMPRESSION: 1. Normal renal ultrasound. No hydronephrosis or other significant finding. 2. Incidental cholelithiasis and/or gallbladder sludge. Electronically Signed   By: Rise MuBenjamin  McClintock M.D.   On: 12/02/2020 18:38    Scheduled Meds: . feeding supplement  237 mL Oral BID BM  . folic acid  1 mg Oral Daily  . insulin aspart  0-9 Units Subcutaneous TID  AC & HS  . lactulose  30 g Oral TID  . midodrine  5 mg Oral TID WC  . multivitamin with minerals  1 tablet Oral Daily  . octreotide  100 mcg Subcutaneous TID  . pantoprazole (PROTONIX) IV  40 mg Intravenous Q12H  . rifaximin  550 mg Oral BID  . sodium bicarbonate  1,300 mg Oral TID   Continuous Infusions: . albumin human 25 g (12/03/20 0932)  . sodium bicarbonate (isotonic) 150 mEq in D5W 1000 mL infusion Stopped (12/03/20 1000)     LOS: 1 day   Time spent: 45 minutes   Kyndal Heringer Estill Cotta, MD [facility]  If 7PM-7AM, please contact night-coverage [email or web address] 12/03/2020, 2:23 PM

## 2020-12-03 NOTE — ED Notes (Signed)
Date and time results received: 12/03/20 0518 (use smartphrase ".now" to insert current time)  Test: INR Critical Value: 4.4  Name of Provider Notified: SHalhoub MD

## 2020-12-03 NOTE — Progress Notes (Signed)
Page sent to V. Rathore, MD regarding patients rectal temp and neurological state.   Awaiting new orders.

## 2020-12-03 NOTE — ED Notes (Signed)
Pt placed on bedpan for pt to void. Pt instructed to use call bell once pt is finished. Call bell within reach.

## 2020-12-03 NOTE — ED Notes (Signed)
At 1030 pt blood sugar was checked and it was 193. MD was paged because she also had sodium bicarbonate with dextrose 5% infusing. Per MD Pahwani she would like for dextrose to be stopped until future notice

## 2020-12-04 LAB — COMPREHENSIVE METABOLIC PANEL
ALT: 48 U/L — ABNORMAL HIGH (ref 0–44)
AST: 75 U/L — ABNORMAL HIGH (ref 15–41)
Albumin: 3.6 g/dL (ref 3.5–5.0)
Alkaline Phosphatase: 105 U/L (ref 38–126)
Anion gap: 19 — ABNORMAL HIGH (ref 5–15)
BUN: 112 mg/dL — ABNORMAL HIGH (ref 6–20)
CO2: 19 mmol/L — ABNORMAL LOW (ref 22–32)
Calcium: 9.1 mg/dL (ref 8.9–10.3)
Chloride: 92 mmol/L — ABNORMAL LOW (ref 98–111)
Creatinine, Ser: 6.47 mg/dL — ABNORMAL HIGH (ref 0.44–1.00)
GFR, Estimated: 7 mL/min — ABNORMAL LOW (ref >60)
Glucose, Bld: 163 mg/dL — ABNORMAL HIGH (ref 70–99)
Potassium: 2.2 mmol/L — CL (ref 3.5–5.1)
Sodium: 130 mmol/L — ABNORMAL LOW (ref 135–145)
Total Bilirubin: 12.9 mg/dL — ABNORMAL HIGH (ref 0.3–1.2)
Total Protein: 8.2 g/dL — ABNORMAL HIGH (ref 6.5–8.1)

## 2020-12-04 LAB — CBC
HCT: 23.7 % — ABNORMAL LOW (ref 36.0–46.0)
Hemoglobin: 8.7 g/dL — ABNORMAL LOW (ref 12.0–15.0)
MCH: 36.9 pg — ABNORMAL HIGH (ref 26.0–34.0)
MCHC: 36.7 g/dL — ABNORMAL HIGH (ref 30.0–36.0)
MCV: 100.4 fL — ABNORMAL HIGH (ref 80.0–100.0)
RBC: 2.36 MIL/uL — ABNORMAL LOW (ref 3.87–5.11)
RDW: 21.6 % — ABNORMAL HIGH (ref 11.5–15.5)
WBC: 10.2 10*3/uL (ref 4.0–10.5)
nRBC: 0 % (ref 0.0–0.2)

## 2020-12-04 LAB — PROTIME-INR
INR: 2.2 — ABNORMAL HIGH (ref 0.8–1.2)
Prothrombin Time: 23.6 seconds — ABNORMAL HIGH (ref 11.4–15.2)

## 2020-12-04 LAB — GLUCOSE, CAPILLARY
Glucose-Capillary: 156 mg/dL — ABNORMAL HIGH (ref 70–99)
Glucose-Capillary: 150 mg/dL — ABNORMAL HIGH (ref 70–99)
Glucose-Capillary: 154 mg/dL — ABNORMAL HIGH (ref 70–99)
Glucose-Capillary: 127 mg/dL — ABNORMAL HIGH (ref 70–99)

## 2020-12-04 LAB — MAGNESIUM: Magnesium: 2.2 mg/dL (ref 1.7–2.4)

## 2020-12-04 LAB — AMMONIA: Ammonia: 81 umol/L — ABNORMAL HIGH (ref 9–35)

## 2020-12-04 LAB — CORTISOL-AM, BLOOD: Cortisol - AM: 20.5 ug/dL (ref 6.7–22.6)

## 2020-12-04 MED ORDER — POTASSIUM CHLORIDE IN NACL 40-0.9 MEQ/L-% IV SOLN
Freq: Once | INTRAVENOUS | Status: AC
  Filled 2020-12-04: qty 1000

## 2020-12-04 MED ORDER — SODIUM BICARBONATE 8.4 % IV SOLN
50.0000 meq | Freq: Once | INTRAVENOUS | Status: AC
  Administered 2020-12-04: 02:00:00 50 meq via INTRAVENOUS
  Filled 2020-12-04: qty 50

## 2020-12-04 MED ORDER — POTASSIUM CHLORIDE 10 MEQ/100ML IV SOLN
10.0000 meq | INTRAVENOUS | Status: AC
  Administered 2020-12-04 – 2020-12-05 (×5): 10 meq via INTRAVENOUS
  Filled 2020-12-04 (×5): qty 100

## 2020-12-04 MED ORDER — POTASSIUM CHLORIDE 20 MEQ PO PACK
40.0000 meq | PACK | Freq: Two times a day (BID) | ORAL | Status: DC
  Filled 2020-12-04: qty 2

## 2020-12-04 NOTE — Progress Notes (Signed)
[facility] Progress Note    Assessment/ Plan:   1. AKI, suspecting HRS 1. Baseline Cr ~0.9. Lasix and Aldactone stopped. Urine Na <10  On albumin, midodrine, octreotide  Fortunately kidney function continues to improve slowly.  Ucx pending  GI consulted, appreciate recommendations. Questionable if she is a transplant candidate at this time. This will determine her dialysis candidacy moving forward. Fortunately, no need for renal replacement therapy at this junction  Renal U/S without obstruction  Avoid nephrotoxic medications including NSAIDs and iodinated intravenous contrast exposure unless the latter is absolutely indicated.  Preferred narcotic agents for pain control are hydromorphone, fentanyl, and methadone. Morphine should not be used. Avoid Baclofen and avoid oral sodium phosphate and magnesium citrate based laxatives / bowel preps. Continue strict Input and Output monitoring. Will monitor the patient closely with you and intervene or adjust therapy as indicated by changes in clinical status/labs  2. Cirrhosis, decompensated. GI on board, on lactulose. Last dose of lasix and aldactone: 12/2 3. Anion gap metabolic acidosis: more so from AKI, possible decreased lactate clearance from cirrhosis but LA ok. Metformin on hold. Stopping IV bicarb, continue with PO 4. Hypokalemia: replete PRN, currently receiving , recommend repeating later today 5. Leukocytosis, work up per primary service, WBC now 9.5.check urine culture 6. Hyponatremia. Na 124 on presentation, now 130. Anticipating that as HRS is treated, Na should improve. Overall, a poor prognostic indicator. Replete K as this will also help with hypoNa. If no improvement, then will check osm studies and urine lytes 7. Protein C and S deficiency: home xarelto on hold given supratherapeutic INR, mgmt per primary service 8. Hypoalbuminemia: push protein 9. DM2 with hyperglycemia: elevated glucose in the setting of  bicarb gtt, mgmt per primary service. Stopping bicarb gtt 10. Anemia: mgmt per primary service, rule out bleed  Subjective:   No acute events, still not very communicative and is still confused.   Objective:   BP 110/66 (BP Location: Left Arm)   Pulse 80   Temp 97.6 F (36.4 C) (Oral)   Resp 18   Ht 5\' 2"  (1.575 m)   Wt 59 kg   SpO2 97%   BMI 23.78 kg/m   Intake/Output Summary (Last 24 hours) at 12/04/2020 1137 Last data filed at 12/03/2020 1847 Gross per 24 hour  Intake 100 ml  Output 300 ml  Net -200 ml   Weight change:   Physical Exam: Gen:NAD, laying flat in bed HEENT: icteric sclera CVS:s1s2, tachy, no m/r/g Resp:cta bl 14/09/2020, nt, slightly distended Ext:no edema Skin: jaundiced Neuro: awake, confused, not very communicative, moves all ext spontaneously  Imaging: [id number] Renal  Result Date: 12/02/2020 CLINICAL DATA:  Initial evaluation for acute renal failure. EXAM: RENAL / URINARY TRACT ULTRASOUND COMPLETE COMPARISON:  None. FINDINGS: Right Kidney: Renal measurements: 11.3 x 4.2 x 5.4 cm = volume: 135.2 mL. Renal echogenicity within normal limits. No nephrolithiasis or hydronephrosis. No focal renal mass. Left Kidney: Renal measurements: 12.8 x 5.7 x 4.2 cm = volume: 159.5 mL. Renal echogenicity within normal limits. No nephrolithiasis or hydronephrosis. No focal renal mass. Bladder: Decompressed and not well evaluated. Other: Incidental note made of stones and/or sludge within the gallbladder lumen. IMPRESSION: 1. Normal renal ultrasound. No hydronephrosis or other significant finding. 2. Incidental cholelithiasis and/or gallbladder sludge. Electronically Signed   By: 14/08/2020 M.D.   On: 12/02/2020 18:38    Labs: BMET Recent Labs  Lab 12/02/20 1444 12/02/20 1832 12/03/20 0351 12/03/20 2228 12/04/20 0636  NA  124* 127* 126* 124* 130*  K 3.0* 2.5* 3.5 2.8* 2.2*  CL 93*  --  95* 92* 92*  CO2 8*  --  10* 9* 19*  GLUCOSE 88  --  132* 155* 163*  BUN  124*  --  120* 113* 112*  CREATININE 7.45*  --  6.84* 6.78* 6.47*  CALCIUM 8.9  --  8.0* 8.9 9.1  PHOS  --   --  6.6*  --   --    CBC Recent Labs  Lab 12/02/20 1444 12/02/20 1832 12/03/20 0351 12/04/20 0636  WBC 17.0*  --  9.5 10.2  NEUTROABS  --   --  7.4  --   HGB 10.0* 9.9* 7.6* 8.7*  HCT 27.8* 29.0* 21.9* 23.7*  MCV 108.2*  --  106.8* 100.4*  PLT 209  --  155  --     Medications:    . feeding supplement  237 mL Oral BID BM  . folic acid  1 mg Oral Daily  . insulin aspart  0-9 Units Subcutaneous TID AC & HS  . lactulose  30 g Oral TID  . midodrine  5 mg Oral TID WC  . multivitamin with minerals  1 tablet Oral Daily  . octreotide  100 mcg Subcutaneous TID  . pantoprazole (PROTONIX) IV  40 mg Intravenous Q12H  . rifaximin  550 mg Oral BID  . sodium bicarbonate  1,300 mg Oral TID      Anthony Sar, MD [facility] 12/04/2020, 11:37 AM

## 2020-12-04 NOTE — Progress Notes (Addendum)
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  Upon arrival to ED: Patient was found to have severe AKI with creatinine of 7.45, BUN: 124, calcium: 8.9, elevated AST of 100, ALT: 69.  Elevated bilirubin of 17.1 with slightly elevated alkaline phosphatase of 135.  WBC: 7.0, hemoglobin: 10.0, platelet: 209, INR: 4.3, ammonia: 143.  Nephrology and GI consulted.  Patient admitted for further evaluation and management of worsening kidney function in the setting of decompensated cirrhosis.  Assessment & Plan:  AKI: -Likely hepatorenal syndrome in the setting of decompensated liver cirrhosis. -On admission: BUN 124, creatinine: 7.45, GFR: 6 slightly improved BUN: 112 creatinine: 6.47, GFR: 7 -Ultrasound: Negative for acute findings.  Continue to avoid nephrotoxic medication including NSAIDs.  Hold Lasix and Aldactone. -Continue albumin, midodrine, octreotide -Strict INO's and monitor signs of fluid overload.   -Appreciate nephrology help  Decompensated liver cirrhosis: -LFTs: Improving, AST: 100--> 75, ALT: 69--> 48, bilirubin: 17.1--> 12.9, alkaline phosphatase: 135--> 105.  -She has been referred to [facility] transplant center for outpatient transplant evaluation.-Appreciate GI help.  Hepatic encephalopathy: Ammonia: 143 on admission trended down to 83-81. -Continue scheduled lactulose and rifaximin.  Coagulopathy: On admission INR elevated at 4.3 trended down to 2.2.  Continue to hold Xarelto.    Anion gap metabolic acidosis: Likely in the setting of worsening kidney  function, bicarb 8, anion gap 23 on admission improved to bicarb: 19, anion gap 19. -Stop IV sodium bicarb, continue p.o. sodium bicarb-as per nephrology recommendations.  Hypothermia: Overnight patient rectal temperature was noted to be 95.7.  Patient placed on Bair hugger.  Her TSH came back within normal limit. -Her temperature improved.  Bair hugger removed.  History of protein C and protein S deficiency: On chronic Xarelto at home.  Continue to hold Xarelto.  Macrocytic  anemia: H&H: Remained stable.  EGD last month was negative for varices it shows esophagitis.  Monitor H&H closely and transfuse as needed.  Hyponatremia: 124 on admission.  Improved to 126 to 130. -Patient was given IV fluids in the ED. -Repeat BMP tomorrow a.m.  Severe hypokalemia: Potassium 2.2.  Patient is unable to take p.o.  Ordered IV potassium 10 meq x8.  Monitor closely on telemetry for arrhythmias.  Magnesium level: WNL.  Grade C esophagitis: -Reviewed EGD from 11/23.  Continue Protonix 40 IV twice daily  Type 2 diabetes mellitus: Hold Metformin.  Blood glucose elevated likely in the setting of sodium bicarb GTT.  Continue sliding scale insulin.  Monitor blood sugar closely.  Leukocytosis: Resolved  History of alcohol abuse: Patient's husband tells me that patient's has quit alcohol use.  Prognosis: Overall poor prognosis-family is aware of that  DVT prophylaxis: SCD Code Status: Full code-confirmed with patient's husband Family Communication: Patient's mother-in-law present at bedside.  Plan of care discussed with patient in length and she verbalized understanding and agreed with it.  Discussed plan of care with patient's husband on the phone and he verbalized understanding.   Disposition Plan: To be determined  Consultants:   Nephrology  GI  Procedures:   None  Antimicrobials:   None  Status is: Inpatient  Dispo: The patient  is from: Home              Anticipated d/c is to: Home               Anticipated d/c date is: 3 days              Patient currently is not medically stable to d/c.   Subjective: Patient seen and examined.  Sitting comfortably on the bed.  Appears icteric and confused.  She answered " I am fine" with all of question.  She denied any pain or difficulty in breathing. Objective: Vitals:   12/04/20 0400 12/04/20 0409 12/04/20 0900 12/04/20 1100  BP: (!) 98/59  110/66 (!) 111/55  Pulse: 67 80  79  Resp: 12 19 18 15   Temp: 97.8 F (36.6 C) 97.8 F (36.6 C) 97.6 F (36.4 C) 98 F (36.7 C)  TempSrc: Oral Oral Oral Oral  SpO2: 99% 99% 97% 99%  Weight:      Height:        Intake/Output Summary (Last 24 hours) at 12/04/2020 1518 Last data filed at 12/04/2020 1200 Gross per 24 hour  Intake 150 ml  Output 300 ml  Net -150 ml   Filed Weights   12/03/20 0625  Weight: 59 kg    Examination: General exam: Appears calm and comfortable, on room air, icteric, appears confused Respiratory system: Clear to auscultation. Respiratory effort normal. Cardiovascular system: S1 & S2 heard, RRR. No JVD, murmurs, rubs, gallops or clicks. No pedal edema. Gastrointestinal system: Abdomen is mild distended, nontender on palpation, bowel sounds positive. Central nervous system: Alert but confused  skin: Icteric    Data Reviewed: I have personally reviewed following labs and imaging studies  CBC: Recent Labs  Lab 12/02/20 1444 12/02/20 1832 12/03/20 0351 12/04/20 0636  WBC 17.0*  --  9.5 10.2  NEUTROABS  --   --  7.4  --   HGB 10.0* 9.9* 7.6* 8.7*  HCT 27.8* 29.0* 21.9* 23.7*  MCV 108.2*  --  106.8* 100.4*  PLT 209  --  155  --    Basic Metabolic Panel: Recent Labs  Lab 12/02/20 1444 12/02/20 1832 12/02/20 2147 12/03/20 0351 12/03/20 2228 12/04/20 0636  NA 124* 127*  --  126* 124* 130*  K 3.0* 2.5*  --  3.5 2.8* 2.2*  CL 93*  --   --  95* 92* 92*  CO2 8*  --   --  10* 9* 19*  GLUCOSE 88  --   --  132* 155* 163*  BUN 124*  --   --  120*  113* 112*  CREATININE 7.45*  --   --  6.84* 6.78* 6.47*  CALCIUM 8.9  --   --  8.0* 8.9 9.1  MG  --   --  1.8 2.3  --  2.2  PHOS  --   --   --  6.6*  --   --    GFR: Estimated Creatinine Clearance: 8.5 mL/min (A) (by C-G formula based on SCr of 6.47 mg/dL (H)). Liver Function Tests: Recent Labs  Lab 12/02/20 1444 12/03/20 0351 12/04/20 0636  AST 100* 82* 75*  ALT 69* 49* 48*  ALKPHOS 135* 100 105  BILITOT 17.1* 11.9* 12.9*  PROT 9.3* 7.2 8.2*  ALBUMIN 2.7* 2.5* 3.6   No results for input(s): LIPASE, AMYLASE in the last 168 hours. Recent Labs  Lab 12/02/20 1820 12/03/20 2228 12/04/20 0636  AMMONIA 143* 83* 81*   Coagulation Profile: Recent  Labs  Lab 12/02/20 1743 12/03/20 0351 12/04/20 0636  INR 4.3* 4.4* 2.2*   Cardiac Enzymes: No results for input(s): CKTOTAL, CKMB, CKMBINDEX, TROPONINI in the last 168 hours. BNP (last 3 results) No results for input(s): PROBNP in the last 8760 hours. HbA1C: No results for input(s): HGBA1C in the last 72 hours. CBG: Recent Labs  Lab 12/03/20 1156 12/03/20 1630 12/03/20 2216 12/04/20 0548 12/04/20 1157  GLUCAP 172* 134* 147* 156* 150*   Lipid Profile: No results for input(s): CHOL, HDL, LDLCALC, TRIG, CHOLHDL, LDLDIRECT in the last 72 hours. Thyroid Function Tests: Recent Labs    12/03/20 2228  TSH 2.739   Anemia Panel: No results for input(s): VITAMINB12, FOLATE, FERRITIN, TIBC, IRON, RETICCTPCT in the last 72 hours. Sepsis Labs: Recent Labs  Lab 12/02/20 2147  LATICACIDVEN 1.9    Recent Results (from the past 240 hour(s))  Resp Panel by RT-PCR (Flu A&B, Covid) Nasopharyngeal Swab     Status: None   Collection Time: 12/02/20 10:00 PM   Specimen: Nasopharyngeal Swab; Nasopharyngeal(NP) swabs in vial transport medium  Result Value Ref Range Status   SARS Coronavirus 2 by RT PCR NEGATIVE NEGATIVE Final    Comment: (NOTE) SARS-CoV-2 target nucleic acids are NOT DETECTED.  The SARS-CoV-2 RNA is generally  detectable in upper respiratory specimens during the acute phase of infection. The lowest concentration of SARS-CoV-2 viral copies this assay can detect is 138 copies/mL. A negative result does not preclude SARS-Cov-2 infection and should not be used as the sole basis for treatment or other patient management decisions. A negative result may occur with  improper specimen collection/handling, submission of specimen other than nasopharyngeal swab, presence of viral mutation(s) within the areas targeted by this assay, and inadequate number of viral copies(<138 copies/mL). A negative result must be combined with clinical observations, patient history, and epidemiological information. The expected result is Negative.  Fact Sheet for Patients:  BloggerCourse.com  Fact Sheet for Healthcare Providers:  SeriousBroker.it  This test is no t yet approved or cleared by the [place] FDA and  has been authorized for detection and/or diagnosis of SARS-CoV-2 by FDA under an Emergency Use Authorization (EUA). This EUA will remain  in effect (meaning this test can be used) for the duration of the COVID-19 declaration under Section 564(b)(1) of the Act, 21 U.S.C.section 360bbb-3(b)(1), unless the authorization is terminated  or revoked sooner.       Influenza A by PCR NEGATIVE NEGATIVE Final   Influenza B by PCR NEGATIVE NEGATIVE Final    Comment: (NOTE) The Xpert Xpress SARS-CoV-2/FLU/RSV plus assay is intended as an aid in the diagnosis of influenza from Nasopharyngeal swab specimens and should not be used as a sole basis for treatment. Nasal washings and aspirates are unacceptable for Xpert Xpress SARS-CoV-2/FLU/RSV testing.  Fact Sheet for Patients: BloggerCourse.com  Fact Sheet for Healthcare Providers: SeriousBroker.it  This test is not yet approved or cleared by the [place] FDA  and has been authorized for detection and/or diagnosis of SARS-CoV-2 by FDA under an Emergency Use Authorization (EUA). This EUA will remain in effect (meaning this test can be used) for the duration of the COVID-19 declaration under Section 564(b)(1) of the Act, 21 U.S.C. section 360bbb-3(b)(1), unless the authorization is terminated or revoked.  Performed at [facility], [address] [place], [place] [address]   Blood culture (routine x 2)     Status: None (Preliminary result)   Collection Time: 12/02/20 10:20 PM   Specimen:  BLOOD  Result Value Ref Range Status   Specimen Description BLOOD SITE NOT SPECIFIED  Final   Special Requests   Final    BOTTLES DRAWN AEROBIC AND ANAEROBIC Blood Culture results may not be optimal due to an inadequate volume of blood received in culture bottles   Culture   Final    NO GROWTH 1 DAY Performed at [facility], [address] [place], [place] [address]    Report Status PENDING  Incomplete      Radiology Studies: US Renal  Result Date: 12/02/2020 CLINICAL DATA:  Initial evaluation for acute renal failure. EXAM: RENAL / URINARY TRACT ULTRASOUND COMPLETE COMPARISON:  None. FINDINGS: Right Kidney: Renal measurements: 11.3 x 4.2 x 5.4 cm = volume: 135.2 mL. Renal echogenicity within normal limits. No nephrolithiasis or hydronephrosis. No focal renal mass. Left Kidney: Renal measurements: 12.8 x 5.7 x 4.2 cm = volume: 159.5 mL. Renal echogenicity within normal limits. No nephrolithiasis or hydronephrosis. No focal renal mass. Bladder: Decompressed and not well evaluated. Other: Incidental note made of stones and/or sludge within the gallbladder lumen. IMPRESSION: 1. Normal renal ultrasound. No hydronephrosis or other significant finding. 2. Incidental cholelithiasis and/or gallbladder sludge. Electronically Signed   By: Rise MuBenjamin  McClintock M.D.   On: 12/02/2020 18:38    Scheduled Meds: . feeding supplement  237 mL Oral BID BM  .  folic acid  1 mg Oral Daily  . insulin aspart  0-9 Units Subcutaneous TID AC & HS  . lactulose  30 g Oral TID  . midodrine  5 mg Oral TID WC  . multivitamin with minerals  1 tablet Oral Daily  . octreotide  100 mcg Subcutaneous TID  . pantoprazole (PROTONIX) IV  40 mg Intravenous Q12H  . rifaximin  550 mg Oral BID  . sodium bicarbonate  1,300 mg Oral TID   Continuous Infusions: . potassium chloride       LOS: 2 days   Time spent: 45 minutes   Antrice Pal Estill Cotta Christon Gallaway, MD [facility]  If 7PM-7AM, please contact night-coverage [email or web address] 12/04/2020, 3:18 PM

## 2020-12-04 NOTE — Progress Notes (Signed)
CRITICAL VALUE ALERT  Critical Value:  Potassium 2.2  Date & Time Notied: 12/04/20 @ 0756  Provider Notified: Dr.Pahwani   Orders Received/Actions taken: waiting for new order   Everlean Cherry, RN

## 2020-12-04 NOTE — Progress Notes (Signed)
[facility] Gastroenterology Progress Note  Shelby Conner 49 y.o. October 29, 1971  CC: Acute kidney injury, decompensated cirrhosis, encephalopathy   Subjective: Patient seen and examined at bedside.  Resting comfortably.  Alert but confused.  Discussed with RN at bedside.  No acute issues except for confusion at this time.  ROS : Not able to obtain   Objective: Vital signs in last 24 hours: Vitals:   12/04/20 0409 12/04/20 0900  BP:  110/66  Pulse: 80   Resp: 19 18  Temp: 97.8 F (36.6 C) 97.6 F (36.4 C)  SpO2: 99% 97%    Physical Exam:  General:   Alert but confused.  Resting comfortably in the bed.  Head:  Normocephalic, without obvious abnormality, atraumatic  Eyes:   Scleral icterus noted  Lungs:    No respiratory distress  Heart:  Regular rate and rhythm, S1, S2 normal  Abdomen:    Abdomen is mild distended, nontender, bowel sounds present.  No peritoneal signs  Extremities: no  edema       Lab Results: Recent Labs    12/03/20 0351 12/03/20 2228 12/04/20 0636  NA 126* 124* 130*  K 3.5 2.8* 2.2*  CL 95* 92* 92*  CO2 10* 9* 19*  GLUCOSE 132* 155* 163*  BUN 120* 113* 112*  CREATININE 6.84* 6.78* 6.47*  CALCIUM 8.0* 8.9 9.1  MG 2.3  --  2.2  PHOS 6.6*  --   --    Recent Labs    12/03/20 0351 12/04/20 0636  AST 82* 75*  ALT 49* 48*  ALKPHOS 100 105  BILITOT 11.9* 12.9*  PROT 7.2 8.2*  ALBUMIN 2.5* 3.6   Recent Labs    12/02/20 1444 12/02/20 1832 12/03/20 0351 12/04/20 0636  WBC 17.0*  --  9.5 10.2  NEUTROABS  --   --  7.4  --   HGB 10.0*   < > 7.6* 8.7*  HCT 27.8*   < > 21.9* 23.7*  MCV 108.2*  --  106.8* 100.4*  PLT 209  --  155  --    < > = values in this interval not displayed.   Recent Labs    12/03/20 0351 12/04/20 0636  LABPROT 40.4* 23.6*  INR 4.4* 2.2*      Assessment/Plan: -Acute kidney injury.  Most likely hepatorenal syndrome.  Patient did use some Advil PM in last few days.  Was on diuretics but it was on hold since  November 25, 2020. -Decompensated cirrhosis. MELD score 40 on admission, meld score elevated because of acute kidney injury and coagulopathy. -Coagulopathy.  Was on Xarelto.  Probably worsening coagulopathy because of acute kidney injury reducing clearance of Xarelto. -Hepatic encephalopathy.   -Anemia.  Hemoglobin stable.  EGD last month was negative for varices.  It showed some esophagitis. -Protein C and protein S deficiency. -History of alcohol use.  Last use prior to previous admission.   Recommendations ------------------------- -Patient's kidney functions are improving slowly.  INR improved from 4.4 to 2.2. -Appreciate nephrology input.  Continue octreotide, midodrine and albumin. -She may benefit from IV potassium replacement. -Continue lactulose enema and rifaximin -Advance diet to full liquid  -Repeat CMP and INR in the morning. -Hold Xarelto -Avoiding vitamin K because of history of protein C and protein S deficiency.  Patient's hemoglobin is relatively stable. -Poor prognosis . -She has been referred to [facility] transplant center for an outpatient transplant evaluation.   Kathi Der MD, FACP 12/04/2020, 11:01 AM  Contact #  [phone]

## 2020-12-05 ENCOUNTER — Other Ambulatory Visit: Payer: Self-pay

## 2020-12-05 LAB — URINALYSIS, ROUTINE W REFLEX MICROSCOPIC
Bilirubin Urine: NEGATIVE
Glucose, UA: NEGATIVE mg/dL
Ketones, ur: NEGATIVE mg/dL
Leukocytes,Ua: NEGATIVE
Nitrite: NEGATIVE
Protein, ur: 100 mg/dL — AB
Specific Gravity, Urine: 1.011 (ref 1.005–1.030)
pH: 5 (ref 5.0–8.0)

## 2020-12-05 LAB — COMPREHENSIVE METABOLIC PANEL
ALT: 45 U/L — ABNORMAL HIGH (ref 0–44)
ALT: 51 U/L — ABNORMAL HIGH (ref 0–44)
AST: 80 U/L — ABNORMAL HIGH (ref 15–41)
AST: 92 U/L — ABNORMAL HIGH (ref 15–41)
Albumin: 3.1 g/dL — ABNORMAL LOW (ref 3.5–5.0)
Albumin: 3.2 g/dL — ABNORMAL LOW (ref 3.5–5.0)
Alkaline Phosphatase: 89 U/L (ref 38–126)
Alkaline Phosphatase: 104 U/L (ref 38–126)
Anion gap: 19 — ABNORMAL HIGH (ref 5–15)
Anion gap: 21 — ABNORMAL HIGH (ref 5–15)
BUN: 104 mg/dL — ABNORMAL HIGH (ref 6–20)
BUN: 103 mg/dL — ABNORMAL HIGH (ref 6–20)
CO2: 17 mmol/L — ABNORMAL LOW (ref 22–32)
CO2: 15 mmol/L — ABNORMAL LOW (ref 22–32)
Calcium: 9.2 mg/dL (ref 8.9–10.3)
Calcium: 9.3 mg/dL (ref 8.9–10.3)
Chloride: 93 mmol/L — ABNORMAL LOW (ref 98–111)
Chloride: 94 mmol/L — ABNORMAL LOW (ref 98–111)
Creatinine, Ser: 5.83 mg/dL — ABNORMAL HIGH (ref 0.44–1.00)
Creatinine, Ser: 5.79 mg/dL — ABNORMAL HIGH (ref 0.44–1.00)
GFR, Estimated: 8 mL/min — ABNORMAL LOW (ref >60)
GFR, Estimated: 9 mL/min — ABNORMAL LOW (ref >60)
Glucose, Bld: 135 mg/dL — ABNORMAL HIGH (ref 70–99)
Glucose, Bld: 130 mg/dL — ABNORMAL HIGH (ref 70–99)
Potassium: 2.9 mmol/L — ABNORMAL LOW (ref 3.5–5.1)
Potassium: 3.5 mmol/L (ref 3.5–5.1)
Sodium: 129 mmol/L — ABNORMAL LOW (ref 135–145)
Sodium: 130 mmol/L — ABNORMAL LOW (ref 135–145)
Total Bilirubin: 11.3 mg/dL — ABNORMAL HIGH (ref 0.3–1.2)
Total Bilirubin: 11.7 mg/dL — ABNORMAL HIGH (ref 0.3–1.2)
Total Protein: 7.2 g/dL (ref 6.5–8.1)
Total Protein: 7.4 g/dL (ref 6.5–8.1)

## 2020-12-05 LAB — URINE CULTURE: Culture: NO GROWTH

## 2020-12-05 LAB — GLUCOSE, CAPILLARY
Glucose-Capillary: 136 mg/dL — ABNORMAL HIGH (ref 70–99)
Glucose-Capillary: 173 mg/dL — ABNORMAL HIGH (ref 70–99)
Glucose-Capillary: 133 mg/dL — ABNORMAL HIGH (ref 70–99)
Glucose-Capillary: 144 mg/dL — ABNORMAL HIGH (ref 70–99)

## 2020-12-05 LAB — CBC
HCT: 23.1 % — ABNORMAL LOW (ref 36.0–46.0)
Hemoglobin: 8.2 g/dL — ABNORMAL LOW (ref 12.0–15.0)
MCH: 36.9 pg — ABNORMAL HIGH (ref 26.0–34.0)
MCHC: 35.5 g/dL (ref 30.0–36.0)
MCV: 104.1 fL — ABNORMAL HIGH (ref 80.0–100.0)
Platelets: 187 10*3/uL (ref 150–400)
RBC: 2.22 MIL/uL — ABNORMAL LOW (ref 3.87–5.11)
RDW: 22.2 % — ABNORMAL HIGH (ref 11.5–15.5)
WBC: 12.5 10*3/uL — ABNORMAL HIGH (ref 4.0–10.5)
nRBC: 0 % (ref 0.0–0.2)

## 2020-12-05 LAB — PROTIME-INR
INR: 2.1 — ABNORMAL HIGH (ref 0.8–1.2)
Prothrombin Time: 22.4 seconds — ABNORMAL HIGH (ref 11.4–15.2)

## 2020-12-05 LAB — AMMONIA: Ammonia: 106 umol/L — ABNORMAL HIGH (ref 9–35)

## 2020-12-05 MED ORDER — POTASSIUM CHLORIDE 10 MEQ/100ML IV SOLN
10.0000 meq | INTRAVENOUS | Status: AC
  Administered 2020-12-05 (×6): 10 meq via INTRAVENOUS
  Filled 2020-12-05 (×6): qty 100

## 2020-12-05 MED ORDER — POTASSIUM CHLORIDE 10 MEQ/100ML IV SOLN
10.0000 meq | INTRAVENOUS | Status: AC
  Administered 2020-12-05 (×3): 10 meq via INTRAVENOUS
  Filled 2020-12-05 (×3): qty 100

## 2020-12-05 MED ORDER — LACTULOSE 10 GM/15ML PO SOLN: 10.0000 g | Freq: Two times a day (BID) | ORAL | Status: DC

## 2020-12-05 MED ORDER — SIMETHICONE 40 MG/0.6ML PO SUSP
40.0000 mg | Freq: Four times a day (QID) | ORAL | Status: DC | PRN
  Administered 2020-12-05 – 2020-12-11 (×3): 40 mg via ORAL
  Filled 2020-12-05 (×4): qty 0.6

## 2020-12-05 NOTE — Progress Notes (Signed)
[facility] Progress Note    Assessment/ Plan:   1. AKI, secondary to HRS 1. Baseline Cr ~0.9. Lasix and Aldactone stopped. Urine Na <10  On albumin, midodrine, octreotide  Fortunately kidney function continues to improve slowly.  Ucx neg 12/10  GI consulted, appreciate recommendations. Questionable if she is a transplant candidate at this time. This will determine her dialysis candidacy moving forward. Fortunately, no need for renal replacement therapy at this junction. Has an outpatient referral to [facility] for transplant eval  Renal U/S without obstruction  Avoid nephrotoxic medications including NSAIDs and iodinated intravenous contrast exposure unless the latter is absolutely indicated.  Preferred narcotic agents for pain control are hydromorphone, fentanyl, and methadone. Morphine should not be used. Avoid Baclofen and avoid oral sodium phosphate and magnesium citrate based laxatives / bowel preps. Continue strict Input and Output monitoring. Will monitor the patient closely with you and intervene or adjust therapy as indicated by changes in clinical status/labs  2. Cirrhosis, decompensated. GI on board, on lactulose. Last dose of lasix and aldactone 12/2. Poor prognosis overall (especially with hyponatremia). 3. Anion gap metabolic acidosis: more so from AKI, possible decreased lactate clearance from cirrhosis but LA ok. Metformin on hold. Stopped IV bicarb, continue with PO 4. Hypokalemia: replete PRN, currently receiving , recommend repeating later today 5. Leukocytosis, work up per primary service, WBC now 9.5.check urine culture 6. Hyponatremia. Na 124 on presentation, now 130. Anticipating that as HRS is treated, Na should improve/remain stable. Overall, a poor prognostic indicator. Replete K as this will also help with hypoNa. If no improvement, then will check osm studies and urine lytes 7. Protein C and S deficiency: home xarelto on hold given supratherapeutic  INR, mgmt per primary service 8. Hypoalbuminemia: push protein 9. DM2 with hyperglycemia: elevated glucose in the setting of bicarb gtt, mgmt per primary service. Stopping bicarb gtt 10. Anemia: mgmt per primary service, rule out bleed  Subjective:   No acute events, more alert today.    Objective:   BP 117/64 (BP Location: Left Arm)   Pulse 82   Temp 98.4 F (36.9 C) (Oral)   Resp 13   Ht 5\' 2"  (1.575 m)   Wt 59 kg   SpO2 99%   BMI 23.78 kg/m   Intake/Output Summary (Last 24 hours) at 12/05/2020 1313 Last data filed at 12/05/2020 1000 Gross per 24 hour  Intake 419.3 ml  Output --  Net 419.3 ml   Weight change:   Physical Exam: Gen:NAD, laying flat in bed HEENT: icteric sclera CVS:s1s2, tachy, no m/r/g Resp:cta bl 14/11/2020, nt, slightly distended Ext:no edema Skin: jaundiced Neuro: awake, confused, not very communicative, moves all ext spontaneously  Imaging: No results found.  Labs: BMET Recent Labs  Lab 12/02/20 1444 12/02/20 1832 12/03/20 0351 12/03/20 2228 12/04/20 0636 12/05/20 0207 12/05/20 1031  NA 124* 127* 126* 124* 130* 129* 130*  K 3.0* 2.5* 3.5 2.8* 2.2* 2.9* 3.5  CL 93*  --  95* 92* 92* 93* 94*  CO2 8*  --  10* 9* 19* 17* 15*  GLUCOSE 88  --  132* 155* 163* 135* 130*  BUN 124*  --  120* 113* 112* 104* 103*  CREATININE 7.45*  --  6.84* 6.78* 6.47* 5.83* 5.79*  CALCIUM 8.9  --  8.0* 8.9 9.1 9.2 9.3  PHOS  --   --  6.6*  --   --   --   --    CBC Recent Labs  Lab 12/02/20  1444 12/02/20 1832 12/03/20 0351 12/04/20 0636 12/05/20 0207  WBC 17.0*  --  9.5 10.2 12.5*  NEUTROABS  --   --  7.4  --   --   HGB 10.0* 9.9* 7.6* 8.7* 8.2*  HCT 27.8* 29.0* 21.9* 23.7* 23.1*  MCV 108.2*  --  106.8* 100.4* 104.1*  PLT 209  --  155  --  187    Medications:    . feeding supplement  237 mL Oral BID BM  . folic acid  1 mg Oral Daily  . insulin aspart  0-9 Units Subcutaneous TID AC & HS  . lactulose  30 g Oral TID  . midodrine  5 mg Oral TID  WC  . multivitamin with minerals  1 tablet Oral Daily  . octreotide  100 mcg Subcutaneous TID  . pantoprazole (PROTONIX) IV  40 mg Intravenous Q12H  . rifaximin  550 mg Oral BID  . sodium bicarbonate  1,300 mg Oral TID      Anthony Sar, MD [facility] 12/05/2020, 1:13 PM

## 2020-12-05 NOTE — Progress Notes (Signed)
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  Upon arrival to ED: Patient was found to have severe AKI with creatinine of 7.45, BUN: 124, calcium: 8.9, elevated AST of 100, ALT: 69.  Elevated bilirubin of 17.1 with slightly elevated alkaline phosphatase of 135.  WBC: 7.0, hemoglobin: 10.0, platelet: 209, INR: 4.3, ammonia: 143.  Nephrology and GI consulted.  Patient admitted for further evaluation and management of worsening kidney function in the setting of decompensated cirrhosis.  Assessment & Plan:  AKI: -Likely hepatorenal syndrome in the setting of decompensated liver cirrhosis. -On admission: BUN 124, creatinine: 7.45, GFR: 6  -Ultrasound: Negative for acute findings.  Continue to avoid nephrotoxic medication including NSAIDs.  Hold Lasix and Aldactone. -Continue albumin, midodrine, octreotide -Strict INO's and monitor signs of fluid overload.   -Appreciate nephrology help  Decompensated liver cirrhosis: -LFTs: Improving, AST: 100--> 75--> 80, ALT: 69--> 48--> 45, bilirubin: 17.1--> 12.9--> 11.3, alkaline phosphatase: 135--> 105--> 89.  -She has been referred to [facility] transplant center for outpatient transplant evaluation.-Appreciate GI help.  Hepatic encephalopathy: Ammonia: 143 on admission trended down to 83-81. -Ammonia level trended up to 106. -Continue scheduled lactulose and rifaximin. -Advance diet to soft today.  Coagulopathy: On admission INR elevated at 4.3 trended down to 2.2 to 2.1.  Continue to hold Xarelto.    Anion gap metabolic acidosis:  Likely in the setting of worsening kidney function, bicarb 8, anion gap 23 on admission.  Improving -Received IV sodium bicarb on admission, continue p.o. sodium bicarb-as per nephrology recommendations.  Hypothermia: Resolved.  TSH: WNL. -Urine culture: No growth.  History of protein C and protein S deficiency: On chronic Xarelto at home.  Continue to hold Xarelto.  Macrocytic  anemia: H&H: Remained stable.  EGD last month was negative for varices it shows esophagitis.  Monitor H&H closely and transfuse as needed.  Hyponatremia: 124 on admission.  Improving -Patient was given IV fluids in the ED. -Repeat BMP tomorrow a.m.  Severe hypokalemia: Potassium 2.2.  Improving.  Potassium 2.9 this morning.  Continue with IV replacement.  Continue to monitor on telemetry to check for arrhythmias.    Grade C esophagitis: -Reviewed EGD from 11/23.  Continue Protonix 40 IV twice daily  Type 2 diabetes mellitus: Hold Metformin.  Blood glucose elevated likely in the setting of sodium bicarb GTT.  Continue sliding scale insulin.  Monitor blood sugar closely.  Leukocytosis: Resolved  History of alcohol abuse: Patient's husband tells me that patient's has quit alcohol use.  Prognosis: Overall poor prognosis-family is aware of that  DVT prophylaxis: SCD Code Status: Full code-confirmed with patient's husband Family Communication: Patient's husband present at bedside.  Plan of care discussed with patient in length and she verbalized understanding and agreed with it.  Disposition Plan: To be determined  Consultants:   Nephrology  GI  Procedures:   None  Antimicrobials:   None  Status is: Inpatient  Dispo: The patient is from: Home              Anticipated d/c is to: Home              Anticipated d/c  date is: 3 days              Patient currently is not medically stable to d/c.   Subjective: Patient seen and examined.  Husband at bedside.  More alert this morning and eating  breakfast.  No acute events overnight.   Objective: Vitals:   12/05/20 0431 12/05/20 0839 12/05/20 0900 12/05/20 1103  BP: 101/60 105/68 122/77 117/64  Pulse: 79 68 77 82  Resp: 19 16 16 13   Temp: 98 F (36.7 C) 98.6 F (37 C) 98.3 F (36.8 C) 98.4 F (36.9 C)  TempSrc: Oral Oral Oral Oral  SpO2: 97% 99% 99% 99%  Weight:      Height:        Intake/Output Summary (Last 24 hours) at 12/05/2020 1459 Last data filed at 12/05/2020 1324 Gross per 24 hour  Intake 659.3 ml  Output --  Net 659.3 ml   Filed Weights   12/03/20 0625  Weight: 59 kg    Examination: General exam: Appears calm and comfortable, on room air, icteric, appears more alert and eating breakfast.  Respiratory system: Clear to auscultation. Respiratory effort normal. Cardiovascular system: S1 & S2 heard, RRR. No JVD, murmurs, rubs, gallops or clicks. No pedal edema. Gastrointestinal system: Abdomen is mild distended, nontender on palpation, bowel sounds positive. Central nervous system: Alert and following commands. skin: Icteric    Data Reviewed: I have personally reviewed following labs and imaging studies  CBC: Recent Labs  Lab 12/02/20 1444 12/02/20 1832 12/03/20 0351 12/04/20 0636 12/05/20 0207  WBC 17.0*  --  9.5 10.2 12.5*  NEUTROABS  --   --  7.4  --   --   HGB 10.0* 9.9* 7.6* 8.7* 8.2*  HCT 27.8* 29.0* 21.9* 23.7* 23.1*  MCV 108.2*  --  106.8* 100.4* 104.1*  PLT 209  --  155  --  187   Basic Metabolic Panel: Recent Labs  Lab 12/02/20 2147 12/03/20 0351 12/03/20 2228 12/04/20 0636 12/05/20 0207 12/05/20 1031  NA  --  126* 124* 130* 129* 130*  K  --  3.5 2.8* 2.2* 2.9* 3.5  CL  --  95* 92* 92* 93* 94*  CO2  --  10* 9* 19* 17* 15*  GLUCOSE  --  132* 155* 163* 135* 130*  BUN  --  120* 113* 112* 104* 103*  CREATININE  --  6.84* 6.78* 6.47* 5.83* 5.79*  CALCIUM  --  8.0* 8.9 9.1 9.2 9.3  MG 1.8 2.3  --  2.2  --   --   PHOS  --  6.6*  --   --   --   --    GFR: Estimated  Creatinine Clearance: 9.5 mL/min (A) (by C-G formula based on SCr of 5.79 mg/dL (H)). Liver Function Tests: Recent Labs  Lab 12/02/20 1444 12/03/20 0351 12/04/20 0636 12/05/20 0207 12/05/20 1031  AST 100* 82* 75* 80* 92*  ALT 69* 49* 48* 45* 51*  ALKPHOS 135* 100 105 89 104  BILITOT 17.1* 11.9* 12.9* 11.3* 11.7*  PROT 9.3* 7.2 8.2* 7.2 7.4  ALBUMIN 2.7* 2.5* 3.6 3.1* 3.2*   No results for input(s): LIPASE, AMYLASE in the last 168 hours. Recent Labs  Lab 12/02/20 1820 12/03/20 2228 12/04/20 0636 12/05/20 0207  AMMONIA 143* 83* 81* 106*   Coagulation Profile: Recent Labs  Lab 12/02/20 1743 12/03/20 0351 12/04/20 0636 12/05/20 0207  INR 4.3* 4.4* 2.2* 2.1*   Cardiac Enzymes: No results for input(s): CKTOTAL, CKMB, CKMBINDEX, TROPONINI  in the last 168 hours. BNP (last 3 results) No results for input(s): PROBNP in the last 8760 hours. HbA1C: No results for input(s): HGBA1C in the last 72 hours. CBG: Recent Labs  Lab 12/04/20 1157 12/04/20 1604 12/04/20 2130 12/05/20 0613 12/05/20 1149  GLUCAP 150* 154* 127* 136* 173*   Lipid Profile: No results for input(s): CHOL, HDL, LDLCALC, TRIG, CHOLHDL, LDLDIRECT in the last 72 hours. Thyroid Function Tests: Recent Labs    12/03/20 2228  TSH 2.739   Anemia Panel: No results for input(s): VITAMINB12, FOLATE, FERRITIN, TIBC, IRON, RETICCTPCT in the last 72 hours. Sepsis Labs: Recent Labs  Lab 12/02/20 2147  LATICACIDVEN 1.9    Recent Results (from the past 240 hour(s))  Resp Panel by RT-PCR (Flu A&B, Covid) Nasopharyngeal Swab     Status: None   Collection Time: 12/02/20 10:00 PM   Specimen: Nasopharyngeal Swab; Nasopharyngeal(NP) swabs in vial transport medium  Result Value Ref Range Status   SARS Coronavirus 2 by RT PCR NEGATIVE NEGATIVE Final    Comment: (NOTE) SARS-CoV-2 target nucleic acids are NOT DETECTED.  The SARS-CoV-2 RNA is generally detectable in upper respiratory specimens during the acute  phase of infection. The lowest concentration of SARS-CoV-2 viral copies this assay can detect is 138 copies/mL. A negative result does not preclude SARS-Cov-2 infection and should not be used as the sole basis for treatment or other patient management decisions. A negative result may occur with  improper specimen collection/handling, submission of specimen other than nasopharyngeal swab, presence of viral mutation(s) within the areas targeted by this assay, and inadequate number of viral copies(<138 copies/mL). A negative result must be combined with clinical observations, patient history, and epidemiological information. The expected result is Negative.  Fact Sheet for Patients:  BloggerCourse.comhttps://www.fda.gov/media/152166/download  Fact Sheet for Healthcare Providers:  SeriousBroker.ithttps://www.fda.gov/media/152162/download  This test is no t yet approved or cleared by the [place] FDA and  has been authorized for detection and/or diagnosis of SARS-CoV-2 by FDA under an Emergency Use Authorization (EUA). This EUA will remain  in effect (meaning this test can be used) for the duration of the COVID-19 declaration under Section 564(b)(1) of the Act, 21 U.S.C.section 360bbb-3(b)(1), unless the authorization is terminated  or revoked sooner.       Influenza A by PCR NEGATIVE NEGATIVE Final   Influenza B by PCR NEGATIVE NEGATIVE Final    Comment: (NOTE) The Xpert Xpress SARS-CoV-2/FLU/RSV plus assay is intended as an aid in the diagnosis of influenza from Nasopharyngeal swab specimens and should not be used as a sole basis for treatment. Nasal washings and aspirates are unacceptable for Xpert Xpress SARS-CoV-2/FLU/RSV testing.  Fact Sheet for Patients: BloggerCourse.comhttps://www.fda.gov/media/152166/download  Fact Sheet for Healthcare Providers: SeriousBroker.ithttps://www.fda.gov/media/152162/download  This test is not yet approved or cleared by the [place] FDA and has been authorized for detection and/or diagnosis of  SARS-CoV-2 by FDA under an Emergency Use Authorization (EUA). This EUA will remain in effect (meaning this test can be used) for the duration of the COVID-19 declaration under Section 564(b)(1) of the Act, 21 U.S.C. section 360bbb-3(b)(1), unless the authorization is terminated or revoked.  Performed at [facility], [address] [place], [place] [address]   Blood culture (routine x 2)     Status: None (Preliminary result)   Collection Time: 12/02/20 10:20 PM   Specimen: BLOOD  Result Value Ref Range Status   Specimen Description BLOOD SITE NOT SPECIFIED  Final   Special Requests   Final  BOTTLES DRAWN AEROBIC AND ANAEROBIC Blood Culture results may not be optimal due to an inadequate volume of blood received in culture bottles   Culture   Final    NO GROWTH 2 DAYS Performed at [facility], [address] [place], [place] [address]    Report Status PENDING  Incomplete  Culture, Urine     Status: None   Collection Time: 12/03/20  6:43 PM   Specimen: Urine, Random  Result Value Ref Range Status   Specimen Description URINE, RANDOM  Final   Special Requests NONE  Final   Culture   Final    NO GROWTH Performed at [facility], [address], [place], [place] [address]    Report Status 12/05/2020 FINAL  Final      Radiology Studies: No results found.  Scheduled Meds: . feeding supplement  237 mL Oral BID BM  . folic acid  1 mg Oral Daily  . insulin aspart  0-9 Units Subcutaneous TID AC & HS  . lactulose  30 g Oral TID  . midodrine  5 mg Oral TID WC  . multivitamin with minerals  1 tablet Oral Daily  . octreotide  100 mcg Subcutaneous TID  . pantoprazole (PROTONIX) IV  40 mg Intravenous Q12H  . rifaximin  550 mg Oral BID  . sodium bicarbonate  1,300 mg Oral TID   Continuous Infusions: . potassium chloride 10 mEq (12/05/20 1427)     LOS: 3 days   Time spent: 45 minutes   Danya Spearman Estill Cotta, MD [facility]  If 7PM-7AM, please  contact night-coverage [email or web address] 12/05/2020, 2:59 PM

## 2020-12-05 NOTE — Progress Notes (Addendum)
[facility] Gastroenterology Progress Note  Shelby Conner 49 y.o. May 25, 1971  CC: Acute kidney injury, decompensated cirrhosis, encephalopathy   Subjective: Patient seen and examined at bedside.  More alert this morning.  No acute events overnight  ROS : Afebrile, negative for chest pain   Objective: Vital signs in last 24 hours: Vitals:   12/05/20 0431 12/05/20 0839  BP: 101/60 105/68  Pulse: 79 68  Resp: 19 16  Temp: 98 F (36.7 C) 98.6 F (37 C)  SpO2: 97% 99%    Physical Exam:  General:   Alert and oriented x2 resting comfortably in the bed.  Head:  Normocephalic, without obvious abnormality, atraumatic  Eyes:   Scleral icterus noted  Lungs:    No respiratory distress  Heart:  Regular rate and rhythm, S1, S2 normal  Abdomen:    Abdomen is mild distended, nontender, bowel sounds present.  No peritoneal signs  Extremities: no  edema       Lab Results: Recent Labs    12/03/20 0351 12/03/20 2228 12/04/20 0636 12/05/20 0207  NA 126*   < > 130* 129*  K 3.5   < > 2.2* 2.9*  CL 95*   < > 92* 93*  CO2 10*   < > 19* 17*  GLUCOSE 132*   < > 163* 135*  BUN 120*   < > 112* 104*  CREATININE 6.84*   < > 6.47* 5.83*  CALCIUM 8.0*   < > 9.1 9.2  MG 2.3  --  2.2  --   PHOS 6.6*  --   --   --    < > = values in this interval not displayed.   Recent Labs    12/04/20 0636 12/05/20 0207  AST 75* 80*  ALT 48* 45*  ALKPHOS 105 89  BILITOT 12.9* 11.3*  PROT 8.2* 7.2  ALBUMIN 3.6 3.1*   Recent Labs    12/03/20 0351 12/04/20 0636 12/05/20 0207  WBC 9.5 10.2 12.5*  NEUTROABS 7.4  --   --   HGB 7.6* 8.7* 8.2*  HCT 21.9* 23.7* 23.1*  MCV 106.8* 100.4* 104.1*  PLT 155  --  187   Recent Labs    12/04/20 0636 12/05/20 0207  LABPROT 23.6* 22.4*  INR 2.2* 2.1*      Assessment/Plan: -Acute kidney injury.  Most likely hepatorenal syndrome.  Patient did use some Advil PM in last few days.  Was on diuretics but it was on hold since November 25, 2020. -Decompensated cirrhosis. MELD score 40 on admission, meld score elevated because of acute kidney injury and coagulopathy. -Coagulopathy.  Was on Xarelto.  Probably worsening coagulopathy because of acute kidney injury reducing clearance of Xarelto. -Hepatic encephalopathy.   -Anemia.  Hemoglobin stable.  EGD last month was negative for varices.  It showed some esophagitis. -Protein C and protein S deficiency. -History of alcohol use.  Last use prior to previous admission.   Recommendations ------------------------- -Patient's kidney functions are improving slowly.  INR improved from 4.4 to 2.1. -Appreciate nephrology input.  Continue octreotide, midodrine and albumin. -Continue lactulose  and rifaximin -Advance diet to soft.  -Repeat CMP and INR in the morning. -Hold Xarelto -Avoiding vitamin K because of history of protein C and protein S deficiency.  Patient's hemoglobin is relatively stable. -Poor long-term prognosis -She has been referred to [facility] transplant center for an outpatient transplant evaluation.   Kathi Der MD, FACP 12/05/2020, 9:14 AM  Contact #  [phone]

## 2020-12-05 NOTE — Evaluation (Signed)
Physical Therapy Evaluation Patient Details Name: Shelby Conner MRN: [id number] DOB: 1971-11-29 Today's Date: 12/05/2020   History of Present Illness  Pt is a 49 year old female who presented to the hospital following recommendation from her PCP due to abnormal labs and that her renal function was markedly abnormal. She was found to have severe acute kidney injury and concern for heaptorenal syndrome. She recently was admitted to this hospital due to abdominal pain and was found to have markedly decompensated liver cirrhosis. Since her recent d/c from the hospital she has been feeling generalized weakness. PMH: fatty liver and DM.  Clinical Impression  Pt presents with condition above and deficits mentioned below, see PT Problem List. Prior to this admission, pt was getting set-up to receive [facility] PT and she was requiring supervision for all functional mobility with use of a RW. Per husband, pt is normally cognitively intact and not confused. However, this date pt was easily distracted and unable to recall names or ages of her children or current date. Pt demonstrates weakness and generalized coordination deficits in her legs that impact her safety with functional mobility. She has a tendency to lean posteriorly, impacting her balance. She required min guard for bed mob, minA for transfers, and minA to ambulate only ~8 ft with a RW before pt became fatigued and needed to sit. She also may have needed to sit due to her abdominal pain. Pt demonstrates poor sequencing of tasks even with multi-modal cues and requires PT to move the RW to get pt to advance gait. Will continue to follow acutely. Recommending pt follow-up with [facility] PT upon d/c to maximize her independence and safety with functional mobility.     Follow Up Recommendations Home health PT;Supervision for mobility/OOB    Equipment Recommendations  Other (comment) (shower chair)    Recommendations for Other Services       Precautions /  Restrictions Precautions Precautions: Fall Restrictions Weight Bearing Restrictions: No      Mobility  Bed Mobility Overal bed mobility: Needs Assistance Bed Mobility: Supine to Sit     Supine to sit: HOB elevated;Min guard     General bed mobility comments: Extra time and cues to manage legs off EOB and come to sit, with min guard for safety.    Transfers Overall transfer level: Needs assistance Equipment used: Rolling walker (2 wheeled) Transfers: Sit to/from Stand Sit to Stand: Min assist         General transfer comment: Attempted to cue pt to place hands on bed to push up to stand, but pt then returned hands to RW and quickly came to stand unexpectedly. Demonstrated mod trunk sway and posterior lean upon coming to stand, minA for safety.  Ambulation/Gait Ambulation/Gait assistance: Min assist Gait Distance (Feet): 8 Feet Assistive device: Rolling walker (2 wheeled) Gait Pattern/deviations: Step-through pattern;Decreased stride length;Shuffle;Leaning posteriorly Gait velocity: decreased Gait velocity interpretation: <1.31 ft/sec, indicative of household ambulator General Gait Details: Tendency to lean posteriorly, which improved with increased time in standing. To advance legs, therapist provided assistance to move RW with pt then following as she would become distracted and not move often when only verbally cued to ambulate. MinA to maintain safety and balance. Stopped due to reported fatigue and noted pain.  Stairs            Wheelchair Mobility    Modified Rankin (Stroke Patients Only)       Balance Overall balance assessment: Needs assistance Sitting-balance support: Bilateral upper extremity supported;Feet supported  Sitting balance-Leahy Scale: Poor Sitting balance - Comments: Static sitting EOB with supervision for safety and UE support. Postural control: Posterior lean Standing balance support: Bilateral upper extremity supported;During functional  activity Standing balance-Leahy Scale: Poor Standing balance comment: UE support on RW and minA to maintain balance due to posterior lean and trunk sway.                             Pertinent Vitals/Pain Pain Assessment: Faces Faces Pain Scale: Hurts whole lot Pain Location: abdomen Pain Descriptors / Indicators: Cramping;Grimacing;Moaning Pain Intervention(s): Limited activity within patient's tolerance;Monitored during session;Repositioned;Patient requesting pain meds-RN notified    Home Living Family/patient expects to be discharged to:: Private residence Living Arrangements: Spouse/significant other;Children (8 y/o and 63 y/o children) Available Help at Discharge: Available 24 hours/day;Family (mother-in-law available to help while father is at work, but only physically minimally due to her age) Type of Home: House Home Access: Stairs to enter Entrance Stairs-Rails: Can reach both Entrance Stairs-Number of Steps: 5 Home Layout: One level Home Equipment: Grab bars - tub/shower;Crutches;Walker - 2 wheels;Bedside commode Additional Comments: Husband works full-time    Prior Function Level of Independence: Needs Geologist, engineering / Transfers Assistance Needed: Prior to this admission, pt was requiring supervision with use of RW for ambulation and stair negotiation  ADL's / Homemaking Assistance Needed: Required some assistance for dressing and bathing prior to this admission.  Comments: Received home set-up and PLOF info from husband via phone as pt not reliable historian this date.     Hand Dominance   Dominant Hand: Right    Extremity/Trunk Assessment   Upper Extremity Assessment Upper Extremity Assessment: Defer to OT evaluation    Lower Extremity Assessment Lower Extremity Assessment: RLE deficits/detail;LLE deficits/detail RLE Deficits / Details: MMT scores of the following noted: hip flexion 3+, knee extension 4-, knee flexion 3+, ankle dorsiflexion  4- RLE Sensation:  (denied numbness/tingling in leg) RLE Coordination: decreased gross motor LLE Deficits / Details: MMT scores of the following noted: hip flexion 3+, knee extension 4-, knee flexion 3+, ankle dorsiflexion 4- LLE Sensation:  (denied numbness/tingling in leg) LLE Coordination: decreased gross motor    Cervical / Trunk Assessment Cervical / Trunk Assessment: Normal  Communication   Communication: No difficulties  Cognition Arousal/Alertness: Awake/alert Behavior During Therapy: WFL for tasks assessed/performed Overall Cognitive Status: Impaired/Different from baseline Area of Impairment: Orientation;Attention;Memory;Following commands;Safety/judgement;Awareness;Problem solving                 Orientation Level: Disoriented to;Time (Stated "November 2012" for current date.) Current Attention Level: Selective Memory: Decreased short-term memory Following Commands: Follows one step commands inconsistently;Follows one step commands with increased time Safety/Judgement: Decreased awareness of safety;Decreased awareness of deficits Awareness: Emergent Problem Solving: Difficulty sequencing;Requires verbal cues;Slow processing;Decreased initiation;Requires tactile cues General Comments: Required repetition of questions due to pt losing attention or asking "hm?" repeatedly. Required continual cues and direction to manage RW and sequence steps to maintain safety with mobility.      General Comments      Exercises     Assessment/Plan    PT Assessment Patient needs continued PT services  PT Problem List Decreased strength;Decreased activity tolerance;Decreased balance;Decreased mobility;Decreased coordination;Decreased cognition;Decreased knowledge of use of DME;Decreased safety awareness;Pain       PT Treatment Interventions DME instruction;Gait training;Stair training;Functional mobility training;Therapeutic activities;Therapeutic exercise;Balance  training;Patient/family education;Neuromuscular re-education;Cognitive remediation    PT Goals (Current goals can be found in the  Care Plan section)  Acute Rehab PT Goals Patient Stated Goal: to go home and receive HH PT PT Goal Formulation: With patient/family Time For Goal Achievement: 12/19/20 Potential to Achieve Goals: Good    Frequency Min 3X/week   Barriers to discharge        Co-evaluation               AM-PAC PT "6 Clicks" Mobility  Outcome Measure Help needed turning from your back to your side while in a flat bed without using bedrails?: A Little Help needed moving from lying on your back to sitting on the side of a flat bed without using bedrails?: A Little Help needed moving to and from a bed to a chair (including a wheelchair)?: A Little Help needed standing up from a chair using your arms (e.g., wheelchair or bedside chair)?: A Little Help needed to walk in hospital room?: A Little Help needed climbing 3-5 steps with a railing? : A Lot 6 Click Score: 17    End of Session Equipment Utilized During Treatment: Gait belt Activity Tolerance: Patient limited by fatigue;Patient limited by pain Patient left: in chair;with call bell/phone within reach;with chair alarm set Nurse Communication: Mobility status;Patient requests pain meds PT Visit Diagnosis: Unsteadiness on feet (R26.81);Other abnormalities of gait and mobility (R26.89);Muscle weakness (generalized) (M62.81);Difficulty in walking, not elsewhere classified (R26.2);Pain Pain - Right/Left:  (abdomen) Pain - part of body:  (abdomen)    Time: 3546-5681 PT Time Calculation (min) (ACUTE ONLY): 27 min   Charges:   PT Evaluation $PT Eval Moderate Complexity: 1 Mod PT Treatments $Therapeutic Activity: 8-22 mins        Raymond Gurney, PT, DPT Acute Rehabilitation Services  Pager: [phone] Office: [phone]   Jewel Baize 12/05/2020, 2:26 PM

## 2020-12-06 LAB — CBC
HCT: 22.8 % — ABNORMAL LOW (ref 36.0–46.0)
Hemoglobin: 8 g/dL — ABNORMAL LOW (ref 12.0–15.0)
MCH: 36.5 pg — ABNORMAL HIGH (ref 26.0–34.0)
MCHC: 35.1 g/dL (ref 30.0–36.0)
MCV: 104.1 fL — ABNORMAL HIGH (ref 80.0–100.0)
Platelets: 155 10*3/uL (ref 150–400)
RBC: 2.19 MIL/uL — ABNORMAL LOW (ref 3.87–5.11)
RDW: 22.5 % — ABNORMAL HIGH (ref 11.5–15.5)
WBC: 12.7 10*3/uL — ABNORMAL HIGH (ref 4.0–10.5)
nRBC: 0 % (ref 0.0–0.2)

## 2020-12-06 LAB — MAGNESIUM: Magnesium: 1.8 mg/dL (ref 1.7–2.4)

## 2020-12-06 LAB — COMPREHENSIVE METABOLIC PANEL
ALT: 50 U/L — ABNORMAL HIGH (ref 0–44)
AST: 80 U/L — ABNORMAL HIGH (ref 15–41)
Albumin: 3 g/dL — ABNORMAL LOW (ref 3.5–5.0)
Alkaline Phosphatase: 118 U/L (ref 38–126)
Anion gap: 16 — ABNORMAL HIGH (ref 5–15)
BUN: 98 mg/dL — ABNORMAL HIGH (ref 6–20)
CO2: 20 mmol/L — ABNORMAL LOW (ref 22–32)
Calcium: 9.5 mg/dL (ref 8.9–10.3)
Chloride: 97 mmol/L — ABNORMAL LOW (ref 98–111)
Creatinine, Ser: 5.18 mg/dL — ABNORMAL HIGH (ref 0.44–1.00)
GFR, Estimated: 10 mL/min — ABNORMAL LOW (ref >60)
Glucose, Bld: 157 mg/dL — ABNORMAL HIGH (ref 70–99)
Potassium: 3.3 mmol/L — ABNORMAL LOW (ref 3.5–5.1)
Sodium: 133 mmol/L — ABNORMAL LOW (ref 135–145)
Total Bilirubin: 11.2 mg/dL — ABNORMAL HIGH (ref 0.3–1.2)
Total Protein: 7.3 g/dL (ref 6.5–8.1)

## 2020-12-06 LAB — GLUCOSE, CAPILLARY
Glucose-Capillary: 147 mg/dL — ABNORMAL HIGH (ref 70–99)
Glucose-Capillary: 127 mg/dL — ABNORMAL HIGH (ref 70–99)
Glucose-Capillary: 155 mg/dL — ABNORMAL HIGH (ref 70–99)
Glucose-Capillary: 126 mg/dL — ABNORMAL HIGH (ref 70–99)

## 2020-12-06 LAB — AMMONIA: Ammonia: 169 umol/L — ABNORMAL HIGH (ref 9–35)

## 2020-12-06 LAB — URINE CULTURE

## 2020-12-06 LAB — PROTIME-INR
INR: 2 — ABNORMAL HIGH (ref 0.8–1.2)
Prothrombin Time: 22.3 seconds — ABNORMAL HIGH (ref 11.4–15.2)

## 2020-12-06 MED ORDER — PANTOPRAZOLE SODIUM 40 MG PO PACK
40.0000 mg | PACK | Freq: Two times a day (BID) | ORAL | Status: DC
  Administered 2020-12-07 – 2020-12-11 (×7): 40 mg via ORAL
  Filled 2020-12-06 (×12): qty 20

## 2020-12-06 MED ORDER — POTASSIUM CHLORIDE 20 MEQ PO PACK
40.0000 meq | PACK | Freq: Once | ORAL | Status: AC
  Administered 2020-12-06: 18:00:00 40 meq via ORAL
  Filled 2020-12-06: qty 2

## 2020-12-06 NOTE — Progress Notes (Signed)
[facility] Progress Note  Shelby Conner 49 y.o. [birth year]   Subjective: Lying in fetal position and slow to respond to questions. Not oriented to time or person (does not know her date of birth) and for what year it is says "18 and then 2002." Mother-in-law in room. Patient's husband called but no answer.  Objective: Vital signs: Vitals:   12/06/20 0525 12/06/20 0807  BP: 110/68 112/75  Pulse: 61 85  Resp: 13 15  Temp: 98.5 F (36.9 C) 97.6 F (36.4 C)  SpO2: 100% 99%    Physical Exam: Gen: lethargic, well-nourished, no acute distress  HEENT: +icteric sclera CV: RRR Chest: CTA B Abd: diffuse tenderness with guarding, mild distention, +BS Ext: no edema  Lab Results: Recent Labs    12/04/20 0636 12/05/20 0207 12/05/20 1031  NA 130* 129* 130*  K 2.2* 2.9* 3.5  CL 92* 93* 94*  CO2 19* 17* 15*  GLUCOSE 163* 135* 130*  BUN 112* 104* 103*  CREATININE 6.47* 5.83* 5.79*  CALCIUM 9.1 9.2 9.3  MG 2.2  --   --    Recent Labs    12/05/20 0207 12/05/20 1031  AST 80* 92*  ALT 45* 51*  ALKPHOS 89 104  BILITOT 11.3* 11.7*  PROT 7.2 7.4  ALBUMIN 3.1* 3.2*   Recent Labs    12/04/20 0636 12/05/20 0207  WBC 10.2 12.5*  HGB 8.7* 8.2*  HCT 23.7* 23.1*  MCV 100.4* 104.1*  PLT  --  187      Assessment/Plan: Decompensated alcoholic cirrhosis with hepatic encephalopathy and acute kidney injury concerning for hepatorenal syndrome. Appreciate nephrology following. Serum Cr slowly improving on Octreotide, Midodrine, and Albumin. Continue Lactulose. Reportedly has an outpt referral at [facility] for liver transplant evaluation but unclear whether she would be considered a candidate. Unclear if she is still drinking alcohol prior to this admit and if she is still drinking alcohol then that would prevent moving forward with a transplant evaluation (but defer to [facility]). Her renal failure also complicates any liver transplant evaluation. Continue supportive  care.   Shirley Friar 12/06/2020, 9:56 AM  Questions please call [phone] ID: Shelby Conner, female   DOB: 1971/10/08, 49 y.o.   MRN: [id number]

## 2020-12-06 NOTE — Progress Notes (Signed)
PHARMACIST - PHYSICIAN COMMUNICATION  DR:   Jacqulyn Bath  CONCERNING: IV to Oral Route Change Policy  RECOMMENDATION: This patient is receiving pantoprazole by the intravenous route.  Based on criteria approved by the Pharmacy and Therapeutics Committee, the intravenous medication(s) is/are being converted to the equivalent oral dose form(s).   DESCRIPTION: These criteria include:  The patient is eating (either orally or via tube) and/or has been taking other orally administered medications for a least 24 hours  The patient has no evidence of active gastrointestinal bleeding or impaired GI absorption (gastrectomy, short bowel, patient on TNA or NPO).  If you have questions about this conversion, please contact the Pharmacy Department  []   [phone] )  ( [phone] []   [phone] )  [facility] [x]   [phone] )  [facility] []   [phone] )  [facility] []   [phone] )  [facility]

## 2020-12-06 NOTE — Evaluation (Signed)
Occupational Therapy Evaluation Patient Details Name: Shelby Conner MRN: [id number] DOB: 02/02/1971 Today's Date: 12/06/2020    History of Present Illness Pt is a 49 year old female who presented to the hospital following recommendation from her PCP due to abnormal labs and that her renal function was markedly abnormal. She was found to have severe acute kidney injury and concern for heaptorenal syndrome. She recently was admitted to this hospital due to abdominal pain and was found to have markedly decompensated liver cirrhosis. Since her recent d/c from the hospital she has been feeling generalized weakness. PMH: fatty liver and DM.   Clinical Impression   Limited evaluation, pt refusing OOB to bathroom or chair. Educated in benefits. Pt presents with impaired cognition and generalized weakness. She requires supervision to moderate assistance for ADL. Recommending home if family can provide 24 hour care and physical assistance, otherwise SNF. Will follow acutely.    Follow Up Recommendations  Home health OT;Supervision/Assistance - 24 hour    Equipment Recommendations  None recommended by OT    Recommendations for Other Services       Precautions / Restrictions Precautions Precautions: Fall Restrictions Weight Bearing Restrictions: No      Mobility Bed Mobility Overal bed mobility: Needs Assistance Bed Mobility: Supine to Sit;Sit to Supine     Supine to sit: HOB elevated;Supervision Sit to supine: Supervision;HOB elevated        Transfers                 General transfer comment: pt declined OOB    Balance Overall balance assessment: Needs assistance   Sitting balance-Leahy Scale: Fair Sitting balance - Comments: supervision for safety                                   ADL either performed or assessed with clinical judgement   ADL Overall ADL's : Needs assistance/impaired Eating/Feeding: Set up;Sitting   Grooming: Sitting;Set  up;Supervision/safety   Upper Body Bathing: Moderate assistance;Sitting   Lower Body Bathing: Moderate assistance;Sit to/from stand   Upper Body Dressing : Minimal assistance;Sitting   Lower Body Dressing: Moderate assistance;Sit to/from stand                 General ADL Comments: pt declined OOB     Vision Baseline Vision/History: Wears glasses Wears Glasses: Reading only Patient Visual Report: No change from baseline       Perception     Praxis      Pertinent Vitals/Pain Pain Assessment: Faces Faces Pain Scale: Hurts even more Pain Location: abdomen Pain Descriptors / Indicators: Grimacing;Guarding;Moaning Pain Intervention(s): Monitored during session;Repositioned     Hand Dominance Right   Extremity/Trunk Assessment Upper Extremity Assessment Upper Extremity Assessment: Generalized weakness   Lower Extremity Assessment Lower Extremity Assessment: Defer to PT evaluation   Cervical / Trunk Assessment Cervical / Trunk Assessment: Normal   Communication Communication Communication: Expressive difficulties (slow response speed, impaired word finding)   Cognition Arousal/Alertness: Awake/alert Behavior During Therapy: Flat affect Overall Cognitive Status: Impaired/Different from baseline Area of Impairment: Orientation;Attention;Memory;Following commands;Safety/judgement;Awareness;Problem solving                 Orientation Level: Disoriented to;Time;Situation Current Attention Level: Selective Memory: Decreased short-term memory Following Commands: Follows one step commands inconsistently;Follows one step commands with increased time Safety/Judgement: Decreased awareness of safety;Decreased awareness of deficits Awareness: Emergent Problem Solving: Difficulty sequencing;Requires verbal cues;Slow processing;Decreased initiation;Requires tactile cues  General Comments       Exercises     Shoulder Instructions      Home Living  Family/patient expects to be discharged to:: Private residence Living Arrangements: Spouse/significant other;Children Available Help at Discharge: Available 24 hours/day;Family (mother in law can provide limited physical assist) Type of Home: House Home Access: Stairs to enter [employer] of Steps: 5 Entrance Stairs-Rails: Can reach both Home Layout: One level     Bathroom Shower/Tub: Chief Strategy Officer: Standard     Home Equipment: Grab bars - tub/shower;Crutches;Walker - 2 wheels;Bedside commode;Shower seat   Additional Comments: Husband works full-time, mother in Social worker in room, but vague when asked whether she will be able to help at home.      Prior Functioning/Environment Level of Independence: Needs assistance  Gait / Transfers Assistance Needed: Prior to this admission, pt was requiring supervision with use of RW for ambulation and stair negotiation ADL's / Homemaking Assistance Needed: Required some assistance for dressing and bathing prior to this admission.   Comments: pt is a poor historian, information gathered from family        OT Problem List: Decreased strength;Decreased activity tolerance;Impaired balance (sitting and/or standing);Decreased knowledge of use of DME or AE;Pain;Decreased cognition;Decreased safety awareness      OT Treatment/Interventions: Self-care/ADL training;Energy conservation;DME and/or AE instruction;Therapeutic activities;Balance training;Patient/family education;Cognitive remediation/compensation    OT Goals(Current goals can be found in the care plan section) Acute Rehab OT Goals Patient Stated Goal: stay in bed OT Goal Formulation: Patient unable to participate in goal setting Time For Goal Achievement: 12/20/20 Potential to Achieve Goals: Fair ADL Goals Pt Will Perform Grooming: with supervision;standing Pt Will Perform Upper Body Dressing: with supervision;sitting Pt Will Perform Lower Body Dressing: with  supervision;sit to/from stand Pt Will Transfer to Toilet: with supervision;ambulating;regular height toilet Pt Will Perform Toileting - Clothing Manipulation and hygiene: with supervision;sit to/from stand  OT Frequency: Min 2X/week   Barriers to D/C:            Co-evaluation              AM-PAC OT "6 Clicks" Daily Activity     Outcome Measure Help from another person eating meals?: A Little Help from another person taking care of personal grooming?: A Little Help from another person toileting, which includes using toliet, bedpan, or urinal?: A Little Help from another person bathing (including washing, rinsing, drying)?: A Lot Help from another person to put on and taking off regular upper body clothing?: A Little Help from another person to put on and taking off regular lower body clothing?: A Lot 6 Click Score: 16   End of Session    Activity Tolerance: Patient limited by fatigue Patient left: in bed;with call bell/phone within reach;with bed alarm set;with family/visitor present  OT Visit Diagnosis: Unsteadiness on feet (R26.81);Other abnormalities of gait and mobility (R26.89);Muscle weakness (generalized) (M62.81);Pain;Other symptoms and signs involving cognitive function                Time: 9628-3662 OT Time Calculation (min): 14 min Charges:  OT General Charges $OT Visit: 1 Visit OT Evaluation $OT Eval Moderate Complexity: 1 Mod  Martie Round, OTR/L Acute Rehabilitation Services Pager: [phone] Office: [phone]  Evern Bio 12/06/2020, 10:32 AM

## 2020-12-06 NOTE — Progress Notes (Signed)
Patient ID: Shelby Conner, female   DOB: 15-Oct-1971, 49 y.o.   MRN: [id number] [facility] Progress Note   Assessment/ Plan:   1. Acute kidney Injury: Secondary to hepatorenal syndrome type I based on timeline of events/available data.  On supportive management with albumin, midodrine and octreotide-remains charted as anuric (questionable accuracy) with some improvement of renal function seen on labs.  Overall prognosis is guarded given marginal candidacy for liver transplantation-unclear when she had her last alcoholic drink. 2.  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  Unclear status with regards to liver transplant evaluation at [facility]. 3.  Hyponatremia: Secondary to cirrhosis/AKI and impaired free water handling.  Supportive management. 4.  Anion gap metabolic acidosis: Secondary to acute kidney injury and impaired hepatic function, supportive management at this time to limit sodium loading with bicarbonate replacement. 5.  Hypokalemia: Corrected with oral replacement, imperative to supplement further to limit worsening hepatic encephalopathy.  Subjective:   Reports to be "fine" when asked.  Mother-in-law at bedside says that she has been groaning with abdominal pain.  Spoke with husband Greig Castilla) over the phone to update him of current events.   Objective:   BP 115/83 (BP Location: Left Arm)    Pulse 78    Temp 98.6 F (37 C) (Oral)    Resp 18    Ht 5\' 2"  (1.575 m)    Wt 59 kg    SpO2 98%    BMI 23.78 kg/m   Intake/Output Summary (Last 24 hours) at 12/06/2020 1220 Last data filed at 12/06/2020 1151 Gross per 24 hour  Intake 594.2 ml  Output 100 ml  Net 494.2 ml   Weight change:   Physical Exam: Gen: Resting on right lateral side in fetal position. CVS: Pulse regular rhythm, normal rate, S1 and S2 with ejection systolic murmur Resp: Diminished breath sounds over bases, no distinct rales or rhonchi Abd: Soft, obese, tender over  lower quadrants, bowel sounds normal Ext: Trace ankle edema  Imaging: No results found.  Labs: BMET Recent Labs  Lab 12/02/20 1444 12/02/20 1832 12/03/20 0351 12/03/20 2228 12/04/20 0636 12/05/20 0207 12/05/20 1031  NA 124* 127* 126* 124* 130* 129* 130*  K 3.0* 2.5* 3.5 2.8* 2.2* 2.9* 3.5  CL 93*  --  95* 92* 92* 93* 94*  CO2 8*  --  10* 9* 19* 17* 15*  GLUCOSE 88  --  132* 155* 163* 135* 130*  BUN 124*  --  120* 113* 112* 104* 103*  CREATININE 7.45*  --  6.84* 6.78* 6.47* 5.83* 5.79*  CALCIUM 8.9  --  8.0* 8.9 9.1 9.2 9.3  PHOS  --   --  6.6*  --   --   --   --    CBC Recent Labs  Lab 12/02/20 1444 12/02/20 1832 12/03/20 0351 12/04/20 0636 12/05/20 0207  WBC 17.0*  --  9.5 10.2 12.5*  NEUTROABS  --   --  7.4  --   --   HGB 10.0* 9.9* 7.6* 8.7* 8.2*  HCT 27.8* 29.0* 21.9* 23.7* 23.1*  MCV 108.2*  --  106.8* 100.4* 104.1*  PLT 209  --  155  --  187    Medications:     feeding supplement  237 mL Oral BID BM   folic acid  1 mg Oral Daily   insulin aspart  0-9 Units Subcutaneous TID AC & HS   lactulose  30 g Oral TID   midodrine  5  mg Oral TID WC   multivitamin with minerals  1 tablet Oral Daily   octreotide  100 mcg Subcutaneous TID   pantoprazole sodium  40 mg Oral BID   rifaximin  550 mg Oral BID   sodium bicarbonate  1,300 mg Oral TID   Zetta Bills, MD 12/06/2020, 12:20 PM

## 2020-12-06 NOTE — Progress Notes (Signed)
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  Upon arrival to ED: Patient was found to have severe AKI with creatinine of 7.45, BUN: 124, calcium: 8.9, elevated AST of 100, ALT: 69.  Elevated bilirubin of 17.1 with slightly elevated alkaline phosphatase of 135.  WBC: 7.0, hemoglobin: 10.0, platelet: 209, INR: 4.3, ammonia: 143.  Nephrology and GI consulted.  Patient admitted for further evaluation and management of worsening kidney function in the setting of decompensated cirrhosis.  Assessment & Plan:  AKI: -Likely hepatorenal syndrome type 1 in the setting of decompensated liver cirrhosis. -On admission: BUN 124, creatinine: 7.45, GFR: 6  -Ultrasound: Negative for acute findings.  Continue to avoid nephrotoxic medication including NSAIDs.  Hold Lasix and Aldactone. -Continue albumin, midodrine, octreotide -Strict INO's and monitor signs of fluid overload.   -Appreciate nephrology help  Decompensated liver cirrhosis: -LFTs: Improving, AST: 100--> 75--> 80, ALT: 69--> 48--> 45, bilirubin: 17.1--> 12.9--> 11.3, alkaline phosphatase: 135--> 105--> 89.  -She has been referred to [facility] transplant center for outpatient transplant evaluation-unclear status at this time. -Appreciate GI help.  Hepatic encephalopathy: Ammonia: 143 on admission trended down to 83-81. -Ammonia level trended up to 106. -Continue scheduled lactulose and rifaximin. -Advance diet to soft.  Coagulopathy: On admission INR elevated at 4.3 trended down to 2.2 to 2.1.  Continue to hold Xarelto.     Anion gap metabolic acidosis: Likely in the setting of worsening kidney function, bicarb 8, anion gap 23 on admission.  Improving -Received IV sodium bicarb on admission, continue p.o. sodium bicarb-as per nephrology recommendations.  Hypothermia: Resolved.  TSH: WNL. -Urine culture: No growth.  History of protein C and protein S deficiency: On chronic Xarelto at home.  Continue to hold Xarelto.  Macrocytic  anemia: H&H: Remained stable.  EGD last month was negative for varices it shows esophagitis.  Monitor H&H closely and transfuse as needed.  Hyponatremia: 124 on admission.  Improving -Patient was given IV fluids in the ED.  Severe hypokalemia: Potassium 2.2 improved to 2.9.   -Repeat BMP is pending  Grade C esophagitis: -Reviewed EGD from 11/23.  Continue Protonix 40 p.o. twice daily  Type 2 diabetes mellitus: Hold Metformin.  Blood glucose elevated likely in the setting of sodium bicarb GTT.  Continue sliding scale insulin.  Monitor blood sugar closely.  Leukocytosis: Resolved  History of alcohol abuse: Patient's husband tells me that patient's has quit alcohol use.  Prognosis: Overall poor prognosis-family is aware of that.  All labs are pending this morning.  DVT prophylaxis: SCD Code Status: Full code-confirmed with patient's husband Family Communication: Patient's mother-in-law present at bedside.  Plan of care discussed with patient in length and she verbalized understanding and agreed with it.  Disposition Plan: To be determined  Consultants:   Nephrology  GI  Procedures:   None  Antimicrobials:   None  Status is: Inpatient  Dispo: The patient is from: Home              Anticipated d/c is to: Home              Anticipated d/c date is: 3 days  Patient currently is not medically stable to d/c.   Subjective: Patient seen and examined.  Sleeping comfortably.  Patient's mother-in-law at bedside.  No acute events overnight.  Remained  afebrile.  Objective: Vitals:   12/05/20 2331 12/06/20 0525 12/06/20 0807 12/06/20 1137  BP: 117/73 110/68 112/75 115/83  Pulse: 68 61 85 78  Resp: 16 13 15 18   Temp: 98.6 F (37 C) 98.5 F (36.9 C) 97.6 F (36.4 C) 98.6 F (37 C)  TempSrc: Oral Oral Oral Oral  SpO2: 98% 100% 99% 98%  Weight:      Height:        Intake/Output Summary (Last 24 hours) at 12/06/2020 1242 Last data filed at 12/06/2020 1151 Gross per 24 hour  Intake 594.2 ml  Output 100 ml  Net 494.2 ml   Filed Weights   12/03/20 0625  Weight: 59 kg    Examination: General exam: Appears calm and comfortable, sleeping, on room air, icteric   Respiratory system: Clear to auscultation. Respiratory effort normal. Cardiovascular system: S1 & S2 heard, RRR. No JVD, murmurs, rubs, gallops or clicks. No pedal edema. Gastrointestinal system: Abdomen is mild distended, nontender on palpation, bowel sounds positive. Central nervous system: Sleeping comfortably. skin: Icteric    Data Reviewed: I have personally reviewed following labs and imaging studies  CBC: Recent Labs  Lab 12/02/20 1444 12/02/20 1832 12/03/20 0351 12/04/20 0636 12/05/20 0207  WBC 17.0*  --  9.5 10.2 12.5*  NEUTROABS  --   --  7.4  --   --   HGB 10.0* 9.9* 7.6* 8.7* 8.2*  HCT 27.8* 29.0* 21.9* 23.7* 23.1*  MCV 108.2*  --  106.8* 100.4* 104.1*  PLT 209  --  155  --  187   Basic Metabolic Panel: Recent Labs  Lab 12/02/20 2147 12/03/20 0351 12/03/20 2228 12/04/20 0636 12/05/20 0207 12/05/20 1031  NA  --  126* 124* 130* 129* 130*  K  --  3.5 2.8* 2.2* 2.9* 3.5  CL  --  95* 92* 92* 93* 94*  CO2  --  10* 9* 19* 17* 15*  GLUCOSE  --  132* 155* 163* 135* 130*  BUN  --  120* 113* 112* 104* 103*  CREATININE  --  6.84* 6.78* 6.47* 5.83* 5.79*  CALCIUM  --  8.0* 8.9 9.1 9.2 9.3  MG 1.8 2.3  --  2.2  --   --   PHOS  --  6.6*  --   --   --   --    GFR: Estimated Creatinine Clearance: 9.5 mL/min (A) (by C-G formula based on SCr of  5.79 mg/dL (H)). Liver Function Tests: Recent Labs  Lab 12/02/20 1444 12/03/20 0351 12/04/20 0636 12/05/20 0207 12/05/20 1031  AST 100* 82* 75* 80* 92*  ALT 69* 49* 48* 45* 51*  ALKPHOS 135* 100 105 89 104  BILITOT 17.1* 11.9* 12.9* 11.3* 11.7*  PROT 9.3* 7.2 8.2* 7.2 7.4  ALBUMIN 2.7* 2.5* 3.6 3.1* 3.2*   No results for input(s): LIPASE, AMYLASE in the last 168 hours. Recent Labs  Lab 12/02/20 1820 12/03/20 2228 12/04/20 0636 12/05/20 0207  AMMONIA 143* 83* 81* 106*   Coagulation Profile: Recent Labs  Lab 12/02/20 1743 12/03/20 0351 12/04/20 0636 12/05/20 0207  INR 4.3* 4.4* 2.2* 2.1*   Cardiac Enzymes: No results for input(s): CKTOTAL, CKMB, CKMBINDEX, TROPONINI in the last 168 hours. BNP (last 3 results) No results for input(s): PROBNP in the last 8760 hours. HbA1C: No results for input(s):  HGBA1C in the last 72 hours. CBG: Recent Labs  Lab 12/05/20 1149 12/05/20 1615 12/05/20 2138 12/06/20 0611 12/06/20 1135  GLUCAP 173* 133* 144* 147* 127*   Lipid Profile: No results for input(s): CHOL, HDL, LDLCALC, TRIG, CHOLHDL, LDLDIRECT in the last 72 hours. Thyroid Function Tests: Recent Labs    12/03/20 2228  TSH 2.739   Anemia Panel: No results for input(s): VITAMINB12, FOLATE, FERRITIN, TIBC, IRON, RETICCTPCT in the last 72 hours. Sepsis Labs: Recent Labs  Lab 12/02/20 2147  LATICACIDVEN 1.9    Recent Results (from the past 240 hour(s))  Resp Panel by RT-PCR (Flu A&B, Covid) Nasopharyngeal Swab     Status: None   Collection Time: 12/02/20 10:00 PM   Specimen: Nasopharyngeal Swab; Nasopharyngeal(NP) swabs in vial transport medium  Result Value Ref Range Status   SARS Coronavirus 2 by RT PCR NEGATIVE NEGATIVE Final    Comment: (NOTE) SARS-CoV-2 target nucleic acids are NOT DETECTED.  The SARS-CoV-2 RNA is generally detectable in upper respiratory specimens during the acute phase of infection. The lowest concentration of SARS-CoV-2 viral copies  this assay can detect is 138 copies/mL. A negative result does not preclude SARS-Cov-2 infection and should not be used as the sole basis for treatment or other patient management decisions. A negative result may occur with  improper specimen collection/handling, submission of specimen other than nasopharyngeal swab, presence of viral mutation(s) within the areas targeted by this assay, and inadequate number of viral copies(<138 copies/mL). A negative result must be combined with clinical observations, patient history, and epidemiological information. The expected result is Negative.  Fact Sheet for Patients:  BloggerCourse.comhttps://www.fda.gov/media/152166/download  Fact Sheet for Healthcare Providers:  SeriousBroker.ithttps://www.fda.gov/media/152162/download  This test is no t yet approved or cleared by the [place] FDA and  has been authorized for detection and/or diagnosis of SARS-CoV-2 by FDA under an Emergency Use Authorization (EUA). This EUA will remain  in effect (meaning this test can be used) for the duration of the COVID-19 declaration under Section 564(b)(1) of the Act, 21 U.S.C.section 360bbb-3(b)(1), unless the authorization is terminated  or revoked sooner.       Influenza A by PCR NEGATIVE NEGATIVE Final   Influenza B by PCR NEGATIVE NEGATIVE Final    Comment: (NOTE) The Xpert Xpress SARS-CoV-2/FLU/RSV plus assay is intended as an aid in the diagnosis of influenza from Nasopharyngeal swab specimens and should not be used as a sole basis for treatment. Nasal washings and aspirates are unacceptable for Xpert Xpress SARS-CoV-2/FLU/RSV testing.  Fact Sheet for Patients: BloggerCourse.comhttps://www.fda.gov/media/152166/download  Fact Sheet for Healthcare Providers: SeriousBroker.ithttps://www.fda.gov/media/152162/download  This test is not yet approved or cleared by the [place] FDA and has been authorized for detection and/or diagnosis of SARS-CoV-2 by FDA under an Emergency Use Authorization (EUA). This EUA  will remain in effect (meaning this test can be used) for the duration of the COVID-19 declaration under Section 564(b)(1) of the Act, 21 U.S.C. section 360bbb-3(b)(1), unless the authorization is terminated or revoked.  Performed at [facility], [address] [place], [place] [address]   Blood culture (routine x 2)     Status: None (Preliminary result)   Collection Time: 12/02/20 10:20 PM   Specimen: BLOOD  Result Value Ref Range Status   Specimen Description BLOOD SITE NOT SPECIFIED  Final   Special Requests   Final    BOTTLES DRAWN AEROBIC AND ANAEROBIC Blood Culture results may not be optimal due to an inadequate volume of blood received in culture bottles  Culture   Final    NO GROWTH 3 DAYS Performed at [facility], [address], [place], [place] [address]    Report Status PENDING  Incomplete  Culture, Urine     Status: None   Collection Time: 12/03/20  6:43 PM   Specimen: Urine, Random  Result Value Ref Range Status   Specimen Description URINE, RANDOM  Final   Special Requests NONE  Final   Culture   Final    NO GROWTH Performed at [facility], [address] [place], [place] [address]    Report Status 12/05/2020 FINAL  Final  Culture, Urine     Status: None (Preliminary result)   Collection Time: 12/05/20 10:40 AM   Specimen: Urine, Clean Catch  Result Value Ref Range Status   Specimen Description URINE, CLEAN CATCH  Final   Special Requests NONE  Final   Culture   Final    CULTURE REINCUBATED FOR BETTER GROWTH Performed at [facility], [address], [place], [place] [address]    Report Status PENDING  Incomplete      Radiology Studies: No results found.  Scheduled Meds: . feeding supplement  237 mL Oral BID BM  . folic acid  1 mg Oral Daily  . insulin aspart  0-9 Units Subcutaneous TID AC & HS  . lactulose  30 g Oral TID  . midodrine  5 mg Oral TID WC  . multivitamin with minerals  1 tablet Oral Daily  . octreotide   100 mcg Subcutaneous TID  . pantoprazole sodium  40 mg Oral BID  . rifaximin  550 mg Oral BID  . sodium bicarbonate  1,300 mg Oral TID   Continuous Infusions:    LOS: 4 days   Time spent: 45 minutes   Montoya Watkin Estill Cotta, MD [facility]  If 7PM-7AM, please contact night-coverage [email or web address] 12/06/2020, 12:42 PM

## 2020-12-07 LAB — COMPREHENSIVE METABOLIC PANEL
ALT: 44 U/L (ref 0–44)
AST: 71 U/L — ABNORMAL HIGH (ref 15–41)
Albumin: 2.9 g/dL — ABNORMAL LOW (ref 3.5–5.0)
Alkaline Phosphatase: 121 U/L (ref 38–126)
Anion gap: 15 (ref 5–15)
BUN: 99 mg/dL — ABNORMAL HIGH (ref 6–20)
CO2: 22 mmol/L (ref 22–32)
Calcium: 9.8 mg/dL (ref 8.9–10.3)
Chloride: 99 mmol/L (ref 98–111)
Creatinine, Ser: 4.91 mg/dL — ABNORMAL HIGH (ref 0.44–1.00)
GFR, Estimated: 10 mL/min — ABNORMAL LOW (ref >60)
Glucose, Bld: 121 mg/dL — ABNORMAL HIGH (ref 70–99)
Potassium: 3.5 mmol/L (ref 3.5–5.1)
Sodium: 136 mmol/L (ref 135–145)
Total Bilirubin: 10.2 mg/dL — ABNORMAL HIGH (ref 0.3–1.2)
Total Protein: 7.3 g/dL (ref 6.5–8.1)

## 2020-12-07 LAB — CBC
HCT: 22.4 % — ABNORMAL LOW (ref 36.0–46.0)
Hemoglobin: 7.9 g/dL — ABNORMAL LOW (ref 12.0–15.0)
MCH: 36.7 pg — ABNORMAL HIGH (ref 26.0–34.0)
MCHC: 35.3 g/dL (ref 30.0–36.0)
MCV: 104.2 fL — ABNORMAL HIGH (ref 80.0–100.0)
Platelets: 164 10*3/uL (ref 150–400)
RBC: 2.15 MIL/uL — ABNORMAL LOW (ref 3.87–5.11)
RDW: 22.7 % — ABNORMAL HIGH (ref 11.5–15.5)
WBC: 12.4 10*3/uL — ABNORMAL HIGH (ref 4.0–10.5)
nRBC: 0 % (ref 0.0–0.2)

## 2020-12-07 LAB — GLUCOSE, CAPILLARY
Glucose-Capillary: 106 mg/dL — ABNORMAL HIGH (ref 70–99)
Glucose-Capillary: 150 mg/dL — ABNORMAL HIGH (ref 70–99)
Glucose-Capillary: 139 mg/dL — ABNORMAL HIGH (ref 70–99)
Glucose-Capillary: 124 mg/dL — ABNORMAL HIGH (ref 70–99)

## 2020-12-07 LAB — PROTIME-INR
INR: 1.9 — ABNORMAL HIGH (ref 0.8–1.2)
Prothrombin Time: 21.1 seconds — ABNORMAL HIGH (ref 11.4–15.2)

## 2020-12-07 LAB — MAGNESIUM: Magnesium: 1.8 mg/dL (ref 1.7–2.4)

## 2020-12-07 LAB — AMMONIA: Ammonia: 114 umol/L — ABNORMAL HIGH (ref 9–35)

## 2020-12-07 MED ORDER — ALBUMIN HUMAN 25 % IV SOLN
50.0000 g | Freq: Once | INTRAVENOUS | Status: AC
  Administered 2020-12-07: 12:00:00 50 g via INTRAVENOUS
  Filled 2020-12-07: qty 200

## 2020-12-07 MED ORDER — OCTREOTIDE ACETATE 100 MCG/ML IJ SOLN
100.0000 ug | Freq: Three times a day (TID) | INTRAMUSCULAR | Status: AC
  Administered 2020-12-07 – 2020-12-08 (×5): 100 ug via SUBCUTANEOUS
  Filled 2020-12-07 (×5): qty 1

## 2020-12-07 NOTE — Progress Notes (Signed)
Plan of care reviewed. Pt has not cooperated, oriented to self, disoriented x 3, lethargy and more drowsy today. Poor oral intake, jaundice , icteric sclera, abdominal distention with tenderness, hypoactive bowel sound. She refused lactulose today per RN day shift reported and also refused it tonight. Reported that Pt had incontinent one bowel movement yesterday.     Her vitals remained stable, afebrile, NSR on monitor, BP within normal limits, no respiratory distress, on room air SPO2 96-99%.  We continue to monitor.  Filiberto Pinks, RN

## 2020-12-07 NOTE — Progress Notes (Signed)
Patient ID: Shelby Conner, female   DOB: [birth year], 49 y.o.   MRN: [id number] [facility] Progress Note   Assessment/ Plan:   1. Acute kidney Injury: Secondary to hepatorenal syndrome type I based on timeline of events/available data.  On supportive management with midodrine and octreotide with some improvement of renal function noted overnight-urine output charted still within anuric range.  We will give 50 g of intravenous albumin today and reassess labs tomorrow morning.  Overall prognosis is guarded given marginal candidacy for liver transplantation. 2.  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  Encephalopathy improving on lactulose. 3.  Hyponatremia: Secondary to cirrhosis/AKI and impaired free water handling.  Discussed with patient regarding fluid restriction to <1.5 L a day. 4.  Anion gap metabolic acidosis: Secondary to acute kidney injury and impaired hepatic function, supportive management at this time to limit sodium loading with bicarbonate replacement. 5.  Hypokalemia: Corrected with oral replacement, imperative to supplement further to limit worsening hepatic encephalopathy.  Subjective:   Reports to be doing better with regards to pain and oral intake.  Husband at bedside corroborates this.   Objective:   BP 120/69 (BP Location: Left Arm)   Pulse 72   Temp 97.9 F (36.6 C) (Oral)   Resp 15   Ht 5\' 2"  (1.575 m)   Wt 59 kg   SpO2 98%   BMI 23.78 kg/m   Intake/Output Summary (Last 24 hours) at 12/07/2020 1008 Last data filed at 12/07/2020 0500 Gross per 24 hour  Intake 300 ml  Output 100 ml  Net 200 ml   Weight change:   Physical Exam: Gen: Appears to be more awake/alert compared to yesterday. CVS: Pulse regular rhythm, normal rate, S1 and S2 with ejection systolic murmur Resp: Diminished breath sounds over bases, no distinct rales or rhonchi Abd: Soft, obese, tender over lower quadrants, bowel sounds  normal Ext: Trace ankle edema  Imaging: No results found.  Labs: BMET Recent Labs  Lab 12/03/20 0351 12/03/20 2228 12/04/20 0636 12/05/20 0207 12/05/20 1031 12/06/20 1303 12/07/20 0117  NA 126* 124* 130* 129* 130* 133* 136  K 3.5 2.8* 2.2* 2.9* 3.5 3.3* 3.5  CL 95* 92* 92* 93* 94* 97* 99  CO2 10* 9* 19* 17* 15* 20* 22  GLUCOSE 132* 155* 163* 135* 130* 157* 121*  BUN 120* 113* 112* 104* 103* 98* 99*  CREATININE 6.84* 6.78* 6.47* 5.83* 5.79* 5.18* 4.91*  CALCIUM 8.0* 8.9 9.1 9.2 9.3 9.5 9.8  PHOS 6.6*  --   --   --   --   --   --    CBC Recent Labs  Lab 12/03/20 0351 12/04/20 0636 12/05/20 0207 12/06/20 1303 12/07/20 0117  WBC 9.5 10.2 12.5* 12.7* 12.4*  NEUTROABS 7.4  --   --   --   --   HGB 7.6* 8.7* 8.2* 8.0* 7.9*  HCT 21.9* 23.7* 23.1* 22.8* 22.4*  MCV 106.8* 100.4* 104.1* 104.1* 104.2*  PLT 155  --  187 155 164    Medications:    . feeding supplement  237 mL Oral BID BM  . folic acid  1 mg Oral Daily  . insulin aspart  0-9 Units Subcutaneous TID AC & HS  . lactulose  30 g Oral TID  . midodrine  5 mg Oral TID WC  . multivitamin with minerals  1 tablet Oral Daily  . octreotide  100 mcg Subcutaneous TID  . pantoprazole sodium  40 mg Oral  BID  . rifaximin  550 mg Oral BID  . sodium bicarbonate  1,300 mg Oral TID   Zetta Bills, MD 12/07/2020, 10:08 AM

## 2020-12-07 NOTE — Progress Notes (Signed)
[facility] Progress Note  Shelby Conner 49 y.o. 06-23-1971   Subjective: A little more alert today. Denies BMs overnight. Oriented to person and place but not time. Husband in room.  Objective: Vital signs: Vitals:   12/07/20 0535 12/07/20 0809  BP: 128/76 120/69  Pulse: 69 72  Resp: 18 15  Temp: 98.5 F (36.9 C) 97.9 F (36.6 C)  SpO2: 97% 98%    Physical Exam: Gen: lethargic, no acute distress  HEENT: +icteric sclera CV: RRR Chest: CTA B Abd: diffuse tenderness with guarding, mild distention, +BS Ext: no edema Neuro: +asterixis  Lab Results: Recent Labs    12/06/20 1303 12/07/20 0117  NA 133* 136  K 3.3* 3.5  CL 97* 99  CO2 20* 22  GLUCOSE 157* 121*  BUN 98* 99*  CREATININE 5.18* 4.91*  CALCIUM 9.5 9.8  MG 1.8 1.8   Recent Labs    12/06/20 1303 12/07/20 0117  AST 80* 71*  ALT 50* 44  ALKPHOS 118 121  BILITOT 11.2* 10.2*  PROT 7.3 7.3  ALBUMIN 3.0* 2.9*   Recent Labs    12/06/20 1303 12/07/20 0117  WBC 12.7* 12.4*  HGB 8.0* 7.9*  HCT 22.8* 22.4*  MCV 104.1* 104.2*  PLT 155 164      Assessment/Plan: Decompensated cirrhosis with hepatic encephalopathy and hepatorenal syndrome. Nurse reported that she refused her Lactulose yesterday but husband reports that she took it this morning. Moving loose stools. If refuses Lactulose again then may need to change to Lactulose enemas. Her mental status is minimally better than yesterday. Renal function slowly improving. Husband states it has been a "long time" since she had alcohol but does not know how long but [facility] will determine whether she can proceed with liver transplant evaluation or not after they see her. Unclear how long she has been off of alcohol. Continue supportive care. No further GI recs. Will sign off. Call if questions.   Shelby Conner 12/07/2020, 10:25 AM  Questions please call [phone] ID: Shelby Conner, female   DOB: [birth year], 49 y.o.    MRN: [id number]

## 2020-12-07 NOTE — Progress Notes (Signed)
Physical Therapy Treatment Patient Details Name: Barbera Perritt MRN: [id number] DOB: 1971-09-01 Today's Date: 12/07/2020    History of Present Illness Pt is a 49 year old female who presented to the hospital following recommendation from her PCP due to abnormal labs and that her renal function was markedly abnormal. She was found to have severe acute kidney injury and concern for heaptorenal syndrome. She recently was admitted to this hospital due to abdominal pain and was found to have markedly decompensated liver cirrhosis. Since her recent d/c from the hospital she has been feeling generalized weakness. PMH: fatty liver and DM.    PT Comments    Pt supine in bed on arrival this session.  Pt tolerated session poorly due to behavior during session.  Pt did move from bed to recliner and followed commands with increased time this session.  Continue to recommend HHPT at d/c.   Nurse Shanda Bumps in room during session to observe behavior.      Follow Up Recommendations  Home health PT;Supervision for mobility/OOB     Equipment Recommendations  Other (comment) (shower chair.)    Recommendations for Other Services       Precautions / Restrictions Precautions Precautions: Fall Restrictions Weight Bearing Restrictions: No    Mobility  Bed Mobility Overal bed mobility: Needs Assistance             General bed mobility comments: Pt seated in bed with knees bent,  required assistance to move LEs to edge of bed and advance hips to edge of bed. Mod +1 assistance.  Transfers Overall transfer level: Needs assistance Equipment used: Rolling walker (2 wheeled) Transfers: Sit to/from Stand Sit to Stand: Min guard;Min assist         General transfer comment: Pt able to move into standing with min guard.  Cues for hand placement to and from seated surface and to reach for RW hand grips in standing.  Min assistance to facilitate hip flexion to return to seated  surface.  Ambulation/Gait Ambulation/Gait assistance: Min assist Gait Distance (Feet): 4 Feet (steps from bed to recliner.) Assistive device: Rolling walker (2 wheeled) Gait Pattern/deviations: Decreased stride length;Shuffle;Step-to pattern     General Gait Details: Pt required assistance to move from bed to recliner.  Assistance for RW position and weight shifting to move steps toward recliner.   Stairs             Wheelchair Mobility    Modified Rankin (Stroke Patients Only)       Balance Overall balance assessment: Needs assistance Sitting-balance support: Bilateral upper extremity supported;Feet supported Sitting balance-Leahy Scale: Fair       Standing balance-Leahy Scale: Poor Standing balance comment: UE support on RW and minA to maintain balance due to posterior lean and trunk sway.                            Cognition Arousal/Alertness: Awake/alert Behavior During Therapy: Flat affect;Agitated (would switch back and forth from flat to agitated to crying)   Area of Impairment: Attention;Following commands;Safety/judgement;Awareness;Problem solving                   Current Attention Level: Selective   Following Commands: Follows one step commands inconsistently;Follows one step commands with increased time Safety/Judgement: Decreased awareness of safety;Decreased awareness of deficits Awareness: Emergent Problem Solving: Difficulty sequencing;Requires verbal cues;Slow processing;Decreased initiation;Requires tactile cues General Comments: Pt continues to require multiple cues and repeated commands as she  looses focus quickly.  Pt crying but would not reports if she was crying in pain.  Pt required increased time to interact unless agitated then she would respond quickly.      Exercises      General Comments        Pertinent Vitals/Pain Pain Assessment: Faces Faces Pain Scale: Hurts even more Pain Location: generalized she  would not state a location. Pain Descriptors / Indicators: Crying;Moaning Pain Intervention(s): Monitored during session    Home Living                      Prior Function            PT Goals (current goals can now be found in the care plan section) Acute Rehab PT Goals Patient Stated Goal: none stated Potential to Achieve Goals: Fair Progress towards PT goals: Progressing toward goals    Frequency    Min 3X/week      PT Plan Current plan remains appropriate    Co-evaluation              AM-PAC PT "6 Clicks" Mobility   Outcome Measure  Help needed turning from your back to your side while in a flat bed without using bedrails?: A Little Help needed moving from lying on your back to sitting on the side of a flat bed without using bedrails?: A Little Help needed moving to and from a bed to a chair (including a wheelchair)?: A Little Help needed standing up from a chair using your arms (e.g., wheelchair or bedside chair)?: A Little Help needed to walk in hospital room?: A Little Help needed climbing 3-5 steps with a railing? : A Lot 6 Click Score: 17    End of Session Equipment Utilized During Treatment: Gait belt Activity Tolerance: Patient limited by fatigue;Patient limited by pain Patient left: in chair;with call bell/phone within reach;with chair alarm set Nurse Communication: Mobility status;Patient requests pain meds PT Visit Diagnosis: Unsteadiness on feet (R26.81);Other abnormalities of gait and mobility (R26.89);Muscle weakness (generalized) (M62.81);Difficulty in walking, not elsewhere classified (R26.2);Pain Pain - Right/Left:  (abdomen) Pain - part of body:  (abdomen)     Time: 9702-6378 PT Time Calculation (min) (ACUTE ONLY): 25 min  Charges:  $Therapeutic Activity: 23-37 mins                     Bonney Leitz , PTA Acute Rehabilitation Services Pager [phone] Office [phone]     Tressie Ragin Artis Delay 12/07/2020, 12:05 PM

## 2020-12-07 NOTE — Progress Notes (Signed)
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  Upon arrival to ED: Patient was found to have severe AKI with creatinine of 7.45, BUN: 124, calcium: 8.9, elevated AST of 100, ALT: 69.  Elevated bilirubin of 17.1 with slightly elevated alkaline phosphatase of 135.  WBC: 7.0, hemoglobin: 10.0, platelet: 209, INR: 4.3, ammonia: 143.  Nephrology and GI consulted.  Patient admitted for further evaluation and management of worsening kidney function in the setting of decompensated cirrhosis.  Assessment & Plan:  AKI: -Likely hepatorenal syndrome type 1 in the setting of decompensated liver cirrhosis. -On admission: BUN 124, creatinine: 7.45, GFR: 6  -Ultrasound: Negative for acute findings.  Continue to avoid nephrotoxic medication including NSAIDs.  Hold Lasix and Aldactone. -Continue albumin, midodrine, octreotide -Strict INO's and monitor signs of fluid overload.   -Nephrology recommend 50 g of IV albumin today and reassess labs tomorrow morning-appreciate help.  Decompensated liver cirrhosis: -LFTs: Improving, AST: 100--> 75--> 80, ALT: 69--> 48--> 45, bilirubin: 17.1--> 12.9--> 11.3, alkaline phosphatase: 135--> 105--> 89.  -She has been referred to [facility] transplant center for outpatient transplant evaluation-unclear status at this time. -Continue lactulose and rifaximin-no further GI recommendations.  GI signed off.  Hepatic encephalopathy: Ammonia level 143 on admission trended down to 114 -Continue scheduled lactulose and rifaximin. -Advance diet to  soft.  Coagulopathy: On admission INR elevated at 4.3 trended down to 2.2 to 1.9.  Continue to hold Xarelto.    Anion gap metabolic acidosis: Likely in the setting of worsening kidney function, bicarb 8, anion gap 23 on admission.  Resolved -Received IV sodium bicarb on admission, continue p.o. sodium bicarb-as per nephrology recommendations.  Hypothermia: Resolved.  TSH: WNL. -Urine culture: No growth.  Repeat UC shows multiple species suggest recollection.  History of protein C and protein S deficiency: On chronic Xarelto at home.  Continue to hold Xarelto.  Macrocytic  anemia: H&H: Between 10-8.  EGD last month was negative for varices it shows esophagitis.  Monitor H&H closely and transfuse as needed.  Grade C esophagitis: -Reviewed EGD from 11/23.  Continue Protonix 40 p.o. twice daily  Type 2 diabetes mellitus: Hold Metformin.  Blood glucose elevated likely in the setting of sodium bicarb GTT.  Continue sliding scale insulin.  Monitor blood sugar closely.  History of alcohol abuse: Patient's husband tells me that patient's has quit alcohol use long time ago.  Hyponatremia: Resolved  Severe hypokalemia: Resolved  Prognosis: Overall poor prognosis-family is aware of poor prognosis.  DVT prophylaxis: SCD Code Status: Full code-confirmed with patient's husband Family Communication: Patient's husband present at bedside.  Plan of care discussed with patient in length and she verbalized understanding and agreed with it.  Disposition Plan: To be determined  Consultants:   Nephrology  GI  Procedures:   None  Antimicrobials:   None  Status is: Inpatient  Dispo: The patient is from: Home              Anticipated d/c is to: Home              Anticipated d/c date is: 3 days  Patient currently is not medically stable to d/c.   Subjective: Patient seen and examined.  More alert this morning.  Eating breakfast.  Appears icteric.  Alert and smiling.  No acute  events overnight.  Objective: Vitals:   12/06/20 2333 12/07/20 0535 12/07/20 0809 12/07/20 1200  BP: 113/66 128/76 120/69 114/81  Pulse: 75 69 72 78  Resp: 15 18 15 14   Temp: 98.5 F (36.9 C) 98.5 F (36.9 C) 97.9 F (36.6 C) 98.4 F (36.9 C)  TempSrc: Oral Oral Oral Oral  SpO2: 97% 97% 98% 99%  Weight:      Height:        Intake/Output Summary (Last 24 hours) at 12/07/2020 1459 Last data filed at 12/07/2020 0500 Gross per 24 hour  Intake 300 ml  Output --  Net 300 ml   Filed Weights   12/03/20 0625  Weight: 59 kg    Examination: General exam: Appears calm and comfortable, alert, eating breakfast, on room air, icteric   Respiratory system: Clear to auscultation. Respiratory effort normal. Cardiovascular system: S1 & S2 heard, RRR. No JVD, murmurs, rubs, gallops or clicks. No pedal edema. Gastrointestinal system: Abdomen is mild distended, nontender on palpation, bowel sounds positive. Central nervous system: Alert and following commands. skin: Icteric    Data Reviewed: I have personally reviewed following labs and imaging studies  CBC: Recent Labs  Lab 12/02/20 1444 12/02/20 1832 12/03/20 0351 12/04/20 0636 12/05/20 0207 12/06/20 1303 12/07/20 0117  WBC 17.0*  --  9.5 10.2 12.5* 12.7* 12.4*  NEUTROABS  --   --  7.4  --   --   --   --   HGB 10.0*   < > 7.6* 8.7* 8.2* 8.0* 7.9*  HCT 27.8*   < > 21.9* 23.7* 23.1* 22.8* 22.4*  MCV 108.2*  --  106.8* 100.4* 104.1* 104.1* 104.2*  PLT 209  --  155  --  187 155 164   < > = values in this interval not displayed.   Basic Metabolic Panel: Recent Labs  Lab 12/02/20 2147 12/03/20 0351 12/03/20 2228 12/04/20 0636 12/05/20 0207 12/05/20 1031 12/06/20 1303 12/07/20 0117  NA  --  126*   < > 130* 129* 130* 133* 136  K  --  3.5   < > 2.2* 2.9* 3.5 3.3* 3.5  CL  --  95*   < > 92* 93* 94* 97* 99  CO2  --  10*   < > 19* 17* 15* 20* 22  GLUCOSE  --  132*   < > 163* 135* 130* 157* 121*  BUN  --  120*   < > 112*  104* 103* 98* 99*  CREATININE  --  6.84*   < > 6.47* 5.83* 5.79* 5.18* 4.91*  CALCIUM  --  8.0*   < > 9.1 9.2 9.3 9.5 9.8  MG 1.8 2.3  --  2.2  --   --  1.8 1.8  PHOS  --  6.6*  --   --   --   --   --   --    < > = values in this interval not displayed.   GFR: Estimated Creatinine Clearance: 11.2 mL/min (A) (by C-G formula based on SCr of 4.91 mg/dL (H)). Liver Function Tests: Recent Labs  Lab 12/04/20 0636 12/05/20 0207 12/05/20 1031 12/06/20 1303 12/07/20 0117  AST 75* 80* 92* 80* 71*  ALT 48* 45* 51* 50* 44  ALKPHOS 105 89 104 118 121  BILITOT 12.9*  11.3* 11.7* 11.2* 10.2*  PROT 8.2* 7.2 7.4 7.3 7.3  ALBUMIN 3.6 3.1* 3.2* 3.0* 2.9*   No results for input(s): LIPASE, AMYLASE in the last 168 hours. Recent Labs  Lab 12/03/20 2228 12/04/20 0636 12/05/20 0207 12/06/20 1349 12/07/20 0149  AMMONIA 83* 81* 106* 169* 114*   Coagulation Profile: Recent Labs  Lab 12/03/20 0351 12/04/20 0636 12/05/20 0207 12/06/20 1303 12/07/20 0117  INR 4.4* 2.2* 2.1* 2.0* 1.9*   Cardiac Enzymes: No results for input(s): CKTOTAL, CKMB, CKMBINDEX, TROPONINI in the last 168 hours. BNP (last 3 results) No results for input(s): PROBNP in the last 8760 hours. HbA1C: No results for input(s): HGBA1C in the last 72 hours. CBG: Recent Labs  Lab 12/06/20 1135 12/06/20 1615 12/06/20 2121 12/07/20 0604 12/07/20 1122  GLUCAP 127* 155* 126* 106* 150*   Lipid Profile: No results for input(s): CHOL, HDL, LDLCALC, TRIG, CHOLHDL, LDLDIRECT in the last 72 hours. Thyroid Function Tests: No results for input(s): TSH, T4TOTAL, FREET4, T3FREE, THYROIDAB in the last 72 hours. Anemia Panel: No results for input(s): VITAMINB12, FOLATE, FERRITIN, TIBC, IRON, RETICCTPCT in the last 72 hours. Sepsis Labs: Recent Labs  Lab 12/02/20 2147  LATICACIDVEN 1.9    Recent Results (from the past 240 hour(s))  Resp Panel by RT-PCR (Flu A&B, Covid) Nasopharyngeal Swab     Status: None   Collection Time:  12/02/20 10:00 PM   Specimen: Nasopharyngeal Swab; Nasopharyngeal(NP) swabs in vial transport medium  Result Value Ref Range Status   SARS Coronavirus 2 by RT PCR NEGATIVE NEGATIVE Final    Comment: (NOTE) SARS-CoV-2 target nucleic acids are NOT DETECTED.  The SARS-CoV-2 RNA is generally detectable in upper respiratory specimens during the acute phase of infection. The lowest concentration of SARS-CoV-2 viral copies this assay can detect is 138 copies/mL. A negative result does not preclude SARS-Cov-2 infection and should not be used as the sole basis for treatment or other patient management decisions. A negative result may occur with  improper specimen collection/handling, submission of specimen other than nasopharyngeal swab, presence of viral mutation(s) within the areas targeted by this assay, and inadequate number of viral copies(<138 copies/mL). A negative result must be combined with clinical observations, patient history, and epidemiological information. The expected result is Negative.  Fact Sheet for Patients:  BloggerCourse.com  Fact Sheet for Healthcare Providers:  SeriousBroker.it  This test is no t yet approved or cleared by the [place] FDA and  has been authorized for detection and/or diagnosis of SARS-CoV-2 by FDA under an Emergency Use Authorization (EUA). This EUA will remain  in effect (meaning this test can be used) for the duration of the COVID-19 declaration under Section 564(b)(1) of the Act, 21 U.S.C.section 360bbb-3(b)(1), unless the authorization is terminated  or revoked sooner.       Influenza A by PCR NEGATIVE NEGATIVE Final   Influenza B by PCR NEGATIVE NEGATIVE Final    Comment: (NOTE) The Xpert Xpress SARS-CoV-2/FLU/RSV plus assay is intended as an aid in the diagnosis of influenza from Nasopharyngeal swab specimens and should not be used as a sole basis for treatment. Nasal washings  and aspirates are unacceptable for Xpert Xpress SARS-CoV-2/FLU/RSV testing.  Fact Sheet for Patients: BloggerCourse.com  Fact Sheet for Healthcare Providers: SeriousBroker.it  This test is not yet approved or cleared by the [place] FDA and has been authorized for detection and/or diagnosis of SARS-CoV-2 by FDA under an Emergency Use Authorization (EUA). This EUA will remain in effect (meaning this test can  be used) for the duration of the COVID-19 declaration under Section 564(b)(1) of the Act, 21 U.S.C. section 360bbb-3(b)(1), unless the authorization is terminated or revoked.  Performed at [facility], [address], [place], [place] [address]   Blood culture (routine x 2)     Status: None (Preliminary result)   Collection Time: 12/02/20 10:20 PM   Specimen: BLOOD  Result Value Ref Range Status   Specimen Description BLOOD SITE NOT SPECIFIED  Final   Special Requests   Final    BOTTLES DRAWN AEROBIC AND ANAEROBIC Blood Culture results may not be optimal due to an inadequate volume of blood received in culture bottles   Culture   Final    NO GROWTH 3 DAYS Performed at [facility], [address], [place], [place] [address]    Report Status PENDING  Incomplete  Culture, Urine     Status: None   Collection Time: 12/03/20  6:43 PM   Specimen: Urine, Random  Result Value Ref Range Status   Specimen Description URINE, RANDOM  Final   Special Requests NONE  Final   Culture   Final    NO GROWTH Performed at [facility], [address], [place], [place] [address]    Report Status 12/05/2020 FINAL  Final  Culture, Urine     Status: Abnormal   Collection Time: 12/05/20 10:40 AM   Specimen: Urine, Clean Catch  Result Value Ref Range Status   Specimen Description URINE, CLEAN CATCH  Final   Special Requests   Final    NONE Performed at [facility], [address] [place], [place] [address]     Culture MULTIPLE SPECIES PRESENT, SUGGEST RECOLLECTION (A)  Final   Report Status 12/06/2020 FINAL  Final      Radiology Studies: No results found.  Scheduled Meds: . feeding supplement  237 mL Oral BID BM  . folic acid  1 mg Oral Daily  . insulin aspart  0-9 Units Subcutaneous TID AC & HS  . lactulose  30 g Oral TID  . midodrine  5 mg Oral TID WC  . multivitamin with minerals  1 tablet Oral Daily  . octreotide  100 mcg Subcutaneous TID  . pantoprazole sodium  40 mg Oral BID  . rifaximin  550 mg Oral BID  . sodium bicarbonate  1,300 mg Oral TID   Continuous Infusions:    LOS: 5 days   Time spent: 45 minutes   Jeana Kersting Estill Cotta, MD [facility]  If 7PM-7AM, please contact night-coverage [email or web address] 12/07/2020, 2:59 PM

## 2020-12-08 ENCOUNTER — Ambulatory Visit: Payer: BC Managed Care – PPO | Admitting: Gastroenterology

## 2020-12-08 LAB — CBC
HCT: 19.5 % — ABNORMAL LOW (ref 36.0–46.0)
Hemoglobin: 7 g/dL — ABNORMAL LOW (ref 12.0–15.0)
MCH: 37.6 pg — ABNORMAL HIGH (ref 26.0–34.0)
MCHC: 35.9 g/dL (ref 30.0–36.0)
MCV: 104.8 fL — ABNORMAL HIGH (ref 80.0–100.0)
Platelets: 140 10*3/uL — ABNORMAL LOW (ref 150–400)
RBC: 1.86 MIL/uL — ABNORMAL LOW (ref 3.87–5.11)
RDW: 22.8 % — ABNORMAL HIGH (ref 11.5–15.5)
WBC: 10.7 10*3/uL — ABNORMAL HIGH (ref 4.0–10.5)
nRBC: 0 % (ref 0.0–0.2)

## 2020-12-08 LAB — CULTURE, BLOOD (ROUTINE X 2): Culture: NO GROWTH

## 2020-12-08 LAB — COMPREHENSIVE METABOLIC PANEL
ALT: 39 U/L (ref 0–44)
AST: 64 U/L — ABNORMAL HIGH (ref 15–41)
Albumin: 3.3 g/dL — ABNORMAL LOW (ref 3.5–5.0)
Alkaline Phosphatase: 118 U/L (ref 38–126)
Anion gap: 17 — ABNORMAL HIGH (ref 5–15)
BUN: 88 mg/dL — ABNORMAL HIGH (ref 6–20)
CO2: 23 mmol/L (ref 22–32)
Calcium: 9.9 mg/dL (ref 8.9–10.3)
Chloride: 101 mmol/L (ref 98–111)
Creatinine, Ser: 4.04 mg/dL — ABNORMAL HIGH (ref 0.44–1.00)
GFR, Estimated: 13 mL/min — ABNORMAL LOW (ref >60)
Glucose, Bld: 138 mg/dL — ABNORMAL HIGH (ref 70–99)
Potassium: 3.2 mmol/L — ABNORMAL LOW (ref 3.5–5.1)
Sodium: 141 mmol/L (ref 135–145)
Total Bilirubin: 9.5 mg/dL — ABNORMAL HIGH (ref 0.3–1.2)
Total Protein: 7.2 g/dL (ref 6.5–8.1)

## 2020-12-08 LAB — GLUCOSE, CAPILLARY
Glucose-Capillary: 136 mg/dL — ABNORMAL HIGH (ref 70–99)
Glucose-Capillary: 132 mg/dL — ABNORMAL HIGH (ref 70–99)
Glucose-Capillary: 157 mg/dL — ABNORMAL HIGH (ref 70–99)
Glucose-Capillary: 127 mg/dL — ABNORMAL HIGH (ref 70–99)

## 2020-12-08 LAB — PHOSPHORUS: Phosphorus: 4 mg/dL (ref 2.5–4.6)

## 2020-12-08 LAB — AMMONIA: Ammonia: 136 umol/L — ABNORMAL HIGH (ref 9–35)

## 2020-12-08 LAB — PROTIME-INR
INR: 1.9 — ABNORMAL HIGH (ref 0.8–1.2)
Prothrombin Time: 21 seconds — ABNORMAL HIGH (ref 11.4–15.2)

## 2020-12-08 MED ORDER — ALBUMIN HUMAN 25 % IV SOLN
50.0000 g | Freq: Once | INTRAVENOUS | Status: AC
  Administered 2020-12-08: 11:00:00 50 g via INTRAVENOUS
  Filled 2020-12-08: qty 200

## 2020-12-08 MED ORDER — POTASSIUM CHLORIDE CRYS ER 20 MEQ PO TBCR
20.0000 meq | EXTENDED_RELEASE_TABLET | Freq: Two times a day (BID) | ORAL | Status: AC
  Administered 2020-12-08 – 2020-12-09 (×3): 20 meq via ORAL
  Filled 2020-12-08 (×3): qty 1

## 2020-12-08 NOTE — Progress Notes (Signed)
Occupational Therapy Treatment Patient Details Name: Shelby Conner MRN: [id number] DOB: Apr 24, 1971 Today's Date: 12/08/2020    History of present illness Pt is a 49 year old female who presented to the hospital following recommendation from her PCP due to abnormal labs and that her renal function was markedly abnormal. She was found to have severe acute kidney injury and concern for heaptorenal syndrome. She recently was admitted to this hospital due to abdominal pain and was found to have markedly decompensated liver cirrhosis. Since her recent d/c from the hospital she has been feeling generalized weakness. PMH: fatty liver and DM.   OT comments  Patient continues to exhibit self limiting behaviors.  She did sit up cross legged in bed, washed her face, but declined out of the bed and to the recliner.  Lunch tray was setup for her, she was left staring at the tray.  Unclear what the focus of OT will be, until she agrees to participate in a meaningful way.  OT will try a few more times and most likey discharge to nursing care.    Follow Up Recommendations  Home health OT;Supervision/Assistance - 24 hour    Equipment Recommendations  None recommended by OT    Recommendations for Other Services      Precautions / Restrictions Precautions Precautions: Fall                                                                     ADL either performed or assessed with clinical judgement   ADL   Eating/Feeding: Set up;Sitting   Grooming: Sitting;Set up;Supervision/safety Grooming Details (indicate cue type and reason): sitting cross legged in bed                                     Vision   Additional Comments: patient tracked OT in the room and would make eye contact.       Exercises     Shoulder Instructions       General Comments      Pertinent Vitals/ Pain       Pain Assessment: Faces Faces Pain Scale: Hurts little  more Pain Location: L arm when BP taken Pain Descriptors / Indicators: Crying;Moaning Pain Intervention(s): Monitored during session                                                          Frequency  Min 2X/week        Progress Toward Goals  OT Goals(current goals can now be found in the care plan section)  Progress towards OT goals: Not progressing toward goals - comment (limited participation)  Acute Rehab OT Goals Patient Stated Goal: none stated OT Goal Formulation: Patient unable to participate in goal setting Time For Goal Achievement: 12/20/20 Potential to Achieve Goals: Fair  Plan Discharge plan remains appropriate    Co-evaluation                 AM-PAC OT "6 Clicks" Daily Activity  Outcome Measure   Help from another person eating meals?: A Little Help from another person taking care of personal grooming?: A Little Help from another person toileting, which includes using toliet, bedpan, or urinal?: A Little Help from another person bathing (including washing, rinsing, drying)?: A Lot Help from another person to put on and taking off regular upper body clothing?: A Little Help from another person to put on and taking off regular lower body clothing?: A Lot 6 Click Score: 16    End of Session    OT Visit Diagnosis: Unsteadiness on feet (R26.81);Other abnormalities of gait and mobility (R26.89);Muscle weakness (generalized) (M62.81);Pain;Other symptoms and signs involving cognitive function   Activity Tolerance Other (comment) (Limited by crying behaviors)   Patient Left in bed;with call bell/phone within reach;with bed alarm set   Nurse Communication          Time: [phone] OT Time Calculation (min): 11 min  Charges: OT General Charges $OT Visit: 1 Visit OT Treatments $Self Care/Home Management : 8-22 mins  12/08/2020  Rich, OTR/L  Acute Rehabilitation Services  Office:  [phone]    Suzanna Obey 12/08/2020, 12:39 PM

## 2020-12-08 NOTE — Progress Notes (Signed)
Patient ID: Shelby Conner, female   DOB: March 27, 1971, 49 y.o.   MRN: [id number] [facility] Progress Note   Assessment/ Plan:   1. Acute kidney Injury: Secondary to hepatorenal syndrome type I based on timeline of events/available data.  Improving renal function status post albumin/midodrine and octreotide.  Today I will discontinue octreotide and give her an additional 50 g of intravenous albumin along with replacement of potassium.  Continue to monitor daily labs for ongoing renal recovery.  I have set her up for outpatient follow-up (scheduled to see me at [facility] on 12/29/2020 at 3:30 PM).  Overall prognosis is guarded. 2.  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  Encephalopathy improving on lactulose. 3.  Hyponatremia: Secondary to cirrhosis/AKI and impaired free water handling.  She will need long-term fluid restriction to <1.5 L a day. 4.  Anion gap metabolic acidosis: Secondary to acute kidney injury and impaired hepatic function, supportive management at this time to limit sodium loading with bicarbonate replacement. 5.  Hypokalemia: Hypokalemia noted again, will replace via oral route  Subjective:   Reports to be feeling tired this morning.  Minimal verbal engagement.   Objective:   BP 112/74 (BP Location: Left Arm)    Pulse 71    Temp 98.5 F (36.9 C) (Axillary)    Resp 16    Ht 5\' 2"  (1.575 m)    Wt 59 kg    SpO2 98%    BMI 23.78 kg/m  No intake or output data in the 24 hours ending 12/08/20 0925 Weight change:   Physical Exam: Gen: Appears to be somnolent, arouses with difficulty CVS: Pulse regular rhythm, normal rate, S1 and S2 with ejection systolic murmur Resp: Diminished breath sounds over bases, no distinct rales or rhonchi Abd: Soft, obese, tender over lower quadrants, bowel sounds normal Ext: Trace ankle edema  Imaging: No results found.  Labs: BMET Recent Labs  Lab 12/03/20 0351 12/03/20 2228  12/04/20 0636 12/05/20 0207 12/05/20 1031 12/06/20 1303 12/07/20 0117 12/08/20 0120  NA 126* 124* 130* 129* 130* 133* 136 141  K 3.5 2.8* 2.2* 2.9* 3.5 3.3* 3.5 3.2*  CL 95* 92* 92* 93* 94* 97* 99 101  CO2 10* 9* 19* 17* 15* 20* 22 23  GLUCOSE 132* 155* 163* 135* 130* 157* 121* 138*  BUN 120* 113* 112* 104* 103* 98* 99* 88*  CREATININE 6.84* 6.78* 6.47* 5.83* 5.79* 5.18* 4.91* 4.04*  CALCIUM 8.0* 8.9 9.1 9.2 9.3 9.5 9.8 9.9  PHOS 6.6*  --   --   --   --   --   --  4.0   CBC Recent Labs  Lab 12/03/20 0351 12/04/20 0636 12/05/20 0207 12/06/20 1303 12/07/20 0117 12/08/20 0120  WBC 9.5   < > 12.5* 12.7* 12.4* 10.7*  NEUTROABS 7.4  --   --   --   --   --   HGB 7.6*   < > 8.2* 8.0* 7.9* 7.0*  HCT 21.9*   < > 23.1* 22.8* 22.4* 19.5*  MCV 106.8*   < > 104.1* 104.1* 104.2* 104.8*  PLT 155  --  187 155 164 140*   < > = values in this interval not displayed.    Medications:     feeding supplement  237 mL Oral BID BM   folic acid  1 mg Oral Daily   insulin aspart  0-9 Units Subcutaneous TID AC & HS   lactulose  30 g Oral TID  midodrine  5 mg Oral TID WC   multivitamin with minerals  1 tablet Oral Daily   octreotide  100 mcg Subcutaneous TID   pantoprazole sodium  40 mg Oral BID   rifaximin  550 mg Oral BID   sodium bicarbonate  1,300 mg Oral TID   Zetta Bills, MD 12/08/2020, 9:25 AM

## 2020-12-08 NOTE — Plan of Care (Signed)
  Problem: Education: Goal: Knowledge of General Education information will improve Description Including pain rating scale, medication(s)/side effects and non-pharmacologic comfort measures Outcome: Progressing   

## 2020-12-08 NOTE — Progress Notes (Signed)
PROGRESS NOTE  Shelby Conner  DOB: Jun 08, 1971  PCP: Pcp, No JJO:[id number]  DOA: 12/02/2020  LOS: 6 days   Chief Complaint  Patient presents with  . Abnormal Lab    Brief narrative: Shelby Conner is a 49 y.o. female with PMH significant for T2DM HLD, protein C and S deficiency-on chronic Xarelto at home, advanced decompensated cirrhosis possible secondary to [place] versus ethanol abuse. Patient was sent to the ED on 12/9 by her PCP for abnormal labs-worsening kidney function.  In the ED, patient was found to have severe AKI with creatinine of 7.45, BUN: 124, calcium: 8.9, elevated AST of 100, ALT: 69.  Elevated bilirubin of 17.1 with slightly elevated alkaline phosphatase of 135.  WBC: 7.0, hemoglobin: 10.0, platelet: 209, INR: 4.3, ammonia: 143. Patient was admitted to hospitalist service.  Nephrology and GI consultations were obtained.  Subjective: Patient was seen and examined this morning. Sitting up in bed.  Alert, awake, only able to tell me her name.  Assessment/Plan: AKI: -Likely hepatorenal syndrome in the setting of decompensated liver cirrhosis. -On admission: BUN 124, creatinine: 7.45, GFR: 6  -Renal ultrasound negative.  Lasix and Aldactone remain on hold. -Nephrology is following. -Renal function is improving with administration of albumin, midodrine and octreotide. -Repeat labs tomorrow.  Decompensated liver cirrhosis: -LFTs improving and is stable. -She has been referred to [facility] transplant center for outpatient transplant evaluation-unclear status at this time. -Continue lactulose and rifaximin-no further GI recommendations.  GI signed off. Recent Labs  Lab 12/05/20 0207 12/05/20 1031 12/06/20 1303 12/07/20 0117 12/08/20 0120  AST 80* 92* 80* 71* 64*  ALT 45* 51* 50* 44 39  ALKPHOS 89 104 118 121 118  BILITOT 11.3* 11.7* 11.2* 10.2* 9.5*  PROT 7.2 7.4 7.3 7.3 7.2  ALBUMIN 3.1* 3.2* 3.0* 2.9* 3.3*   Hepatic encephalopathy:  Ammonia  level 143 on admission trended down to 114 but up again today at 136.  Evidently patient is not cooperative with taking lactulose and rifaximin.  Encouraged to improve compliance this afternoon. Recent Labs  Lab 12/04/20 0636 12/05/20 0207 12/06/20 1349 12/07/20 0149 12/08/20 0108  AMMONIA 81* 106* 169* 114* 136*   Coagulopathy:  -Xarelto is on hold because of poor oral intake Recent Labs  Lab 12/04/20 0636 12/05/20 0207 12/06/20 1303 12/07/20 0117 12/08/20 0120  INR 2.2* 2.1* 2.0* 1.9* 1.9*   Anion gap metabolic acidosis: Likely in the setting of worsening kidney function, bicarb 8, anion gap 23 on admission.  Resolved -Received IV sodium bicarb on admission, continue p.o. sodium bicarb-as per nephrology recommendations.  History of protein C and protein S deficiency: On chronic Xarelto at home.    Currently on hold  Macrocytic  anemia: H&H: Between 10-8.  EGD last month was negative for varices it shows esophagitis.  Monitor H&H closely and transfuse as needed.  Grade C esophagitis: -Reviewed EGD from 11/23.  Continue Protonix 40 p.o. twice daily  Type 2 diabetes mellitus: Metformin on hold..  Blood glucose elevated likely in the setting of sodium bicarb GTT.  Continue sliding scale insulin.  Monitor blood sugar closely.  History of alcohol abuse:  Apparently she quit alcohol long time ago.  Hyponatremia: Resolved  Severe hypokalemia: Resolved  Prognosis: Overall poor prognosis-family is aware of poor prognosis   Mobility: Encourage ambulation Code Status:   Code Status: Full Code  Nutritional status: Body mass index is 23.78 kg/m.     Diet Order  DIET SOFT Room service appropriate? Yes with Assist; Fluid consistency: Thin  Diet effective now                 DVT prophylaxis: SCDs  Antimicrobials:  None Fluid: None Consultants: GI Family Communication:  Not at bedside  Status is: Inpatient  Remains inpatient appropriate because:  Remains significantly confused.  Dispo: The patient is from: Home              Anticipated d/c is to: Home once mental status improves              Anticipated d/c date is: 2 to 3 days              Patient currently is not medically stable to d/c.       Infusions:    Scheduled Meds: . feeding supplement  237 mL Oral BID BM  . folic acid  1 mg Oral Daily  . insulin aspart  0-9 Units Subcutaneous TID AC & HS  . lactulose  30 g Oral TID  . midodrine  5 mg Oral TID WC  . multivitamin with minerals  1 tablet Oral Daily  . octreotide  100 mcg Subcutaneous TID  . pantoprazole sodium  40 mg Oral BID  . potassium chloride  20 mEq Oral BID  . rifaximin  550 mg Oral BID  . sodium bicarbonate  1,300 mg Oral TID    Antimicrobials: Anti-infectives (From admission, onward)   Start     Dose/Rate Route Frequency Ordered Stop   12/02/20 2200  rifaximin (XIFAXAN) tablet 550 mg        550 mg Oral 2 times daily 12/02/20 2151        PRN meds: ondansetron **OR** ondansetron (ZOFRAN) IV, oxyCODONE, simethicone   Objective: Vitals:   12/08/20 0800 12/08/20 1123  BP: 112/74 119/65  Pulse: 71 69  Resp: 16 14  Temp: 98.5 F (36.9 C) 97.9 F (36.6 C)  SpO2: 98% 97%   No intake or output data in the 24 hours ending 12/08/20 1430 Filed Weights   12/03/20 0625  Weight: 59 kg   Weight change:  Body mass index is 23.78 kg/m.   Physical Exam: General exam: Middle-aged Caucasian female Skin: No rashes, lesions or ulcers. HEENT: Yellow sclera Lungs: Clear to auscultation bilaterally CVS: Regular rate and rhythm, no murmur GI/Abd soft, nontender, nondistended, bowel sound present CNS: Alert, awake, unable to tell me her name.  Not oriented to place time or person. Psychiatry: Depressed look Extremities: No pedal edema, no calf tenderness  Data Review: I have personally reviewed the laboratory data and studies available.  Recent Labs  Lab 12/03/20 0351 12/04/20 0636  12/05/20 0207 12/06/20 1303 12/07/20 0117 12/08/20 0120  WBC 9.5 10.2 12.5* 12.7* 12.4* 10.7*  NEUTROABS 7.4  --   --   --   --   --   HGB 7.6* 8.7* 8.2* 8.0* 7.9* 7.0*  HCT 21.9* 23.7* 23.1* 22.8* 22.4* 19.5*  MCV 106.8* 100.4* 104.1* 104.1* 104.2* 104.8*  PLT 155  --  187 155 164 140*   Recent Labs  Lab 12/02/20 2147 12/03/20 0351 12/03/20 2228 12/04/20 0636 12/05/20 0207 12/05/20 1031 12/06/20 1303 12/07/20 0117 12/08/20 0120  NA  --  126*   < > 130* 129* 130* 133* 136 141  K  --  3.5   < > 2.2* 2.9* 3.5 3.3* 3.5 3.2*  CL  --  95*   < > 92* 93* 94*  97* 99 101  CO2  --  10*   < > 19* 17* 15* 20* 22 23  GLUCOSE  --  132*   < > 163* 135* 130* 157* 121* 138*  BUN  --  120*   < > 112* 104* 103* 98* 99* 88*  CREATININE  --  6.84*   < > 6.47* 5.83* 5.79* 5.18* 4.91* 4.04*  CALCIUM  --  8.0*   < > 9.1 9.2 9.3 9.5 9.8 9.9  MG 1.8 2.3  --  2.2  --   --  1.8 1.8  --   PHOS  --  6.6*  --   --   --   --   --   --  4.0   < > = values in this interval not displayed.    F/u labs ordered  Signed, Lorin Glass, MD [facility] 12/08/2020

## 2020-12-09 LAB — CBC WITH DIFFERENTIAL/PLATELET
Abs Immature Granulocytes: 0.1 10*3/uL — ABNORMAL HIGH (ref 0.00–0.07)
Basophils Absolute: 0.2 10*3/uL — ABNORMAL HIGH (ref 0.0–0.1)
Basophils Relative: 1 %
Eosinophils Absolute: 0.2 10*3/uL (ref 0.0–0.5)
Eosinophils Relative: 2 %
HCT: 21.2 % — ABNORMAL LOW (ref 36.0–46.0)
Hemoglobin: 7.5 g/dL — ABNORMAL LOW (ref 12.0–15.0)
Immature Granulocytes: 1 %
Lymphocytes Relative: 14 %
Lymphs Abs: 1.7 10*3/uL (ref 0.7–4.0)
MCH: 37.5 pg — ABNORMAL HIGH (ref 26.0–34.0)
MCHC: 35.4 g/dL (ref 30.0–36.0)
MCV: 106 fL — ABNORMAL HIGH (ref 80.0–100.0)
Monocytes Absolute: 0.7 10*3/uL (ref 0.1–1.0)
Monocytes Relative: 6 %
Neutro Abs: 9 10*3/uL — ABNORMAL HIGH (ref 1.7–7.7)
Neutrophils Relative %: 76 %
Platelets: UNDETERMINED 10*3/uL (ref 150–400)
RBC: 2 MIL/uL — ABNORMAL LOW (ref 3.87–5.11)
RDW: 23 % — ABNORMAL HIGH (ref 11.5–15.5)
WBC: 11.8 10*3/uL — ABNORMAL HIGH (ref 4.0–10.5)
nRBC: 0 % (ref 0.0–0.2)

## 2020-12-09 LAB — COMPREHENSIVE METABOLIC PANEL
ALT: 41 U/L (ref 0–44)
AST: 72 U/L — ABNORMAL HIGH (ref 15–41)
Albumin: 4 g/dL (ref 3.5–5.0)
Alkaline Phosphatase: 126 U/L (ref 38–126)
Anion gap: 16 — ABNORMAL HIGH (ref 5–15)
BUN: 70 mg/dL — ABNORMAL HIGH (ref 6–20)
CO2: 25 mmol/L (ref 22–32)
Calcium: 10.2 mg/dL (ref 8.9–10.3)
Chloride: 104 mmol/L (ref 98–111)
Creatinine, Ser: 3.33 mg/dL — ABNORMAL HIGH (ref 0.44–1.00)
GFR, Estimated: 17 mL/min — ABNORMAL LOW (ref >60)
Glucose, Bld: 145 mg/dL — ABNORMAL HIGH (ref 70–99)
Potassium: 3.2 mmol/L — ABNORMAL LOW (ref 3.5–5.1)
Sodium: 145 mmol/L (ref 135–145)
Total Bilirubin: 9.2 mg/dL — ABNORMAL HIGH (ref 0.3–1.2)
Total Protein: 8.1 g/dL (ref 6.5–8.1)

## 2020-12-09 LAB — AMMONIA: Ammonia: 112 umol/L — ABNORMAL HIGH (ref 9–35)

## 2020-12-09 LAB — GLUCOSE, CAPILLARY
Glucose-Capillary: 127 mg/dL — ABNORMAL HIGH (ref 70–99)
Glucose-Capillary: 105 mg/dL — ABNORMAL HIGH (ref 70–99)
Glucose-Capillary: 152 mg/dL — ABNORMAL HIGH (ref 70–99)
Glucose-Capillary: 123 mg/dL — ABNORMAL HIGH (ref 70–99)

## 2020-12-09 LAB — PHOSPHORUS: Phosphorus: 3.5 mg/dL (ref 2.5–4.6)

## 2020-12-09 MED ORDER — POTASSIUM CHLORIDE CRYS ER 20 MEQ PO TBCR
40.0000 meq | EXTENDED_RELEASE_TABLET | Freq: Once | ORAL | Status: AC
  Administered 2020-12-09: 12:00:00 40 meq via ORAL
  Filled 2020-12-09: qty 2

## 2020-12-09 MED ORDER — WARFARIN SODIUM 1 MG PO TABS
1.0000 mg | ORAL_TABLET | Freq: Once | ORAL | Status: AC
  Administered 2020-12-09: 17:00:00 1 mg via ORAL
  Filled 2020-12-09: qty 1

## 2020-12-09 MED ORDER — WARFARIN - PHARMACIST DOSING INPATIENT: Freq: Every day | Status: DC

## 2020-12-09 NOTE — Progress Notes (Signed)
ANTICOAGULATION CONSULT NOTE - Initial Consult  Pharmacy Consult for warfarin Indication: protein C and S deficiency  No Known Allergies  Patient Measurements: Height: 5\' 2"  (157.5 cm) Weight: 59 kg (130 lb) IBW/kg (Calculated) : 50.1   Vital Signs: Temp: 98.5 F (36.9 C) (12/16 0731) Temp Source: Oral (12/16 0731) BP: 137/76 (12/16 0731) Pulse Rate: 83 (12/16 0731)  Labs: Recent Labs    12/06/20 1303 12/07/20 0117 12/08/20 0120 12/09/20 0056  HGB 8.0* 7.9* 7.0* 7.5*  HCT 22.8* 22.4* 19.5* 21.2*  PLT 155 164 140* PLATELET CLUMPS NOTED ON SMEAR, UNABLE TO ESTIMATE  LABPROT 22.3* 21.1* 21.0*  --   INR 2.0* 1.9* 1.9*  --   CREATININE 5.18* 4.91* 4.04* 3.33*    Estimated Creatinine Clearance: 16.5 mL/min (A) (by C-G formula based on SCr of 3.33 mg/dL (H)).   Medical History: Past Medical History:  Diagnosis Date  . Diabetes mellitus without complication (HCC)   . Fatty liver   . Hyperlipidemia   . Protein C deficiency (HCC)   . Protein S deficiency (HCC)     Medications:  Medications Prior to Admission  Medication Sig Dispense Refill Last Dose  . ezetimibe (ZETIA) 10 MG tablet Take 10 mg by mouth daily.   12/02/2020 at Unknown time  . feeding supplement (ENSURE ENLIVE / ENSURE PLUS) LIQD Take 237 mLs by mouth 3 (three) times daily between meals. 237 mL 12 Past Week at Unknown time  . folic acid (FOLVITE) 1 MG tablet Take 1 tablet (1 mg total) by mouth daily. 30 tablet 3 12/02/2020 at Unknown time  . furosemide (LASIX) 20 MG tablet Take 1 tablet (20 mg total) by mouth daily. 30 tablet 0 12/02/2020 at Unknown time  . metFORMIN (GLUCOPHAGE) 500 MG tablet Take 500 mg by mouth 2 (two) times daily.   12/02/2020 at Unknown time  . mirtazapine (REMERON) 15 MG tablet Take 7.5 mg by mouth at bedtime.   12/01/2020 at Unknown time  . Multiple Vitamin (MULTIVITAMIN WITH MINERALS) TABS tablet Take 1 tablet by mouth daily. 30 tablet 0 12/02/2020 at Unknown time  . oxycodone (OXY-IR)  5 MG capsule Take 1 capsule (5 mg total) by mouth every 4 (four) hours as needed for pain. 15 capsule 0 12/02/2020 at Unknown time  . pantoprazole (PROTONIX) 40 MG tablet Take 1 tablet (40 mg total) by mouth 2 (two) times daily. 60 tablet 0 12/02/2020 at Unknown time  . spironolactone (ALDACTONE) 25 MG tablet Take 1 tablet (25 mg total) by mouth daily. 30 tablet 0 12/02/2020 at Unknown time  . VICTOZA 18 MG/3ML SOPN Inject 1.8 mg into the skin daily. Sliding scale   12/02/2020 at Unknown time  . XARELTO 20 MG TABS tablet Take 20 mg by mouth at bedtime.   12/01/2020 at 2130  . lactulose (CHRONULAC) 10 GM/15ML solution Take 45 mLs (30 g total) by mouth 2 (two) times daily. 236 mL 0    Scheduled:  . feeding supplement  237 mL Oral BID BM  . folic acid  1 mg Oral Daily  . insulin aspart  0-9 Units Subcutaneous TID AC & HS  . lactulose  30 g Oral TID  . midodrine  5 mg Oral TID WC  . multivitamin with minerals  1 tablet Oral Daily  . pantoprazole sodium  40 mg Oral BID  . potassium chloride  40 mEq Oral Once  . rifaximin  550 mg Oral BID  . sodium bicarbonate  1,300 mg Oral TID  Assessment: 49 yo female on xarelto PTA (last dose was 12/01/2018) for protein C and S deficiency.  She is noted with AKI and decompensated hepatic cirrhosis (child pugh class= c). Due to liver decompensation plans are to change to warfarin. She may be very sensitive to warfarin so will start conservatively.  -INR= 1.9, hg= 7.5 -t bili= 9.2  Goal of Therapy:  INR 2-3 Monitor platelets by anticoagulation protocol: Yes   Plan:  -Warfarin 1mg  po today -Daily PT/INR  , PharmD Clinical Pharmacist **Pharmacist phone directory can now be found on [email or web address] ([email or web address]).  Listed under [facility].

## 2020-12-09 NOTE — Progress Notes (Signed)
Physical Therapy Treatment Patient Details Name: Shelby Conner MRN: [id number] DOB: [birth year] Today's Date: 12/09/2020    History of Present Illness Pt is a 49 year old female who presented to the hospital following recommendation from her PCP due to abnormal labs and that her renal function was markedly abnormal. She was found to have severe acute kidney injury and concern for heaptorenal syndrome. She recently was admitted to this hospital due to abdominal pain and was found to have markedly decompensated liver cirrhosis. Since her recent d/c from the hospital she has been feeling generalized weakness. PMH: fatty liver and DM.    PT Comments    Pt in bathroom on arrival and needed assistance to get back to bed.  Pt remains limited during session.  Pt did progress to gt in room which is good progress given her behavior.  Continue to recommend HHPT at d/c.     Follow Up Recommendations  Home health PT;Supervision for mobility/OOB     Equipment Recommendations  Other (comment) (shower chair.)    Recommendations for Other Services       Precautions / Restrictions Precautions Precautions: Fall Restrictions Weight Bearing Restrictions: No    Mobility  Bed Mobility Overal bed mobility: Needs Assistance Bed Mobility: Sit to Supine       Sit to supine: Min assist   General bed mobility comments: Min assistance to lower trunk and lift B LEs into bed.  Once in supine placed bed in chair position and encouraged her to eat with her family present.  Transfers Overall transfer level: Needs assistance Equipment used: Rolling walker (2 wheeled) Transfers: Sit to/from Stand Sit to Stand: Supervision         General transfer comment: Able to rise from commode without assistance.  Once in standing hand over hand assistance to place hands on RW.  Ambulation/Gait Ambulation/Gait assistance: Min assist Gait Distance (Feet): 12 Feet Assistive device: Rolling walker (2  wheeled) Gait Pattern/deviations: Decreased stride length;Shuffle;Step-to pattern Gait velocity: decreased   General Gait Details: Pt required assistance to weight shift to progress steps forward ( likely due to behavior ).  Pt also required assistance to turn RW and back to seated surface.   Stairs             Wheelchair Mobility    Modified Rankin (Stroke Patients Only)       Balance Overall balance assessment: Needs assistance Sitting-balance support: Bilateral upper extremity supported;Feet supported Sitting balance-Leahy Scale: Fair Sitting balance - Comments: supervision for safety Postural control: Posterior lean Standing balance support: Bilateral upper extremity supported;During functional activity Standing balance-Leahy Scale: Poor Standing balance comment: External assistance to maintain standing.                            Cognition Arousal/Alertness: Awake/alert Behavior During Therapy: Flat affect;Agitated (mood changes, constantly during session.) Overall Cognitive Status: Impaired/Different from baseline Area of Impairment: Attention;Following commands;Safety/judgement;Awareness;Problem solving                 Orientation Level: Disoriented to;Time;Situation Current Attention Level: Selective Memory: Decreased short-term memory Following Commands: Follows one step commands inconsistently;Follows one step commands with increased time Safety/Judgement: Decreased awareness of safety;Decreased awareness of deficits Awareness: Emergent Problem Solving: Difficulty sequencing;Requires verbal cues;Slow processing;Decreased initiation;Requires tactile cues General Comments: Pt easier to redirect this session.  She continues to require increased time.      Exercises      General Comments  Pertinent Vitals/Pain Pain Assessment: Faces Faces Pain Scale: Hurts little more Pain Location: generalized. Pain Descriptors / Indicators:  Crying;Moaning Pain Intervention(s): Monitored during session;Repositioned    Home Living                      Prior Function            PT Goals (current goals can now be found in the care plan section) Acute Rehab PT Goals Patient Stated Goal: none stated Potential to Achieve Goals: Fair Progress towards PT goals: Progressing toward goals    Frequency    Min 3X/week      PT Plan Current plan remains appropriate    Co-evaluation              AM-PAC PT "6 Clicks" Mobility   Outcome Measure  Help needed turning from your back to your side while in a flat bed without using bedrails?: A Little Help needed moving from lying on your back to sitting on the side of a flat bed without using bedrails?: A Little Help needed moving to and from a bed to a chair (including a wheelchair)?: A Little Help needed standing up from a chair using your arms (e.g., wheelchair or bedside chair)?: A Little Help needed to walk in hospital room?: A Little Help needed climbing 3-5 steps with a railing? : A Little 6 Click Score: 18    End of Session Equipment Utilized During Treatment: Gait belt Activity Tolerance: Patient limited by fatigue;Patient limited by pain Patient left: in chair;with call bell/phone within reach;with chair alarm set Nurse Communication: Mobility status;Patient requests pain meds PT Visit Diagnosis: Unsteadiness on feet (R26.81);Other abnormalities of gait and mobility (R26.89);Muscle weakness (generalized) (M62.81);Difficulty in walking, not elsewhere classified (R26.2);Pain Pain - Right/Left:  (abdomen) Pain - part of body:  (abdomen)     Time: 5009-3818 PT Time Calculation (min) (ACUTE ONLY): 12 min  Charges:  $Gait Training: 8-22 mins                     Bonney Leitz , PTA Acute Rehabilitation Services Pager [phone] Office [phone]     Aerin Delany Artis Delay 12/09/2020, 1:50 PM

## 2020-12-09 NOTE — Progress Notes (Addendum)
PROGRESS NOTE  Shelby Conner  DOB: Mar 07, 1971  PCP: Pcp, No XIP:[id number]  DOA: 12/02/2020  LOS: 7 days   Chief Complaint  Patient presents with  . Abnormal Lab    Brief narrative: Shelby Conner is a 49 y.o. 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 Patient was sent to the ED on 12/9 by her PCP for abnormal labs-worsening kidney function.  In the ED, patient was found to have severe AKI with creatinine of 7.45, BUN: 124, calcium: 8.9, elevated AST of 100, ALT: 69.  Elevated bilirubin of 17.1 with slightly elevated alkaline phosphatase of 135.  WBC: 7.0, hemoglobin: 10.0, platelet: 209, INR: 4.3, ammonia: 143. Patient was admitted to hospitalist service.  Nephrology and GI consultations were obtained. Renal injury was treated with a combination of Ocutricin, midodrine and albumin. Renal function is gradually improving.  Subjective: Patient was seen and examined this morning. Sitting up at the edge of the bed..  Alert, awake, slow to respond but knows she is in the hospital and the name of the president.  Not oriented to time. Patient had a small bowel movement this morning.  Inconsistently compliant to medications in the hospital.  Patient's mother-in-law at bedside.  Assessment/Plan: AKI: -Likely hepatorenal syndrome in the setting of decompensated liver cirrhosis. -On admission: BUN 124, creatinine: 7.45, GFR: 6  -Renal ultrasound negative.  Lasix and Aldactone remain on hold. -Nephrology consult appreciated. -Renal function is improving with administration of albumin, midodrine and octreotide. -Currently sees not on diuretics and remains euvolemic. -Repeat labs tomorrow.  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  Follows up with GI as an outpatient.  See GI note for details on previous work-up.   -LFTs improving and is stable. -She has been referred to [facility] transplant center for outpatient transplant evaluation -Continue lactulose and rifaximin-no further GI recommendations.  GI signed off. -Patient has been inconsistently compliant to lactulose.  Encouraged to improve compliance. Recent Labs  Lab 12/05/20 1031 12/06/20 1303 12/07/20 0117 12/08/20 0120 12/09/20 0056  AST 92* 80* 71* 64* 72*  ALT 51* 50* 44 39 41  ALKPHOS 104 118 121 118 126  BILITOT 11.7* 11.2* 10.2* 9.5* 9.2*  PROT 7.4 7.3 7.3 7.2 8.1  ALBUMIN 3.2* 3.0* 2.9* 3.3* 4.0   Hepatic encephalopathy:  -Ammonia level is better today after seeing admitted to lactulose yesterday.  Encourage compliance.  Also on Xifaxan.  -Mental status is slightly better today.  Repeat ammonia level tomorrow. Recent Labs  Lab 12/05/20 0207 12/06/20 1349 12/07/20 0149 12/08/20 0108 12/09/20 0056  AMMONIA 106* 169* 114* 136* 112*   Macrocytic  anemia Grade C esophagitis -Hemoglobin at baseline between 8-10.  EGD last month was negative for varices it shows esophagitis.  Monitor H&H closely and transfuse as needed. -Continue Protonix twice daily. Recent Labs    11/17/20 0244 12/02/20 1444 12/02/20 1832 12/03/20 0351 12/04/20 0636 12/05/20 0207 12/06/20 1303 12/07/20 0117 12/08/20 0120 12/09/20 0056  HGB 8.7* 10.0* 9.9* 7.6* 8.7* 8.2* 8.0* 7.9* 7.0* 7.5*   Coagulopathy History of protein C&S deficiency -According to patient's husband,  she has been on anticoagulation for several years.  Mostly on Coumadin until a year ago when she was switched to Xarelto.  Since the diagnosis of liver cirrhosis and last few months, patient  has continued to remain on Xarelto. D/w pharmacist.  Technically, a DOAC is not indicated in case of liver cirrhosis.  Coumadin would be the only other option.  Discussed with patient's husband.  He agrees to get her switched back to Coumadin.  We will initiate that today.    Anion gap metabolic acidosis: Likely in the setting of worsening kidney function, bicarb 8, anion gap 23 on admission.  Resolved -Received IV sodium bicarb on admission, continue p.o. sodium bicarb-as per nephrology recommendations.  Type 2 diabetes mellitus:  -A1c 4 on 11/20.  Metformin on hold. -Currently fingerstick blood sugars running consistently less than 150.  Hyponatremia: Resolved Recent Labs  Lab 12/02/20 1832 12/03/20 0351 12/03/20 2228 12/04/20 0636 12/05/20 0207 12/05/20 1031 12/06/20 1303 12/07/20 0117 12/08/20 0120 12/09/20 0056  NA 127* 126* 124* 130* 129* 130* 133* 136 141 145   Hypokalemia:  -Potassium 3.2 today.  Oral replacement given.  Repeat tomorrow.  Prognosis: Overall poor prognosis-family is aware of poor prognosis  Mobility: Encourage ambulation Code Status:   Code Status: Full Code  Nutritional status: Body mass index is 23.78 kg/m.     Diet Order            DIET SOFT Room service appropriate? Yes with Assist; Fluid consistency: Thin  Diet effective now                 DVT prophylaxis: SCDs  Antimicrobials:  None Fluid: None Consultants: GI Family Communication:  Mother-in-law at bedside.  Discussed with husband on the phone.  Status is: Inpatient  Remains inpatient appropriate because: Remains significantly confused.  Dispo: The patient is from: Home              Anticipated d/c is to: Home once mental status improves              Anticipated d/c date is: 2 to 3 days              Patient currently is not medically stable to d/c.  Infusions:    Scheduled Meds: . feeding supplement  237 mL Oral BID BM  . folic acid  1 mg Oral Daily  . insulin aspart   0-9 Units Subcutaneous TID AC & HS  . lactulose  30 g Oral TID  . midodrine  5 mg Oral TID WC  . multivitamin with minerals  1 tablet Oral Daily  . pantoprazole sodium  40 mg Oral BID  . rifaximin  550 mg Oral BID  . sodium bicarbonate  1,300 mg Oral TID    Antimicrobials: Anti-infectives (From admission, onward)   Start     Dose/Rate Route Frequency Ordered Stop   12/02/20 2200  rifaximin (XIFAXAN) tablet 550 mg        550 mg Oral 2 times daily 12/02/20 2151        PRN meds: ondansetron **OR** ondansetron (ZOFRAN) IV, oxyCODONE, simethicone   Objective: Vitals:   12/09/20 0321 12/09/20 0731  BP: (!) 141/87 137/76  Pulse: 78 83  Resp: 16 16  Temp: 98.3 F (36.8 C) 98.5 F (36.9 C)  SpO2: 100% 98%    Intake/Output Summary (Last 24 hours) at 12/09/2020 1046 Last data filed at 12/09/2020 0945 Gross per 24 hour  Intake 490 ml  Output --  Net 490 ml   Filed Weights   12/03/20 0625  Weight: 59 kg   Weight change:  Body mass index is 23.78 kg/m.   Physical Exam: General exam: Middle-aged Caucasian female.  Not in physical distress Skin: No rashes, lesions or ulcers. HEENT: Yellow sclera Lungs: Clear to auscultation bilaterally CVS: Regular rate and rhythm, no murmur GI/Abd soft, nontender, nondistended, bowel sound present CNS: Alert, awake, not she is in the hospital.  Not oriented to time.  Mental status slightly better than yesterday.Marland Kitchen Psychiatry: Depressed look Extremities: No pedal edema, no calf tenderness  Data Review: I have personally reviewed the laboratory data and studies available.  Recent Labs  Lab 12/03/20 0351 12/04/20 0636 12/05/20 0207 12/06/20 1303 12/07/20 0117 12/08/20 0120 12/09/20 0056  WBC 9.5   < > 12.5* 12.7* 12.4* 10.7* 11.8*  NEUTROABS 7.4  --   --   --   --   --  9.0*  HGB 7.6*   < > 8.2* 8.0* 7.9* 7.0* 7.5*  HCT 21.9*   < > 23.1* 22.8* 22.4* 19.5* 21.2*  MCV 106.8*   < > 104.1* 104.1* 104.2* 104.8* 106.0*  PLT 155  --   187 155 164 140* PLATELET CLUMPS NOTED ON SMEAR, UNABLE TO ESTIMATE   < > = values in this interval not displayed.   Recent Labs  Lab 12/02/20 2147 12/03/20 0351 12/03/20 2228 12/04/20 0636 12/05/20 0207 12/05/20 1031 12/06/20 1303 12/07/20 0117 12/08/20 0120 12/09/20 0056  NA  --  126*   < > 130*   < > 130* 133* 136 141 145  K  --  3.5   < > 2.2*   < > 3.5 3.3* 3.5 3.2* 3.2*  CL  --  95*   < > 92*   < > 94* 97* 99 101 104  CO2  --  10*   < > 19*   < > 15* 20* 22 23 25   GLUCOSE  --  132*   < > 163*   < > 130* 157* 121* 138* 145*  BUN  --  120*   < > 112*   < > 103* 98* 99* 88* 70*  CREATININE  --  6.84*   < > 6.47*   < > 5.79* 5.18* 4.91* 4.04* 3.33*  CALCIUM  --  8.0*   < > 9.1   < > 9.3 9.5 9.8 9.9 10.2  MG 1.8 2.3  --  2.2  --   --  1.8 1.8  --   --   PHOS  --  6.6*  --   --   --   --   --   --  4.0 3.5   < > = values in this interval not displayed.    F/u labs ordered  Signed, , MD [facility] 12/09/2020

## 2020-12-09 NOTE — Discharge Instructions (Addendum)
Acute Kidney Injury, Adult  Acute kidney injury is a sudden worsening of kidney function. The kidneys are organs that have several jobs. They filter the blood to remove waste products and extra fluid. They also maintain a healthy balance of minerals and hormones in the body, which helps control blood pressure and keep bones strong. With this condition, your kidneys do not do their jobs as well as they should. This condition ranges from mild to severe. Over time it may develop into long-lasting (chronic) kidney disease. Early detection and treatment may prevent acute kidney injury from developing into a chronic condition. What are the causes? Common causes of this condition include:  A problem with blood flow to the kidneys. This may be caused by: ? Low blood pressure (hypotension) or shock. ? Blood loss. ? Heart and blood vessel (cardiovascular) disease. ? Severe burns. ? Liver disease.  Direct damage to the kidneys. This may be caused by: ? Certain medicines. ? A kidney infection. ? Poisoning. ? Being around or in contact with toxic substances. ? A surgical wound. ? A hard, direct hit to the kidney area.  A sudden blockage of urine flow. This may be caused by: ? Cancer. ? Kidney stones. ? An enlarged prostate in males. What are the signs or symptoms? Symptoms of this condition may not be obvious until the condition becomes severe. Symptoms of this condition can include:  Tiredness (lethargy), or difficulty staying awake.  Nausea or vomiting.  Swelling (edema) of the face, legs, ankles, or feet.  Problems with urination, such as: ? Abdominal pain, or pain along the side of your stomach (flank). ? Decreased urine production. ? Decrease in the force of urine flow.  Muscle twitches and cramps, especially in the legs.  Confusion or trouble concentrating.  Loss of appetite.  Fever. How is this diagnosed? This condition may be diagnosed with tests, including:  Blood  tests.  Urine tests.  Imaging tests.  A test in which a sample of tissue is removed from the kidneys to be examined under a microscope (kidney biopsy). How is this treated? Treatment for this condition depends on the cause and how severe the condition is. In mild cases, treatment may not be needed. The kidneys may heal on their own. In more severe cases, treatment will involve:  Treating the cause of the kidney injury. This may involve changing any medicines you are taking or adjusting your dosage.  Fluids. You may need specialized IV fluids to balance your body's needs.  Having a catheter placed to drain urine and prevent blockages.  Preventing problems from occurring. This may mean avoiding certain medicines or procedures that can cause further injury to the kidneys. In some cases treatment may also require:  A procedure to remove toxic wastes from the body (dialysis or continuous renal replacement therapy - CRRT).  Surgery. This may be done to repair a torn kidney, or to remove the blockage from the urinary system. Follow these instructions at home: Medicines  Take over-the-counter and prescription medicines only as told by your health care provider.  Do not take any new medicines without your health care provider's approval. Many medicines can worsen your kidney damage.  Do not take any vitamin and mineral supplements without your health care provider's approval. Many nutritional supplements can worsen your kidney damage. Lifestyle  If your health care provider prescribed changes to your diet, follow them. You may need to decrease the amount of protein you eat.  Achieve and maintain a  healthy weight. If you need help with this, ask your health care provider.  Start or continue an exercise plan. Try to exercise at least 30 minutes a day, 5 days a week.  Do not use any tobacco products, such as cigarettes, chewing tobacco, and e-cigarettes. If you need help quitting, ask your  health care provider. General instructions  Keep track of your blood pressure. Report changes in your blood pressure as told by your health care provider.  Stay up to date with immunizations. Ask your health care provider which immunizations you need.  Keep all follow-up visits as told by your health care provider. This is important. Where to find more information  American Association of Kidney Patients: ResidentialShow.iswww.aakp.org  [employer]: www.kidney.org  American Kid[employer] Fund: FightingMatch.com.eewww.akfinc.org  [facility]: ? www.lifeoptions.org ? www.kidneyschool.org Contact a health care provider if:  Your symptoms get worse.  You develop new symptoms. Get help right away if:  You develop symptoms of worsening kidney disease, which include: ? Headaches. ? Abnormally dark or light skin. ? Easy bruising. ? Frequent hiccups. ? Chest pain. ? Shortness of breath. ? End of menstruation in women. ? Seizures. ? Confusion or altered mental status. ? Abdominal or back pain. ? Itchiness.  You have a fever.  Your body is producing less urine.  You have pain or bleeding when you urinate. Summary  Acute kidney injury is a sudden worsening of kidney function.  Acute kidney injury can be caused by problems with blood flow to the kidneys, direct damage to the kidneys, and sudden blockage of urine flow.  Symptoms of this condition may not be obvious until it becomes severe. Symptoms may include edema, lethargy, confusion, nausea or vomiting, and problems passing urine.  This condition can usually be diagnosed with blood tests, urine tests, and imaging tests. Sometimes a kidney biopsy is done to diagnose this condition.  Treatment for this condition often involves treating the underlying cause. It is treated with fluids, medicines, dialysis, diet changes, or surgery. This information is not intended to replace advice given to you by your health care provider. Make  sure you discuss any questions you have with your health care provider. Document Revised: 11/23/2017 Document Reviewed: 12/01/2016 Elsevier Patient Education  2020 [employer] Inc.  Hepatic Encephalopathy  Hepatic encephalopathy is a loss of brain function due to advanced liver disease. When the liver is damaged, harmful substances (toxins) can build up in the body. Some of these toxins, such as ammonia, can harm the brain. The effects of the condition depend on the type of liver damage and how severe it is. In some cases, hepatic encephalopathy can be reversed. What are the causes? Certain things can trigger or worsen hepatic encephalopathy, such as:  Infection.  Constipation.  Taking certain medicines, such as benzodiazepines.  Alcohol use.  Bleeding into the intestinal tract.  Imbalances in minerals (electrolytes) in the body.  Dehydration. Hepatic encephalopathy can sometimes be reversed if these triggers are resolved. What increases the risk? You are at risk of developing this condition if you have advanced liver disease (cirrhosis). Conditions that can cause liver disease include:  Infections in the liver, such as hepatitis C.  Infections in the blood.  Drinking a lot of alcohol over a long period of time.  Taking certain medicines, including tranquilizers, diuretics, antidepressants, sleeping pills, or acetaminophen.  Genetic diseases, such as Wilson's disease. What are the signs or symptoms? Symptoms may develop suddenly. Or, they may develop slowly and get  worse gradually. Symptoms can range from mild to severe. Mild symptoms include:  Mild confusion.  Shortened attention span.  Personality and mood changes.  Anxiety and agitation.  Drowsiness. Symptoms of worsening or severe hepatic encephalopathy include:  Extreme confusion (disorientation).  Slowed movement.  Slurred speech.  Extreme personality changes.  Abnormal shaking or flapping of the  hands.  Coma. How is this diagnosed? This condition may be diagnosed based on:  A physical exam.  Your symptoms and medical history.  Blood tests. These may be done to check levels of ammonia in your blood, measure how long it takes your blood to clot, or check for infection.  Liver function tests. These may be done to check how well your liver is working.  MRI and CT scans. These may be done to check for a brain disorder and to check for problems with your liver.  Electroencephalogram (EEG). This test measures the electrical activity in your brain. How is this treated? The first step in treatment is to identify and treat the cause of your liver damage or triggering illness, if possible. The next step is taking medicine to lower the level of toxins in your body and prevent ammonia from building up. Treatment will depend on how severe your encephalopathy is, and may include:  Medicine to lower your ammonia level (lactulose).  Antibiotic medicine to reduce the amount of ammonia-producing bacteria in your gut.  Close monitoring of your blood pressure, heart rate, breathing, and oxygen levels.  Removal of fluid from your abdomen.  Close monitoring of how you think, feel, and act (mental status).  Dietary changes.  Liver transplant, in severe cases. Follow these instructions at home: Eating and drinking   Work with a dietitian or with your health care provider to make sure you are getting the right balance of protein and minerals.  Drink enough fluids to keep your urine pale yellow.  Do not drink alcohol or use drugs. General instructions  If you were prescribed an antibiotic, take it as told by your health care provider. Do not stop taking the antibiotic even if your condition improves.  Take other over-the-counter and prescription medicines only as told by your health care provider.  Do not start taking any new medicines, including over-the-counter medicines, without  first checking with your health care provider.  Keep all follow-up visits as told by your health care provider. This is important. Contact a health care provider if:  You develop new symptoms.  Your symptoms change or get worse.  You have a fever.  You are constipated. Signs of constipation include having: ? Fewer bowel movements in a week than normal. ? Trouble having a bowel movement. ? Stools that are dry, hard, or larger than normal.  You have persistent nausea, vomiting, or diarrhea. Get help right away if:  You become very confused or drowsy.  You vomit blood or material that looks like coffee grounds.  Your stool is bloody, black, or looks like tar. Summary  Hepatic encephalopathy is a loss of brain function due to advanced liver disease. When the liver is damaged, harmful substances (toxins) can build up in your body. Some of these toxins, such as ammonia, can harm your brain.  Certain things can trigger or worsen hepatic encephalopathy. Hepatic encephalopathy can sometimes be reversed if these triggers are resolved.  The first step in treatment is to identify and treat the cause of your liver damage or triggering illness, if possible. The next step is taking medicine to  lower the level of toxins in your body and prevent ammonia from building up.  Your treatment will depend on how severe your hepatic encephalopathy is. This information is not intended to replace advice given to you by your health care provider. Make sure you discuss any questions you have with your health care provider. Document Revised: 11/23/2017 Document Reviewed: 09/11/2017 Elsevier Patient Education  2020 [employer]. Information on my medicine - Coumadin   (Warfarin)   Why was Coumadin prescribed for you? Coumadin was prescribed for you because you have a blood clot or a medical condition that can cause an increased risk of forming blood clots. Blood clots can cause serious health problems by  blocking the flow of blood to the heart, lung, or brain. Coumadin can prevent harmful blood clots from forming. 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 While on Coumadin (warfarin) you will need to have an INR test regularly to ensure that your dose is keeping you in the desired range. The INR (international normalized ratio) number is calculated from the result of the laboratory test called prothrombin time (PT).  If an INR APPOINTMENT HAS NOT ALREADY BEEN MADE FOR YOU please schedule an appointment to have this lab work done by your health care provider within 7 days. Your INR goal is usually a number between:  2 to 3 or your provider may give you a more narrow range like 2-2.5.  Ask your health care provider during an office visit what your goal INR is.  What  do you need to  know  About  COUMADIN? Take Coumadin (warfarin) exactly as prescribed by your healthcare provider about the same time each day.  DO NOT stop taking without talking to the doctor who prescribed the medication.  Stopping without other blood clot prevention medication to take the place of Coumadin may increase your risk of developing a new clot or stroke.  Get refills before you run out.  What do you do if you miss a dose? If you miss a dose, take it as soon as you remember on the same day then continue your regularly scheduled regimen the next day.  Do not take two doses of Coumadin at the same time.  Important Safety Information A possible side effect of Coumadin (Warfarin) is an increased risk of bleeding. You should call your healthcare provider right away if you experience any of the following: ? Bleeding from an injury or your nose that does not stop. ? Unusual colored urine (red or dark brown) or unusual colored stools (red or black). ? Unusual bruising for unknown reasons. ? A serious fall or if you hit your head  (even if there is no bleeding).  Some foods or medicines interact with Coumadin (warfarin) and might alter your response to warfarin. To help avoid this: ? Eat a balanced diet, maintaining a consistent amount of Vitamin K. ? Notify your provider about major diet changes you plan to make. ? Avoid alcohol or limit your intake to 1 drink for women and 2 drinks for men per day. (1 drink is 5 oz. wine, 12 oz. beer, or 1.5 oz. liquor.)  Make sure that ANY health care provider who prescribes medication for you knows that you are taking Coumadin (warfarin).  Also make sure the healthcare provider who is monitoring your Coumadin knows when you have started a new medication  including herbals and non-prescription products.  Coumadin (Warfarin)  Major Drug Interactions  Increased Warfarin Effect Decreased Warfarin Effect  Alcohol (large quantities) Antibiotics (esp. Septra/Bactrim, Flagyl, Cipro) Amiodarone (Cordarone) Aspirin (ASA) Cimetidine (Tagamet) Megestrol (Megace) NSAIDs (ibuprofen, naproxen, etc.) Piroxicam (Feldene) Propafenone (Rythmol SR) Propranolol (Inderal) Isoniazid (INH) Posaconazole (Noxafil) Barbiturates (Phenobarbital) Carbamazepine (Tegretol) Chlordiazepoxide (Librium) Cholestyramine (Questran) Griseofulvin Oral Contraceptives Rifampin Sucralfate (Carafate) Vitamin K   Coumadin (Warfarin) Major Herbal Interactions  Increased Warfarin Effect Decreased Warfarin Effect  Garlic Ginseng Ginkgo biloba Coenzyme Q10 Green tea St. John's wort    Coumadin (Warfarin) FOOD Interactions  Eat a consistent number of servings per week of foods HIGH in Vitamin K (1 serving =  cup)  Collards (cooked, or boiled & drained) Kale (cooked, or boiled & drained) Mustard greens (cooked, or boiled & drained) Parsley *serving size only =  cup Spinach (cooked, or boiled & drained) Swiss chard (cooked, or boiled & drained) Turnip greens (cooked, or boiled & drained)  Eat a  consistent number of servings per week of foods MEDIUM-HIGH in Vitamin K (1 serving = 1 cup)  Asparagus (cooked, or boiled & drained) Broccoli (cooked, boiled & drained, or raw & chopped) Brussel sprouts (cooked, or boiled & drained) *serving size only =  cup Lettuce, raw (green leaf, endive, romaine) Spinach, raw Turnip greens, raw & chopped   These websites have more information on Coumadin (warfarin):  http://www.king-russell.com/; https://www.hines.net/;

## 2020-12-09 NOTE — Plan of Care (Signed)
Plan of care reviewed.   Problem: Clinical Measurements: she's hemodynamically stable, remained afebrile.  Goal: Will remain free from infection Outcome: Progressing   Problem: Nutrition: poor oral intake. Required fully assist with meal, but frequently refused to eat. Goal: Adequate nutrition will be maintained Outcome: Not progressing   Problem: Elimination: Incontinent bowel and urine with multiple watery bowel movement. Required maximum assist. Goal: Will not experience complications related to bowel motility Outcome: Progressing   Problem: Clinical Measurements: BUN/Cr and Ammonia has been high. Lactulose given. Pt's not compliant with prescriptions. She occasionally refused taking medication. Goal: Diagnostic test results will improve Outcome: Not progressing   Problem: Skin Integrity: general appearance of Jaundice, dry skin, 2+ pitting edema generally, Hx of low Albumin, low proteinemia, poor oral intake.   Goal: Risk for impaired skin integrity will decrease Outcome: Progressing: no skin breakdown or open wound. Continue to protect skin integrity.   Filiberto Pinks, RN

## 2020-12-09 NOTE — Progress Notes (Signed)
Patient ID: Shelby Conner, female   DOB: June 25, 1971, 49 y.o.   MRN: [id number] [name] Progress Note   Assessment/ Plan:   1. Acute kidney Injury: Secondary to hepatorenal syndrome type I based on timeline of events/available data.  Octreotide discontinued yesterday and renal function continues to improve on a combination of midodrine and recently administered albumin.  With the trajectory of renal function, appears stable enough to discharge home with ongoing outpatient follow-up.  She is set up to see me as an outpatient in 3 weeks (details updated on chart). 2.  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  Encephalopathy improving on lactulose. 3.  Hyponatremia: Secondary to cirrhosis/AKI and impaired free water handling.  Reminded to continue with long-term fluid restriction of <1.5 L a day. 4.  Anion gap metabolic acidosis: Secondary to acute kidney injury and impaired hepatic function, supportive management at this time to limit sodium loading with bicarbonate replacement. 5.  Hypokalemia: Hypokalemia noted again, replace via oral route and recheck later in the day.  Renal service will sign off at this time and remain available for questions or concerns.  She can be discharged after potassium replacement.  Subjective:   Denies any acute events overnight and states that she is breathing well.  Spoke with her husband Mardelle Matte over the phone and updated him on progress.   Objective:   BP 137/76 (BP Location: Left Arm)   Pulse 83   Temp 98.5 F (36.9 C) (Oral)   Resp 16   Ht 5\' 2"  (1.575 m)   Wt 59 kg   SpO2 98%   BMI 23.78 kg/m   Intake/Output Summary (Last 24 hours) at 12/09/2020 0912 Last data filed at 12/08/2020 2204 Gross per 24 hour  Intake 250 ml  Output --  Net 250 ml   Weight change:   Physical Exam: Gen: Sitting up on the edge of her bed, taking medications/getting ready to eat breakfast.  Mother-in-law at  bedside. CVS: Pulse regular rhythm, normal rate, S1 and S2 with ejection systolic murmur Resp: Diminished breath sounds over bases, no distinct rales or rhonchi Abd: Soft, obese, tender over lower quadrants, bowel sounds normal Ext: Trace ankle edema  Imaging: No results found.  Labs: BMET Recent Labs  Lab 12/03/20 0351 12/03/20 2228 12/04/20 0636 12/05/20 0207 12/05/20 1031 12/06/20 1303 12/07/20 0117 12/08/20 0120 12/09/20 0056  NA 126*   < > 130* 129* 130* 133* 136 141 145  K 3.5   < > 2.2* 2.9* 3.5 3.3* 3.5 3.2* 3.2*  CL 95*   < > 92* 93* 94* 97* 99 101 104  CO2 10*   < > 19* 17* 15* 20* 22 23 25   GLUCOSE 132*   < > 163* 135* 130* 157* 121* 138* 145*  BUN 120*   < > 112* 104* 103* 98* 99* 88* 70*  CREATININE 6.84*   < > 6.47* 5.83* 5.79* 5.18* 4.91* 4.04* 3.33*  CALCIUM 8.0*   < > 9.1 9.2 9.3 9.5 9.8 9.9 10.2  PHOS 6.6*  --   --   --   --   --   --  4.0 3.5   < > = values in this interval not displayed.   CBC Recent Labs  Lab 12/03/20 0351 12/04/20 0636 12/06/20 1303 12/07/20 0117 12/08/20 0120 12/09/20 0056  WBC 9.5   < > 12.7* 12.4* 10.7* 11.8*  NEUTROABS 7.4  --   --   --   --  9.0*  HGB 7.6*   < > 8.0* 7.9* 7.0* 7.5*  HCT 21.9*   < > 22.8* 22.4* 19.5* 21.2*  MCV 106.8*   < > 104.1* 104.2* 104.8* 106.0*  PLT 155   < > 155 164 140* PLATELET CLUMPS NOTED ON SMEAR, UNABLE TO ESTIMATE   < > = values in this interval not displayed.    Medications:    . feeding supplement  237 mL Oral BID BM  . folic acid  1 mg Oral Daily  . insulin aspart  0-9 Units Subcutaneous TID AC & HS  . lactulose  30 g Oral TID  . midodrine  5 mg Oral TID WC  . multivitamin with minerals  1 tablet Oral Daily  . pantoprazole sodium  40 mg Oral BID  . rifaximin  550 mg Oral BID  . sodium bicarbonate  1,300 mg Oral TID   Zetta Bills, MD 12/09/2020, 9:12 AM

## 2020-12-10 LAB — CBC WITH DIFFERENTIAL/PLATELET
Abs Immature Granulocytes: 0.07 10*3/uL (ref 0.00–0.07)
Basophils Absolute: 0.2 10*3/uL — ABNORMAL HIGH (ref 0.0–0.1)
Basophils Relative: 1 %
Eosinophils Absolute: 0.2 10*3/uL (ref 0.0–0.5)
Eosinophils Relative: 2 %
HCT: 24.5 % — ABNORMAL LOW (ref 36.0–46.0)
Hemoglobin: 7.9 g/dL — ABNORMAL LOW (ref 12.0–15.0)
Immature Granulocytes: 1 %
Lymphocytes Relative: 14 %
Lymphs Abs: 1.8 10*3/uL (ref 0.7–4.0)
MCH: 35.9 pg — ABNORMAL HIGH (ref 26.0–34.0)
MCHC: 32.2 g/dL (ref 30.0–36.0)
MCV: 111.4 fL — ABNORMAL HIGH (ref 80.0–100.0)
Monocytes Absolute: 0.6 10*3/uL (ref 0.1–1.0)
Monocytes Relative: 5 %
Neutro Abs: 9.9 10*3/uL — ABNORMAL HIGH (ref 1.7–7.7)
Neutrophils Relative %: 77 %
Platelets: UNDETERMINED 10*3/uL (ref 150–400)
RBC: 2.2 MIL/uL — ABNORMAL LOW (ref 3.87–5.11)
RDW: 22.5 % — ABNORMAL HIGH (ref 11.5–15.5)
WBC: 12.7 10*3/uL — ABNORMAL HIGH (ref 4.0–10.5)
nRBC: 0 % (ref 0.0–0.2)

## 2020-12-10 LAB — COMPREHENSIVE METABOLIC PANEL
ALT: 43 U/L (ref 0–44)
AST: 80 U/L — ABNORMAL HIGH (ref 15–41)
Albumin: 3.9 g/dL (ref 3.5–5.0)
Alkaline Phosphatase: 131 U/L — ABNORMAL HIGH (ref 38–126)
Anion gap: 12 (ref 5–15)
BUN: 52 mg/dL — ABNORMAL HIGH (ref 6–20)
CO2: 25 mmol/L (ref 22–32)
Calcium: 10.1 mg/dL (ref 8.9–10.3)
Chloride: 105 mmol/L (ref 98–111)
Creatinine, Ser: 2.49 mg/dL — ABNORMAL HIGH (ref 0.44–1.00)
GFR, Estimated: 23 mL/min — ABNORMAL LOW (ref >60)
Glucose, Bld: 173 mg/dL — ABNORMAL HIGH (ref 70–99)
Potassium: 3.5 mmol/L (ref 3.5–5.1)
Sodium: 142 mmol/L (ref 135–145)
Total Bilirubin: 8.6 mg/dL — ABNORMAL HIGH (ref 0.3–1.2)
Total Protein: 8.4 g/dL — ABNORMAL HIGH (ref 6.5–8.1)

## 2020-12-10 LAB — PROTIME-INR
INR: 1.8 — ABNORMAL HIGH (ref 0.8–1.2)
Prothrombin Time: 19.9 seconds — ABNORMAL HIGH (ref 11.4–15.2)

## 2020-12-10 LAB — PHOSPHORUS: Phosphorus: 3.3 mg/dL (ref 2.5–4.6)

## 2020-12-10 LAB — GLUCOSE, CAPILLARY
Glucose-Capillary: 133 mg/dL — ABNORMAL HIGH (ref 70–99)
Glucose-Capillary: 116 mg/dL — ABNORMAL HIGH (ref 70–99)
Glucose-Capillary: 148 mg/dL — ABNORMAL HIGH (ref 70–99)
Glucose-Capillary: 117 mg/dL — ABNORMAL HIGH (ref 70–99)

## 2020-12-10 LAB — AMMONIA: Ammonia: 113 umol/L — ABNORMAL HIGH (ref 9–35)

## 2020-12-10 LAB — MAGNESIUM: Magnesium: 1.3 mg/dL — ABNORMAL LOW (ref 1.7–2.4)

## 2020-12-10 MED ORDER — WARFARIN SODIUM 1 MG PO TABS
1.0000 mg | ORAL_TABLET | Freq: Once | ORAL | Status: AC
  Administered 2020-12-10: 17:00:00 1 mg via ORAL
  Filled 2020-12-10: qty 1

## 2020-12-10 MED ORDER — ZINC OXIDE 40 % EX OINT
TOPICAL_OINTMENT | Freq: Four times a day (QID) | CUTANEOUS | Status: DC | PRN
  Filled 2020-12-10: qty 57

## 2020-12-10 MED ORDER — MAGNESIUM SULFATE 2 GM/50ML IV SOLN
2.0000 g | Freq: Once | INTRAVENOUS | Status: AC
  Administered 2020-12-10: 13:00:00 2 g via INTRAVENOUS
  Filled 2020-12-10: qty 50

## 2020-12-10 MED ORDER — LACTULOSE 10 GM/15ML PO SOLN
30.0000 g | Freq: Four times a day (QID) | ORAL | Status: DC
  Administered 2020-12-10 – 2020-12-11 (×4): 30 g via ORAL
  Filled 2020-12-10 (×4): qty 45

## 2020-12-10 NOTE — Progress Notes (Signed)
Initial Nutrition Assessment  DOCUMENTATION CODES:   Not applicable  INTERVENTION:   -D/c Ensure Enlive po BID, each supplement provides 350 kcal and 20 grams of protein -Magic cup TID with meals, each supplement provides 290 kcal and 9 grams of protein -MVI with minerals daily  NUTRITION DIAGNOSIS:   Increased nutrient needs related to chronic illness (advanced decompensated cirrhosis) as evidenced by estimated needs  GOAL:   Patient will meet greater than or equal to 90% of their needs  MONITOR:   PO intake,Supplement acceptance,Labs,Weight trends,Skin,I & O's  REASON FOR ASSESSMENT:   Low Braden    ASSESSMENT:   49 year old female with past medical history of diabetes mellitus type 2, hyperlipidemia, 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  Pt admitted with AKI.  Reviewed I/O's: +690 ml x 24 hours and +2 L since admission  UOP: 300 ml x 24 hours  Case discussed with nurse tech, who had just cleaned up pt. She reports pt is very confused and difficult to re-direct.   Spoke with pt at bedside, who reports she has a good appetite. She states she consumed almost all of her bacon and cheese omelette this morning. PTA pt reports good meal intake, consuming 3 meals per day, but unable to provide accurate diet history. Documented meal completion 50-75%.   Pt unsure if she has lost weight. Reviewed wt hx; pt has experienced a 24% wt loss over the past month, which is significant for time frame. Suspect some wt loss may be related to ascites. RD is familiar with pt due to prior admission and looks more frail in comparison to last  hospitalization.   Pt has Ensure supplements ordered, however, pt refusing. RD will try Magic Cups.  Medications reviewed and include folic acid and lactulose.  Lab Results  Component Value Date   HGBA1C 4.0 (L) 11/13/2020   PTA DM medications are 1.8 mg victoza daily and 500 mg metformin BID.   Labs reviewed: CBGS: 116-152 (inpatient orders for glycemic control are 0-9 units insulin aspart TID before meals and at bedtime).   NUTRITION - FOCUSED PHYSICAL EXAM:  Flowsheet Row Most Recent Value  Orbital Region No depletion  Upper Arm Region No depletion  Thoracic and Lumbar Region No depletion  Buccal Region No depletion  Temple Region No depletion  Clavicle Bone Region No depletion  Clavicle and Acromion Bone Region No depletion  Scapular Bone Region No depletion  Dorsal Hand No depletion  Patellar Region Mild depletion  Anterior Thigh Region Mild depletion  Posterior Calf Region Mild depletion  Edema (RD Assessment) None  Hair Reviewed  Eyes Reviewed  Mouth Reviewed  Skin Reviewed  Nails Reviewed       Diet Order:   Diet Order            DIET SOFT Room service appropriate? Yes with Assist; Fluid consistency: Thin  Diet effective now                 EDUCATION NEEDS:   Education needs have been addressed  Skin:  Skin Assessment: Reviewed RN Assessment  Last BM:  12/10/20  Height:   Ht Readings from Last 1 Encounters:  12/03/20 5\' 2"  (  1.575 m)    Weight:   Wt Readings from Last 1 Encounters:  12/03/20 59 kg    Ideal Body Weight:  50 kg  BMI:  Body mass index is 23.78 kg/m.  Estimated Nutritional Needs:   Kcal:  2050-2250  Protein:  105-120 grams  Fluid:  > 2 L    Levada Schilling, RD, LDN, CDCES Registered Dietitian II Certified Diabetes Care and Education Specialist Please refer to [facility] for RD and/or RD on-call/weekend/after hours pager

## 2020-12-10 NOTE — Progress Notes (Signed)
ANTICOAGULATION CONSULT NOTE - Initial Consult  Pharmacy Consult for warfarin Indication: protein C and S deficiency  No Known Allergies  Patient Measurements: Height: 5\' 2"  (157.5 cm) Weight: 59 kg (130 lb) IBW/kg (Calculated) : 50.1   Vital Signs: Temp: 98.3 F (36.8 C) (12/17 0329) Temp Source: Oral (12/17 0329) BP: 129/82 (12/17 0329) Pulse Rate: 81 (12/17 0329)  Labs: Recent Labs    12/08/20 0120 12/09/20 0056 12/10/20 0124  HGB 7.0* 7.5* 7.9*  HCT 19.5* 21.2* 24.5*  PLT 140* PLATELET CLUMPS NOTED ON SMEAR, UNABLE TO ESTIMATE PLATELET CLUMPS NOTED ON SMEAR, UNABLE TO ESTIMATE  LABPROT 21.0*  --  19.9*  INR 1.9*  --  1.8*  CREATININE 4.04* 3.33* 2.49*    Estimated Creatinine Clearance: 22.1 mL/min (A) (by C-G formula based on SCr of 2.49 mg/dL (H)).   Medical History: Past Medical History:  Diagnosis Date  . Diabetes mellitus without complication (HCC)   . Fatty liver   . Hyperlipidemia   . Protein C deficiency (HCC)   . Protein S deficiency (HCC)     Medications:  Medications Prior to Admission  Medication Sig Dispense Refill Last Dose  . ezetimibe (ZETIA) 10 MG tablet Take 10 mg by mouth daily.   12/02/2020 at Unknown time  . feeding supplement (ENSURE ENLIVE / ENSURE PLUS) LIQD Take 237 mLs by mouth 3 (three) times daily between meals. 237 mL 12 Past Week at Unknown time  . folic acid (FOLVITE) 1 MG tablet Take 1 tablet (1 mg total) by mouth daily. 30 tablet 3 12/02/2020 at Unknown time  . furosemide (LASIX) 20 MG tablet Take 1 tablet (20 mg total) by mouth daily. 30 tablet 0 12/02/2020 at Unknown time  . metFORMIN (GLUCOPHAGE) 500 MG tablet Take 500 mg by mouth 2 (two) times daily.   12/02/2020 at Unknown time  . mirtazapine (REMERON) 15 MG tablet Take 7.5 mg by mouth at bedtime.   12/01/2020 at Unknown time  . Multiple Vitamin (MULTIVITAMIN WITH MINERALS) TABS tablet Take 1 tablet by mouth daily. 30 tablet 0 12/02/2020 at Unknown time  . oxycodone (OXY-IR)  5 MG capsule Take 1 capsule (5 mg total) by mouth every 4 (four) hours as needed for pain. 15 capsule 0 12/02/2020 at Unknown time  . pantoprazole (PROTONIX) 40 MG tablet Take 1 tablet (40 mg total) by mouth 2 (two) times daily. 60 tablet 0 12/02/2020 at Unknown time  . spironolactone (ALDACTONE) 25 MG tablet Take 1 tablet (25 mg total) by mouth daily. 30 tablet 0 12/02/2020 at Unknown time  . VICTOZA 18 MG/3ML SOPN Inject 1.8 mg into the skin daily. Sliding scale   12/02/2020 at Unknown time  . XARELTO 20 MG TABS tablet Take 20 mg by mouth at bedtime.   12/01/2020 at 2130  . lactulose (CHRONULAC) 10 GM/15ML solution Take 45 mLs (30 g total) by mouth 2 (two) times daily. 236 mL 0    Scheduled:  . feeding supplement  237 mL Oral BID BM  . folic acid  1 mg Oral Daily  . insulin aspart  0-9 Units Subcutaneous TID AC & HS  . lactulose  30 g Oral TID  . midodrine  5 mg Oral TID WC  . multivitamin with minerals  1 tablet Oral Daily  . pantoprazole sodium  40 mg Oral BID  . rifaximin  550 mg Oral BID  . Warfarin - Pharmacist Dosing Inpatient   Does not apply q1600    Assessment: 49 yo female  on xarelto PTA (last dose was 12/01/2018) for protein C and S deficiency.  She is noted with AKI and decompensated hepatic cirrhosis (child pugh class= c). Due to liver decompensation plans are to change to warfarin. She may be very sensitive to warfarin so will start conservatively.  -INR= 1.8, hg= 7.9 -t bili= 8.6  Goal of Therapy:  INR 2-3 Monitor platelets by anticoagulation protocol: Yes   Plan:  -Warfarin 1mg  po today -Daily PT/INR  , PharmD Clinical Pharmacist **Pharmacist phone directory can now be found on [email or web address] ([email or web address]).  Listed under [facility].

## 2020-12-10 NOTE — Progress Notes (Signed)
Pt's hemodynamically stable, remained afebrile, alert, but confused at time and situations, oriented to self and place. She kept pulling monitor out and kept trying to get out of bed, stayed awake all night, persistently had incontinent of moderate and small watery bowel movement multiple times tonight. Skin around her perineal area appeared red and irritated due to MASD. Cleaned and skin barrier cream applied.   Requested 3-4 cups of  water and cranberry juice to drink.  Evidently she ate less than 30% of her meal. Encouraged oral intake more.   We continue to monitor.  Filiberto Pinks, RN

## 2020-12-10 NOTE — Progress Notes (Signed)
PROGRESS NOTE  Shelby Conner  DOB: [birth year]  PCP: Pcp, No TDD:[id number]  DOA: 12/02/2020  LOS: 8 days   Chief Complaint  Patient presents with  . Abnormal Lab    Brief narrative: Shelby Conner is a 49 y.o. female with PMH significant for T2DM HLD, protein C and S deficiency-on chronic Xarelto at home, advanced decompensated cirrhosis possible secondary to [place] versus ethanol abuse. Patient was sent to the ED on 12/9 by her PCP for abnormal labs-worsening kidney function.  In the ED, patient was found to have severe AKI with creatinine of 7.45, BUN: 124, calcium: 8.9, elevated AST of 100, ALT: 69.  Elevated bilirubin of 17.1 with slightly elevated alkaline phosphatase of 135.  WBC: 7.0, hemoglobin: 10.0, platelet: 209, INR: 4.3, ammonia: 143. Patient was admitted to hospitalist service.  Nephrology and GI consultations were obtained. Renal injury was treated with a combination of Ocutricin, midodrine and albumin. Renal function is gradually improving.  Subjective: Patient was seen and examined this morning. Lying on bed.  Remains very confused today.  She was on significant decline for evaluation.  Examined her at the presence of nurse. Clydie Braun, she is having a small amount of loose stool but no significant improvement in ammonia level in the morning lab.  Assessment/Plan: AKI: -Likely hepatorenal syndrome in the setting of decompensated liver cirrhosis. -On admission: BUN 124, creatinine: 7.45, GFR: 6  -Renal ultrasound negative.  Lasix and Aldactone remain on hold. -Nephrology consult appreciated. -Renal function is improving with administration of albumin, midodrine and octreotide. -Currently patient is not on diuretics and remains euvolemic.  Creatinine improving, 2.49 today. -Repeat labs tomorrow.  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  Follows up with GI as an outpatient.  See GI note for details on previous work-up.   -LFTs improving and is stable. -She has been referred to [facility] transplant center for outpatient transplant evaluation -Continue lactulose and rifaximin-no further GI recommendations.  GI signed off. -Patient has been inconsistently compliant to lactulose.  Encouraged to improve compliance. Recent Labs  Lab 12/06/20 1303 12/07/20 0117 12/08/20 0120 12/09/20 0056 12/10/20 0124  AST 80* 71* 64* 72* 80*  ALT 50* 44 39 41 43  ALKPHOS 118 121 118 126 131*  BILITOT 11.2* 10.2* 9.5* 9.2* 8.6*  PROT 7.3 7.3 7.2 8.1 8.4*  ALBUMIN 3.0* 2.9* 3.3* 4.0 3.9   Hepatic encephalopathy:  -Ammonia level is static at 113 today.  Mental status is poor.  Very confused, restless.  She had a rough night.  I will increase the frequency of lactulose to 4 times a day.  She may benefit from a fecal tube if nursing desires.  Continue Xifaxan. -Continue to monitor ammonia level. Recent Labs  Lab 12/06/20 1349 12/07/20 0149 12/08/20 0108 12/09/20 0056 12/10/20 0124  AMMONIA 169* 114* 136* 112* 113*   Macrocytic  anemia Grade C esophagitis -Hemoglobin at baseline between 8-10.  EGD last month was negative for varices it shows esophagitis.  Monitor H&H closely and transfuse as needed. -Continue Protonix twice daily. Recent Labs    12/02/20 1444 12/02/20 1832 12/03/20 0351 12/04/20 0636 12/05/20 0207 12/06/20 1303 12/07/20 0117 12/08/20 0120 12/09/20 0056 12/10/20 0124  HGB  10.0* 9.9* 7.6* 8.7* 8.2* 8.0* 7.9* 7.0* 7.5* 7.9*   Coagulopathy History of protein C&S deficiency -According to patient's husband, she has been on anticoagulation for several years.  Mostly on Coumadin until a year ago when she  was switched to Xarelto.  Since the diagnosis of liver cirrhosis and last few months, patient has continued to remain on Xarelto. D/w pharmacist.  Technically, a DOAC is not indicated in case of liver cirrhosis.  Coumadin would be the only other option.  Discussed with patient's husband.  He agrees to get her switched back to Coumadin.  Coumadin initiated.  Anion gap metabolic acidosis: Likely in the setting of worsening kidney function, bicarb 8, anion gap 23 on admission.  Resolved -Received IV sodium bicarb on admission, continue p.o. sodium bicarb-as per nephrology recommendations.  Type 2 diabetes mellitus:  -A1c 4 on 11/20.  Metformin on hold. -Currently fingerstick blood sugars running consistently less than 150. Recent Labs  Lab 12/09/20 0600 12/09/20 1155 12/09/20 1639 12/09/20 2138 12/10/20 0544  GLUCAP 127* 105* 152* 123* 133*   Hyponatremia: Resolved Recent Labs  Lab 12/03/20 2228 12/04/20 0636 12/05/20 0207 12/05/20 1031 12/06/20 1303 12/07/20 0117 12/08/20 0120 12/09/20 0056 12/10/20 0124  NA 124* 130* 129* 130* 133* 136 141 145 142   Hypokalemia/hypomagnesemia -Magnesium level low at 1.3 today.  IV replacement ordered. Recent Labs  Lab 12/04/20 0636 12/05/20 0207 12/06/20 1303 12/07/20 0117 12/08/20 0120 12/09/20 0056 12/10/20 0124  K 2.2*   < > 3.3* 3.5 3.2* 3.2* 3.5  MG 2.2  --  1.8 1.8  --   --  1.3*  PHOS  --   --   --   --  4.0 3.5 3.3   < > = values in this interval not displayed.   Prognosis: Overall poor prognosis-family is aware of poor prognosis  Mobility: Encourage ambulation Code Status:   Code Status: Full Code  Nutritional status: Body mass index is 23.78 kg/m.     Diet Order            DIET SOFT Room service appropriate? Yes with Assist; Fluid consistency: Thin  Diet effective now                 DVT prophylaxis: SCDs  Antimicrobials:  None Fluid: None Consultants: GI Family Communication:  Mother-in-law at bedside.   Discussed with husband on the phone.  Status is: Inpatient  Remains inpatient appropriate because: Remains significantly confused.  Lactulose dose increased.  Dispo: The patient is from: Home              Anticipated d/c is to: Home once mental status improves              Anticipated d/c date is: 2 to 3 days              Patient currently is not medically stable to d/c.  Infusions:    Scheduled Meds: . feeding supplement  237 mL Oral BID BM  . folic acid  1 mg Oral Daily  . insulin aspart  0-9 Units Subcutaneous TID AC & HS  . lactulose  30 g Oral QID  . midodrine  5 mg Oral TID WC  . multivitamin with minerals  1 tablet Oral Daily  . pantoprazole sodium  40 mg Oral BID  . rifaximin  550 mg Oral BID  . warfarin  1 mg Oral ONCE-1600  . Warfarin - Pharmacist Dosing Inpatient   Does not apply q1600  Antimicrobials: Anti-infectives (From admission, onward)   Start     Dose/Rate Route Frequency Ordered Stop   12/02/20 2200  rifaximin (XIFAXAN) tablet 550 mg        550 mg Oral 2 times daily 12/02/20 2151        PRN meds: liver oil-zinc oxide, ondansetron **OR** ondansetron (ZOFRAN) IV, oxyCODONE, simethicone   Objective: Vitals:   12/10/20 0329 12/10/20 0758  BP: 129/82 129/78  Pulse: 81 88  Resp: 15 16  Temp: 98.3 F (36.8 C) 98.4 F (36.9 C)  SpO2: 96% 98%    Intake/Output Summary (Last 24 hours) at 12/10/2020 1127 Last data filed at 12/10/2020 0000 Gross per 24 hour  Intake 750 ml  Output 300 ml  Net 450 ml   Filed Weights   12/03/20 0625  Weight: 59 kg   Weight change:  Body mass index is 23.78 kg/m.   Physical Exam: General exam: Middle-aged Caucasian female. Not in physical distress but remains confused, restless Skin: No rashes, lesions or ulcers. HEENT: Yellow sclera Lungs: Clear to auscultation bilaterally CVS: Regular rate and rhythm, no murmur GI/Abd soft, nontender, nondistended, bowel sound present CNS: Alert, awake, totally  disoriented today.  Restless. Psychiatry: Depressed look Extremities: No pedal edema, no calf tenderness  Data Review: I have personally reviewed the laboratory data and studies available.  Recent Labs  Lab 12/06/20 1303 12/07/20 0117 12/08/20 0120 12/09/20 0056 12/10/20 0124  WBC 12.7* 12.4* 10.7* 11.8* 12.7*  NEUTROABS  --   --   --  9.0* 9.9*  HGB 8.0* 7.9* 7.0* 7.5* 7.9*  HCT 22.8* 22.4* 19.5* 21.2* 24.5*  MCV 104.1* 104.2* 104.8* 106.0* 111.4*  PLT 155 164 140* PLATELET CLUMPS NOTED ON SMEAR, UNABLE TO ESTIMATE PLATELET CLUMPS NOTED ON SMEAR, UNABLE TO ESTIMATE   Recent Labs  Lab 12/04/20 0636 12/05/20 0207 12/06/20 1303 12/07/20 0117 12/08/20 0120 12/09/20 0056 12/10/20 0124  NA 130*   < > 133* 136 141 145 142  K 2.2*   < > 3.3* 3.5 3.2* 3.2* 3.5  CL 92*   < > 97* 99 101 104 105  CO2 19*   < > 20* 22 23 25 25   GLUCOSE 163*   < > 157* 121* 138* 145* 173*  BUN 112*   < > 98* 99* 88* 70* 52*  CREATININE 6.47*   < > 5.18* 4.91* 4.04* 3.33* 2.49*  CALCIUM 9.1   < > 9.5 9.8 9.9 10.2 10.1  MG 2.2  --  1.8 1.8  --   --  1.3*  PHOS  --   --   --   --  4.0 3.5 3.3   < > = values in this interval not displayed.    F/u labs ordered  Signed, , MD [facility] 12/10/2020

## 2020-12-11 LAB — COMPREHENSIVE METABOLIC PANEL
ALT: 39 U/L (ref 0–44)
AST: 70 U/L — ABNORMAL HIGH (ref 15–41)
Albumin: 3.3 g/dL — ABNORMAL LOW (ref 3.5–5.0)
Alkaline Phosphatase: 126 U/L (ref 38–126)
Anion gap: 15 (ref 5–15)
BUN: 44 mg/dL — ABNORMAL HIGH (ref 6–20)
CO2: 22 mmol/L (ref 22–32)
Calcium: 9.7 mg/dL (ref 8.9–10.3)
Chloride: 102 mmol/L (ref 98–111)
Creatinine, Ser: 1.89 mg/dL — ABNORMAL HIGH (ref 0.44–1.00)
GFR, Estimated: 33 mL/min — ABNORMAL LOW (ref >60)
Glucose, Bld: 125 mg/dL — ABNORMAL HIGH (ref 70–99)
Potassium: 3.3 mmol/L — ABNORMAL LOW (ref 3.5–5.1)
Sodium: 139 mmol/L (ref 135–145)
Total Bilirubin: 8 mg/dL — ABNORMAL HIGH (ref 0.3–1.2)
Total Protein: 7.8 g/dL (ref 6.5–8.1)

## 2020-12-11 LAB — CBC WITH DIFFERENTIAL/PLATELET
Abs Immature Granulocytes: 0.07 10*3/uL (ref 0.00–0.07)
Basophils Absolute: 0.1 10*3/uL (ref 0.0–0.1)
Basophils Relative: 1 %
Eosinophils Absolute: 0.2 10*3/uL (ref 0.0–0.5)
Eosinophils Relative: 2 %
HCT: 21.9 % — ABNORMAL LOW (ref 36.0–46.0)
Hemoglobin: 7.1 g/dL — ABNORMAL LOW (ref 12.0–15.0)
Immature Granulocytes: 1 %
Lymphocytes Relative: 13 %
Lymphs Abs: 1.6 10*3/uL (ref 0.7–4.0)
MCH: 35.7 pg — ABNORMAL HIGH (ref 26.0–34.0)
MCHC: 32.4 g/dL (ref 30.0–36.0)
MCV: 110.1 fL — ABNORMAL HIGH (ref 80.0–100.0)
Monocytes Absolute: 0.6 10*3/uL (ref 0.1–1.0)
Monocytes Relative: 5 %
Neutro Abs: 10.4 10*3/uL — ABNORMAL HIGH (ref 1.7–7.7)
Neutrophils Relative %: 78 %
Platelets: UNDETERMINED 10*3/uL (ref 150–400)
RBC: 1.99 MIL/uL — ABNORMAL LOW (ref 3.87–5.11)
RDW: 21.9 % — ABNORMAL HIGH (ref 11.5–15.5)
WBC: 13.1 10*3/uL — ABNORMAL HIGH (ref 4.0–10.5)
nRBC: 0 % (ref 0.0–0.2)

## 2020-12-11 LAB — PROTIME-INR
INR: 1.9 — ABNORMAL HIGH (ref 0.8–1.2)
Prothrombin Time: 20.9 seconds — ABNORMAL HIGH (ref 11.4–15.2)

## 2020-12-11 LAB — AMMONIA: Ammonia: 63 umol/L — ABNORMAL HIGH (ref 9–35)

## 2020-12-11 LAB — GLUCOSE, CAPILLARY
Glucose-Capillary: 115 mg/dL — ABNORMAL HIGH (ref 70–99)
Glucose-Capillary: 191 mg/dL — ABNORMAL HIGH (ref 70–99)

## 2020-12-11 LAB — PHOSPHORUS: Phosphorus: 3.8 mg/dL (ref 2.5–4.6)

## 2020-12-11 MED ORDER — LACTULOSE 10 GM/15ML PO SOLN: 30.0000 g | Freq: Four times a day (QID) | ORAL | 0 refills | Status: AC

## 2020-12-11 MED ORDER — MIDODRINE HCL 5 MG PO TABS: 5.0000 mg | ORAL_TABLET | Freq: Three times a day (TID) | ORAL | 0 refills | Status: AC

## 2020-12-11 MED ORDER — WARFARIN SODIUM 1 MG PO TABS: 1.0000 mg | ORAL_TABLET | Freq: Every day | ORAL | 0 refills | Status: DC

## 2020-12-11 MED ORDER — WARFARIN SODIUM 1 MG PO TABS: 1.0000 mg | ORAL_TABLET | Freq: Once | ORAL | Status: AC

## 2020-12-11 MED ORDER — RIFAXIMIN 550 MG PO TABS: 550.0000 mg | ORAL_TABLET | Freq: Two times a day (BID) | ORAL | 0 refills | Status: AC

## 2020-12-11 MED ORDER — POTASSIUM CHLORIDE CRYS ER 20 MEQ PO TBCR
40.0000 meq | EXTENDED_RELEASE_TABLET | Freq: Once | ORAL | Status: AC
  Administered 2020-12-11: 09:00:00 40 meq via ORAL
  Filled 2020-12-11: qty 2

## 2020-12-11 NOTE — TOC Transition Note (Signed)
Transition of Care [facility]) - CM/SW Discharge Note   Patient Details  Name: [name] MRN: [id number] Date of Birth: 1971/02/14  Transition of Care [facility]) CM/SW Contact:  Leone Haven, RN Phone Number: 12/11/2020, 12:42 PM   Clinical Narrative:    Patient for dc today, NCM received referral for HHPT, NCM offered choice, she states she does not have a preference.  NCM made referral to [facility] with [facility].  He is able to take referral.  Patient will need to call [facility] to schedule a follow up apt , she has no PCP.  She states she has transportation at [employer].   Final next level of care: Home w Home Health Services Barriers to Discharge: No Barriers Identified   Patient Goals and CMS Choice Patient states their goals for this hospitalization and ongoing recovery are:: get better CMS Medicare.gov Compare Post Acute Care list provided to:: Patient Choice offered to / list presented to : Patient  Discharge Placement                       Discharge Plan and Services   Discharge Planning Services: CM Consult              DME Agency: NA       HH Arranged: PT HH Agency: [facility] Date [facility] Agency Contacted: 12/11/20 Time HH Agency Contacted: 1238 Representative spoke with at [facility] Agency: Kandee Keen  Social Determinants of Health (SDOH) Interventions     Readmission Risk Interventions Readmission Risk Prevention Plan 12/11/2020  Transportation Screening Complete  HRI or Home Care Consult Complete  Social Work Consult for Recovery Care Planning/Counseling Complete  Medication Review Oceanographer) Complete  Some recent data might be hidden

## 2020-12-11 NOTE — Progress Notes (Addendum)
D/c tele and iV and tele monitor. Went over AVS with pt and husband and all questions were answered. Pt went home with 2 printed prescription of xafaxan and lactulose.   Lawson Radar, RN

## 2020-12-11 NOTE — Plan of Care (Signed)

## 2020-12-11 NOTE — Discharge Summary (Signed)
Physician Discharge Summary  Shelby Conner YQM:[id number] DOB: 11/05/1971 DOA: 12/02/2020  PCP: Oneita Hurt, No  Admit date: 12/02/2020 Discharge date: 12/11/2020  Admitted From: Home Discharge disposition: Home with PT   Code Status: Full Code  Diet Recommendation: Sodium control, calorie restricted diet  Discharge Diagnosis:   Principal Problem:   AKI (acute kidney injury) (HCC) Active Problems:   Decompensated hepatic cirrhosis (HCC)   Hypokalemia   Protein S deficiency (HCC)   Protein C deficiency (HCC)   Type 2 diabetes mellitus without complication (HCC)   Acute hepatic encephalopathy   Metabolic acidosis   [place] grade C esophagitis   Coagulopathy (HCC)  Brief narrative: Shelby Conner is a 49 y.o. female with PMH significant for T2DM HLD, protein C and S deficiency-on chronic Xarelto at home, advanced decompensated cirrhosis possible secondary to [place] versus ethanol abuse. Patient was sent to the ED on 12/9 by her PCP for abnormal labs-worsening kidney function.  In the ED, patient was found to have severe AKI with creatinine of 7.45, BUN: 124, calcium: 8.9, elevated AST of 100, ALT: 69.  Elevated bilirubin of 17.1 with slightly elevated alkaline phosphatase of 135.  WBC: 7.0, hemoglobin: 10.0, platelet: 209, INR: 4.3, ammonia: 143. Patient was admitted to hospitalist service.  Nephrology and GI consultations were obtained. Renal injury was treated with a combination of Ocutricin, midodrine and albumin. Renal function is gradually improving.  Subjective: Patient was seen and examined this morning. Sitting up in bed.  Husband at bedside.  Patient's mental status is much clear this morning.  Ammonia level is down.  Wants to go home.  Hospital course: AKI Anion gap metabolic acidosis -Likely hepatorenal syndrome in the setting of decompensated liver cirrhosis. -On admission: BUN 124, creatinine: 7.45, GFR: 6  -Renal ultrasound negative.  Lasix and  Aldactone were kept on hold. -Nephrology consult appreciated. -Renal function and metabolic acidosis improved with administration of albumin, midodrine and octreotide. -Currently patient is not on diuretics and remains euvolemic.  Creatinine improving steadily, 1.89 today. -Repeat labs as an outpatient..  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  Follows up with GI as an outpatient.  See GI note for details on previous work-up.   -LFTs improving and is stable. -She has been referred to [facility] transplant center for outpatient transplant evaluation -Prior to admission, patient was on Lasix 20 mg daily, Aldactone 25 mg daily and lactulose. -In this hospitalization Lasix and Aldactone were held because of AKI.  She remains euvolemic.  Will not resume diuretic at discharge.  I have advised the patient and husband to maintain steady/consistent amount of fluid daily.  She may require Lasix down the road.  Patient is currently getting midodrine to support blood pressure.  She will be discharged on the same. -Lactulose to continue.  Xifaxan has been added. -Strongly encourage compliance to lactulose to maintain 3-4 bowel movements a day. Recent Labs  Lab 12/07/20 0117 12/08/20 0120 12/09/20 0056 12/10/20 0124 12/11/20 0130  AST 71* 64* 72* 80* 70*  ALT 44 39 41 43 39  ALKPHOS 121 118 126 131* 126  BILITOT 10.2* 9.5* 9.2* 8.6* 8.0*  PROT 7.3 7.2 8.1 8.4* 7.8  ALBUMIN  2.9* 3.3* 4.0 3.9 3.3*   Hepatic encephalopathy:  -This hospitalization was prolonged because of encephalopathy related to elevated ammonia level.  With improved compliance to lactulose, ammonia level has improved  and patient is now in a better mental status, ready to go home.  As mentioned above.  Patient to continue lactulose and Xifaxan at home. Recent Labs  Lab 12/07/20 0149 12/08/20 0108 12/09/20 0056 12/10/20 0124 12/11/20 0130  AMMONIA 114* 136* 112* 113* 63*   Macrocytic  anemia Grade C esophagitis -Hemoglobin at baseline between 8-10.  EGD last month was negative for varices it shows esophagitis. -Continue Protonix twice daily. Recent Labs    12/02/20 1832 12/03/20 0351 12/04/20 0636 12/05/20 0207 12/06/20 1303 12/07/20 0117 12/08/20 0120 12/09/20 0056 12/10/20 0124 12/11/20 0130  HGB 9.9* 7.6* 8.7* 8.2* 8.0* 7.9* 7.0* 7.5* 7.9* 7.1*   Coagulopathy History of protein C&S deficiency -According to patient's husband, patient has been on anticoagulation for several years.  Mostly on Coumadin until a year ago when she was switched to Xarelto.  Since the diagnosis of liver cirrhosis in last few months, patient has continued to remain on Xarelto. D/w pharmacist.  Technically, a DOAC is not indicated in case of liver cirrhosis.  Coumadin would be the only other option.  She has been initiated on Coumadin.  INR 1.9, close to a therapeutic target of 2-3.  We will discharge her home on Coumadin 1 mg daily.  Need to continue to monitor INR as an outpatient.  Type 2 diabetes mellitus:  -A1c 4 on 11/20.  Right admission, patient was on Victoza and Metformin.  Both of them are currently held. -Currently fingerstick blood sugars running consistently less than 150 without any insulin treatment. -I would not resume Victoza or Metformin at discharge. Recent Labs  Lab 12/10/20 0544 12/10/20 1220 12/10/20 1549 12/10/20 2139 12/11/20 0603  GLUCAP 133* 116* 148* 117* 115*   Hyponatremia:  Resolved Recent Labs  Lab 12/05/20 0207 12/05/20 1031 12/06/20 1303 12/07/20 0117 12/08/20 0120 12/09/20 0056 12/10/20 0124 12/11/20 0130  NA 129* 130* 133* 136 141 145 142 139    Hypokalemia/hypomagnesemia -Magnesium level low at 1.3 today.  IV replacement ordered. Recent Labs  Lab 12/06/20 1303 12/07/20 0117 12/08/20 0120 12/09/20 0056 12/10/20 0124 12/11/20 0130  K 3.3* 3.5 3.2* 3.2* 3.5 3.3*  MG 1.8 1.8  --   --  1.3*  --   PHOS  --   --  4.0 3.5 3.3 3.8   Prognosis: Overall poor prognosis-family is aware of poor prognosis  Stable for discharge to home today  Wound care:    Discharge Exam:   Vitals:   12/10/20 2025 12/10/20 2310 12/11/20 0432 12/11/20 0915  BP: 138/79 135/75 133/74 129/72  Pulse:  88 79 88  Resp: Temp: 98.4 F (36.9 C) 98.6 F (37 C) 98.4 F (36.9 C) 98.4 F (36.9 C)  TempSrc: Oral Oral Oral Oral  SpO2: 92% 95% 95% 99%  Weight:      Height:        Body mass index is 23.78 kg/m.  General exam: Pleasant, middle-aged Caucasian female.  Not in distress Skin: No rashes, lesions or ulcers. HEENT: Atraumatic, normocephalic, no obvious bleeding, jaundiced sclera Lungs: Clear to auscultation bilaterally CVS: Regular rate and rhythm, no murmur GI/Abd soft, nontender, nondistended, bowel sound present CNS: Alert, awake, oriented x3 Psychiatry: Mood appropriate Extremities: No pedal edema, no calf tenderness  Follow ups:   Discharge Instructions  Diet - low sodium heart healthy   Complete by: As directed    Diet Carb Modified   Complete by: As directed    Increase activity slowly   Complete by: As directed       Follow-up Information    [facility] Follow up.   Contact information: [address] [place] [id number] [phone]       Kathi Der, MD Follow up.   Specialty: Gastroenterology Contact information: [address] [place] [address] [place] [address] [phone]               Recommendations for Outpatient Follow-Up:   1. Follow-up with PCP, GI, nephrology as an outpatient  Discharge Instructions:  Follow with  Primary MD Pcp, No in 7 days   Get CBC/BMP/INR checked in next visit within 1 week by PCP or SNF MD ( we routinely change or add medications that can affect your baseline labs and fluid status, therefore we recommend that you get the mentioned basic workup next visit with your PCP, your PCP may decide not to get them or add new tests based on their clinical decision)  On your next visit with your PCP, please Get Medicines reviewed and adjusted.  Please request your PCP  to go over all Hospital Tests and Procedure/Radiological results at the follow up, please get all Hospital records sent to your Prim MD by signing hospital release before you go home.  Activity: As tolerated with Full fall precautions use walker/cane & assistance as needed  For Heart failure patients - Check your Weight same time everyday, if you gain over 2 pounds, or you develop in leg swelling, experience more shortness of breath or chest pain, call your Primary MD immediately. Follow Cardiac Low Salt Diet and 1.5 lit/day fluid restriction.  If you have smoked or chewed Tobacco in the last 2 yrs please stop smoking, stop any regular Alcohol  and or any Recreational drug use.  If you experience worsening of your admission symptoms, develop shortness of breath, life threatening emergency, suicidal or homicidal thoughts you must seek medical attention immediately by calling 911 or calling your MD immediately  if symptoms less severe.  You Must read complete instructions/literature along with all the possible adverse reactions/side effects for all the Medicines you take and that have been prescribed to you. Take any new Medicines after you have completely understood and accpet all the possible adverse reactions/side effects.   Do not drive, operate heavy machinery, perform activities at heights, swimming or participation in water activities or provide baby sitting services if your were admitted for syncope or siezures until you have  seen by Primary MD or a Neurologist and advised to do so again.  Do not drive when taking Pain medications.  Do not take more than prescribed Pain, Sleep and Anxiety Medications  Wear Seat belts while driving.   Please note You were cared for by a hospitalist during your hospital stay. If you have any questions about your discharge medications or the care you received while you were in the hospital after you are discharged, you can call the unit and asked to speak with the hospitalist on call if the hospitalist that took care of you is not available. Once you are discharged, your primary care physician will handle any further medical issues. Please note that NO REFILLS for any discharge medications will be authorized once you are discharged, as it is imperative that you return to  your primary care physician (or establish a relationship with a primary care physician if you do not have one) for your aftercare needs so that they can reassess your need for medications and monitor your lab values.    Allergies as of 12/11/2020   No Known Allergies     Medication List    STOP taking these medications   furosemide 20 MG tablet Commonly known as: Lasix   metFORMIN 500 MG tablet Commonly known as: GLUCOPHAGE   oxycodone 5 MG capsule Commonly known as: OXY-IR   spironolactone 25 MG tablet Commonly known as: Aldactone   Victoza 18 MG/3ML Sopn Generic drug: liraglutide   Xarelto 20 MG Tabs tablet Generic drug: rivaroxaban     TAKE these medications   ezetimibe 10 MG tablet Commonly known as: ZETIA Take 10 mg by mouth daily.   feeding supplement Liqd Take 237 mLs by mouth 3 (three) times daily between meals.   folic acid 1 MG tablet Commonly known as: FOLVITE Take 1 tablet (1 mg total) by mouth daily.   lactulose 10 GM/15ML solution Commonly known as: CHRONULAC Take 45 mLs (30 g total) by mouth 4 (four) times daily. What changed: when to take this   midodrine 5 MG  tablet Commonly known as: PROAMATINE Take 1 tablet (5 mg total) by mouth 3 (three) times daily with meals.   mirtazapine 15 MG tablet Commonly known as: REMERON Take 7.5 mg by mouth at bedtime.   multivitamin with minerals Tabs tablet Take 1 tablet by mouth daily.   pantoprazole 40 MG tablet Commonly known as: PROTONIX Take 1 tablet (40 mg total) by mouth 2 (two) times daily.   rifaximin 550 MG Tabs tablet Commonly known as: XIFAXAN Take 1 tablet (550 mg total) by mouth 2 (two) times daily.   warfarin 1 MG tablet Commonly known as: Coumadin Take 1 tablet (1 mg total) by mouth daily.       Time coordinating discharge: 35 minutes  The results of significant diagnostics from this hospitalization (including imaging, microbiology, ancillary and laboratory) are listed below for reference.    Procedures and Diagnostic Studies:   US Renal  Result Date: 12/02/2020 CLINICAL DATA:  Initial evaluation for acute renal failure. EXAM: RENAL / URINARY TRACT ULTRASOUND COMPLETE COMPARISON:  None. FINDINGS: Right Kidney: Renal measurements: 11.3 x 4.2 x 5.4 cm = volume: 135.2 mL. Renal echogenicity within normal limits. No nephrolithiasis or hydronephrosis. No focal renal mass. Left Kidney: Renal measurements: 12.8 x 5.7 x 4.2 cm = volume: 159.5 mL. Renal echogenicity within normal limits. No nephrolithiasis or hydronephrosis. No focal renal mass. Bladder: Decompressed and not well evaluated. Other: Incidental note made of stones and/or sludge within the gallbladder lumen. IMPRESSION: 1. Normal renal ultrasound. No hydronephrosis or other significant finding. 2. Incidental cholelithiasis and/or gallbladder sludge. Electronically Signed   By: Rise Mu M.D.   On: 12/02/2020 18:38     Labs:   Basic Metabolic Panel: Recent Labs  Lab 12/06/20 1303 12/07/20 0117 12/08/20 0120 12/09/20 0056 12/10/20 0124 12/11/20 0130  NA 133* 136 141 145 142 139  K 3.3* 3.5 3.2* 3.2* 3.5 3.3*   CL 97* 99 101 104 105 102  CO2 20* GLUCOSE 157* 121* 138* 145* 173* 125*  BUN 98* 99* 88* 70* 52* 44*  CREATININE 5.18* 4.91* 4.04* 3.33* 2.49* 1.89*  CALCIUM 9.5 9.8 9.9 10.2 10.1 9.7  MG 1.8 1.8  --   --  1.3*  --  PHOS  --   --  4.0 3.5 3.3 3.8   GFR Estimated Creatinine Clearance: 29.1 mL/min (A) (by C-G formula based on SCr of 1.89 mg/dL (H)). Liver Function Tests: Recent Labs  Lab 12/07/20 0117 12/08/20 0120 12/09/20 0056 12/10/20 0124 12/11/20 0130  AST 71* 64* 72* 80* 70*  ALT 44 39 41 43 39  ALKPHOS 121 118 126 131* 126  BILITOT 10.2* 9.5* 9.2* 8.6* 8.0*  PROT 7.3 7.2 8.1 8.4* 7.8  ALBUMIN 2.9* 3.3* 4.0 3.9 3.3*   No results for input(s): LIPASE, AMYLASE in the last 168 hours. Recent Labs  Lab 12/07/20 0149 12/08/20 0108 12/09/20 0056 12/10/20 0124 12/11/20 0130  AMMONIA 114* 136* 112* 113* 63*   Coagulation profile Recent Labs  Lab 12/06/20 1303 12/07/20 0117 12/08/20 0120 12/10/20 0124 12/11/20 0130  INR 2.0* 1.9* 1.9* 1.8* 1.9*    CBC: Recent Labs  Lab 12/07/20 0117 12/08/20 0120 12/09/20 0056 12/10/20 0124 12/11/20 0130  WBC 12.4* 10.7* 11.8* 12.7* 13.1*  NEUTROABS  --   --  9.0* 9.9* 10.4*  HGB 7.9* 7.0* 7.5* 7.9* 7.1*  HCT 22.4* 19.5* 21.2* 24.5* 21.9*  MCV 104.2* 104.8* 106.0* 111.4* 110.1*  PLT 164 140* PLATELET CLUMPS NOTED ON SMEAR, UNABLE TO ESTIMATE PLATELET CLUMPS NOTED ON SMEAR, UNABLE TO ESTIMATE PLATELET CLUMPS NOTED ON SMEAR, UNABLE TO ESTIMATE   Cardiac Enzymes: No results for input(s): CKTOTAL, CKMB, CKMBINDEX, TROPONINI in the last 168 hours. BNP: Invalid input(s): POCBNP CBG: Recent Labs  Lab 12/10/20 0544 12/10/20 1220 12/10/20 1549 12/10/20 2139 12/11/20 0603  GLUCAP 133* 116* 148* 117* 115*   D-Dimer No results for input(s): DDIMER in the last 72 hours. Hgb A1c No results for input(s): HGBA1C in the last 72 hours. Lipid Profile No results for input(s): CHOL, HDL, LDLCALC, TRIG,  CHOLHDL, LDLDIRECT in the last 72 hours. Thyroid function studies No results for input(s): TSH, T4TOTAL, T3FREE, THYROIDAB in the last 72 hours.  Invalid input(s): FREET3 Anemia work up No results for input(s): VITAMINB12, FOLATE, FERRITIN, TIBC, IRON, RETICCTPCT in the last 72 hours. Microbiology Recent Results (from the past 240 hour(s))  Resp Panel by RT-PCR (Flu A&B, Covid) Nasopharyngeal Swab     Status: None   Collection Time: 12/02/20 10:00 PM   Specimen: Nasopharyngeal Swab; Nasopharyngeal(NP) swabs in vial transport medium  Result Value Ref Range Status   SARS Coronavirus 2 by RT PCR NEGATIVE NEGATIVE Final    Comment: (NOTE) SARS-CoV-2 target nucleic acids are NOT DETECTED.  The SARS-CoV-2 RNA is generally detectable in upper respiratory specimens during the acute phase of infection. The lowest concentration of SARS-CoV-2 viral copies this assay can detect is 138 copies/mL. A negative result does not preclude SARS-Cov-2 infection and should not be used as the sole basis for treatment or other patient management decisions. A negative result may occur with  improper specimen collection/handling, submission of specimen other than nasopharyngeal swab, presence of viral mutation(s) within the areas targeted by this assay, and inadequate number of viral copies(<138 copies/mL). A negative result must be combined with clinical observations, patient history, and epidemiological information. The expected result is Negative.  Fact Sheet for Patients:  BloggerCourse.com  Fact Sheet for Healthcare Providers:  SeriousBroker.it  This test is no t yet approved or cleared by the [place] FDA and  has been authorized for detection and/or diagnosis of SARS-CoV-2 by FDA under an Emergency Use Authorization (EUA). This EUA will remain  in effect (meaning this test can be used) for  the duration of the COVID-19 declaration under Section  564(b)(1) of the Act, 21 U.S.C.section 360bbb-3(b)(1), unless the authorization is terminated  or revoked sooner.       Influenza A by PCR NEGATIVE NEGATIVE Final   Influenza B by PCR NEGATIVE NEGATIVE Final    Comment: (NOTE) The Xpert Xpress SARS-CoV-2/FLU/RSV plus assay is intended as an aid in the diagnosis of influenza from Nasopharyngeal swab specimens and should not be used as a sole basis for treatment. Nasal washings and aspirates are unacceptable for Xpert Xpress SARS-CoV-2/FLU/RSV testing.  Fact Sheet for Patients: BloggerCourse.comhttps://www.fda.gov/media/152166/download  Fact Sheet for Healthcare Providers: SeriousBroker.ithttps://www.fda.gov/media/152162/download  This test is not yet approved or cleared by the [place] FDA and has been authorized for detection and/or diagnosis of SARS-CoV-2 by FDA under an Emergency Use Authorization (EUA). This EUA will remain in effect (meaning this test can be used) for the duration of the COVID-19 declaration under Section 564(b)(1) of the Act, 21 U.S.C. section 360bbb-3(b)(1), unless the authorization is terminated or revoked.  Performed at [facility], [address] [place], [place] [address]   Blood culture (routine x 2)     Status: None   Collection Time: 12/02/20 10:20 PM   Specimen: BLOOD  Result Value Ref Range Status   Specimen Description BLOOD SITE NOT SPECIFIED  Final   Special Requests   Final    BOTTLES DRAWN AEROBIC AND ANAEROBIC Blood Culture results may not be optimal due to an inadequate volume of blood received in culture bottles   Culture   Final    NO GROWTH 5 DAYS Performed at [facility], [address] [place], [place] [address]    Report Status 12/08/2020 FINAL  Final  Culture, Urine     Status: None   Collection Time: 12/03/20  6:43 PM   Specimen: Urine, Random  Result Value Ref Range Status   Specimen Description URINE, RANDOM  Final   Special Requests NONE  Final   Culture   Final    NO  GROWTH Performed at [facility], [address] [place], [place] [address]    Report Status 12/05/2020 FINAL  Final  Culture, Urine     Status: Abnormal   Collection Time: 12/05/20 10:40 AM   Specimen: Urine, Clean Catch  Result Value Ref Range Status   Specimen Description URINE, CLEAN CATCH  Final   Special Requests   Final    NONE Performed at [facility], [address] [place], [place] [address]    Culture MULTIPLE SPECIES PRESENT, SUGGEST RECOLLECTION (A)  Final   Report Status 12/06/2020 FINAL  Final     Signed: Melina SchoolsBinaya Katrenia Alkins  [facility] 12/11/2020, 10:47 AM

## 2020-12-11 NOTE — TOC Initial Note (Signed)
Transition of Care [facility]) - Initial/Assessment Note    Patient Details  Name: Shelby Conner MRN: [id number] Date of Birth: 09-16-1971  Transition of Care [facility]) CM/SW Contact:    Leone Haven, RN Phone Number: 12/11/2020, 12:39 PM  Clinical Narrative:                 Patient for dc today, NCM received referral for HHPT, NCM offered choice, she states she does not have a preference.  NCM made referral to [facility] with [facility].  He is able to take referral.  Patient will need to call [facility] to schedule a follow up apt , she has no PCP.  She states she has transportation at [employer].   Expected Discharge Plan: Home w Home Health Services Barriers to Discharge: No Barriers Identified   Patient Goals and CMS Choice Patient states their goals for this hospitalization and ongoing recovery are:: get better CMS Medicare.gov Compare Post Acute Care list provided to:: Patient Choice offered to / list presented to : Patient  Expected Discharge Plan and Services Expected Discharge Plan: Home w Home Health Services   Discharge Planning Services: CM Consult   Living arrangements for the past 2 months: Single Family Home Expected Discharge Date: 12/11/20                 DME Agency: NA       HH Arranged: PT HH Agency: [facility] Date [facility] Agency Contacted: 12/11/20 Time HH Agency Contacted: 1238 Representative spoke with at [facility] Agency: Kandee Keen  Prior Living Arrangements/Services Living arrangements for the past 2 months: Single Family Home Lives with:: Spouse Patient language and need for interpreter reviewed:: Yes Do you feel safe going back to the place where you live?: Yes      Need for Family Participation in Patient Care: Yes (Comment) Care giver support system in place?: Yes (comment)   Criminal Activity/Legal Involvement Pertinent to Current Situation/Hospitalization: No - Comment as needed  Activities of Daily Living Home Assistive Devices/Equipment: None ADL  Screening (condition at time of admission) Patient's cognitive ability adequate to safely complete daily activities?: Yes Is the patient deaf or have difficulty hearing?: No Does the patient have difficulty seeing, even when wearing glasses/contacts?: No Does the patient have difficulty concentrating, remembering, or making decisions?: No Patient able to express need for assistance with ADLs?: Yes Does the patient have difficulty dressing or bathing?: No Independently performs ADLs?: Yes (appropriate for developmental age) Does the patient have difficulty walking or climbing stairs?: No Weakness of Legs: Both Weakness of Arms/Hands: None  Permission Sought/Granted                  Emotional Assessment Appearance:: Appears stated age Attitude/Demeanor/Rapport:  (Appropriate) Affect (typically observed): Appropriate Orientation: : Oriented to Self,Oriented to Place,Oriented to  Time,Oriented to Situation Alcohol / Substance Use: Not Applicable Psych Involvement: No (comment)  Admission diagnosis:  Acidosis [E87.2] Hypokalemia [E87.6] Hyponatremia [E87.1] Elevated INR [R79.1] Low bicarbonate [E87.8] AKI (acute kidney injury) (HCC) [N17.9] Increased ammonia level [R79.89] Acute renal failure, unspecified acute renal failure type (HCC) [N17.9] Decompensated liver disease (HCC) [K74.69] Acute kidney injury (AKI) with acute tubular necrosis (ATN) (HCC) [N17.0] Patient Active Problem List   Diagnosis Date Noted  . AKI (acute kidney injury) (HCC) 12/02/2020  . Acute hepatic encephalopathy 12/02/2020  . Metabolic acidosis 12/02/2020  . [place] grade C esophagitis 12/02/2020  . Coagulopathy (HCC) 12/02/2020  . Hypokalemia 11/12/2020  . Protein S deficiency (HCC) 11/12/2020  .  Protein C deficiency (HCC) 11/12/2020  . Ascites 11/12/2020  . Type 2 diabetes mellitus without complication (HCC) 11/12/2020  . Palliative care by specialist   . Goals of care, counseling/discussion    . Full code status   . Decompensated hepatic cirrhosis (HCC) 11/11/2020   PCP:  Pcp, No Pharmacy:   [id number] [place], [place] - [address] [place] [address] Phone: [phone] Fax: [phone]     Social Determinants of Health (SDOH) Interventions    Readmission Risk Interventions Readmission Risk Prevention Plan 12/11/2020  Transportation Screening Complete  HRI or Home Care Consult Complete  Social Work Consult for Recovery Care Planning/Counseling Complete  Medication Review Oceanographer) Complete  Some recent data might be hidden

## 2021-01-05 NOTE — Progress Notes (Deleted)
Patient ID: Shelby Conner, female   DOB: 03-08-1971, 50 y.o.   MRN: [id number]   After hospitalization from 12/9-12/18/2021  Principal Problem:   AKI (acute kidney injury) (HCC) Active Problems:   Decompensated hepatic cirrhosis (HCC)   Hypokalemia   Protein S deficiency (HCC)   Protein C deficiency (HCC)   Type 2 diabetes mellitus without complication (HCC)   Acute hepatic encephalopathy   Metabolic acidosis   [place] grade C esophagitis   Coagulopathy (HCC)  Brief narrative: Shelby Conner is a 50 y.o. female with PMH significant for T2DM HLD, protein C and S deficiency-on chronic Xarelto at home, advanced decompensated cirrhosis possible secondary to [place] versus ethanol abuse. Patient was sent to the ED on 12/9 by her PCP for abnormal labs-worsening kidney function.  In the ED, patient was found to have severe AKI with creatinine of 7.45, BUN: 124, calcium: 8.9, elevated AST of 100, ALT: 69. Elevated bilirubin of 17.1 with slightly elevated alkaline phosphatase of 135. WBC: 7.0, hemoglobin: 10.0, platelet: 209, INR: 4.3, ammonia: 143. Patient was admitted to hospitalist service.  Nephrology and GI consultations were obtained. Renal injury was treated with a combination of Ocutricin, midodrine and albumin. Renal function is gradually improving.  Hospital course: AKI Anion gap metabolic acidosis -Likely hepatorenal syndrome in the setting of decompensated liver cirrhosis. -On admission: BUN 124, creatinine: 7.45, GFR: 6  -Renal ultrasound negative.  Lasix and Aldactone were kept on hold. -Nephrology consult appreciated. -Renal function and metabolic acidosis improved with administration of albumin, midodrine and octreotide. -Currently patient is not on diuretics and remains euvolemic.  Creatinine improving steadily, 1.89 today. -Repeat labs as an outpatient..  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  Follows up with GI as an outpatient.  See GI note for details on previous work-up.   -LFTs improving and is stable. -She has been referred to [facility] transplant center for outpatient transplant evaluation -Prior to admission, patient was on Lasix 20 mg daily, Aldactone 25 mg daily and lactulose. -In this hospitalization Lasix and Aldactone were held because of AKI.  She remains euvolemic.  Will not resume diuretic at discharge.  I have advised the patient and husband to maintain steady/consistent amount of fluid daily.  She may require Lasix down the road.  Patient is currently getting midodrine to support blood pressure.  She will be discharged on the same. -Lactulose to continue.  Xifaxan has been added. -Strongly encourage compliance to lactulose to maintain 3-4 bowel movements a day. Last Labs   Recent Labs  Lab 12/07/20 0117 12/08/20 0120 12/09/20 0056 12/10/20 0124 12/11/20 0130  AST 71* 64* 72* 80* 70*  ALT 44 39 41 43 39  ALKPHOS 121 118 126 131* 126  BILITOT 10.2* 9.5* 9.2* 8.6* 8.0*  PROT 7.3 7.2 8.1 8.4* 7.8  ALBUMIN 2.9* 3.3* 4.0 3.9 3.3*     Hepatic encephalopathy: -This hospitalization was prolonged because of encephalopathy related to elevated ammonia level.  With improved compliance to lactulose, ammonia level has improved and patient is now in a better mental status,  ready to go home.  As mentioned above.  Patient to continue lactulose and Xifaxan at home. Last Labs   Recent Labs  Lab 12/07/20 0149 12/08/20 0108 12/09/20 0056 12/10/20 0124 12/11/20 0130  AMMONIA 114* 136* 112* 113* 63*     Macrocytic anemia Grade C  esophagitis -Hemoglobin at baseline between 8-10. EGD last month was negative for varices it shows esophagitis. -Continue Protonix twice daily. Recent Labs (within last 365 days)              Recent Labs    12/02/20 1832 12/03/20 0351 12/04/20 0636 12/05/20 0207 12/06/20 1303 12/07/20 0117 12/08/20 0120 12/09/20 0056 12/10/20 0124 12/11/20 0130  HGB 9.9* 7.6* 8.7* 8.2* 8.0* 7.9* 7.0* 7.5* 7.9* 7.1*     Coagulopathy History of protein C&S deficiency -According to patient's husband, patient has been on anticoagulation for several years.  Mostly on Coumadin until a year ago when she was switched to Xarelto.  Since the diagnosis of liver cirrhosis in last few months, patient has continued to remain on Xarelto. D/w pharmacist.  Technically, a DOAC is not indicated in case of liver cirrhosis.  Coumadin would be the only other option.  She has been initiated on Coumadin.  INR 1.9, close to a therapeutic target of 2-3.  We will discharge her home on Coumadin 1 mg daily.  Need to continue to monitor INR as an outpatient.  Type 2 diabetes mellitus: -A1c 4 on 11/20.  Right admission, patient was on Victoza and Metformin.  Both of them are currently held. -Currently fingerstick blood sugars running consistently less than 150 without any insulin treatment. -I would not resume Victoza or Metformin at discharge

## 2021-01-06 ENCOUNTER — Inpatient Hospital Stay: Payer: BC Managed Care – PPO | Admitting: Physician Assistant

## 2021-01-06 DIAGNOSIS — E119 Type 2 diabetes mellitus without complications: Secondary | ICD-10-CM

## 2021-06-28 ENCOUNTER — Other Ambulatory Visit: Payer: Self-pay | Admitting: Internal Medicine

## 2021-07-13 ENCOUNTER — Emergency Department (HOSPITAL_COMMUNITY): Payer: BC Managed Care – PPO

## 2021-07-13 ENCOUNTER — Emergency Department (HOSPITAL_COMMUNITY)
Admission: EM | Admit: 2021-07-13 | Discharge: 2021-07-14 | Discharge status: Against Medical Advice | Payer: BC Managed Care – PPO | Attending: Emergency Medicine | Admitting: Emergency Medicine

## 2021-07-13 DIAGNOSIS — Z5321 Procedure and treatment not carried out due to patient leaving prior to being seen by health care provider: Secondary | ICD-10-CM | POA: Insufficient documentation

## 2021-07-13 DIAGNOSIS — Y990 Civilian activity done for income or pay: Secondary | ICD-10-CM | POA: Diagnosis not present

## 2021-07-13 DIAGNOSIS — W01198A Fall on same level from slipping, tripping and stumbling with subsequent striking against other object, initial encounter: Secondary | ICD-10-CM | POA: Diagnosis not present

## 2021-07-13 DIAGNOSIS — Y908 Blood alcohol level of 240 mg/100 ml or more: Secondary | ICD-10-CM | POA: Diagnosis not present

## 2021-07-13 DIAGNOSIS — Z7901 Long term (current) use of anticoagulants: Secondary | ICD-10-CM | POA: Diagnosis not present

## 2021-07-13 DIAGNOSIS — Z79899 Other long term (current) drug therapy: Secondary | ICD-10-CM | POA: Diagnosis not present

## 2021-07-13 DIAGNOSIS — R55 Syncope and collapse: Secondary | ICD-10-CM | POA: Insufficient documentation

## 2021-07-13 LAB — COMPREHENSIVE METABOLIC PANEL
ALT: 47 U/L — ABNORMAL HIGH (ref 0–44)
AST: 50 U/L — ABNORMAL HIGH (ref 15–41)
Albumin: 4.1 g/dL (ref 3.5–5.0)
Alkaline Phosphatase: 143 U/L — ABNORMAL HIGH (ref 38–126)
Anion gap: 13 (ref 5–15)
BUN: 10 mg/dL (ref 6–20)
CO2: 19 mmol/L — ABNORMAL LOW (ref 22–32)
Calcium: 9.4 mg/dL (ref 8.9–10.3)
Chloride: 106 mmol/L (ref 98–111)
Creatinine, Ser: 0.76 mg/dL (ref 0.44–1.00)
GFR, Estimated: >60 mL/min (ref >60)
Glucose, Bld: 134 mg/dL — ABNORMAL HIGH (ref 70–99)
Potassium: 3.9 mmol/L (ref 3.5–5.1)
Sodium: 138 mmol/L (ref 135–145)
Total Bilirubin: 1 mg/dL (ref 0.3–1.2)
Total Protein: 9 g/dL — ABNORMAL HIGH (ref 6.5–8.1)

## 2021-07-13 LAB — CBC WITH DIFFERENTIAL/PLATELET
Abs Immature Granulocytes: 0.04 10*3/uL (ref 0.00–0.07)
Basophils Absolute: 0.1 10*3/uL (ref 0.0–0.1)
Basophils Relative: 1 %
Eosinophils Absolute: 0.3 10*3/uL (ref 0.0–0.5)
Eosinophils Relative: 3 %
HCT: 43.3 % (ref 36.0–46.0)
Hemoglobin: 14.4 g/dL (ref 12.0–15.0)
Immature Granulocytes: 0 %
Lymphocytes Relative: 32 %
Lymphs Abs: 3.1 10*3/uL (ref 0.7–4.0)
MCH: 30.8 pg (ref 26.0–34.0)
MCHC: 33.3 g/dL (ref 30.0–36.0)
MCV: 92.7 fL (ref 80.0–100.0)
Monocytes Absolute: 0.3 10*3/uL (ref 0.1–1.0)
Monocytes Relative: 3 %
Neutro Abs: 5.9 10*3/uL (ref 1.7–7.7)
Neutrophils Relative %: 61 %
Platelets: 193 10*3/uL (ref 150–400)
RBC: 4.67 MIL/uL (ref 3.87–5.11)
RDW: 14.3 % (ref 11.5–15.5)
WBC: 9.7 10*3/uL (ref 4.0–10.5)
nRBC: 0 % (ref 0.0–0.2)

## 2021-07-13 LAB — PROTIME-INR
INR: 2.9 — ABNORMAL HIGH (ref 0.8–1.2)
Prothrombin Time: 30.2 seconds — ABNORMAL HIGH (ref 11.4–15.2)

## 2021-07-13 LAB — I-STAT BETA HCG BLOOD, ED (MC, WL, AP ONLY): I-stat hCG, quantitative: <5 m[IU]/mL (ref <5)

## 2021-07-13 LAB — AMMONIA: Ammonia: 34 umol/L (ref 9–35)

## 2021-07-13 LAB — TROPONIN I (HIGH SENSITIVITY): Troponin I (High Sensitivity): 4 ng/L (ref <18)

## 2021-07-13 LAB — ETHANOL: Alcohol, Ethyl (B): 322 mg/dL (ref <10)

## 2021-07-13 NOTE — ED Notes (Signed)
Shelby Conner [phone]

## 2021-07-13 NOTE — ED Provider Notes (Signed)
Emergency Medicine Provider Triage Evaluation Note  Shelby Conner , a 50 y.o. female  was evaluated in triage.  Pt complains of syncopal episode while at work today.   She is anticoagulated on warfarin.  She does take lactulose, however, was on vacation and did not bring it with her so she has not had it for the last couple days. She thinks she passed out which caused her to fall.  She complains of pain to the neck, back, left upper arm, left hip.  Review of Systems  Positive: Possible syncope, possible head injury, pain to the neck, back, left arm, left hip Negative: Nausea, vomiting, chest pain, shortness of breath, abdominal pain  Physical Exam  BP 116/73 (BP Location: Left Arm)   Pulse 99   Temp 98.1 F (36.7 C) (Oral)   Resp 18   Ht 5\' 2"  (1.575 m)   Wt 59 kg   SpO2 99%   BMI 23.79 kg/m  Gen:   Awake, no distress  Resp:  Normal effort  MSK:   Moves extremities without difficulty  Other:  Midline spinal tenderness without noted deformity from the cervical spine through the lumbar spine.  No noted tenderness to the head. Tenderness to the left humerus without swelling or deformity noted.  Tenderness to left lateral hip.  Medical Decision Making  Medically screening exam initiated at 9:17 PM.  Appropriate orders placed.  Shelby Conner was informed that the remainder of the evaluation will be completed by another provider, this initial triage assessment does not replace that evaluation, and the importance of remaining in the ED until their evaluation is complete.     07/13/21 2121    2122, MD 07/13/21 [phone]

## 2021-07-13 NOTE — ED Triage Notes (Signed)
Brought in  by [facility] - pt had syncopal episode and hit her head (unknown downtime) - now having back pain. Pt on Warfarin.   Pt now GCS15. Pt on collar. G.20 right ac.

## 2021-07-13 NOTE — ED Notes (Signed)
Pt ran out of lactulose and rifaxin. Pt states her ammonia is high.

## 2021-07-14 LAB — RAPID URINE DRUG SCREEN, HOSP PERFORMED
Amphetamines: NOT DETECTED
Barbiturates: NOT DETECTED
Benzodiazepines: NOT DETECTED
Cocaine: NOT DETECTED
Opiates: NOT DETECTED
Tetrahydrocannabinol: NOT DETECTED

## 2021-07-14 LAB — URINALYSIS, ROUTINE W REFLEX MICROSCOPIC
Bilirubin Urine: NEGATIVE
Glucose, UA: NEGATIVE mg/dL
Ketones, ur: NEGATIVE mg/dL
Leukocytes,Ua: NEGATIVE
Nitrite: NEGATIVE
Protein, ur: 30 mg/dL — AB
Specific Gravity, Urine: 1.01 (ref 1.005–1.030)
pH: 5 (ref 5.0–8.0)

## 2021-07-14 LAB — TROPONIN I (HIGH SENSITIVITY): Troponin I (High Sensitivity): 11 ng/L (ref <18)

## 2021-07-14 MED ORDER — ONDANSETRON HCL 4 MG/2ML IJ SOLN
4.0000 mg | Freq: Once | INTRAMUSCULAR | Status: AC
  Administered 2021-07-14: 4 mg via INTRAVENOUS
  Filled 2021-07-14: qty 2

## 2021-07-14 NOTE — ED Notes (Signed)
Called pt, no response. 

## 2021-07-14 NOTE — ED Notes (Signed)
Called patient for vitals, no response.  

## 2021-09-21 IMAGING — US IR ABDOMEN US LIMITED
1 series · 4 of 4 positions shown · non-contrast
Comparison: 11/17/2020

CLINICAL DATA: Cirrhosis and history of ascites requiring prior
paracentesis, most recently on 11/12/2020.

EXAM:
LIMITED ABDOMEN ULTRASOUND FOR ASCITES
TECHNIQUE: Limited ultrasound survey for ascites was performed in all four
abdominal quadrants.

[Series 1: ir (id) (id)/(id)/(id) ir · 4 of 4 slices shown]
[im 1/4]
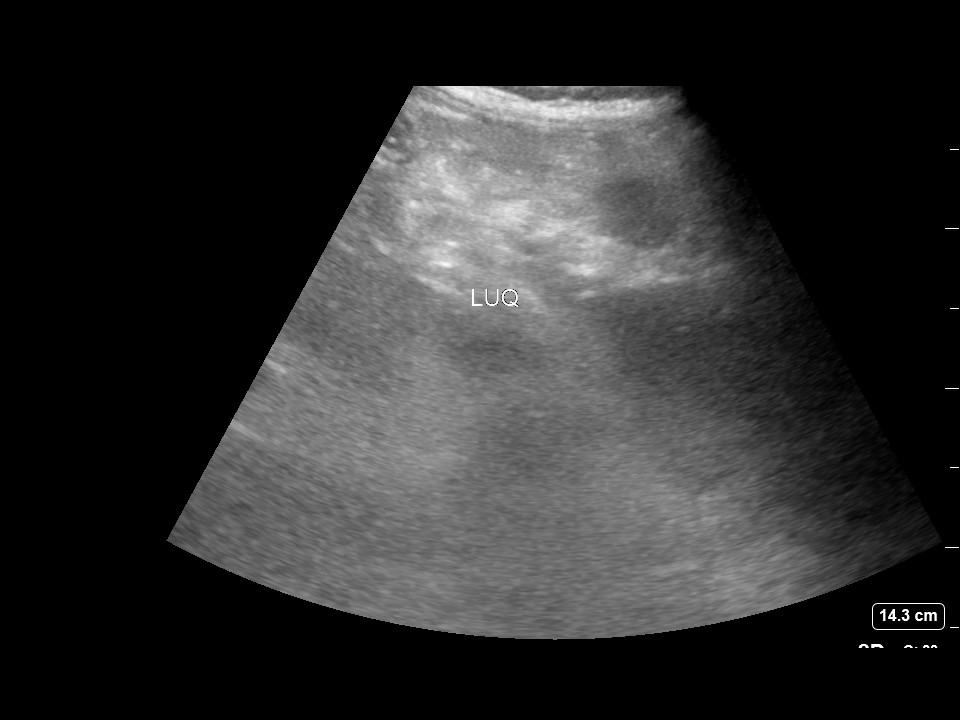
[im 2/4]
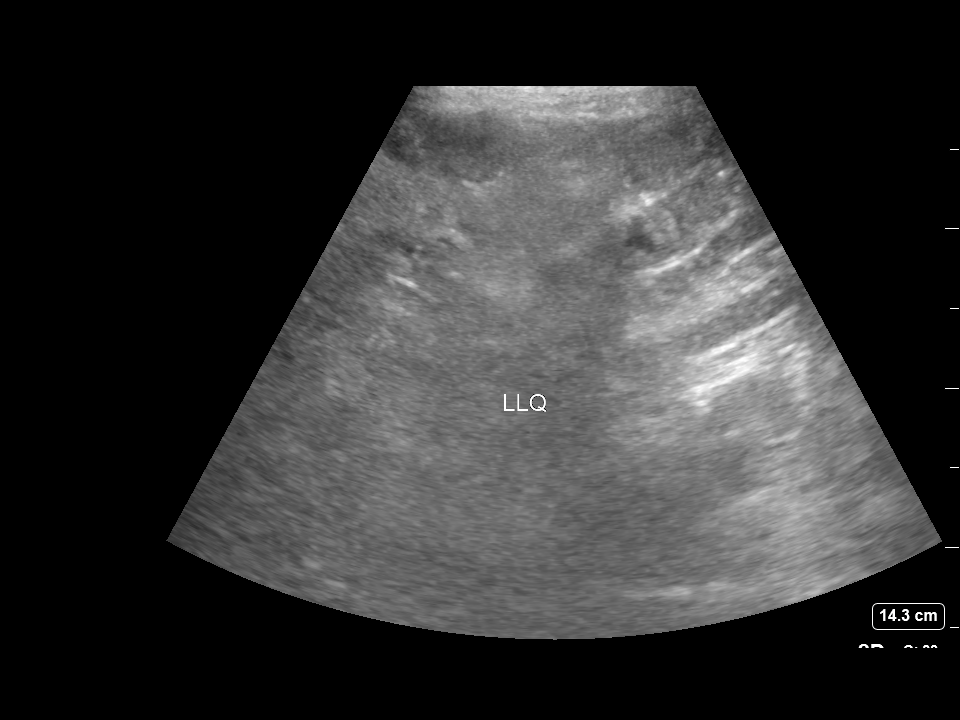
[im 3/4]
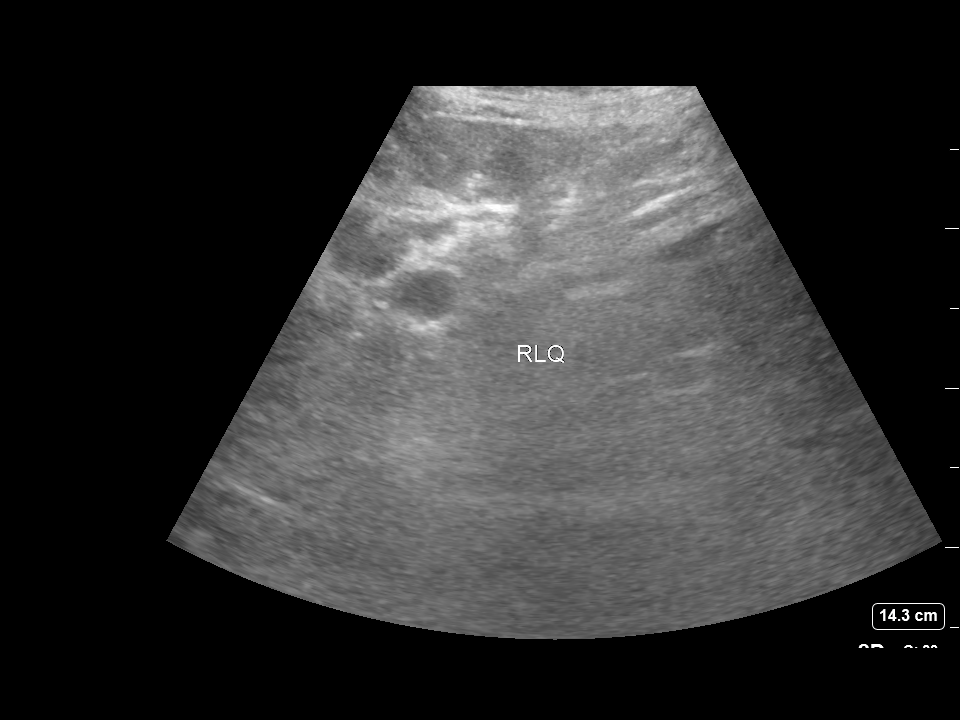
[im 4/4]
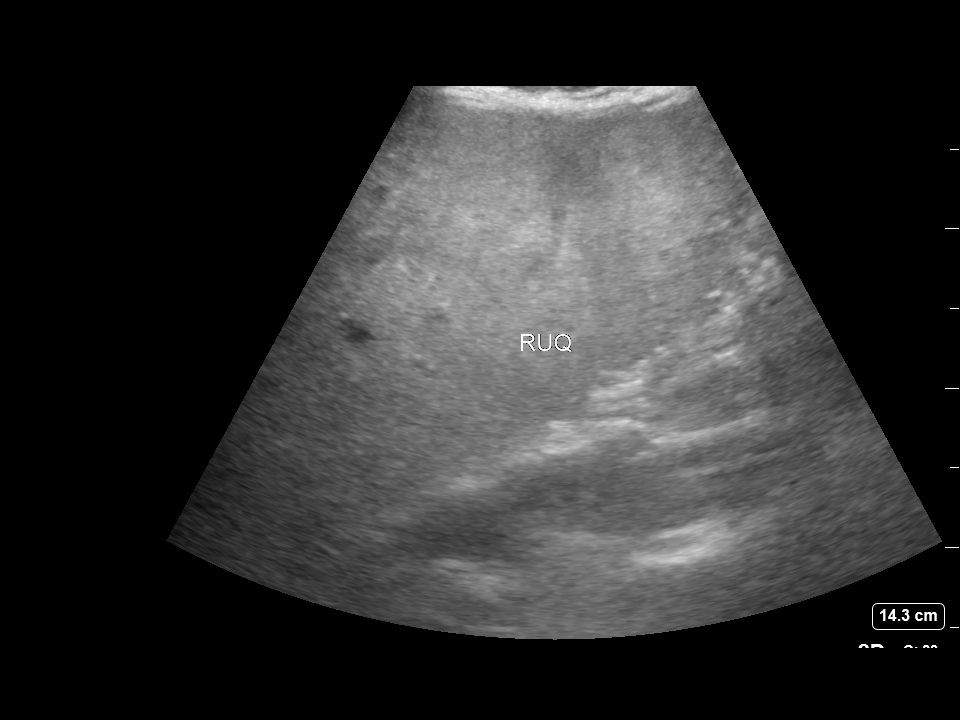

[4 of 4 positions shown; findings below may reference images not displayed]

FINDINGS: Sonographic survey demonstrates no visualized ascites in the
peritoneal cavity.
IMPRESSION: No ascites identified.

## 2022-05-03 IMAGING — DX DG HUMERUS 2V *L*
2 series · 2 of 2 positions shown · non-contrast
Comparison: None.

CLINICAL DATA: Syncope, fell

EXAM:
LEFT HUMERUS - 2+ VIEW

[humerus ap]
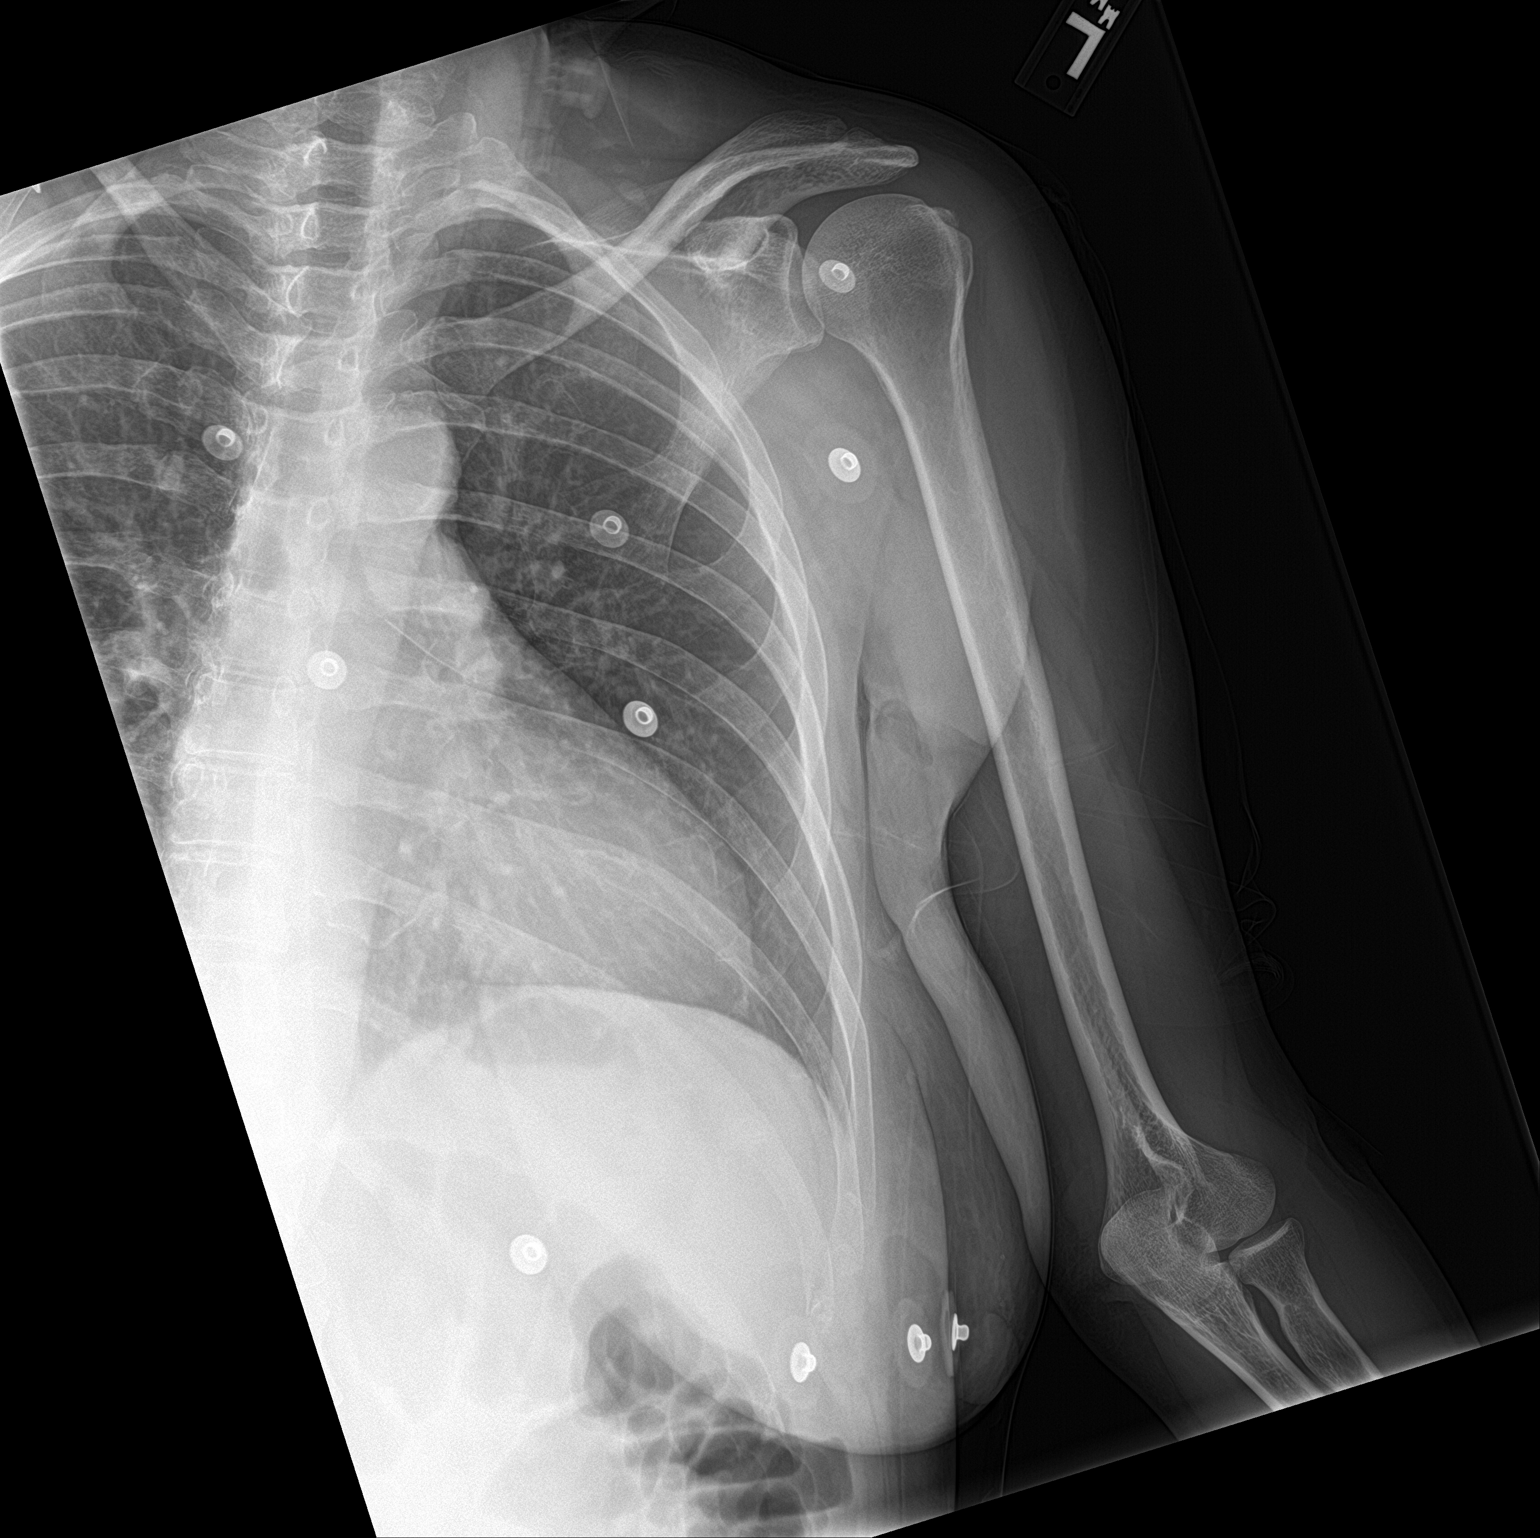

[humerus lat]
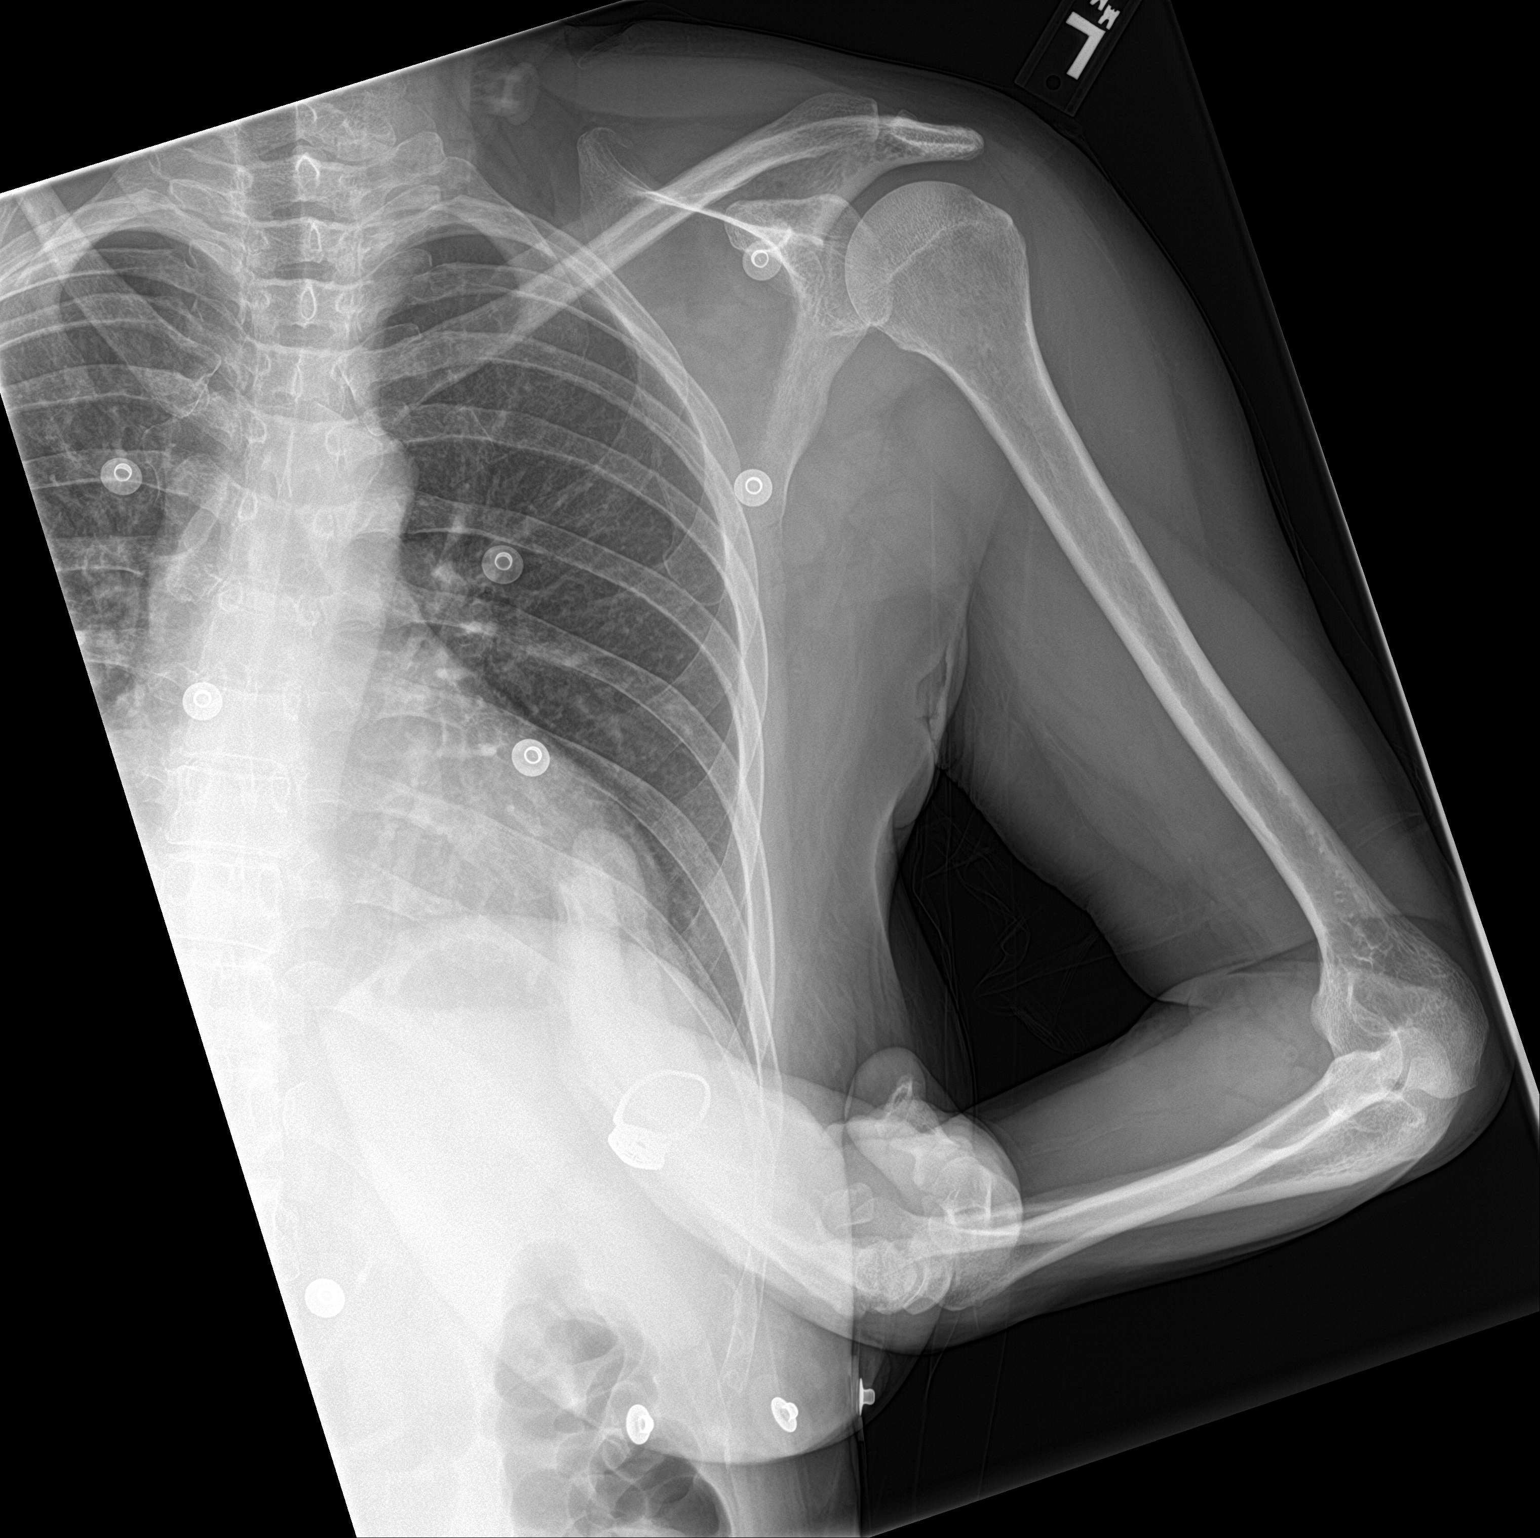

[2 of 2 positions shown; findings below may reference images not displayed]

FINDINGS: Frontal and lateral views of the left humerus are obtained. No acute
fractures. Alignment appears anatomic. The visualized portions of
the left chest are clear.
IMPRESSION: 1. Unremarkable left humerus.

## 2022-05-03 IMAGING — CT CT CERVICAL SPINE W/O CM
3 of 4 series · 12 of 33 positions shown, 14 images · non-contrast
Comparison: None.

CLINICAL DATA: Neck trauma, midline tenderness (Age 16-64y)

EXAM:
CT HEAD WITHOUT CONTRAST
CT CERVICAL SPINE WITHOUT CONTRAST
TECHNIQUE: Multidetector CT imaging of the head and cervical spine was
performed following the standard protocol without intravenous
contrast. Multiplanar CT image reconstructions of the cervical spine
were also generated.

[Series 4: c_spine 2.0 st · axial · 0.31mm/px · z∈[-356,-228]mm · 4 of 97 slices shown, 5 images]
[im 17/97  soft-tissue]
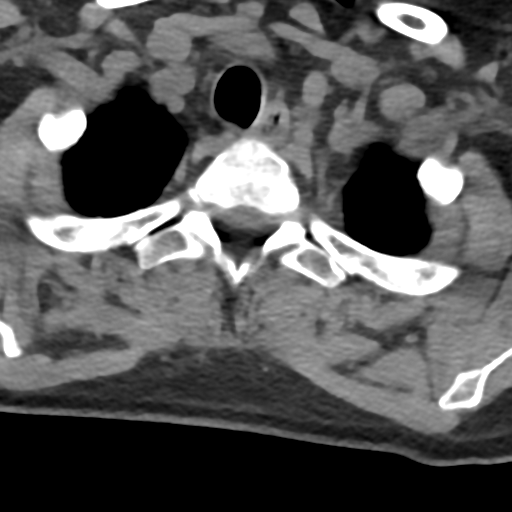
[im 17/97  bone]
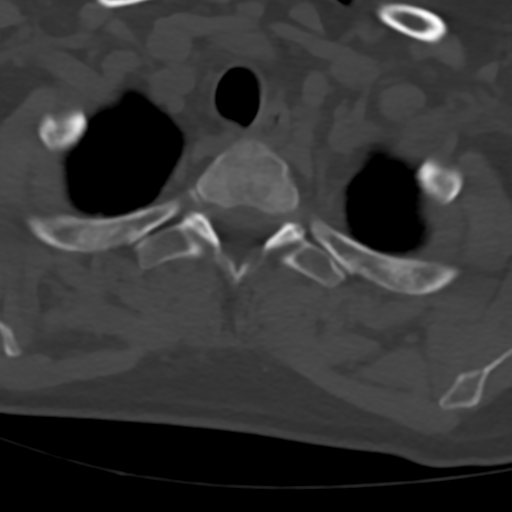
[im 33/97  bone]
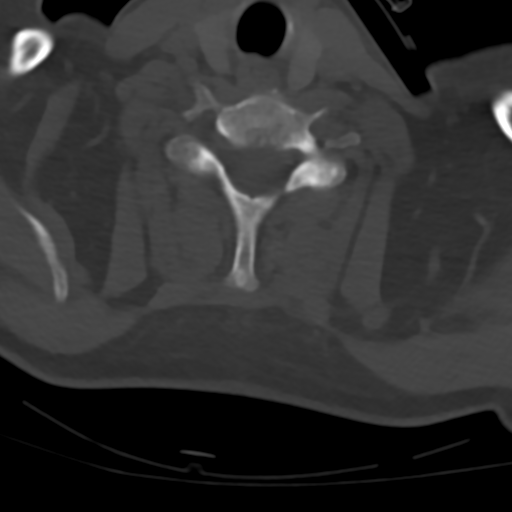
[im 65/97  bone]
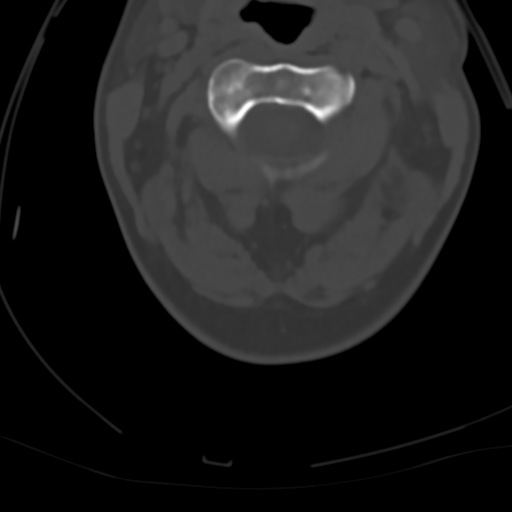
[im 81/97  bone]
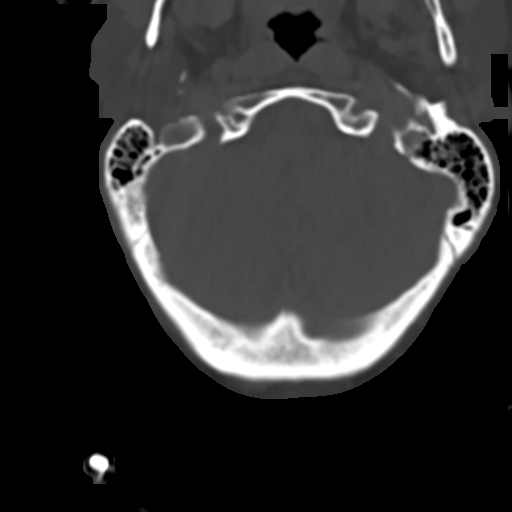

[Series 6: c_spine 2.0 sag bone · sagittal · 0.28mm/px · 5 of 61 slices shown, 6 images]
[im 21/61  bone]
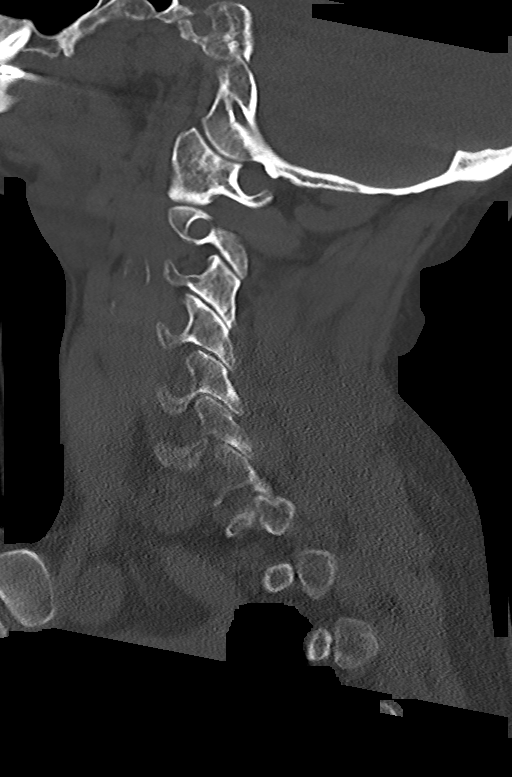
[im 26/61  bone]
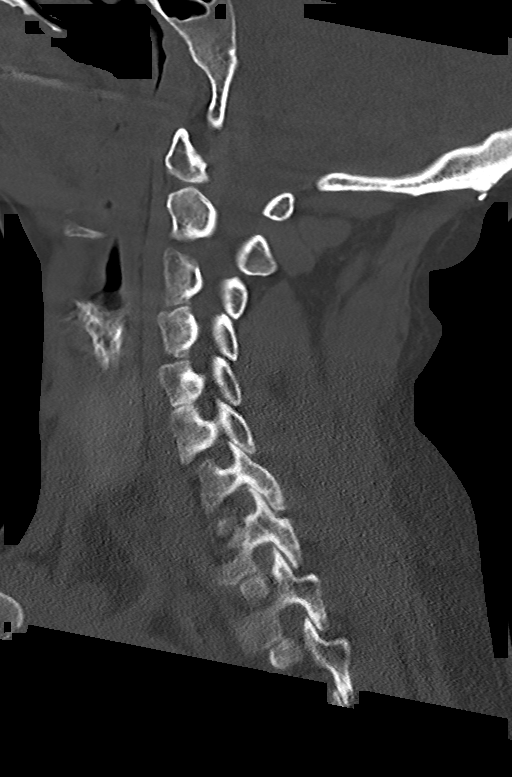
[im 31/61  soft-tissue]
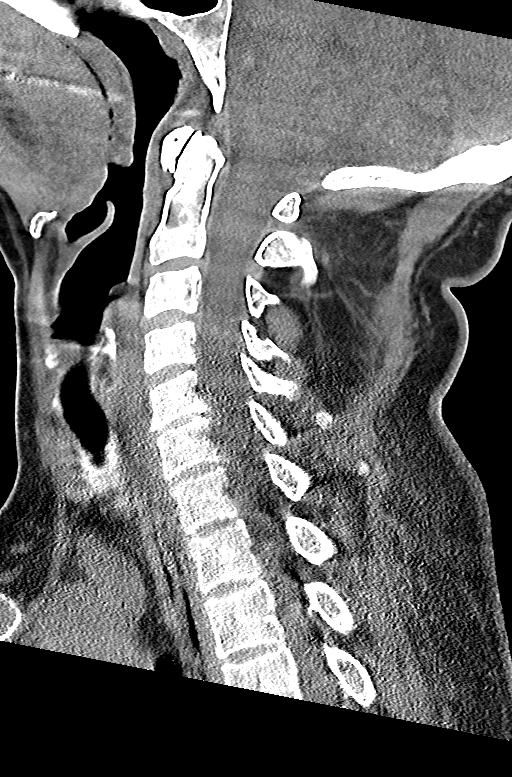
[im 31/61  bone]
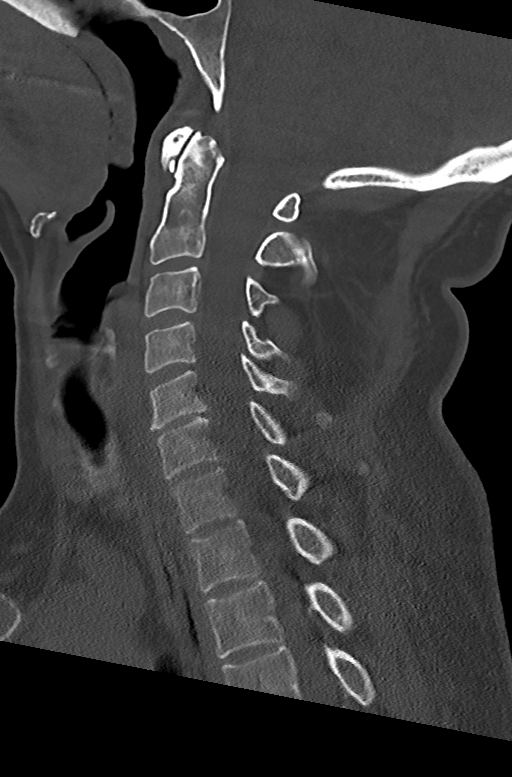
[im 36/61  bone]
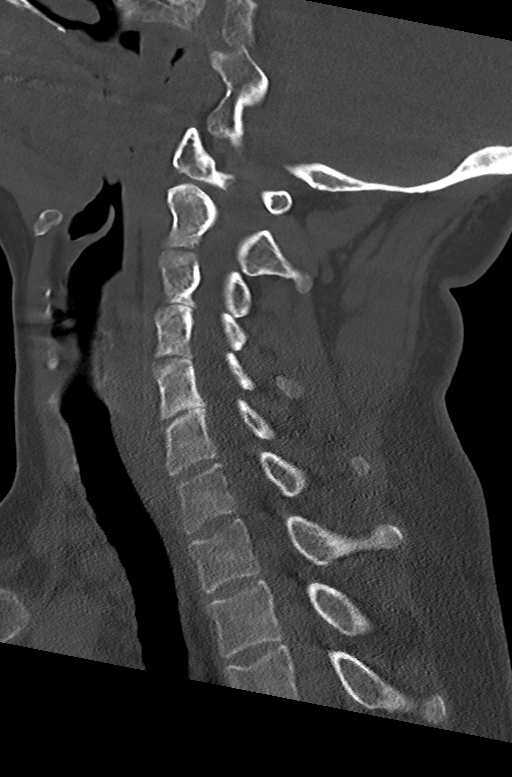
[im 41/61  bone]
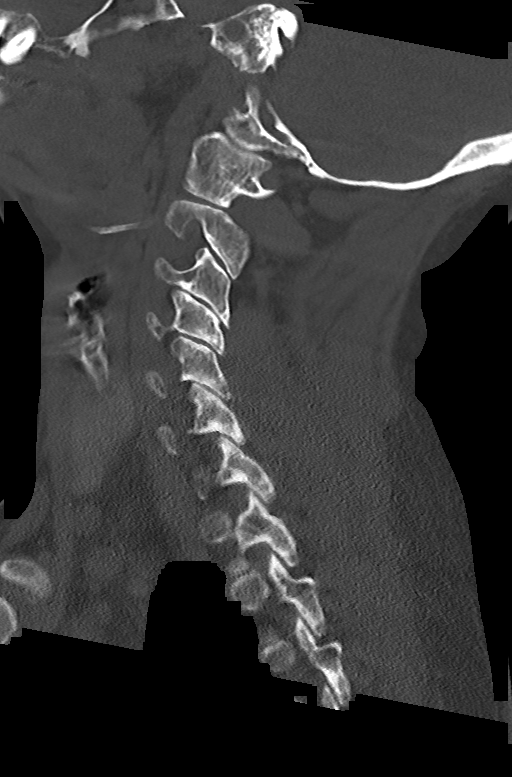

[Series 7: c_spine 2.0 cor bone · coronal · 0.28mm/px · 3 of 61 slices shown]
[im 13/61  bone]
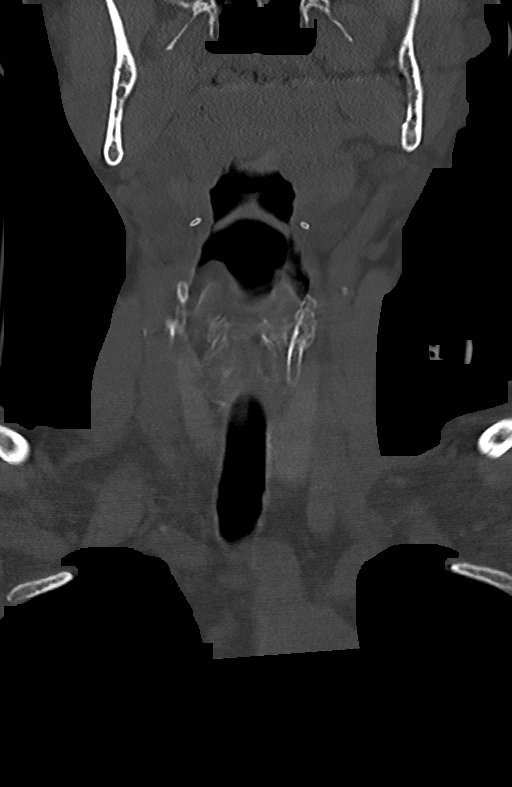
[im 25/61  bone]
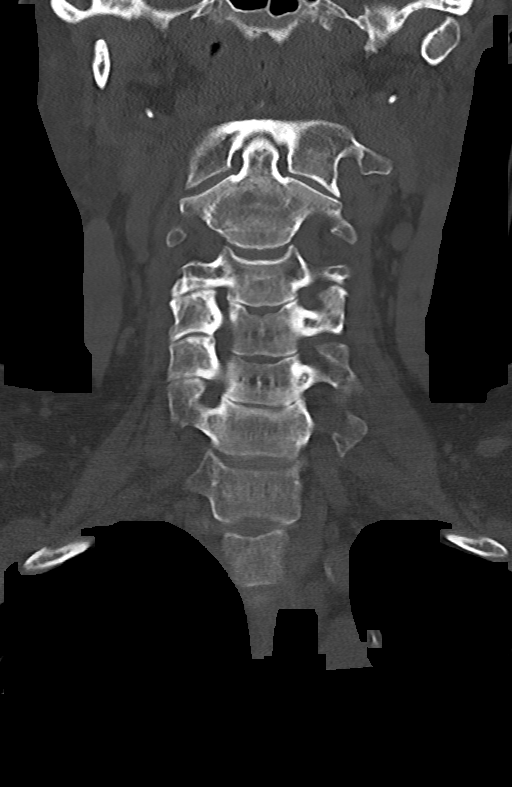
[im 37/61  bone]
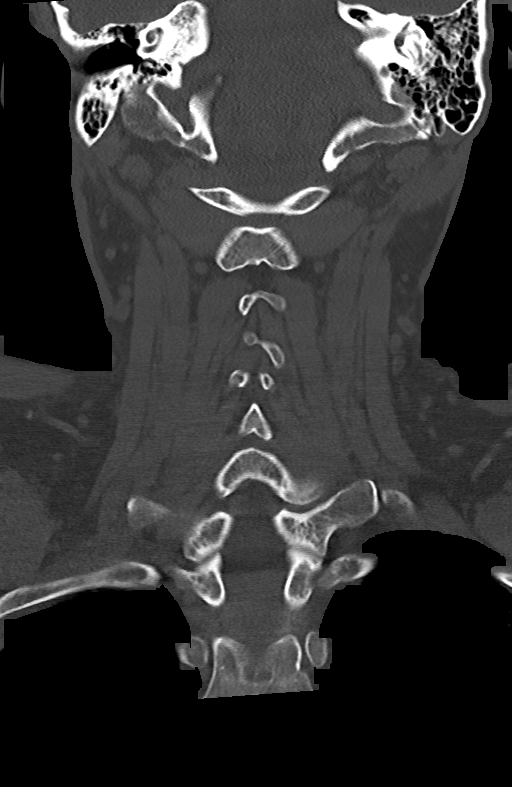

[12 of 33 positions shown; findings below may reference images not displayed]

FINDINGS: CT HEAD FINDINGS

Brain:

No evidence of large-territorial acute infarction. No parenchymal
hemorrhage. No mass lesion. No extra-axial collection.

No mass effect or midline shift. No hydrocephalus. Basilar cisterns
are patent.

Vascular: No hyperdense vessel.

Skull: No acute fracture or focal lesion.

Sinuses/Orbits: Right maxillary sinus mucosal thickening. Otherwise
the remaining visualized paranasal sinuses and mastoid air cells are
clear. The orbits are unremarkable.

Other: None.

CT CERVICAL SPINE FINDINGS

Alignment: Normal.

Skull base and vertebrae: No acute fracture. No aggressive appearing
focal osseous lesion or focal pathologic process.

Soft tissues and spinal canal: No prevertebral fluid or swelling. No
visible canal hematoma.

Upper chest: Unremarkable.

Other: None.
IMPRESSION: 1. No acute intracranial abnormality.
2. No acute displaced fracture or traumatic listhesis of the
cervical spine.

## 2022-07-24 ENCOUNTER — Emergency Department (HOSPITAL_COMMUNITY)
Admission: EM | Admit: 2022-07-24 | Discharge: 2022-07-25 | Disposition: A | Discharge status: Home / Self Care | Payer: BC Managed Care – PPO | Attending: Emergency Medicine | Admitting: Emergency Medicine

## 2022-07-24 ENCOUNTER — Emergency Department (HOSPITAL_COMMUNITY): Payer: BC Managed Care – PPO

## 2022-07-24 ENCOUNTER — Other Ambulatory Visit: Payer: Self-pay

## 2022-07-24 ENCOUNTER — Encounter (HOSPITAL_COMMUNITY): Payer: Self-pay | Admitting: Emergency Medicine

## 2022-07-24 DIAGNOSIS — E7229 Other disorders of urea cycle metabolism: Secondary | ICD-10-CM | POA: Insufficient documentation

## 2022-07-24 DIAGNOSIS — Z20822 Contact with and (suspected) exposure to covid-19: Secondary | ICD-10-CM | POA: Diagnosis not present

## 2022-07-24 DIAGNOSIS — R197 Diarrhea, unspecified: Secondary | ICD-10-CM | POA: Diagnosis not present

## 2022-07-24 DIAGNOSIS — R1110 Vomiting, unspecified: Secondary | ICD-10-CM | POA: Diagnosis present

## 2022-07-24 DIAGNOSIS — R55 Syncope and collapse: Secondary | ICD-10-CM | POA: Diagnosis not present

## 2022-07-24 DIAGNOSIS — R7989 Other specified abnormal findings of blood chemistry: Secondary | ICD-10-CM

## 2022-07-24 DIAGNOSIS — Z7901 Long term (current) use of anticoagulants: Secondary | ICD-10-CM | POA: Insufficient documentation

## 2022-07-24 DIAGNOSIS — E119 Type 2 diabetes mellitus without complications: Secondary | ICD-10-CM | POA: Diagnosis not present

## 2022-07-24 DIAGNOSIS — R791 Abnormal coagulation profile: Secondary | ICD-10-CM | POA: Diagnosis not present

## 2022-07-24 LAB — CBC WITH DIFFERENTIAL/PLATELET
Abs Immature Granulocytes: 0.04 10*3/uL (ref 0.00–0.07)
Basophils Absolute: 0.1 10*3/uL (ref 0.0–0.1)
Basophils Relative: 1 %
Eosinophils Absolute: 0.1 10*3/uL (ref 0.0–0.5)
Eosinophils Relative: 1 %
HCT: 46.2 % — ABNORMAL HIGH (ref 36.0–46.0)
Hemoglobin: 16.3 g/dL — ABNORMAL HIGH (ref 12.0–15.0)
Immature Granulocytes: 0 %
Lymphocytes Relative: 41 %
Lymphs Abs: 3.9 10*3/uL (ref 0.7–4.0)
MCH: 32.8 pg (ref 26.0–34.0)
MCHC: 35.3 g/dL (ref 30.0–36.0)
MCV: 93 fL (ref 80.0–100.0)
Monocytes Absolute: 0.5 10*3/uL (ref 0.1–1.0)
Monocytes Relative: 5 %
Neutro Abs: 4.9 10*3/uL (ref 1.7–7.7)
Neutrophils Relative %: 52 %
Platelets: 157 10*3/uL (ref 150–400)
RBC: 4.97 MIL/uL (ref 3.87–5.11)
RDW: 13.3 % (ref 11.5–15.5)
WBC: 9.5 10*3/uL (ref 4.0–10.5)
nRBC: 0 % (ref 0.0–0.2)

## 2022-07-24 NOTE — ED Triage Notes (Signed)
Patient BIB [facility] c/o being "sick" for the past few days and having a syncopal episode while vomiting this evening.  No chest pain, no shob.  12 lead unremarkable.  CBG 400. 200 cc of NaCl 20 g RAC--->288 CBG 97% 2 L [place] 138/100 62 HR

## 2022-07-24 NOTE — ED Provider Triage Note (Signed)
Emergency Medicine Provider Triage Evaluation Note  Shelby Conner , a 51 y.o. female  was evaluated in triage.  Pt complains of abdominal pain.  She has been vomiting and having diarrhea for 3 days now.   She has vomited about 5 times today and had diarrhea once.   She was vomiting and passed out today prompting ems to get called.   Patient doesn't think she hit her head.  She is on warfarin and it wasn't witnessed.   With patient's permission I called her husband and spoke with him.  He did not see her pass out but notes that shortly before hand she was kneeling in front of the toilet to vomit so if she did strike her head it was only from kneeling height.  Physical Exam  BP (!) 140/95 (BP Location: Left Arm)   Pulse 82   Temp 98.1 F (36.7 C) (Oral)   Resp 18   Ht 5\' 4"  (1.626 m)   Wt 72.6 kg   SpO2 100%   BMI 27.46 kg/m  Gen:   Awake, no distress   Resp:  Normal effort  MSK:   Moves extremities without difficulty  Other:  Vomiting   Medical Decision Making  Medically screening exam initiated at 11:13 PM.  Appropriate orders placed.  Shelby Conner was informed that the remainder of the evaluation will be completed by another provider, this initial triage assessment does not replace that evaluation, and the importance of remaining in the ED until their evaluation is complete.  I discussed CT head with patient. We discussed that she is anticoagulated with warfarin, and it is not entirely clear if she hit her head or not. Through shared decision making patient wishes for CT on her head.  2326: I called CT to expedite her head scan.     2327, Shelby Conner 07/24/22 2328

## 2022-07-24 NOTE — ED Notes (Signed)
Pt is leaving due to wait time 

## 2022-07-25 LAB — COMPREHENSIVE METABOLIC PANEL
ALT: 21 U/L (ref 0–44)
AST: 31 U/L (ref 15–41)
Albumin: 3.9 g/dL (ref 3.5–5.0)
Alkaline Phosphatase: 126 U/L (ref 38–126)
Anion gap: 16 — ABNORMAL HIGH (ref 5–15)
BUN: 6 mg/dL (ref 6–20)
CO2: 20 mmol/L — ABNORMAL LOW (ref 22–32)
Calcium: 9.5 mg/dL (ref 8.9–10.3)
Chloride: 99 mmol/L (ref 98–111)
Creatinine, Ser: 0.6 mg/dL (ref 0.44–1.00)
GFR, Estimated: >60 mL/min (ref >60)
Glucose, Bld: 352 mg/dL — ABNORMAL HIGH (ref 70–99)
Potassium: 3.8 mmol/L (ref 3.5–5.1)
Sodium: 135 mmol/L (ref 135–145)
Total Bilirubin: 1.4 mg/dL — ABNORMAL HIGH (ref 0.3–1.2)
Total Protein: 8.5 g/dL — ABNORMAL HIGH (ref 6.5–8.1)

## 2022-07-25 LAB — URINALYSIS, ROUTINE W REFLEX MICROSCOPIC
Bacteria, UA: NONE SEEN
Bilirubin Urine: NEGATIVE
Glucose, UA: >=500 mg/dL — AB
Ketones, ur: NEGATIVE mg/dL
Leukocytes,Ua: NEGATIVE
Nitrite: NEGATIVE
Protein, ur: NEGATIVE mg/dL
Specific Gravity, Urine: 1.008 (ref 1.005–1.030)
pH: 6 (ref 5.0–8.0)

## 2022-07-25 LAB — PROTIME-INR
INR: 1.6 — ABNORMAL HIGH (ref 0.8–1.2)
Prothrombin Time: 18.6 seconds — ABNORMAL HIGH (ref 11.4–15.2)

## 2022-07-25 LAB — RESP PANEL BY RT-PCR (FLU A&B, COVID) ARPGX2
Influenza A by PCR: NEGATIVE
Influenza B by PCR: NEGATIVE
SARS Coronavirus 2 by RT PCR: NEGATIVE

## 2022-07-25 LAB — TROPONIN I (HIGH SENSITIVITY)
Troponin I (High Sensitivity): 6 ng/L (ref <18)
Troponin I (High Sensitivity): 5 ng/L (ref <18)

## 2022-07-25 LAB — CK: Total CK: 194 U/L (ref 38–234)

## 2022-07-25 LAB — AMMONIA: Ammonia: 67 umol/L — ABNORMAL HIGH (ref 9–35)

## 2022-07-25 MED ORDER — SODIUM CHLORIDE 0.9 % IV BOLUS
1000.0000 mL | Freq: Once | INTRAVENOUS | Status: AC
  Administered 2022-07-25: 1000 mL via INTRAVENOUS

## 2022-07-25 MED ORDER — WARFARIN SODIUM 1 MG PO TABS: 1.0000 mg | ORAL_TABLET | Freq: Once | ORAL | Status: DC

## 2022-07-25 MED ORDER — ONDANSETRON 4 MG PO TBDP: 4.0000 mg | ORAL_TABLET | Freq: Three times a day (TID) | ORAL | 0 refills | Status: DC | PRN

## 2022-07-25 MED ORDER — ONDANSETRON HCL 4 MG/2ML IJ SOLN
4.0000 mg | Freq: Once | INTRAMUSCULAR | Status: AC
  Administered 2022-07-25: 4 mg via INTRAVENOUS
  Filled 2022-07-25: qty 2

## 2022-07-25 MED ORDER — LACTINEX PO CHEW: 1.0000 | CHEWABLE_TABLET | Freq: Three times a day (TID) | ORAL | 0 refills | Status: AC

## 2022-07-25 NOTE — Discharge Instructions (Addendum)
We recommend that you discuss your symptoms with your gastroenterologist.  It may be beneficial for you to restart your lactulose.  You have been started on Zofran which you can take for management of nausea.  Use daily probiotics to help regulate your bowel movements.  Return to the ED for new or concerning symptoms.

## 2022-07-25 NOTE — ED Notes (Signed)
Patient yelling at staff when other patients are brought back before her.  Triage process explained to patient multiple times.

## 2022-08-01 NOTE — ED Provider Notes (Signed)
[facility]-EMERGENCY DEPT Provider Note   CSN: [id number] Arrival date & time: 07/24/22  2246     History {Add pertinent medical, surgical, social history, OB history to HPI:1} Chief Complaint  Patient presents with   Loss of Consciousness    Shelby Conner is a 51 y.o. female.  The history is provided by the patient. No language interpreter was used.  Loss of Consciousness      Home Medications Prior to Admission medications   Medication Sig Start Date End Date Taking? Authorizing Provider  lactobacillus acidophilus & bulgar (LACTINEX) chewable tablet Chew 1 tablet by mouth 3 (three) times daily with meals. 07/25/22 08/24/22 Yes Antony Madura, PA-C  ondansetron (ZOFRAN-ODT) 4 MG disintegrating tablet Take 1 tablet (4 mg total) by mouth every 8 (eight) hours as needed for nausea or vomiting. 07/25/22  Yes Antony Madura, PA-C  ezetimibe (ZETIA) 10 MG tablet Take 10 mg by mouth daily. 11/19/20   [provider]  feeding supplement (ENSURE ENLIVE / ENSURE PLUS) LIQD Take 237 mLs by mouth 3 (three) times daily between meals. 11/17/20   Pahwani, Kasandra Knudsen, MD  mirtazapine (REMERON) 15 MG tablet Take 7.5 mg by mouth at bedtime. 11/24/20   [provider]  Multiple Vitamin (MULTIVITAMIN WITH MINERALS) TABS tablet Take 1 tablet by mouth daily. 11/18/20   Pahwani, Kasandra Knudsen, MD  pantoprazole (PROTONIX) 40 MG tablet Take 1 tablet (40 mg total) by mouth 2 (two) times daily. 11/17/20   Pahwani, Kasandra Knudsen, MD  warfarin (COUMADIN) 1 MG tablet Take 1 tablet (1 mg total) by mouth daily. 12/11/20 01/10/21  Lorin Glass, MD      Allergies    Patient has no known allergies.    Review of Systems   Review of Systems  Cardiovascular:  Positive for syncope.  Ten systems reviewed and are negative for acute change, except as noted in the HPI.    Physical Exam Updated Vital Signs BP 129/77   Pulse 85   Temp 97.7 F (36.5 C) (Oral)   Resp 20   Ht 5\' 4"   (1.626 m)   Wt 72.6 kg   SpO2 95%   BMI 27.46 kg/m   Physical Exam Vitals and nursing note reviewed.  Constitutional:      General: She is not in acute distress.    Appearance: She is well-developed. She is not diaphoretic.  HENT:     Head: Normocephalic and atraumatic.  Eyes:     General: No scleral icterus.    Conjunctiva/sclera: Conjunctivae normal.  Cardiovascular:     Rate and Rhythm: Normal rate and regular rhythm.     Pulses: Normal pulses.  Pulmonary:     Effort: Pulmonary effort is normal. No respiratory distress.     Breath sounds: No stridor. No wheezing.     Comments: Respirations even and unlabored Musculoskeletal:        General: Normal range of motion.     Cervical back: Normal range of motion.  Skin:    General: Skin is warm and dry.     Coloration: Skin is not pale.     Findings: No erythema or rash.  Neurological:     Mental Status: She is alert and oriented to person, place, and time.  Psychiatric:        Behavior: Behavior normal.     ED Results / Procedures / Treatments   Labs (all labs ordered are listed, but only abnormal results are displayed) Labs Reviewed  URINALYSIS, ROUTINE  W REFLEX MICROSCOPIC - Abnormal; Notable for the following components:      Result Value   Color, Urine STRAW (*)    Glucose, UA >=500 (*)    Hgb urine dipstick SMALL (*)    All other components within normal limits  COMPREHENSIVE METABOLIC PANEL - Abnormal; Notable for the following components:   CO2 20 (*)    Glucose, Bld 352 (*)    Total Protein 8.5 (*)    Total Bilirubin 1.4 (*)    Anion gap 16 (*)    All other components within normal limits  CBC WITH DIFFERENTIAL/PLATELET - Abnormal; Notable for the following components:   Hemoglobin 16.3 (*)    HCT 46.2 (*)    All other components within normal limits  AMMONIA - Abnormal; Notable for the following components:   Ammonia 67 (*)    All other components within normal limits  PROTIME-INR - Abnormal; Notable  for the following components:   Prothrombin Time 18.6 (*)    INR 1.6 (*)    All other components within normal limits  RESP PANEL BY RT-PCR (FLU A&B, COVID) ARPGX2  CK  TROPONIN I (HIGH SENSITIVITY)  TROPONIN I (HIGH SENSITIVITY)    EKG EKG Interpretation  Date/Time:  Monday July 24 2022 23:04:27 EDT Ventricular Rate:  83 PR Interval:  190 QRS Duration: 107 QT Interval:  409 QTC Calculation: 481 R Axis:   53 Text Interpretation: Sinus rhythm Confirmed by Nicanor Alcon, April ([id number]) on 07/25/2022 4:13:44 AM  Radiology No results found.  Procedures Procedures  {Document cardiac monitor, telemetry assessment procedure when appropriate:1}  Medications Ordered in ED Medications  ondansetron (ZOFRAN) injection 4 mg (4 mg Intravenous Given 07/25/22 0416)  sodium chloride 0.9 % bolus 1,000 mL (0 mLs Intravenous Stopped 07/25/22 0626)    ED Course/ Medical Decision Making/ A&P Clinical Course as of 08/01/22 2025  Tue Jul 25, 2022  0605 Patient tolerating PO w/o issue [KH]    Clinical Course User Index [KH] Antony Madura, PA-C                           Medical Decision Making Risk OTC drugs. Prescription drug management.   ***  {Document critical care time when appropriate:1} {Document review of labs and clinical decision tools ie heart score, Chads2Vasc2 etc:1}  {Document your independent review of radiology images, and any outside records:1} {Document your discussion with family members, caretakers, and with consultants:1} {Document social determinants of health affecting pt's care:1} {Document your decision making why or why not admission, treatments were needed:1} Final Clinical Impression(s) / ED Diagnoses Final diagnoses:  Vomiting and diarrhea  Increased ammonia level  Subtherapeutic international normalized ratio (INR)    Rx / DC Orders ED Discharge Orders          Ordered    ondansetron (ZOFRAN-ODT) 4 MG disintegrating tablet  Every 8 hours PRN        07/25/22  0625    lactobacillus acidophilus & bulgar (LACTINEX) chewable tablet  3 times daily with meals        07/25/22 0625

## 2023-01-27 ENCOUNTER — Other Ambulatory Visit: Payer: Self-pay

## 2023-01-27 ENCOUNTER — Inpatient Hospital Stay (HOSPITAL_COMMUNITY)
Admission: EM | Admit: 2023-01-27 | Discharge: 2023-02-08 | DRG: 270 | Disposition: A | Discharge status: Home / Self Care | Payer: BC Managed Care – PPO | Attending: Internal Medicine | Admitting: Internal Medicine

## 2023-01-27 ENCOUNTER — Emergency Department (HOSPITAL_COMMUNITY): Payer: BC Managed Care – PPO

## 2023-01-27 ENCOUNTER — Encounter (HOSPITAL_COMMUNITY): Payer: Self-pay | Admitting: Emergency Medicine

## 2023-01-27 DIAGNOSIS — T45516A Underdosing of anticoagulants, initial encounter: Secondary | ICD-10-CM | POA: Diagnosis present

## 2023-01-27 DIAGNOSIS — I82423 Acute embolism and thrombosis of iliac vein, bilateral: Secondary | ICD-10-CM | POA: Diagnosis present

## 2023-01-27 DIAGNOSIS — R161 Splenomegaly, not elsewhere classified: Secondary | ICD-10-CM | POA: Diagnosis present

## 2023-01-27 DIAGNOSIS — E1165 Type 2 diabetes mellitus with hyperglycemia: Secondary | ICD-10-CM | POA: Diagnosis present

## 2023-01-27 DIAGNOSIS — Z832 Family history of diseases of the blood and blood-forming organs and certain disorders involving the immune mechanism: Secondary | ICD-10-CM

## 2023-01-27 DIAGNOSIS — K760 Fatty (change of) liver, not elsewhere classified: Secondary | ICD-10-CM | POA: Diagnosis present

## 2023-01-27 DIAGNOSIS — R52 Pain, unspecified: Secondary | ICD-10-CM | POA: Diagnosis not present

## 2023-01-27 DIAGNOSIS — T82898A Other specified complication of vascular prosthetic devices, implants and grafts, initial encounter: Principal | ICD-10-CM | POA: Diagnosis present

## 2023-01-27 DIAGNOSIS — E785 Hyperlipidemia, unspecified: Secondary | ICD-10-CM | POA: Diagnosis present

## 2023-01-27 DIAGNOSIS — Z79899 Other long term (current) drug therapy: Secondary | ICD-10-CM

## 2023-01-27 DIAGNOSIS — D6859 Other primary thrombophilia: Secondary | ICD-10-CM | POA: Diagnosis not present

## 2023-01-27 DIAGNOSIS — E876 Hypokalemia: Secondary | ICD-10-CM | POA: Diagnosis not present

## 2023-01-27 DIAGNOSIS — D62 Acute posthemorrhagic anemia: Secondary | ICD-10-CM | POA: Diagnosis not present

## 2023-01-27 DIAGNOSIS — I82409 Acute embolism and thrombosis of unspecified deep veins of unspecified lower extremity: Secondary | ICD-10-CM | POA: Diagnosis not present

## 2023-01-27 DIAGNOSIS — M542 Cervicalgia: Secondary | ICD-10-CM | POA: Diagnosis not present

## 2023-01-27 DIAGNOSIS — Z87891 Personal history of nicotine dependence: Secondary | ICD-10-CM

## 2023-01-27 DIAGNOSIS — Y831 Surgical operation with implant of artificial internal device as the cause of abnormal reaction of the patient, or of later complication, without mention of misadventure at the time of the procedure: Secondary | ICD-10-CM | POA: Diagnosis present

## 2023-01-27 DIAGNOSIS — R109 Unspecified abdominal pain: Secondary | ICD-10-CM | POA: Diagnosis not present

## 2023-01-27 DIAGNOSIS — Z7901 Long term (current) use of anticoagulants: Secondary | ICD-10-CM

## 2023-01-27 DIAGNOSIS — Z8719 Personal history of other diseases of the digestive system: Secondary | ICD-10-CM | POA: Diagnosis not present

## 2023-01-27 DIAGNOSIS — Z86718 Personal history of other venous thrombosis and embolism: Secondary | ICD-10-CM

## 2023-01-27 DIAGNOSIS — I70221 Atherosclerosis of native arteries of extremities with rest pain, right leg: Secondary | ICD-10-CM | POA: Diagnosis present

## 2023-01-27 DIAGNOSIS — E871 Hypo-osmolality and hyponatremia: Secondary | ICD-10-CM | POA: Diagnosis not present

## 2023-01-27 DIAGNOSIS — Y638 Failure in dosage during other surgical and medical care: Secondary | ICD-10-CM | POA: Diagnosis present

## 2023-01-27 DIAGNOSIS — Z8249 Family history of ischemic heart disease and other diseases of the circulatory system: Secondary | ICD-10-CM

## 2023-01-27 DIAGNOSIS — Z825 Family history of asthma and other chronic lower respiratory diseases: Secondary | ICD-10-CM

## 2023-01-27 DIAGNOSIS — M7989 Other specified soft tissue disorders: Secondary | ICD-10-CM | POA: Diagnosis not present

## 2023-01-27 DIAGNOSIS — K683 Retroperitoneal hematoma: Secondary | ICD-10-CM | POA: Diagnosis not present

## 2023-01-27 DIAGNOSIS — I82491 Acute embolism and thrombosis of other specified deep vein of right lower extremity: Secondary | ICD-10-CM

## 2023-01-27 DIAGNOSIS — K7031 Alcoholic cirrhosis of liver with ascites: Secondary | ICD-10-CM | POA: Diagnosis present

## 2023-01-27 DIAGNOSIS — Z86711 Personal history of pulmonary embolism: Secondary | ICD-10-CM

## 2023-01-27 DIAGNOSIS — F419 Anxiety disorder, unspecified: Secondary | ICD-10-CM | POA: Diagnosis present

## 2023-01-27 DIAGNOSIS — D6959 Other secondary thrombocytopenia: Secondary | ICD-10-CM | POA: Diagnosis present

## 2023-01-27 DIAGNOSIS — Z833 Family history of diabetes mellitus: Secondary | ICD-10-CM

## 2023-01-27 DIAGNOSIS — I82220 Acute embolism and thrombosis of inferior vena cava: Secondary | ICD-10-CM | POA: Diagnosis present

## 2023-01-27 DIAGNOSIS — Z95828 Presence of other vascular implants and grafts: Secondary | ICD-10-CM

## 2023-01-27 DIAGNOSIS — E119 Type 2 diabetes mellitus without complications: Secondary | ICD-10-CM

## 2023-01-27 DIAGNOSIS — E1151 Type 2 diabetes mellitus with diabetic peripheral angiopathy without gangrene: Secondary | ICD-10-CM | POA: Diagnosis present

## 2023-01-27 DIAGNOSIS — I82411 Acute embolism and thrombosis of right femoral vein: Principal | ICD-10-CM | POA: Diagnosis present

## 2023-01-27 DIAGNOSIS — I8510 Secondary esophageal varices without bleeding: Secondary | ICD-10-CM | POA: Diagnosis present

## 2023-01-27 DIAGNOSIS — R630 Anorexia: Secondary | ICD-10-CM | POA: Diagnosis not present

## 2023-01-27 DIAGNOSIS — K766 Portal hypertension: Secondary | ICD-10-CM | POA: Diagnosis present

## 2023-01-27 LAB — APTT: aPTT: 28 seconds (ref 24–36)

## 2023-01-27 LAB — CBC WITH DIFFERENTIAL/PLATELET
Abs Immature Granulocytes: 0.06 10*3/uL (ref 0.00–0.07)
Basophils Absolute: 0.1 10*3/uL (ref 0.0–0.1)
Basophils Relative: 1 %
Eosinophils Absolute: 0.2 10*3/uL (ref 0.0–0.5)
Eosinophils Relative: 2 %
HCT: 39.4 % (ref 36.0–46.0)
Hemoglobin: 14 g/dL (ref 12.0–15.0)
Immature Granulocytes: 1 %
Lymphocytes Relative: 25 %
Lymphs Abs: 2.6 10*3/uL (ref 0.7–4.0)
MCH: 35.9 pg — ABNORMAL HIGH (ref 26.0–34.0)
MCHC: 35.5 g/dL (ref 30.0–36.0)
MCV: 101 fL — ABNORMAL HIGH (ref 80.0–100.0)
Monocytes Absolute: 0.6 10*3/uL (ref 0.1–1.0)
Monocytes Relative: 6 %
Neutro Abs: 6.6 10*3/uL (ref 1.7–7.7)
Neutrophils Relative %: 65 %
Platelets: UNDETERMINED 10*3/uL (ref 150–400)
RBC: 3.9 MIL/uL (ref 3.87–5.11)
RDW: 12.5 % (ref 11.5–15.5)
WBC: 10.2 10*3/uL (ref 4.0–10.5)
nRBC: 0 % (ref 0.0–0.2)

## 2023-01-27 LAB — I-STAT BETA HCG BLOOD, ED (MC, WL, AP ONLY): I-stat hCG, quantitative: <5 m[IU]/mL (ref <5)

## 2023-01-27 LAB — BASIC METABOLIC PANEL
Anion gap: 12 (ref 5–15)
BUN: 12 mg/dL (ref 6–20)
CO2: 24 mmol/L (ref 22–32)
Calcium: 9.5 mg/dL (ref 8.9–10.3)
Chloride: 91 mmol/L — ABNORMAL LOW (ref 98–111)
Creatinine, Ser: 0.8 mg/dL (ref 0.44–1.00)
GFR, Estimated: >60 mL/min (ref >60)
Glucose, Bld: 374 mg/dL — ABNORMAL HIGH (ref 70–99)
Potassium: 4.9 mmol/L (ref 3.5–5.1)
Sodium: 127 mmol/L — ABNORMAL LOW (ref 135–145)

## 2023-01-27 LAB — PROTIME-INR
INR: 1.4 — ABNORMAL HIGH (ref 0.8–1.2)
Prothrombin Time: 17.3 seconds — ABNORMAL HIGH (ref 11.4–15.2)

## 2023-01-27 MED ORDER — HEPARIN BOLUS VIA INFUSION
5000.0000 [IU] | Freq: Once | INTRAVENOUS | Status: AC
  Administered 2023-01-27: 5000 [IU] via INTRAVENOUS
  Filled 2023-01-27: qty 5000

## 2023-01-27 MED ORDER — MORPHINE SULFATE (PF) 4 MG/ML IV SOLN
4.0000 mg | Freq: Once | INTRAVENOUS | Status: AC
  Administered 2023-01-27: 4 mg via INTRAVENOUS
  Filled 2023-01-27: qty 1

## 2023-01-27 MED ORDER — HEPARIN (PORCINE) 25000 UT/250ML-% IV SOLN
1500.0000 [IU]/h | INTRAVENOUS | Status: DC
  Administered 2023-01-27: 1250 [IU]/h via INTRAVENOUS
  Filled 2023-01-27 (×3): qty 250

## 2023-01-27 MED ORDER — HYDROCODONE-ACETAMINOPHEN 5-325 MG PO TABS
1.0000 | ORAL_TABLET | ORAL | Status: DC | PRN
  Administered 2023-01-27 – 2023-01-29 (×10): 2 via ORAL
  Administered 2023-01-30: 1 via ORAL
  Administered 2023-01-31 – 2023-02-08 (×25): 2 via ORAL
  Filled 2023-01-27 (×37): qty 2

## 2023-01-27 MED ORDER — ONDANSETRON HCL 4 MG/2ML IJ SOLN
4.0000 mg | Freq: Once | INTRAMUSCULAR | Status: AC
  Administered 2023-01-27: 4 mg via INTRAVENOUS
  Filled 2023-01-27: qty 2

## 2023-01-27 MED ORDER — MORPHINE SULFATE (PF) 4 MG/ML IV SOLN
6.0000 mg | Freq: Once | INTRAVENOUS | Status: AC
  Administered 2023-01-27: 6 mg via INTRAVENOUS
  Filled 2023-01-27: qty 2

## 2023-01-27 MED ORDER — SODIUM CHLORIDE 0.9 % IV SOLN: INTRAVENOUS | Status: AC

## 2023-01-27 MED ORDER — ACETAMINOPHEN 650 MG RE SUPP: 650.0000 mg | Freq: Four times a day (QID) | RECTAL | Status: DC | PRN

## 2023-01-27 MED ORDER — INSULIN ASPART 100 UNIT/ML IJ SOLN
0.0000 [IU] | INTRAMUSCULAR | Status: DC
  Administered 2023-01-28: 7 [IU] via SUBCUTANEOUS
  Administered 2023-01-28: 9 [IU] via SUBCUTANEOUS

## 2023-01-27 MED ORDER — IOHEXOL 350 MG/ML SOLN
75.0000 mL | Freq: Once | INTRAVENOUS | Status: AC | PRN
  Administered 2023-01-27: 75 mL via INTRAVENOUS

## 2023-01-27 MED ORDER — ACETAMINOPHEN 325 MG PO TABS
650.0000 mg | ORAL_TABLET | Freq: Four times a day (QID) | ORAL | Status: DC | PRN
  Administered 2023-01-27 – 2023-02-06 (×4): 650 mg via ORAL
  Filled 2023-01-27 (×4): qty 2

## 2023-01-27 MED ORDER — PANTOPRAZOLE SODIUM 40 MG PO TBEC: 40.0000 mg | DELAYED_RELEASE_TABLET | Freq: Two times a day (BID) | ORAL | Status: DC

## 2023-01-27 NOTE — ED Triage Notes (Addendum)
Pt reports right thigh pain with leg swelling. Pt reports she has clotting disorder and stopped her coumadin for 5 days for a gum surgery. Dorsalis pedis pulse obtained via doppler.

## 2023-01-27 NOTE — ED Provider Notes (Signed)
[place] EMERGENCY DEPARTMENT AT [facility] Provider Note   CSN: [id number] Arrival date & time: 01/27/23  1409     History  Chief Complaint  Patient presents with   Leg Pain    Shelby Conner is a 52 y.o. female.  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  Says that she had a dental procedure recently and had to pause her Coumadin for 3 days and resumed it yesterday so she thinks she is subtherapeutic.  Says that on Thursday she started having calf pain.  Says that today her entire leg was significantly swollen and painful so she came into the emergency department for evaluation.  Denies any chest pain or shortness of breath at this time.  Denies any numbness or weakness of her leg.       Home Medications Prior to Admission medications   Medication Sig Start Date End Date Taking? Authorizing Provider  ibuprofen (ADVIL) 200 MG tablet Take 200-400 mg by mouth 2 (two) times daily as needed for headache, mild pain or moderate pain.   Yes [provider]  potassium chloride (KLOR-CON) 10 MEQ tablet Take 10 mEq by mouth daily.   Yes [provider]  Tetrahydrozoline HCl (VISINE OP) Place 1 drop into both eyes 2 (two) times daily as needed (dry eyes, itchy eyes).   Yes [provider]  warfarin (COUMADIN) 6 MG tablet Take 6 mg by mouth daily.   Yes [provider]  feeding supplement (ENSURE ENLIVE / ENSURE PLUS) LIQD Take 237 mLs by mouth 3 (three) times daily between meals. Patient not taking: Reported on 01/27/2023 11/17/20   Ollen Bowl, MD  Multiple Vitamin (MULTIVITAMIN WITH MINERALS) TABS tablet Take 1 tablet by mouth daily. Patient not taking: Reported on 01/27/2023 11/18/20   Pahwani, Kasandra Knudsen, MD  ondansetron (ZOFRAN-ODT) 4 MG disintegrating tablet  Take 1 tablet (4 mg total) by mouth every 8 (eight) hours as needed for nausea or vomiting. Patient not taking: Reported on 01/27/2023 07/25/22   Antony Madura, PA-C  pantoprazole (PROTONIX) 40 MG tablet Take 1 tablet (40 mg total) by mouth 2 (two) times daily. Patient not taking: Reported on 01/27/2023 11/17/20   Ollen Bowl, MD  warfarin (COUMADIN) 1 MG tablet Take 1 tablet (1 mg total) by mouth daily. Patient not taking: Reported on 01/27/2023 12/11/20 01/10/21  Lorin Glass, MD      Allergies    Patient has no known allergies.    Review of Systems   Review of Systems  Physical Exam Updated Vital Signs BP 116/83 (BP Location: Left Wrist)   Pulse 86   Temp 98.2 F (36.8 C) (Oral)   Resp 18   Ht 5\' 4"  (1.626 m)   Wt 75.1 kg   SpO2 95%   BMI 28.42 kg/m  Physical Exam Vitals and nursing note reviewed.  Constitutional:      General: She is not in acute distress.    Appearance: She is well-developed.     Comments: Uncomfortable appearing  HENT:     Head: Normocephalic and atraumatic.  Eyes:     Conjunctiva/sclera: Conjunctivae normal.  Cardiovascular:     Rate and Rhythm: Normal rate and regular rhythm.  Pulmonary:     Effort: Pulmonary effort is normal. No respiratory distress.  Abdominal:     Palpations: Abdomen is soft.  Musculoskeletal:        General: No swelling.     Cervical back: Neck supple.     Right lower leg: Edema (2+ from the top of her foot to proximal thigh.) present.     Left lower leg: No edema.     Comments: Significant tenderness to palpation of the calf and thigh but compartments are compressible.  Distal foot is warm and well-perfused.  Unable to palpate a pulse but DP pulses are Dopplerable.   Skin:    General: Skin is warm and dry.     Capillary Refill: Capillary refill takes less than 2 seconds.  Neurological:     Mental Status: She is alert.  Psychiatric:        Mood and Affect: Mood normal.     ED Results / Procedures / Treatments    Labs (all labs ordered are listed, but only abnormal results are displayed) Labs Reviewed  CBC WITH DIFFERENTIAL/PLATELET - Abnormal; Notable for the following components:      Result Value   MCV 101.0 (*)    MCH 35.9 (*)    All other components within normal limits  BASIC METABOLIC PANEL - Abnormal; Notable for the following components:   Sodium 127 (*)    Chloride 91 (*)    Glucose, Bld 374 (*)    All other components within normal limits  PROTIME-INR - Abnormal; Notable for the following components:   Prothrombin Time 17.3 (*)    INR 1.4 (*)    All other components within normal limits  RESP PANEL BY RT-PCR (RSV, FLU A&B, COVID)  RVPGX2  APTT  HEPARIN LEVEL (UNFRACTIONATED)  HEPARIN LEVEL (UNFRACTIONATED)  HEMOGLOBIN A1C  COMPREHENSIVE METABOLIC PANEL  CK  CREATININE, URINE, RANDOM  OSMOLALITY  OSMOLALITY, URINE  TSH  URINALYSIS, COMPLETE (UACMP) WITH MICROSCOPIC  PREALBUMIN  PHOSPHORUS  MAGNESIUM  HIV ANTIBODY (ROUTINE TESTING W REFLEX)  COMPREHENSIVE METABOLIC PANEL  CBC  MAGNESIUM  PHOSPHORUS  I-STAT BETA HCG BLOOD, ED ([facility], [facility], [facility] ONLY)    EKG None  Radiology CT ABDOMEN PELVIS W CONTRAST  Result Date: 01/27/2023 CLINICAL DATA:  Proximal DVT EXAM: CT ABDOMEN AND PELVIS WITH CONTRAST TECHNIQUE: Multidetector CT imaging of the abdomen and pelvis was performed using the standard protocol following bolus administration of intravenous contrast. RADIATION DOSE REDUCTION: This exam was performed according to the departmental dose-optimization program which includes automated exposure control, adjustment of the mA and/or kV according to patient size and/or use of iterative reconstruction technique. CONTRAST:  24mL OMNIPAQUE IOHEXOL 350 MG/ML SOLN COMPARISON:  11/14/2020 FINDINGS: Lower chest: Mild dependent changes in the lung bases. Prominent esophageal varices. Hepatobiliary: Cirrhotic changes in the liver with enlarged lateral segment left lobe and caudate lobe and with  nodular contour to the liver. There is somewhat poorly defined low-attenuation change in the anterior liver. The previous study demonstrated severe fatty infiltration throughout the liver which has improved since that time. It is likely that the low-attenuation changes reflect some residual heterogeneous fatty infiltration. No specific focal nodules are demonstrated. Cholelithiasis with small stones in the gallbladder. No gallbladder wall thickening or stranding. No bile duct dilatation. The portal veins are patent with prominent portal venous collaterals including periumbilical vein varices and splenic vein varices. Pancreas: Unremarkable. No pancreatic ductal dilatation or surrounding inflammatory changes. Spleen: Spleen size is upper limits of normal.  No focal lesions. Adrenals/Urinary Tract: Adrenal glands are unremarkable. Kidneys are normal, without renal calculi, focal lesion, or hydronephrosis. Bladder is unremarkable. Stomach/Bowel: Stomach,  small bowel, and colon are not abnormally distended. No wall thickening or inflammatory changes. Appendix is normal. Vascular/Lymphatic: Diffuse aortic calcification. No aneurysm. An inferior vena caval filter is present. Stent in the left common iliac vein. No flow is demonstrated in the visualized inferior vena cava below the stent or in the iliac and external iliac veins with mild stranding around the external iliac veins. This likely indicates venous thrombosis. 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 Reproductive: Uterus and bilateral adnexa are unremarkable. Other: Infiltration in the subcutaneous fat around the right hip and right iliopsoas region likely representing edema although infection could also have this appearance. No loculated collections. No free air or free fluid in the abdomen. Musculoskeletal: No acute or significant osseous findings. IMPRESSION: 1. Hepatic cirrhosis with portal venous hypertension  including multiple upper abdominal and esophageal varices as well as mild splenic enlargement. 2. Probable heterogeneous fatty infiltration of the liver. Fatty infiltration has improved since the previous study. 3. Cholelithiasis without CT evidence of acute cholecystitis. 4. Inferior vena caval filter is in place with probable venous thrombosis below this level and extending into the pelvic veins. Infiltration in the right iliopsoas region and soft tissues around the right hip likely indicate edema although infection could also have this appearance. 5. Aortic atherosclerosis. Electronically Signed   By: Burman Nieves M.D.   On: 01/27/2023 18:43   VAS [place] LOWER EXTREMITY VENOUS (DVT) (7a-7p)  Result Date: 01/27/2023  Lower Venous DVT Study Patient Name:  PEGI MILAZZO  Date of Exam:   01/27/2023 Medical Rec #: [id number]                Accession #:    [id number] Date of Birth: 05-14-1971                 Patient Gender: F Patient Age:   29 years Exam Location:  [facility] Procedure:      VAS [place] LOWER EXTREMITY VENOUS (DVT) Referring Phys: Claudette Stapler --------------------------------------------------------------------------------  Indications: Pain, and Swelling. Patient has IVC filter per CT done 11/14/20  Risk Factors: Personal history of DVT. Patient with Cirrhosis Anticoagulation: Coumadin. Patient taken off Warfarin 5 days for dental procedure. Comparison Study: No prior study on file Performing Technologist: Sherren Kerns RVS  Examination Guidelines: A complete evaluation includes B-mode imaging, spectral Doppler, color Doppler, and power Doppler as needed of all accessible portions of each vessel. Bilateral testing is considered an integral part of a complete examination. Limited examinations for reoccurring indications may be performed as noted. The reflux portion of the exam is performed with the patient in reverse Trendelenburg.   +---------+---------------+---------+-----------+----------+-------------------+ RIGHT    CompressibilityPhasicitySpontaneityPropertiesThrombus Aging      +---------+---------------+---------+-----------+----------+-------------------+ CFV      None           No       No                   Acute               +---------+---------------+---------+-----------+----------+-------------------+ SFJ      None                                         Acute               +---------+---------------+---------+-----------+----------+-------------------+ FV Prox  None  No       No                   Acute               +---------+---------------+---------+-----------+----------+-------------------+ FV Mid   None           No       No                   Acute               +---------+---------------+---------+-----------+----------+-------------------+ FV DistalNone           No       No                   Acute               +---------+---------------+---------+-----------+----------+-------------------+ PFV      Partial        No       Yes                  Acute               +---------+---------------+---------+-----------+----------+-------------------+ POP      None           No       No                   Acute               +---------+---------------+---------+-----------+----------+-------------------+ PTV      None                                         Acute prox to mid                                                         portion             +---------+---------------+---------+-----------+----------+-------------------+ PERO     None                                         Acute prox to mid                                                         portion             +---------+---------------+---------+-----------+----------+-------------------+ EIV      None           No       No                   Acute                +---------+---------------+---------+-----------+----------+-------------------+ CIV                     No       No  acute               +---------+---------------+---------+-----------+----------+-------------------+   Right Technical Findings: Not visualized segments include Unable to visualize IVC.  +----+---------------+---------+-----------+----------+-----------------------+ LEFTCompressibilityPhasicitySpontaneityPropertiesThrombus Aging          +----+---------------+---------+-----------+----------+-----------------------+ CFV Full           Yes      Yes                                          +----+---------------+---------+-----------+----------+-----------------------+ EIV                                              Acute                   +----+---------------+---------+-----------+----------+-----------------------+ CIV                                              Acute in distal portion +----+---------------+---------+-----------+----------+-----------------------+   Left Technical Findings: Not visualized segments include Unable to visualize proximal to mid CIV, IVC.   Summary: RIGHT: - Findings consistent with acute deep vein thrombosis involving the right common femoral vein, SF junction, right femoral vein, right proximal profunda vein, right popliteal vein, right posterior tibial veins, right peroneal veins, and EIV, CIV. - A cystic structure is found in the popliteal fossa.  LEFT: - Findings consistent with acute deep vein thrombosis involving the EIV, distal CIV.  *See table(s) above for measurements and observations.    Preliminary     Procedures Procedures   EMERGENCY DEPARTMENT US EXTREMITY EXAM "Study:  Limited Duplex of right lower Extremity Veins"  INDICATIONS: Leg swelling Visualization of  regions in transverse plane with full compression visualized.   PERFORMED BY: Myself IMAGES ARCHIVED?: No VIEWS USED:  Saphenous-femoral junction and Proximal femoral vein INTERPRETATION: DVT visualized      Medications Ordered in ED Medications  heparin ADULT infusion 100 units/mL (25000 units/23mL) (1,250 Units/hr Intravenous New Bag/Given 01/27/23 1850)  insulin aspart (novoLOG) injection 0-9 Units ( Subcutaneous Not Given 01/27/23 2102)  0.9 %  sodium chloride infusion (has no administration in time range)  acetaminophen (TYLENOL) tablet 650 mg (650 mg Oral Given 01/27/23 2110)    Or  acetaminophen (TYLENOL) suppository 650 mg ( Rectal See Alternative 01/27/23 2110)  HYDROcodone-acetaminophen (NORCO/VICODIN) 5-325 MG per tablet 1-2 tablet (2 tablets Oral Given 01/27/23 2110)  morphine (PF) 4 MG/ML injection 4 mg (4 mg Intravenous Given 01/27/23 1634)  ondansetron (ZOFRAN) injection 4 mg (4 mg Intravenous Given 01/27/23 1636)  heparin bolus via infusion 5,000 Units (5,000 Units Intravenous Bolus from Bag 01/27/23 1850)  morphine (PF) 4 MG/ML injection 6 mg (6 mg Intravenous Given 01/27/23 1805)  iohexol (OMNIPAQUE) 350 MG/ML injection 75 mL (75 mLs Intravenous Contrast Given 01/27/23 1825)    ED Course/ Medical Decision Making/ A&P Clinical Course as of 01/27/23 2236  Sat Jan 27, 2023  1758 Dr Unk Lightning from vascular surgery recommends starting the patient on heparin and admitting to medicine.  He will see the patient as a consult.  [RP]  7169 Dr Roel Cluck to admit.  [RP]    Clinical Course User Index [RP]  Rondel Baton, MD                             Medical Decision Making Amount and/or Complexity of Data Reviewed Radiology: ordered.  Risk Prescription drug management. Decision regarding hospitalization.   Tamicka McDonald-Fingal is a 52 y.o. female with comorbidities that complicate the patient evaluation including protein C&S deficiency and NASH cirrhosis who presents emergency department with right lower extremity pain and swelling in the setting of holding her Coumadin  Initial Ddx:  DVT,  phlegmasia cerulea dolens, compartment syndrome, pulmonary embolism  MDM:  Patient appears to have a very large DVT.  No neurovascular compromise and has not yet appeared to develop phlegmasia cerulea dolens but given the extensive swelling that she is having concerned that she may eventually develop this.  Compartments are soft at this time and do not feel that she has compartment syndrome.  This would be especially rare given the fact that it includes her thigh and calf.  No symptoms of PE at this time.  Plan:  Labs Point-of-care ultrasound Pain medication Formal DVT ultrasound  ED Summary/Re-evaluation:  Point care ultrasound was performed that showed DVT.  Patient was ordered for heparin drip.  She was then taken for formal ultrasound which did show extensive clot burden.  They recommended that CT be obtained so CT of the abdomen pelvis with IV contrast was obtained which on my read does show clot extending all the way to her IVC filter.  Discussed with vascular surgery who will consider the patient for thrombectomy.  Admitted to medicine for further management.  This patient presents to the ED for concern of complaints listed in HPI, this involves an extensive number of treatment options, and is a complaint that carries with it a high risk of complications and morbidity. Disposition including potential need for admission considered.   Dispo: Admit to Floor  Records reviewed Outpatient Clinic Notes The following labs were independently interpreted: Chemistry and show  hyponatremia and hyperglycemia without DKA I independently reviewed the following imaging with scope of interpretation limited to determining acute life threatening conditions related to emergency care: CT Abdomen/Pelvis and agree with the radiologist interpretation with the following exceptions: None I personally reviewed and interpreted cardiac monitoring: normal sinus rhythm  I personally reviewed and interpreted the pt's  EKG: see above for interpretation  I have reviewed the patients home medications and made adjustments as needed Consults: Hospitalist and Vascular Surgery   Final Clinical Impression(s) / ED Diagnoses Final diagnoses:  Hyponatremia  Acute deep vein thrombosis (DVT) of femoral vein of right lower extremity [facility])    Rx / DC Orders ED Discharge Orders     None         Rondel Baton, MD 01/27/23 2236

## 2023-01-27 NOTE — Assessment & Plan Note (Addendum)
Order sliding scale patient may need to restart p.o. meds at the time of discharge Appreciate diabetes coordinator consult

## 2023-01-27 NOTE — Assessment & Plan Note (Signed)
Continue to monitor current appears to be well compensated.  Patient no longer drinks alcohol check c-Met

## 2023-01-27 NOTE — ED Notes (Signed)
ED TO INPATIENT HANDOFF REPORT  ED Nurse Name and Phone #: Albina Billet [phone]  S Name/Age/Gender Shelby Conner 52 y.o. female Room/Bed: 013C/013C  Code Status   Code Status: Full Code  Home/SNF/Other Home Patient oriented to: self, place, time, and situation Is this baseline? Yes   Triage Complete: Triage complete  Chief Complaint DVT (deep venous thrombosis) ([place]) [I82.409]  Triage Note Pt reports right thigh pain with leg swelling. Pt reports she has clotting disorder and stopped her coumadin for 5 days for a gum surgery. Dorsalis pedis pulse obtained via doppler.    Allergies No Known Allergies  Level of Care/Admitting Diagnosis ED Disposition   ED Disposition: Admit Condition: None Comment: Hospital Area: [place] [[id number]]  Level of Care: Telemetry Cardiac [[id number]]  May place patient in observation at [facility] or [place] [facility] if equivalent level of care is available:: No  Covid Evaluation: Asymptomatic - no recent exposure (last 10 days) testing not required  Diagnosis: DVT (deep venous thrombosis) [facility]) [[id number]]  Admitting Physician: Lima, [place]  Attending Physician: Toy Baker [[id number]]      B Medical/Surgery History Past Medical History:  Diagnosis Date   Diabetes mellitus without complication ([place])    Fatty liver    Hyperlipidemia    Protein C deficiency ([place])    Protein S deficiency ([place])    Past Surgical History:  Procedure Laterality Date   ANKLE SURGERY Right    BIOPSY  11/16/2020   Procedure: BIOPSY;  Surgeon: Otis Brace, MD;  Location: [place];  Service: Gastroenterology;;   CESAREAN SECTION     ESOPHAGOGASTRODUODENOSCOPY N/A 11/16/2020   Procedure: ESOPHAGOGASTRODUODENOSCOPY (EGD);  Surgeon: Otis Brace, MD;  Location: [facility] ENDOSCOPY;  Service: Gastroenterology;  Laterality: N/A;   IR PARACENTESIS  11/12/2020   KNEE SURGERY Left      A IV Location/Drains/Wounds Patient  Lines/Drains/Airways Status     Active Line/Drains/Airways     Name Placement date Placement time Site Days   Peripheral IV 01/27/23 20 G Right Antecubital 01/27/23  1544  Antecubital  less than 1            Intake/Output Last 24 hours No intake or output data in the 24 hours ending 01/27/23 1904  Labs/Imaging Results for orders placed or performed during the hospital encounter of 01/27/23 (from the past 48 hour(s))  CBC with Differential     Status: Abnormal   Collection Time: 01/27/23  2:47 PM  Result Value Ref Range   WBC 10.2 4.0 - 10.5 K/uL   RBC 3.90 3.87 - 5.11 MIL/uL   Hemoglobin 14.0 12.0 - 15.0 g/dL   HCT 39.4 36.0 - 46.0 %   MCV 101.0 (H) 80.0 - 100.0 fL   MCH 35.9 (H) 26.0 - 34.0 pg   MCHC 35.5 30.0 - 36.0 g/dL   RDW 12.5 11.5 - 15.5 %   Platelets PLATELET CLUMPS NOTED ON SMEAR, UNABLE TO ESTIMATE 150 - 400 K/uL    Comment: Immature Platelet Fraction may be clinically indicated, consider ordering this additional test [id number] PLATELET CLUMPS NOTED ON SMEAR, UNABLE TO ESTIMATE    nRBC 0.0 0.0 - 0.2 %   Neutrophils Relative % 65 %   Neutro Abs 6.6 1.7 - 7.7 K/uL   Lymphocytes Relative 25 %   Lymphs Abs 2.6 0.7 - 4.0 K/uL   Monocytes Relative 6 %   Monocytes Absolute 0.6 0.1 - 1.0 K/uL   Eosinophils Relative 2 %   Eosinophils Absolute 0.2 0.0 -  0.5 K/uL   Basophils Relative 1 %   Basophils Absolute 0.1 0.0 - 0.1 K/uL   Immature Granulocytes 1 %   Abs Immature Granulocytes 0.06 0.00 - 0.07 K/uL    Comment: Performed at [facility], [place] [address], [place], [place] [address]  Basic metabolic panel     Status: Abnormal   Collection Time: 01/27/23  2:47 PM  Result Value Ref Range   Sodium 127 (L) 135 - 145 mmol/L   Potassium 4.9 3.5 - 5.1 mmol/L   Chloride 91 (L) 98 - 111 mmol/L   CO2 24 22 - 32 mmol/L   Glucose, Bld 374 (H) 70 - 99 mg/dL    Comment: Glucose reference range applies only to samples taken after fasting for at least 8 hours.   BUN  12 6 - 20 mg/dL   Creatinine, Ser 0.80 0.44 - 1.00 mg/dL   Calcium 9.5 8.9 - 10.3 mg/dL   GFR, Estimated >60 >60 mL/min    Comment: (NOTE) Calculated using the CKD-EPI Creatinine Equation (2021)    Anion gap 12 5 - 15    Comment: Performed at [facility] [address] [place], [place] [address]  Protime-INR     Status: Abnormal   Collection Time: 01/27/23  4:30 PM  Result Value Ref Range   Prothrombin Time 17.3 (H) 11.4 - 15.2 seconds   INR 1.4 (H) 0.8 - 1.2    Comment: (NOTE) INR goal varies based on device and disease states. Performed at [facility], [place] [address], [place], [place] [address]   APTT     Status: None   Collection Time: 01/27/23  4:30 PM  Result Value Ref Range   aPTT 28 24 - 36 seconds    Comment: Performed at [facility] [address], [place], [place] [address]  I-Stat Beta hCG blood, ED ([facility], [facility], [facility] only)     Status: None   Collection Time: 01/27/23  5:03 PM  Result Value Ref Range   I-stat hCG, quantitative <5.0 <5 mIU/mL   Comment 3            Comment:   GEST. AGE      CONC.  (mIU/mL)   <=1 WEEK        5 - 50     2 WEEKS       50 - 500     3 WEEKS       100 - 10,000     4 WEEKS     1,000 - 30,000        FEMALE AND NON-PREGNANT FEMALE:     LESS THAN 5 mIU/mL    CT ABDOMEN PELVIS W CONTRAST  Result Date: 01/27/2023 CLINICAL DATA:  Proximal DVT EXAM: CT ABDOMEN AND PELVIS WITH CONTRAST TECHNIQUE: Multidetector CT imaging of the abdomen and pelvis was performed using the standard protocol following bolus administration of intravenous contrast. RADIATION DOSE REDUCTION: This exam was performed according to the departmental dose-optimization program which includes automated exposure control, adjustment of the mA and/or kV according to patient size and/or use of iterative reconstruction technique. CONTRAST:  68mL OMNIPAQUE IOHEXOL 350 MG/ML SOLN COMPARISON:  11/14/2020 FINDINGS: Lower chest: Mild dependent changes in the lung bases.  Prominent esophageal varices. Hepatobiliary: Cirrhotic changes in the liver with enlarged lateral segment left lobe and caudate lobe and with nodular contour to the liver. There is somewhat poorly defined low-attenuation change in the anterior liver. The previous  study demonstrated severe fatty infiltration throughout the liver which has improved since that time. It is likely that the low-attenuation changes reflect some residual heterogeneous fatty infiltration. No specific focal nodules are demonstrated. Cholelithiasis with small stones in the gallbladder. No gallbladder wall thickening or stranding. No bile duct dilatation. The portal veins are patent with prominent portal venous collaterals including periumbilical vein varices and splenic vein varices. Pancreas: Unremarkable. No pancreatic ductal dilatation or surrounding inflammatory changes. Spleen: Spleen size is upper limits of normal.  No focal lesions. Adrenals/Urinary Tract: Adrenal glands are unremarkable. Kidneys are normal, without renal calculi, focal lesion, or hydronephrosis. Bladder is unremarkable. Stomach/Bowel: Stomach, small bowel, and colon are not abnormally distended. No wall thickening or inflammatory changes. Appendix is normal. Vascular/Lymphatic: Diffuse aortic calcification. No aneurysm. An inferior vena caval filter is present. Stent in the left common iliac vein. No flow is demonstrated in the visualized inferior vena cava below the stent or in the iliac and external iliac veins with mild stranding around the external iliac veins. This likely indicates venous thrombosis. 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 Reproductive: Uterus and bilateral adnexa are unremarkable. Other: Infiltration in the subcutaneous fat around the right hip and right iliopsoas region likely representing edema although infection could also have this appearance. No loculated collections. No free air or free  fluid in the abdomen. Musculoskeletal: No acute or significant osseous findings. IMPRESSION: 1. Hepatic cirrhosis with portal venous hypertension including multiple upper abdominal and esophageal varices as well as mild splenic enlargement. 2. Probable heterogeneous fatty infiltration of the liver. Fatty infiltration has improved since the previous study. 3. Cholelithiasis without CT evidence of acute cholecystitis. 4. Inferior vena caval filter is in place with probable venous thrombosis below this level and extending into the pelvic veins. Infiltration in the right iliopsoas region and soft tissues around the right hip likely indicate edema although infection could also have this appearance. 5. Aortic atherosclerosis. Electronically Signed   By: Lucienne Capers M.D.   On: 01/27/2023 18:43   VAS [place] LOWER EXTREMITY VENOUS (DVT) (7a-7p)  Result Date: 01/27/2023  Lower Venous DVT Study Patient Name:  WING GFELLER  Date of Exam:   01/27/2023 Medical Rec #: [id number]                Accession #:    [id number] Date of Birth: 1971-12-20                 Patient Gender: F Patient Age:   32 years Exam Location:  [facility] Procedure:      VAS [place] LOWER EXTREMITY VENOUS (DVT) Referring Phys: Charmaine Downs --------------------------------------------------------------------------------  Indications: Pain, and Swelling. Patient has IVC filter per CT done 11/14/20  Risk Factors: Personal history of DVT. Patient with Cirrhosis Anticoagulation: Coumadin. Patient taken off Warfarin 5 days for dental procedure. Comparison Study: No prior study on file Performing Technologist: Sharion Dove RVS  Examination Guidelines: A complete evaluation includes B-mode imaging, spectral Doppler, color Doppler, and power Doppler as needed of all accessible portions of each vessel. Bilateral testing is considered an integral part of a complete examination. Limited examinations for reoccurring indications may be performed as  noted. The reflux portion of the exam is performed with the patient in reverse Trendelenburg.  +---------+---------------+---------+-----------+----------+-------------------+ RIGHT    CompressibilityPhasicitySpontaneityPropertiesThrombus Aging      +---------+---------------+---------+-----------+----------+-------------------+ CFV      None           No  No                   Acute               +---------+---------------+---------+-----------+----------+-------------------+ SFJ      None                                         Acute               +---------+---------------+---------+-----------+----------+-------------------+ FV Prox  None           No       No                   Acute               +---------+---------------+---------+-----------+----------+-------------------+ FV Mid   None           No       No                   Acute               +---------+---------------+---------+-----------+----------+-------------------+ FV DistalNone           No       No                   Acute               +---------+---------------+---------+-----------+----------+-------------------+ PFV      Partial        No       Yes                  Acute               +---------+---------------+---------+-----------+----------+-------------------+ POP      None           No       No                   Acute               +---------+---------------+---------+-----------+----------+-------------------+ PTV      None                                         Acute prox to mid                                                         portion             +---------+---------------+---------+-----------+----------+-------------------+ PERO     None                                         Acute prox to mid  portion              +---------+---------------+---------+-----------+----------+-------------------+ EIV      None           No       No                   Acute               +---------+---------------+---------+-----------+----------+-------------------+ CIV                     No       No                   acute               +---------+---------------+---------+-----------+----------+-------------------+   Right Technical Findings: Not visualized segments include Unable to visualize IVC.  +----+---------------+---------+-----------+----------+-----------------------+ LEFTCompressibilityPhasicitySpontaneityPropertiesThrombus Aging          +----+---------------+---------+-----------+----------+-----------------------+ CFV Full           Yes      Yes                                          +----+---------------+---------+-----------+----------+-----------------------+ EIV                                              Acute                   +----+---------------+---------+-----------+----------+-----------------------+ CIV                                              Acute in distal portion +----+---------------+---------+-----------+----------+-----------------------+   Left Technical Findings: Not visualized segments include Unable to visualize proximal to mid CIV, IVC.   Summary: RIGHT: - Findings consistent with acute deep vein thrombosis involving the right common femoral vein, SF junction, right femoral vein, right proximal profunda vein, right popliteal vein, right posterior tibial veins, right peroneal veins, and EIV, CIV. - A cystic structure is found in the popliteal fossa.  LEFT: - Findings consistent with acute deep vein thrombosis involving the EIV, distal CIV.  *See table(s) above for measurements and observations.    Preliminary     Pending Labs Unresulted Labs (From admission, onward)     Start     Ordered   01/28/23 0500  Heparin level (unfractionated)  Daily,   R       01/27/23 1726   01/28/23 0500  Prealbumin  Tomorrow morning,   R        01/27/23 1856   01/28/23 0000  Heparin level (unfractionated)  Once-Timed,   URGENT        01/27/23 1726   01/27/23 2200  Comprehensive metabolic panel  Once,   R        01/27/23 1855   01/27/23 1857  CK  Add-on,   AD        01/27/23 1856   01/27/23 1857  Creatinine, urine, random  Once,   R        01/27/23 1856   01/27/23 1857  Osmolality  Add-on,   AD  01/27/23 1856   01/27/23 1857  Osmolality, urine  Once,   R        01/27/23 1856   01/27/23 1857  TSH  Add-on,   AD        01/27/23 1856   01/27/23 1857  Urinalysis, Complete w Microscopic -Urine, Clean Catch  Once,   R       Question Answer Comment  Release to patient Immediate   Specimen Source Urine, Clean Catch      01/27/23 1856   01/27/23 1857  Phosphorus  Add-on,   AD        01/27/23 1856   01/27/23 1857  Magnesium  Add-on,   AD        01/27/23 1856   01/27/23 1846  Hemoglobin A1c  Once,   R       Comments: To assess prior glycemic control    01/27/23 1845   01/27/23 1843  Resp panel by RT-PCR (RSV, Flu A&B, Covid) Anterior Nasal Swab  (Tier 2 - SymptomaticResp panel by RT-PCR (RSV, Flu A&B, Covid))  Once,   URGENT        01/27/23 1842   Signed and Held  HIV Antibody (routine testing w rflx)  (HIV Antibody (Routine testing w reflex) panel)  Once,   R        Signed and Held   Signed and Held  Comprehensive metabolic panel  Tomorrow morning,   R       Question:  Release to patient  Answer:  Immediate   Signed and Held   Signed and Held  CBC  Tomorrow morning,   R       Question:  Release to patient  Answer:  Immediate   Signed and Held   Signed and Held  Magnesium  Tomorrow morning,   R        Signed and Held   Signed and Held  Phosphorus  Tomorrow morning,   R        Signed and Held            Vitals/Pain Today's Vitals   01/27/23 1722 01/27/23 1800 01/27/23 1852 01/27/23 1852  BP:      Pulse:      Resp:      Temp:   99.2 F  (37.3 C)   TempSrc:   Oral   SpO2:      Weight: 75.1 kg     Height: 5\' 4"  (1.626 m)     PainSc:  10-Worst pain ever  4     Isolation Precautions Airborne and Contact precautions  Medications Medications  heparin ADULT infusion 100 units/mL (25000 units/240mL) (1,250 Units/hr Intravenous New Bag/Given 01/27/23 1850)  insulin aspart (novoLOG) injection 0-9 Units (has no administration in time range)  morphine (PF) 4 MG/ML injection 4 mg (4 mg Intravenous Given 01/27/23 1634)  ondansetron (ZOFRAN) injection 4 mg (4 mg Intravenous Given 01/27/23 1636)  heparin bolus via infusion 5,000 Units (5,000 Units Intravenous Bolus from Bag 01/27/23 1850)  morphine (PF) 4 MG/ML injection 6 mg (6 mg Intravenous Given 01/27/23 1805)  iohexol (OMNIPAQUE) 350 MG/ML injection 75 mL (75 mLs Intravenous Contrast Given 01/27/23 1825)    Mobility walks with person assist     Focused Assessments Peripheral vascular     R Recommendations: See Admitting Provider Note  Report given to:   Additional Notes: Right knee is swollen. Pain is much better than it was. She states that her pain is radiating from  groin to the knee.

## 2023-01-27 NOTE — Assessment & Plan Note (Signed)
On chronic Coumadin Patient states status post IVC filter

## 2023-01-27 NOTE — Subjective & Objective (Signed)
Pt started to have leg swelling 3 days ago Has hx of Protein c and S defiency on coumadin has been held for dental procedure She have done that in the past No travel, no hormonal meds No CP no SOB Low grade fever on presentation  No severe cough Has hx of liver cirrhosis

## 2023-01-27 NOTE — ED Provider Triage Note (Signed)
Emergency Medicine Provider Triage Evaluation Note  Shelby Conner , a 52 y.o. female  was evaluated in triage.  Pt complains of right lower extremity edema that started yesterday.  Was off warfarin for a few days prior to gum surgery.  Restarted warfarin yesterday.  She notes she is still subtherapeutic.  Denies chest pain and shortness of breath.  Previous PE.  Review of Systems  Positive: Lower extremity edema Negative: CP  Physical Exam  BP (!) 137/90 (BP Location: Right Arm)   Pulse 86   Temp 100.1 F (37.8 C) (Oral)   Resp 16   SpO2 95%  Gen:   Awake, no distress   Resp:  Normal effort  MSK:   Moves extremities without difficulty  Other:    Medical Decision Making  Medically screening exam initiated at 2:47 PM.  Appropriate orders placed.  Shelby Conner was informed that the remainder of the evaluation will be completed by another provider, this initial triage assessment does not replace that evaluation, and the importance of remaining in the ED until their evaluation is complete.  Labs [place] to rule out DVT Doppler used at bedside to find pedal pulse. Low suspicion for arterial occlusion   Suzy Bouchard, PA-C 01/27/23 1451

## 2023-01-27 NOTE — Progress Notes (Signed)
VASCULAR LAB    Right lower extremity venous duplex has been performed.  See CV proc for preliminary results.   Raegen Tarpley, RVT 01/27/2023, 5:53 PM

## 2023-01-27 NOTE — Progress Notes (Signed)
ANTICOAGULATION CONSULT NOTE - Initial Consult  Pharmacy Consult for Heparin Indication: DVT  No Known Allergies  Patient Measurements:   Heparin Dosing Weight: 70kg  Vital Signs: Temp: 100.1 F (37.8 C) (02/03 1427) Temp Source: Oral (02/03 1427) BP: 137/90 (02/03 1427) Pulse Rate: 86 (02/03 1427)  Labs: Recent Labs    01/27/23 1447 01/27/23 1630  HGB 14.0  --   HCT 39.4  --   PLT PLATELET CLUMPS NOTED ON SMEAR, UNABLE TO ESTIMATE  --   APTT  --  28  LABPROT  --  17.3*  INR  --  1.4*  CREATININE 0.80  --     CrCl cannot be calculated (Unknown ideal weight.).   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 Patient reports being s/p IVC filter. Patient is presenting with leg pain and swelling. Heparin per pharmacy consult placed for DVT.  Patient is on warfarin prior to arrival but has stopped x 5 days for gum surgery. Per patient takes 6mg  daily prior to that.  Hgb 14; plt CLUMPING ON SMEAR PT / INR 17.3 / 1.4  Goal of Therapy:  Heparin level 0.3-0.7 units/ml Monitor platelets by anticoagulation protocol: Yes   Plan:  Give IV heparin 5000 units bolus x 1 Start heparin infusion at 1250 units/hr Check anti-Xa level in 6 hours and daily while on heparin Continue to monitor H&H and platelets  Lorelei Pont, PharmD, BCPS 01/27/2023 5:04 PM ED Clinical Pharmacist -  [phone]

## 2023-01-27 NOTE — Consult Note (Signed)
Hospital Consult    Reason for Consult: Right lower extremity DVT Requesting Physician: ED MRN #:  [id number]  History of Present Illness: This is a 52 y.o. female with known factor S, factor C deficiency who presents to the hospital with 3-day history of right lower extremity swelling.  Hypercoagulable history includes bilateral lower extremity DVTs at the time of childbirth, prior IVC filter-op T's, placed in 2005, previous left-sided common iliac vein stenting-all of which completed in [place] prior to moving to [place] in 2008.  On exam today, Shelby Conner was resting comfortably.  She noted significant swelling in the right lower extremity which began on Thursday, and had progressed over the last 3 days.  She was unable to walk earlier today which prompted her visit to the emergency department.  She denies sensorimotor deficits.  Currently on heparin drip.   Past Medical History:  Diagnosis Date   Diabetes mellitus without complication ([place])    Fatty liver    Hyperlipidemia    Protein C deficiency ([place])    Protein S deficiency ([place])     Past Surgical History:  Procedure Laterality Date   ANKLE SURGERY Right    BIOPSY  11/16/2020   Procedure: BIOPSY;  Surgeon: Otis Brace, MD;  Location: [facility];  Service: Gastroenterology;;   CESAREAN SECTION     ESOPHAGOGASTRODUODENOSCOPY N/A 11/16/2020   Procedure: ESOPHAGOGASTRODUODENOSCOPY (EGD);  Surgeon: Otis Brace, MD;  Location: [facility];  Service: Gastroenterology;  Laterality: N/A;   IR PARACENTESIS  11/12/2020   KNEE SURGERY Left     No Known Allergies  Prior to Admission medications   Medication Sig Start Date End Date Taking? Authorizing Provider  ibuprofen (ADVIL) 200 MG tablet Take 200-400 mg by mouth 2 (two) times daily as needed for headache, mild pain or moderate pain.   Yes [provider]  potassium chloride (KLOR-CON) 10 MEQ tablet Take 10 mEq by mouth daily.   Yes [provider]  Tetrahydrozoline HCl (VISINE OP) Place 1 drop into both eyes 2 (two) times daily as needed (dry eyes, itchy eyes).   Yes [provider]  warfarin (COUMADIN) 6 MG tablet Take 6 mg by mouth daily.   Yes [provider]  feeding supplement (ENSURE ENLIVE / ENSURE PLUS) LIQD Take 237 mLs by mouth 3 (three) times daily between meals. Patient not taking: Reported on 01/27/2023 11/17/20   Mckinley Jewel, MD  Multiple Vitamin (MULTIVITAMIN WITH MINERALS) TABS tablet Take 1 tablet by mouth daily. Patient not taking: Reported on 01/27/2023 11/18/20   Pahwani, Michell Heinrich, MD  ondansetron (ZOFRAN-ODT) 4 MG disintegrating tablet Take 1 tablet (4 mg total) by mouth every 8 (eight) hours as needed for nausea or vomiting. Patient not taking: Reported on 01/27/2023 07/25/22   Antonietta Breach, PA-C  pantoprazole (PROTONIX) 40 MG tablet Take 1 tablet (40 mg total) by mouth 2 (two) times daily. Patient not taking: Reported on 01/27/2023 11/17/20   Mckinley Jewel, MD  warfarin (COUMADIN) 1 MG tablet Take 1 tablet (1 mg total) by mouth daily. Patient not taking: Reported on 01/27/2023 12/11/20 01/10/21  Terrilee Croak, MD    Social History   Socioeconomic History   Marital status: Divorced    Spouse name: Not on file   Number of children: Not on file   Years of education: Not on file   Highest education level: Not on file  Occupational History   Not on file  Tobacco Use   Smoking status: Former  Smokeless tobacco: Never  Vaping Use   Vaping Use: Never used  Substance and Sexual Activity   Alcohol use: Yes    Comment: occasionally   Drug use: Never   Sexual activity: Not on file  Other Topics Concern   Not on file  Social History Narrative   Not on file   Social Determinants of Health   Financial Resource Strain: Not on file  Food Insecurity: Not on file  Transportation Needs: Not on file  Physical Activity: Not on file  Stress: Not on file  Social Connections: Not on file   Intimate Partner Violence: Not on file   Family History  Problem Relation Age of Onset   Liver disease Neg Hx     ROS: Otherwise negative unless mentioned in HPI  Physical Examination  Vitals:   01/27/23 1852 01/27/23 2050  BP:  116/83  Pulse:    Resp:  18  Temp: 99.2 F (37.3 C) 98.2 F (36.8 C)  SpO2:  95%   Body mass index is 28.42 kg/m.  General:  WDWN in NAD Gait: Not observed HENT: WNL, normocephalic Pulmonary: normal non-labored breathing, without Rales, rhonchi,  wheezing Cardiac: regular Abdomen: soft, NT/ND, no masses Skin: without rashes Vascular Exam/Pulses: 2+ dorsalis pedis pulses bilaterally.  Asymmetric's edema in the right lower extremity.  Asymptomatic in the left lower extremity Extremities: without ischemic changes, without Gangrene , without cellulitis; without open wounds;  Musculoskeletal: no muscle wasting or atrophy  Neurologic: A&O X 3;  No focal weakness or paresthesias are detected; speech is fluent/normal Psychiatric:  The pt has Normal affect. Lymph:  Unremarkable  CBC    Component Value Date/Time   WBC 10.2 01/27/2023 1447   RBC 3.90 01/27/2023 1447   HGB 14.0 01/27/2023 1447   HCT 39.4 01/27/2023 1447   PLT PLATELET CLUMPS NOTED ON SMEAR, UNABLE TO ESTIMATE 01/27/2023 1447   MCV 101.0 (H) 01/27/2023 1447   MCH 35.9 (H) 01/27/2023 1447   MCHC 35.5 01/27/2023 1447   RDW 12.5 01/27/2023 1447   LYMPHSABS 2.6 01/27/2023 1447   MONOABS 0.6 01/27/2023 1447   EOSABS 0.2 01/27/2023 1447   BASOSABS 0.1 01/27/2023 1447    BMET    Component Value Date/Time   NA 127 (L) 01/27/2023 1447   K 4.9 01/27/2023 1447   CL 91 (L) 01/27/2023 1447   CO2 24 01/27/2023 1447   GLUCOSE 374 (H) 01/27/2023 1447   BUN 12 01/27/2023 1447   CREATININE 0.80 01/27/2023 1447   CALCIUM 9.5 01/27/2023 1447   GFRNONAA >60 01/27/2023 1447   GFRAA  10/22/2009 0450    >60        The eGFR has been calculated using the MDRD equation. This calculation has  not been validated in all clinical situations. eGFR's persistently <60 mL/min signify possible Chronic Kidney Disease.    COAGS: Lab Results  Component Value Date   INR 1.4 (H) 01/27/2023   INR 1.6 (H) 07/24/2022   INR 2.9 (H) 07/13/2021       ASSESSMENT/PLAN: This is a 52 y.o. female with history of protein C, protein S deficiency who presents with a 3-day history of right lower extremity pain and extensive DVT demonstrated on ultrasound.  Follow-up CT scan demonstrated occlusion up to the patient's previous IVC filter.  Fortunately, she is not symptomatic in the right lower extremity, questioning whether the previous stent had thrombosed at an earlier date.  Regardless, Shelby Conner would benefit from percutaneous ileal caval thrombectomy.  My initial  plan was to remove the filter as well, but being that she had a new DVT when holding anticoagulation for a dental procedure, she would benefit from lifelong filter placement.   Fortunately, Shelby Conner does not have phlegmasia.  She is been started on a heparin drip.  Plan for operative intervention, percutaneous mechanical thrombectomy on Monday.  After discussing the risk and benefits of the above, Shelby Conner elected to proceed.  I will see her tomorrow, and answer any questions that her husband has either in person or by phone.  Please call if any questions or concerns arise Please continue heparin Please initiate 81 mg aspirin daily   Cassandria Santee MD [facility] [phone] 01/27/2023  9:00 PM

## 2023-01-27 NOTE — Assessment & Plan Note (Signed)
Right leg large DVT  Off coumadin for dental procedure INR subtheraputic  Now on heparin drip  Appreciate vascular consult Bed rest  NPO post midnight

## 2023-01-27 NOTE — H&P (Addendum)
Shelby Conner NLG:[id number] DOB: July 31, 1971 DOA: 01/27/2023     PCP: Joaquin Music, NP   Outpatient Specialists:  NONE    Patient arrived to ER on 01/27/23 at 1409 Referred by Attending Rondel Baton, MD   Patient coming from:    home Lives With family    Chief Complaint:   Chief Complaint  Patient presents with   Leg Pain    HPI: Shelby Conner is a 52 y.o. female with medical history significant of protein s and C deficiency, liver cirrhoses and DM2     Presented with   right leg swelling Pt started to have leg swelling 3 days ago Has hx of Protein c and S defiency on coumadin has been held for dental procedure She have done that in the past No travel, no hormonal meds No CP no SOB Low grade fever on presentation  No severe cough Has hx of liver cirrhosis     in house  PCR testing  Pending  Lab Results  Component Value Date   SARSCOV2NAA NEGATIVE 07/25/2022   SARSCOV2NAA NEGATIVE 12/02/2020   SARSCOV2NAA NEGATIVE 11/11/2020     Regarding pertinent Chronic problems:         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      Liver disease MELD 3.0: 15 at 07/24/2022 11:40 PM MELD-Na: 15 at 07/24/2022 11:40 PM Calculated from: Serum Creatinine: 0.60 mg/dL (Using min of 1 mg/dL) at 0/81/4481 85:63 PM Serum Sodium: 135 mmol/L at 07/24/2022 11:40 PM Total Bilirubin: 1.4 mg/dL at 1/49/7026 37:85 PM Serum Albumin: 3.9 g/dL (Using max of 3.5 g/dL) at 8/85/0277 41:28 PM INR(ratio): 1.6 at 07/24/2022 11:40 PM Age at listing (hypothetical): 51 years Sex: Female at 07/24/2022 11:40 PM   While in ER: Clinical Course as of 01/27/23 1841  Sat Jan 27, 2023  1758 Dr Sherral Hammers from vascular surgery recommends starting the patient on heparin and admitting to medicine.  He will see the patient as a consult.  [RP]  1829 Dr Adela Glimpse to admit.  [RP]    Clinical Course User Index [RP] Rondel Baton, MD     CTabd/pelvis -  Hepatic cirrhosis  with portal venous hypertension including multiple upper abdominal and esophageal varices as well as mild splenic enlargement.  Inferior vena caval filter is in place with probable venous thrombosis below this level and extending into the pelvic veins. Infiltration in the right iliopsoas region and soft tissues around the right hip likely indicate edema  Following Medications were ordered in ER: Medications  heparin ADULT infusion 100 units/mL (25000 units/271mL) (has no administration in time range)  heparin bolus via infusion 5,000 Units (has no administration in time range)  morphine (PF) 4 MG/ML injection 4 mg (4 mg Intravenous Given 01/27/23 1634)  ondansetron (ZOFRAN) injection 4 mg (4 mg Intravenous Given 01/27/23 1636)  morphine (PF) 4 MG/ML injection 6 mg (6 mg Intravenous Given 01/27/23 1805)  iohexol (OMNIPAQUE) 350 MG/ML injection 75 mL (75 mLs Intravenous Contrast Given 01/27/23 1825)    _______________________________________________________ ER Provider Called:  vascular surgeon   Dr.Robins They Recommend admit to medicine   Will see in AM    ED Triage Vitals  Enc Vitals Group     BP 01/27/23 1427 (!) 137/90     Pulse Rate 01/27/23 1427 86     Resp 01/27/23 1427 16     Temp 01/27/23 1427 100.1 F (37.8 C)     Temp Source 01/27/23 1427 Oral  SpO2 01/27/23 1427 95 %     Weight 01/27/23 1722 165 lb 9.1 oz (75.1 kg)     Height 01/27/23 1722 5\' 4"  (1.626 m)     Head Circumference --      Peak Flow --      Pain Score 01/27/23 1431 0     Pain Loc --      Pain Edu? --      Excl. in [place]? --   TMAX(24)@     _________________________________________ Significant initial  Findings: Abnormal Labs Reviewed  CBC WITH DIFFERENTIAL/PLATELET - Abnormal; Notable for the following components:      Result Value   MCV 101.0 (*)    MCH 35.9 (*)    All other components within normal limits  BASIC METABOLIC PANEL - Abnormal; Notable for the following components:   Sodium 127 (*)     Chloride 91 (*)    Glucose, Bld 374 (*)    All other components within normal limits  PROTIME-INR - Abnormal; Notable for the following components:   Prothrombin Time 17.3 (*)    INR 1.4 (*)    All other components within normal limits     ECG: Ordered     The recent clinical data is shown below. Vitals:   01/27/23 1427 01/27/23 1722  BP: (!) 137/90   Pulse: 86   Resp: 16   Temp: 100.1 F (37.8 C)   TempSrc: Oral   SpO2: 95%   Weight:  75.1 kg  Height:  5\' 4"  (1.626 m)    WBC     Component Value Date/Time   WBC 10.2 01/27/2023 1447   LYMPHSABS 2.6 01/27/2023 1447   MONOABS 0.6 01/27/2023 1447   EOSABS 0.2 01/27/2023 1447   BASOSABS 0.1 01/27/2023 1447      UA  ordered      _______________________________________________ Hospitalist was called for admission for right leg DVT    The following Work up has been ordered so far:  Orders Placed This Encounter  Procedures   CT ABDOMEN PELVIS W CONTRAST   CBC with Differential   Basic metabolic panel   Protime-INR   APTT   Heparin level (unfractionated)   Heparin level (unfractionated)   heparin per pharmacy consult   monitoring by pharmacy   Consult to vascular surgery   Consult for [facility] Medical Admission   I-Stat Beta hCG blood, ED ([facility], [facility], [facility] only)   VAS [place] LOWER EXTREMITY VENOUS (DVT) (7a-7p)     OTHER Significant initial  Findings:  labs showing:    Recent Labs  Lab 01/27/23 1447  NA 127*  K 4.9  CO2 24  GLUCOSE 374*  BUN 12  CREATININE 0.80  CALCIUM 9.5    Cr   stable,    Lab Results  Component Value Date   CREATININE 0.80 01/27/2023   CREATININE 0.60 07/24/2022   CREATININE 0.76 07/13/2021     Plt: Lab Results  Component Value Date   PLT PLATELET CLUMPS NOTED ON SMEAR, UNABLE TO ESTIMATE 01/27/2023      COVID-19 Labs  No results for input(s): "DDIMER", "FERRITIN", "LDH", "CRP" in the last 72 hours.  Lab Results  Component Value Date   SARSCOV2NAA NEGATIVE 07/25/2022    SARSCOV2NAA NEGATIVE 12/02/2020   [place] NEGATIVE 11/11/2020         Recent Labs  Lab 01/27/23 1447  WBC 10.2  NEUTROABS 6.6  HGB 14.0  HCT 39.4  MCV 101.0*  PLT PLATELET CLUMPS NOTED ON SMEAR,  UNABLE TO ESTIMATE    HG/HCT  stable,       Component Value Date/Time   HGB 14.0 01/27/2023 1447   HCT 39.4 01/27/2023 1447   MCV 101.0 (H) 01/27/2023 1447   Oh       Cultures:    Component Value Date/Time   SDES URINE, CLEAN CATCH 12/05/2020 1040   SPECREQUEST  12/05/2020 1040    NONE Performed at [facility], [address], [place], [place] [address]    CULT MULTIPLE SPECIES PRESENT, SUGGEST RECOLLECTION (A) 12/05/2020 1040   REPTSTATUS 12/06/2020 FINAL 12/05/2020 1040     Radiological Exams on Admission: VAS [place] LOWER EXTREMITY VENOUS (DVT) (7a-7p)  Result Date: 01/27/2023  Lower Venous DVT Study Patient Name:  Shelby Conner  Date of Exam:   01/27/2023 Medical Rec #: [id number]                Accession #:    [id number] Date of Birth: [birth year]                 Patient Gender: F Patient Age:   46 years Exam Location:  [facility] Procedure:      VAS [place] LOWER EXTREMITY VENOUS (DVT) Referring Phys: Claudette Stapler --------------------------------------------------------------------------------  Indications: Pain, and Swelling. Patient has IVC filter per CT done 11/14/20  Risk Factors: Personal history of DVT. Patient with Cirrhosis Anticoagulation: Coumadin. Patient taken off Warfarin 5 days for dental procedure. Comparison Study: No prior study on file Performing Technologist: Sherren Kerns RVS  Examination Guidelines: A complete evaluation includes B-mode imaging, spectral Doppler, color Doppler, and power Doppler as needed of all accessible portions of each vessel. Bilateral testing is considered an integral part of a complete examination. Limited examinations for reoccurring indications may be performed as noted. The reflux portion of the exam is  performed with the patient in reverse Trendelenburg.  +---------+---------------+---------+-----------+----------+-------------------+ RIGHT    CompressibilityPhasicitySpontaneityPropertiesThrombus Aging      +---------+---------------+---------+-----------+----------+-------------------+ CFV      None           No       No                   Acute               +---------+---------------+---------+-----------+----------+-------------------+ SFJ      None                                         Acute               +---------+---------------+---------+-----------+----------+-------------------+ FV Prox  None           No       No                   Acute               +---------+---------------+---------+-----------+----------+-------------------+ FV Mid   None           No       No                   Acute               +---------+---------------+---------+-----------+----------+-------------------+ FV DistalNone           No       No  Acute               +---------+---------------+---------+-----------+----------+-------------------+ PFV      Partial        No       Yes                  Acute               +---------+---------------+---------+-----------+----------+-------------------+ POP      None           No       No                   Acute               +---------+---------------+---------+-----------+----------+-------------------+ PTV      None                                         Acute prox to mid                                                         portion             +---------+---------------+---------+-----------+----------+-------------------+ PERO     None                                         Acute prox to mid                                                         portion             +---------+---------------+---------+-----------+----------+-------------------+ EIV      None           No        No                   Acute               +---------+---------------+---------+-----------+----------+-------------------+ CIV                     No       No                   acute               +---------+---------------+---------+-----------+----------+-------------------+   Right Technical Findings: Not visualized segments include Unable to visualize IVC.  +----+---------------+---------+-----------+----------+-----------------------+ LEFTCompressibilityPhasicitySpontaneityPropertiesThrombus Aging          +----+---------------+---------+-----------+----------+-----------------------+ CFV Full           Yes      Yes                                          +----+---------------+---------+-----------+----------+-----------------------+ EIV  Acute                   +----+---------------+---------+-----------+----------+-----------------------+ CIV                                              Acute in distal portion +----+---------------+---------+-----------+----------+-----------------------+   Left Technical Findings: Not visualized segments include Unable to visualize proximal to mid CIV, IVC.   Summary: RIGHT: - Findings consistent with acute deep vein thrombosis involving the right common femoral vein, SF junction, right femoral vein, right proximal profunda vein, right popliteal vein, right posterior tibial veins, right peroneal veins, and EIV, CIV. - A cystic structure is found in the popliteal fossa.  LEFT: - Findings consistent with acute deep vein thrombosis involving the EIV, distal CIV.  *See table(s) above for measurements and observations.    Preliminary    _______________________________________________________________________________________________________ Latest  Blood pressure (!) 137/90, pulse 86, temperature 100.1 F (37.8 C), temperature source Oral, resp. rate 16, height 5\' 4"  (1.626 m), weight 75.1 kg,  SpO2 95 %, unknown if currently breastfeeding.   Vitals  labs and radiology finding personally reviewed  Review of Systems:    Pertinent positives include:  right  lower extremity swelling and pain  Constitutional:  No weight loss, night sweats, Fevers, chills, fatigue, weight loss  HEENT:  No headaches, Difficulty swallowing,Tooth/dental problems,Sore throat,  No sneezing, itching, ear ache, nasal congestion, post nasal drip,  Cardio-vascular:  No chest pain, Orthopnea, PND, anasarca, dizziness, palpitations  GI:  No heartburn, indigestion, abdominal pain, nausea, vomiting, diarrhea, change in bowel habits, loss of appetite, melena, blood in stool, hematemesis Resp:  no shortness of breath at rest. No dyspnea on exertion, No excess mucus, no productive cough, No non-productive cough, No coughing up of blood.No change in color of mucus.No wheezing. Skin:  no rash or lesions. No jaundice GU:  no dysuria, change in color of urine, no urgency or frequency. No straining to urinate.  No flank pain.  Musculoskeletal:  No joint pain or no joint swelling. No decreased range of motion. No back pain.  Psych:  No change in mood or affect. No depression or anxiety. No memory loss.  Neuro: no localizing neurological complaints, no tingling, no weakness, no double vision, no gait abnormality, no slurred speech, no confusion  All systems reviewed and apart from HOPI all are negative _______________________________________________________________________________________________ Past Medical History:   Past Medical History:  Diagnosis Date   Diabetes mellitus without complication (HCC)    Fatty liver    Hyperlipidemia    Protein C deficiency (HCC)    Protein S deficiency (HCC)       Past Surgical History:  Procedure Laterality Date   ANKLE SURGERY Right    BIOPSY  11/16/2020   Procedure: BIOPSY;  Surgeon: Kathi DerBrahmbhatt, Parag, MD;  Location: [facility];  Service: Gastroenterology;;    CESAREAN SECTION     ESOPHAGOGASTRODUODENOSCOPY N/A 11/16/2020   Procedure: ESOPHAGOGASTRODUODENOSCOPY (EGD);  Surgeon: Kathi DerBrahmbhatt, Parag, MD;  [facility];  Service: Gastroenterology;  Laterality: N/A;   IR PARACENTESIS  11/12/2020   KNEE SURGERY Left     Social History:  Ambulatory   independently     reports that she has quit smoking. She has never used smokeless tobacco. She reports current alcohol use. She reports that she does not use drugs.     Family History:   Family  History  Problem Relation Age of Onset   Liver disease Neg Hx   Mother with diabetes father with hypertension ______________________________________________________________________________________________ Allergies: No Known Allergies   Prior to Admission medications   Medication Sig Start Date End Date Taking? Authorizing Provider  ezetimibe (ZETIA) 10 MG tablet Take 10 mg by mouth daily. 11/19/20   [provider]  feeding supplement (ENSURE ENLIVE / ENSURE PLUS) LIQD Take 237 mLs by mouth 3 (three) times daily between meals. 11/17/20   Pahwani, Michell Heinrich, MD  mirtazapine (REMERON) 15 MG tablet Take 7.5 mg by mouth at bedtime. 11/24/20   [provider]  Multiple Vitamin (MULTIVITAMIN WITH MINERALS) TABS tablet Take 1 tablet by mouth daily. 11/18/20   Pahwani, Michell Heinrich, MD  ondansetron (ZOFRAN-ODT) 4 MG disintegrating tablet Take 1 tablet (4 mg total) by mouth every 8 (eight) hours as needed for nausea or vomiting. 07/25/22   Antonietta Breach, PA-C  pantoprazole (PROTONIX) 40 MG tablet Take 1 tablet (40 mg total) by mouth 2 (two) times daily. 11/17/20   Pahwani, Michell Heinrich, MD  warfarin (COUMADIN) 1 MG tablet Take 1 tablet (1 mg total) by mouth daily. 12/11/20 01/10/21  Terrilee Croak, MD    ___________________________________________________________________________________________________ Physical Exam:    01/27/2023    5:22 PM 01/27/2023    2:27 PM 07/25/2022    6:00 AM  Vitals with BMI   Height 5\' 4"     Weight 165 lbs 9 oz    BMI 21.30    Systolic  865 784  Diastolic  90 77  Pulse  86 85     1. General:  in No  Acute distress    well   -appearing 2. Psychological: Alert and   Oriented 3. Head/ENT:   Mois Mucous Membranes                          Head Non traumatic, neck supple                         Poor Dentition 4. SKIN:  decreased Skin turgor,  Skin clean Dry and intact no rash 5. Heart: Regular rate and rhythm no  Murmur, no Rub or gallop 6. Lungs:  no wheezes or crackles   7. Abdomen: Soft,  non-tender, Non distended bowel sounds present 8. Lower extremities: no clubbing, cyanosis, edema right worse than left 9. Neurologically Grossly intact, moving all 4 extremities equally   10. MSK: Normal range of motion    Chart has been reviewed  ______________________________________________________________________________________________  Assessment/Plan 52 y.o. female with medical history significant of protein s and C deficiency, liver cirrhoses and DM2   Admitted for  massive right DVT,  Hyponatremia      Present on Admission:  DVT (deep venous thrombosis) ([place])  Protein S deficiency ([place])  Protein C deficiency ([place])  Hyponatremia    Seen by vascular surgery plan for OR on Monday  DVT (deep venous thrombosis) (HCC) Right leg large DVT  Off coumadin for dental procedure INR subtheraputic  Now on heparin drip  Appreciate vascular consult Bed rest  NPO post midnight      Protein S deficiency ([place]) On chronic Coumadin Patient states status post IVC filter  Protein C deficiency ([place]) On chronic Coumadin Patient states status post IVC filter  Type 2 diabetes mellitus without complication ([place]) Order sliding scale patient may need to restart p.o. meds at the time of discharge Appreciate diabetes  coordinator consult  History of cirrhosis Continue to monitor current appears to be well compensated.  Patient no longer drinks alcohol check  c-Met  Hyponatremia Obtain urine electrolytes partially correctable by given hyperglycemia    Other plan as per orders.  DVT prophylaxis:     heparin bolus via infusion 5,000 Units Start: 01/27/23 1730    Code Status:    Code Status: Prior FULL CODE   as per patient  I had personally discussed CODE STATUS with patient    Family Communication:   Family not at  Bedside    Disposition Plan:      To home once workup is complete and patient is stable   Following barriers for discharge:                                            Will need consultants to evaluate patient prior to discharge                      Diabetes care coordinator                                       Consults called: vasc surgery  Admission status:  ED Disposition     ED Disposition  [place]: [place] [[id number]]  Level of Care: Telemetry Cardiac [[id number]]  May place patient in observation at [facility] or [place] if equivalent level of care is available:: No  Covid Evaluation: Asymptomatic - no recent exposure (last 10 days) testing not required  Diagnosis: DVT (deep venous thrombosis) [facility]) [[id number]]  Admitting Physician: Toy Baker [[id number]]  Attending Physician: Toy Baker [[id number]]           Obs    Level of care     tele  For   24H     Lab Results  Component Value Date   [place] NEGATIVE 07/25/2022     Shelby Conner 01/27/2023, 7:09 PM    [facility]     after 2 AM please page floor coverage PA If 7AM-7PM, please contact the day team taking care of the patient using [email or web address]   Patient was evaluated in the context of the global COVID-19 pandemic, which necessitated consideration that the patient might be at risk for infection with the SARS-CoV-2 virus that causes COVID-19. Institutional protocols and algorithms that pertain to the evaluation of patients at risk for COVID-19 are in a state of  rapid change based on information released by regulatory bodies including the CDC and federal and state organizations. These policies and algorithms were followed during the patient's care.

## 2023-01-27 NOTE — Assessment & Plan Note (Signed)
On chronic Coumadin Patient states status post IVC filter 

## 2023-01-27 NOTE — Assessment & Plan Note (Signed)
Obtain urine electrolytes partially correctable by given hyperglycemia

## 2023-01-27 NOTE — ED Notes (Signed)
Patient transported to CT 

## 2023-01-28 DIAGNOSIS — Z86718 Personal history of other venous thrombosis and embolism: Secondary | ICD-10-CM | POA: Diagnosis not present

## 2023-01-28 DIAGNOSIS — Z87891 Personal history of nicotine dependence: Secondary | ICD-10-CM | POA: Diagnosis not present

## 2023-01-28 DIAGNOSIS — K766 Portal hypertension: Secondary | ICD-10-CM | POA: Diagnosis present

## 2023-01-28 DIAGNOSIS — E871 Hypo-osmolality and hyponatremia: Secondary | ICD-10-CM | POA: Diagnosis present

## 2023-01-28 DIAGNOSIS — I8510 Secondary esophageal varices without bleeding: Secondary | ICD-10-CM | POA: Diagnosis present

## 2023-01-28 DIAGNOSIS — Z86711 Personal history of pulmonary embolism: Secondary | ICD-10-CM | POA: Diagnosis not present

## 2023-01-28 DIAGNOSIS — Y638 Failure in dosage during other surgical and medical care: Secondary | ICD-10-CM | POA: Diagnosis present

## 2023-01-28 DIAGNOSIS — F419 Anxiety disorder, unspecified: Secondary | ICD-10-CM | POA: Diagnosis present

## 2023-01-28 DIAGNOSIS — K760 Fatty (change of) liver, not elsewhere classified: Secondary | ICD-10-CM | POA: Diagnosis present

## 2023-01-28 DIAGNOSIS — I70221 Atherosclerosis of native arteries of extremities with rest pain, right leg: Secondary | ICD-10-CM | POA: Diagnosis present

## 2023-01-28 DIAGNOSIS — K7031 Alcoholic cirrhosis of liver with ascites: Secondary | ICD-10-CM | POA: Diagnosis present

## 2023-01-28 DIAGNOSIS — K683 Retroperitoneal hematoma: Secondary | ICD-10-CM | POA: Diagnosis not present

## 2023-01-28 DIAGNOSIS — E785 Hyperlipidemia, unspecified: Secondary | ICD-10-CM | POA: Diagnosis present

## 2023-01-28 DIAGNOSIS — D62 Acute posthemorrhagic anemia: Secondary | ICD-10-CM | POA: Diagnosis not present

## 2023-01-28 DIAGNOSIS — I82491 Acute embolism and thrombosis of other specified deep vein of right lower extremity: Secondary | ICD-10-CM | POA: Diagnosis not present

## 2023-01-28 DIAGNOSIS — I82220 Acute embolism and thrombosis of inferior vena cava: Secondary | ICD-10-CM | POA: Diagnosis present

## 2023-01-28 DIAGNOSIS — D6859 Other primary thrombophilia: Secondary | ICD-10-CM | POA: Diagnosis present

## 2023-01-28 DIAGNOSIS — I82409 Acute embolism and thrombosis of unspecified deep veins of unspecified lower extremity: Secondary | ICD-10-CM | POA: Diagnosis present

## 2023-01-28 DIAGNOSIS — T82898A Other specified complication of vascular prosthetic devices, implants and grafts, initial encounter: Secondary | ICD-10-CM | POA: Diagnosis present

## 2023-01-28 DIAGNOSIS — Y831 Surgical operation with implant of artificial internal device as the cause of abnormal reaction of the patient, or of later complication, without mention of misadventure at the time of the procedure: Secondary | ICD-10-CM | POA: Diagnosis present

## 2023-01-28 DIAGNOSIS — D696 Thrombocytopenia, unspecified: Secondary | ICD-10-CM | POA: Diagnosis not present

## 2023-01-28 DIAGNOSIS — D6959 Other secondary thrombocytopenia: Secondary | ICD-10-CM | POA: Diagnosis present

## 2023-01-28 DIAGNOSIS — E876 Hypokalemia: Secondary | ICD-10-CM | POA: Diagnosis not present

## 2023-01-28 DIAGNOSIS — Z95828 Presence of other vascular implants and grafts: Secondary | ICD-10-CM | POA: Diagnosis not present

## 2023-01-28 DIAGNOSIS — I82423 Acute embolism and thrombosis of iliac vein, bilateral: Secondary | ICD-10-CM | POA: Diagnosis present

## 2023-01-28 DIAGNOSIS — E1165 Type 2 diabetes mellitus with hyperglycemia: Secondary | ICD-10-CM | POA: Diagnosis present

## 2023-01-28 DIAGNOSIS — I82411 Acute embolism and thrombosis of right femoral vein: Secondary | ICD-10-CM | POA: Diagnosis present

## 2023-01-28 DIAGNOSIS — E1151 Type 2 diabetes mellitus with diabetic peripheral angiopathy without gangrene: Secondary | ICD-10-CM | POA: Diagnosis present

## 2023-01-28 DIAGNOSIS — I743 Embolism and thrombosis of arteries of the lower extremities: Secondary | ICD-10-CM | POA: Diagnosis not present

## 2023-01-28 DIAGNOSIS — R161 Splenomegaly, not elsewhere classified: Secondary | ICD-10-CM | POA: Diagnosis present

## 2023-01-28 DIAGNOSIS — D649 Anemia, unspecified: Secondary | ICD-10-CM | POA: Diagnosis not present

## 2023-01-28 LAB — GLUCOSE, CAPILLARY
Glucose-Capillary: 144 mg/dL — ABNORMAL HIGH (ref 70–99)
Glucose-Capillary: 198 mg/dL — ABNORMAL HIGH (ref 70–99)
Glucose-Capillary: 275 mg/dL — ABNORMAL HIGH (ref 70–99)
Glucose-Capillary: 302 mg/dL — ABNORMAL HIGH (ref 70–99)
Glucose-Capillary: 305 mg/dL — ABNORMAL HIGH (ref 70–99)
Glucose-Capillary: 400 mg/dL — ABNORMAL HIGH (ref 70–99)

## 2023-01-28 LAB — COMPREHENSIVE METABOLIC PANEL
ALT: 18 U/L (ref 0–44)
AST: 20 U/L (ref 15–41)
Albumin: 3.1 g/dL — ABNORMAL LOW (ref 3.5–5.0)
Alkaline Phosphatase: 94 U/L (ref 38–126)
Anion gap: 11 (ref 5–15)
BUN: 12 mg/dL (ref 6–20)
CO2: 22 mmol/L (ref 22–32)
Calcium: 8.3 mg/dL — ABNORMAL LOW (ref 8.9–10.3)
Chloride: 95 mmol/L — ABNORMAL LOW (ref 98–111)
Creatinine, Ser: 0.8 mg/dL (ref 0.44–1.00)
GFR, Estimated: >60 mL/min (ref >60)
Glucose, Bld: 378 mg/dL — ABNORMAL HIGH (ref 70–99)
Potassium: 3.5 mmol/L (ref 3.5–5.1)
Sodium: 128 mmol/L — ABNORMAL LOW (ref 135–145)
Total Bilirubin: 1.2 mg/dL (ref 0.3–1.2)
Total Protein: 6.5 g/dL (ref 6.5–8.1)

## 2023-01-28 LAB — HIV ANTIBODY (ROUTINE TESTING W REFLEX): HIV Screen 4th Generation wRfx: NONREACTIVE

## 2023-01-28 LAB — CBC
HCT: 34 % — ABNORMAL LOW (ref 36.0–46.0)
Hemoglobin: 12.2 g/dL (ref 12.0–15.0)
MCH: 35.5 pg — ABNORMAL HIGH (ref 26.0–34.0)
MCHC: 35.9 g/dL (ref 30.0–36.0)
MCV: 98.8 fL (ref 80.0–100.0)
Platelets: 96 10*3/uL — ABNORMAL LOW (ref 150–400)
RBC: 3.44 MIL/uL — ABNORMAL LOW (ref 3.87–5.11)
RDW: 12.6 % (ref 11.5–15.5)
WBC: 8.8 10*3/uL (ref 4.0–10.5)
nRBC: 0 % (ref 0.0–0.2)

## 2023-01-28 LAB — CK: Total CK: 25 U/L — ABNORMAL LOW (ref 38–234)

## 2023-01-28 LAB — OSMOLALITY: Osmolality: 294 mOsm/kg (ref 275–295)

## 2023-01-28 LAB — T4, FREE: Free T4: 0.84 ng/dL (ref 0.61–1.12)

## 2023-01-28 LAB — TSH: TSH: 12.07 u[IU]/mL — ABNORMAL HIGH (ref 0.350–4.500)

## 2023-01-28 LAB — HEPARIN LEVEL (UNFRACTIONATED)
Heparin Unfractionated: 0.28 IU/mL — ABNORMAL LOW (ref 0.30–0.70)
Heparin Unfractionated: 0.41 IU/mL (ref 0.30–0.70)
Heparin Unfractionated: 0.45 IU/mL (ref 0.30–0.70)
Heparin Unfractionated: 0.47 IU/mL (ref 0.30–0.70)

## 2023-01-28 LAB — PHOSPHORUS: Phosphorus: 3.7 mg/dL (ref 2.5–4.6)

## 2023-01-28 LAB — PREALBUMIN: Prealbumin: 10 mg/dL — ABNORMAL LOW (ref 18–38)

## 2023-01-28 LAB — HEMOGLOBIN A1C
Hgb A1c MFr Bld: 11.2 % — ABNORMAL HIGH (ref 4.8–5.6)
Mean Plasma Glucose: 274.74 mg/dL

## 2023-01-28 LAB — MAGNESIUM: Magnesium: 1.5 mg/dL — ABNORMAL LOW (ref 1.7–2.4)

## 2023-01-28 MED ORDER — DIPHENHYDRAMINE HCL 50 MG/ML IJ SOLN
12.5000 mg | Freq: Four times a day (QID) | INTRAMUSCULAR | Status: DC | PRN
  Administered 2023-01-28 – 2023-02-03 (×16): 12.5 mg via INTRAVENOUS
  Filled 2023-01-28 (×16): qty 1

## 2023-01-28 MED ORDER — ASPIRIN 81 MG PO TBEC
81.0000 mg | DELAYED_RELEASE_TABLET | Freq: Every day | ORAL | Status: DC
  Administered 2023-01-28 – 2023-02-08 (×12): 81 mg via ORAL
  Filled 2023-01-28 (×12): qty 1

## 2023-01-28 MED ORDER — INSULIN ASPART 100 UNIT/ML IJ SOLN
0.0000 [IU] | Freq: Every day | INTRAMUSCULAR | Status: DC
  Administered 2023-01-28: 4 [IU] via SUBCUTANEOUS

## 2023-01-28 MED ORDER — INSULIN ASPART 100 UNIT/ML IJ SOLN
0.0000 [IU] | Freq: Three times a day (TID) | INTRAMUSCULAR | Status: DC
  Administered 2023-01-28: 3 [IU] via SUBCUTANEOUS
  Administered 2023-01-28: 8 [IU] via SUBCUTANEOUS
  Administered 2023-01-28: 2 [IU] via SUBCUTANEOUS
  Administered 2023-01-29: 5 [IU] via SUBCUTANEOUS

## 2023-01-28 MED ORDER — LEVOTHYROXINE SODIUM 25 MCG PO TABS: 25.0000 ug | ORAL_TABLET | Freq: Every day | ORAL | Status: DC

## 2023-01-28 MED ORDER — SODIUM CHLORIDE 0.9 % IV SOLN: INTRAVENOUS | Status: AC

## 2023-01-28 MED ORDER — DIPHENHYDRAMINE HCL 25 MG PO CAPS: 25.0000 mg | ORAL_CAPSULE | Freq: Four times a day (QID) | ORAL | Status: DC | PRN

## 2023-01-28 MED ORDER — MAGNESIUM SULFATE 2 GM/50ML IV SOLN
2.0000 g | Freq: Once | INTRAVENOUS | Status: AC
  Administered 2023-01-28: 2 g via INTRAVENOUS
  Filled 2023-01-28: qty 50

## 2023-01-28 NOTE — Progress Notes (Signed)
PROGRESS NOTE    Shelby Conner  PIR:[id number] DOB: 1971-12-19 DOA: 01/27/2023 PCP: Laverle Hobby, NP   Brief Narrative:    Shelby Conner is a 52 y.o. female with medical history significant of protein s and C deficiency, liver cirrhoses and DM2 who presented with right lower extremity edema that began 3 days ago.  Vascular planning for percutaneous mechanical thrombectomy on 2/5.   Assessment & Plan:   Principal Problem:   DVT (deep venous thrombosis) (HCC) Active Problems:   Protein S deficiency (HCC)   Protein C deficiency (HCC)   Type 2 diabetes mellitus without complication (HCC)   History of cirrhosis   Hyponatremia  Assessment and Plan:  DVT (deep venous thrombosis) (HCC) Right leg large DVT  Off coumadin for dental procedure INR subtheraputic  Now on heparin drip  Appreciate vascular consult with plans for thrombectomy in a.m. Bed rest  NPO post midnight   Protein S deficiency ([place]) On chronic Coumadin Patient states status post IVC filter   Protein C deficiency ([place]) On chronic Coumadin Patient states status post IVC filter   Type 2 diabetes mellitus without complication ([place]) Order sliding scale patient may need to restart p.o. meds at the time of discharge Continue SSI Appreciate diabetes coordinator consult A1c 11.2%   History of cirrhosis Continue to monitor current appears to be well compensated.  Patient no longer drinks alcohol check c-Met   Hyponatremia Check serum and urine osmolarity Started on normal saline and follow in a.m. TSH is 12, free T4 0.84  Hypomagnesemia Repleted, check in a.m.    DVT prophylaxis:Heparin drip Code Status: Full Family Communication: None at bedside Disposition Plan:  Status is: Observation The patient will require care spanning > 2 midnights and should be moved to inpatient because: Need for ongoing IV medication   Consultants:  Vascular Surgery  Procedures:  None  Antimicrobials:   None   Subjective: Patient seen and evaluated today with no new acute complaints or concerns. No acute concerns or events noted overnight.  She continues to have some pain to her right lower extremity.  Objective: Vitals:   01/27/23 2300 01/28/23 0455 01/28/23 0800 01/28/23 1117  BP: 103/64 110/74 111/65 109/76  Pulse: (!) 46 (!) 54 61 (!) 55  Resp: 12 16 15 16   Temp: 97.8 F (36.6 C) 97.8 F (36.6 C) (!) 97.5 F (36.4 C) 97.7 F (36.5 C)  TempSrc: Oral Oral Oral Oral  SpO2: 94% 95% 97% 97%  Weight:      Height:        Intake/Output Summary (Last 24 hours) at 01/28/2023 1121 Last data filed at 01/28/2023 0601 Gross per 24 hour  Intake 976.66 ml  Output 500 ml  Net 476.66 ml   Filed Weights   01/27/23 1722  Weight: 75.1 kg    Examination:  General exam: Appears calm and comfortable  Respiratory system: Clear to auscultation. Respiratory effort normal. Cardiovascular system: S1 & S2 heard, RRR.  Gastrointestinal system: Abdomen is soft Central nervous system: Alert and awake Extremities: No edema Skin: No significant lesions noted Psychiatry: Flat affect.    Data Reviewed: I have personally reviewed following labs and imaging studies  CBC: Recent Labs  Lab 01/27/23 1447 01/28/23 0002  WBC 10.2 8.8  NEUTROABS 6.6  --   HGB 14.0 12.2  HCT 39.4 34.0*  MCV 101.0* 98.8  PLT PLATELET CLUMPS NOTED ON SMEAR, UNABLE TO ESTIMATE 96*   Basic Metabolic Panel: Recent Labs  Lab 01/27/23 1447 01/28/23 0002  NA 127* 128*  K 4.9 3.5  CL 91* 95*  CO2 24 22  GLUCOSE 374* 378*  BUN 12 12  CREATININE 0.80 0.80  CALCIUM 9.5 8.3*  MG  --  1.5*  PHOS  --  3.7   GFR: Estimated Creatinine Clearance: 83.5 mL/min (by C-G formula based on SCr of 0.8 mg/dL). Liver Function Tests: Recent Labs  Lab 01/28/23 0002  AST 20  ALT 18  ALKPHOS 94  BILITOT 1.2  PROT 6.5  ALBUMIN 3.1*   No results for input(s): "LIPASE", "AMYLASE" in the last 168 hours. No results for  input(s): "AMMONIA" in the last 168 hours. Coagulation Profile: Recent Labs  Lab 01/27/23 1630  INR 1.4*   Cardiac Enzymes: Recent Labs  Lab 01/28/23 0002  CKTOTAL 25*   BNP (last 3 results) No results for input(s): "PROBNP" in the last 8760 hours. HbA1C: Recent Labs    01/28/23 0002  HGBA1C 11.2*   CBG: Recent Labs  Lab 01/28/23 0020 01/28/23 0453 01/28/23 0912  GLUCAP 400* 305* 275*   Lipid Profile: No results for input(s): "CHOL", "HDL", "LDLCALC", "TRIG", "CHOLHDL", "LDLDIRECT" in the last 72 hours. Thyroid Function Tests: Recent Labs    01/28/23 0002 01/28/23 0714  TSH 12.070*  --   FREET4  --  0.84   Anemia Panel: No results for input(s): "VITAMINB12", "FOLATE", "FERRITIN", "TIBC", "IRON", "RETICCTPCT" in the last 72 hours. Sepsis Labs: No results for input(s): "PROCALCITON", "LATICACIDVEN" in the last 168 hours.  No results found for this or any previous visit (from the past 240 hour(s)).       Radiology Studies: CT ABDOMEN PELVIS W CONTRAST  Result Date: 01/27/2023 CLINICAL DATA:  Proximal DVT EXAM: CT ABDOMEN AND PELVIS WITH CONTRAST TECHNIQUE: Multidetector CT imaging of the abdomen and pelvis was performed using the standard protocol following bolus administration of intravenous contrast. RADIATION DOSE REDUCTION: This exam was performed according to the departmental dose-optimization program which includes automated exposure control, adjustment of the mA and/or kV according to patient size and/or use of iterative reconstruction technique. CONTRAST:  79mL OMNIPAQUE IOHEXOL 350 MG/ML SOLN COMPARISON:  11/14/2020 FINDINGS: Lower chest: Mild dependent changes in the lung bases. Prominent esophageal varices. Hepatobiliary: Cirrhotic changes in the liver with enlarged lateral segment left lobe and caudate lobe and with nodular contour to the liver. There is somewhat poorly defined low-attenuation change in the anterior liver. The previous study demonstrated  severe fatty infiltration throughout the liver which has improved since that time. It is likely that the low-attenuation changes reflect some residual heterogeneous fatty infiltration. No specific focal nodules are demonstrated. Cholelithiasis with small stones in the gallbladder. No gallbladder wall thickening or stranding. No bile duct dilatation. The portal veins are patent with prominent portal venous collaterals including periumbilical vein varices and splenic vein varices. Pancreas: Unremarkable. No pancreatic ductal dilatation or surrounding inflammatory changes. Spleen: Spleen size is upper limits of normal.  No focal lesions. Adrenals/Urinary Tract: Adrenal glands are unremarkable. Kidneys are normal, without renal calculi, focal lesion, or hydronephrosis. Bladder is unremarkable. Stomach/Bowel: Stomach, small bowel, and colon are not abnormally distended. No wall thickening or inflammatory changes. Appendix is normal. Vascular/Lymphatic: Diffuse aortic calcification. No aneurysm. An inferior vena caval filter is present. Stent in the left common iliac vein. No flow is demonstrated in the visualized inferior vena cava below the stent or in the iliac and external iliac veins with mild stranding around the external iliac veins. This likely indicates venous thrombosis. Prominent gonadal vein varices  are present suggesting this may indicate a chronic process. Reproductive: Uterus and bilateral adnexa are unremarkable. Other: Infiltration in the subcutaneous fat around the right hip and right iliopsoas region likely representing edema although infection could also have this appearance. No loculated collections. No free air or free fluid in the abdomen. Musculoskeletal: No acute or significant osseous findings. IMPRESSION: 1. Hepatic cirrhosis with portal venous hypertension including multiple upper abdominal and esophageal varices as well as mild splenic enlargement. 2. Probable heterogeneous fatty infiltration  of the liver. Fatty infiltration has improved since the previous study. 3. Cholelithiasis without CT evidence of acute cholecystitis. 4. Inferior vena caval filter is in place with probable venous thrombosis below this level and extending into the pelvic veins. Infiltration in the right iliopsoas region and soft tissues around the right hip likely indicate edema although infection could also have this appearance. 5. Aortic atherosclerosis. Electronically Signed   By: Burman Nieves M.D.   On: 01/27/2023 18:43   VAS [place] LOWER EXTREMITY VENOUS (DVT) (7a-7p)  Result Date: 01/27/2023  Lower Venous DVT Study Patient Name:  Shelby Conner  Date of Exam:   01/27/2023 Medical Rec #: [id number]                Accession #:    [id number] Date of Birth: 1971-02-14                 Patient Gender: F Patient Age:   79 years Exam Location:  [facility] Procedure:      VAS [place] LOWER EXTREMITY VENOUS (DVT) Referring Phys: Claudette Stapler --------------------------------------------------------------------------------  Indications: Pain, and Swelling. Patient has IVC filter per CT done 11/14/20  Risk Factors: Personal history of DVT. Patient with Cirrhosis Anticoagulation: Coumadin. Patient taken off Warfarin 5 days for dental procedure. Comparison Study: No prior study on file Performing Technologist: Sherren Kerns RVS  Examination Guidelines: A complete evaluation includes B-mode imaging, spectral Doppler, color Doppler, and power Doppler as needed of all accessible portions of each vessel. Bilateral testing is considered an integral part of a complete examination. Limited examinations for reoccurring indications may be performed as noted. The reflux portion of the exam is performed with the patient in reverse Trendelenburg.  +---------+---------------+---------+-----------+----------+-------------------+ RIGHT    CompressibilityPhasicitySpontaneityPropertiesThrombus Aging       +---------+---------------+---------+-----------+----------+-------------------+ CFV      None           No       No                   Acute               +---------+---------------+---------+-----------+----------+-------------------+ SFJ      None                                         Acute               +---------+---------------+---------+-----------+----------+-------------------+ FV Prox  None           No       No                   Acute               +---------+---------------+---------+-----------+----------+-------------------+ FV Mid   None           No       No  Acute               +---------+---------------+---------+-----------+----------+-------------------+ FV DistalNone           No       No                   Acute               +---------+---------------+---------+-----------+----------+-------------------+ PFV      Partial        No       Yes                  Acute               +---------+---------------+---------+-----------+----------+-------------------+ POP      None           No       No                   Acute               +---------+---------------+---------+-----------+----------+-------------------+ PTV      None                                         Acute prox to mid                                                         portion             +---------+---------------+---------+-----------+----------+-------------------+ PERO     None                                         Acute prox to mid                                                         portion             +---------+---------------+---------+-----------+----------+-------------------+ EIV      None           No       No                   Acute               +---------+---------------+---------+-----------+----------+-------------------+ CIV                     No       No                   acute                +---------+---------------+---------+-----------+----------+-------------------+   Right Technical Findings: Not visualized segments include Unable to visualize IVC.  +----+---------------+---------+-----------+----------+-----------------------+ LEFTCompressibilityPhasicitySpontaneityPropertiesThrombus Aging          +----+---------------+---------+-----------+----------+-----------------------+ CFV Full           Yes      Yes                                          +----+---------------+---------+-----------+----------+-----------------------+  EIV                                              Acute                   +----+---------------+---------+-----------+----------+-----------------------+ CIV                                              Acute in distal portion +----+---------------+---------+-----------+----------+-----------------------+   Left Technical Findings: Not visualized segments include Unable to visualize proximal to mid CIV, IVC.   Summary: RIGHT: - Findings consistent with acute deep vein thrombosis involving the right common femoral vein, SF junction, right femoral vein, right proximal profunda vein, right popliteal vein, right posterior tibial veins, right peroneal veins, and EIV, CIV. - A cystic structure is found in the popliteal fossa.  LEFT: - Findings consistent with acute deep vein thrombosis involving the EIV, distal CIV.  *See table(s) above for measurements and observations.    Preliminary         Scheduled Meds:  aspirin EC  81 mg Oral Daily   insulin aspart  0-15 Units Subcutaneous TID WC   insulin aspart  0-5 Units Subcutaneous QHS   Continuous Infusions:  sodium chloride     heparin 1,500 Units/hr (01/28/23 0832)     LOS: 0 days    Time spent: 35 minutes    Anniemae Haberkorn Darleen Crocker, DO [facility]  If 7PM-7AM, please contact night-coverage [email or web address] 01/28/2023, 11:21 AM

## 2023-01-28 NOTE — Progress Notes (Signed)
  Daily Progress Note  Dx: Iliocaval DVT extending into the right leg.   Subjective: Feels better this morning, less right lower extremity tightness, left leg remains asymptomatic.  Objective: Vitals:   01/28/23 0455 01/28/23 0800  BP: 110/74 111/65  Pulse: (!) 54 61  Resp: 16 15  Temp: 97.8 F (36.6 C) (!) 97.5 F (36.4 C)  SpO2: 95% 97%    Physical Examination Palpable pulses in the feet bilaterally Less edema in the right lower extremity Sensorimotor intact Nonlabored breathing Regular rate, normal rhythm   ASSESSMENT/PLAN:  Patient is a 52 year old female with known protein C, protein S deficiency, recently taken off of her warfarin for a dental procedure.  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  The thrombus propagates down both iliac veins, and continues into the right lower extremity.  I had a long conversation with her regarding the above.  Being that she is symptomatic, she would benefit from percutaneous mechanical thrombectomy.  The filter has been in place since 2005, and intimal hyperplasia has likely occurred.  I am unsure as to whether this is the nidus that she has previously undergone dental work without anticoagulation and had no issues.     Currently anticoagulated with heparin -please continue Can get up out of bed should the patient need to use the restroom, otherwise would remain bedrest Plan for percutaneous mechanical thrombectomy tomorrow, possible filter exchange. After discussing the risk and benefits of the above, Eliz elected to proceed.    Cassandria Santee MD [facility] [phone] 01/28/2023  9:54 AM

## 2023-01-28 NOTE — Progress Notes (Signed)
ANTICOAGULATION CONSULT NOTE - Follow-up Note  Pharmacy Consult for Heparin Indication: DVT  No Known Allergies  Patient Measurements: Height: 5\' 4"  (162.6 cm) Weight: 75.1 kg (165 lb 9.1 oz) IBW/kg (Calculated) : 54.7 Heparin Dosing Weight: 70kg  Vital Signs: Temp: 97.8 F (36.6 C) (02/04 1540) Temp Source: Oral (02/04 1714) BP: 116/86 (02/04 1714) Pulse Rate: 56 (02/04 1714)  Labs: Recent Labs    01/27/23 1447 01/27/23 1630 01/28/23 0002 01/28/23 0002 01/28/23 0714 01/28/23 1516 01/28/23 2053  HGB 14.0  --  12.2  --   --   --   --   HCT 39.4  --  34.0*  --   --   --   --   PLT PLATELET CLUMPS NOTED ON SMEAR, UNABLE TO ESTIMATE  --  96*  --   --   --   --   APTT  --  28  --   --   --   --   --   LABPROT  --  17.3*  --   --   --   --   --   INR  --  1.4*  --   --   --   --   --   HEPARINUNFRC  --   --  0.41   < > 0.28* 0.47 0.45  CREATININE 0.80  --  0.80  --   --   --   --   CKTOTAL  --   --  25*  --   --   --   --    < > = values in this interval not displayed.     Estimated Creatinine Clearance: 83.5 mL/min (by C-G formula based on SCr of 0.8 mg/dL).   Medical History: Past Medical History:  Diagnosis Date   Diabetes mellitus without complication ([place])    Fatty liver    Hyperlipidemia    Protein C deficiency (HCC)    Protein S deficiency (HCC)     Medications:  Medications Prior to Admission  Medication Sig Dispense Refill Last Dose   ibuprofen (ADVIL) 200 MG tablet Take 200-400 mg by mouth 2 (two) times daily as needed for headache, mild pain or moderate pain.   Past Week   potassium chloride (KLOR-CON) 10 MEQ tablet Take 10 mEq by mouth daily.   01/27/2023   Tetrahydrozoline HCl (VISINE OP) Place 1 drop into both eyes 2 (two) times daily as needed (dry eyes, itchy eyes).   Past Month   warfarin (COUMADIN) 6 MG tablet Take 6 mg by mouth daily.   01/27/2023 at 0630   feeding supplement (ENSURE ENLIVE / ENSURE PLUS) LIQD Take 237 mLs by mouth 3 (three)  times daily between meals. (Patient not taking: Reported on 01/27/2023) 237 mL 12 Not Taking   Multiple Vitamin (MULTIVITAMIN WITH MINERALS) TABS tablet Take 1 tablet by mouth daily. (Patient not taking: Reported on 01/27/2023) 30 tablet 0 Not Taking   ondansetron (ZOFRAN-ODT) 4 MG disintegrating tablet Take 1 tablet (4 mg total) by mouth every 8 (eight) hours as needed for nausea or vomiting. (Patient not taking: Reported on 01/27/2023) 10 tablet 0 Not Taking   pantoprazole (PROTONIX) 40 MG tablet Take 1 tablet (40 mg total) by mouth 2 (two) times daily. (Patient not taking: Reported on 01/27/2023) 60 tablet 0 Not Taking   warfarin (COUMADIN) 1 MG tablet Take 1 tablet (1 mg total) by mouth daily. (Patient not taking: Reported on 01/27/2023) 30 tablet 0 Not Taking   Scheduled:  aspirin EC  81 mg Oral Daily   insulin aspart  0-15 Units Subcutaneous TID WC   insulin aspart  0-5 Units Subcutaneous QHS   Infusions:   sodium chloride 75 mL/hr at 01/28/23 2127   heparin 1,500 Units/hr (01/28/23 1908)   PRN: acetaminophen **OR** acetaminophen, diphenhydrAMINE, HYDROcodone-acetaminophen  Assessment: 22 yof with a history of Protein C and S deficiency on warfarin, NASH vs alcoholic cirrhosis. Patient reports being s/p IVC filter. Patient is presenting with leg pain and swelling. Patient is on warfarin prior to arrival but has stopped x 5 days for gum surgery. Per patient takes 6mg  daily prior to that. Heparin per pharmacy consult placed for DVT.   Vascular is planning for mechanical thrombectomy 2/5 with possible filter change.  Heparin level remains therapeutic (0.45). Hg wnl, plt 96. No bleed issues reported.  Goal of Therapy:  Heparin level 0.3-0.7 units/ml Monitor platelets by anticoagulation protocol: Yes   Plan:  Continue heparin infusion at 1500 units/hr Monitor daily heparin level and CBC, s/sx bleeding F/u plans post-vascular procedure 2/5, watch plt trend closely   Arturo Morton, PharmD,  BCPS Please check AMION for all [place] contact numbers Clinical Pharmacist 01/28/2023 9:54 PM

## 2023-01-28 NOTE — Progress Notes (Signed)
ANTICOAGULATION CONSULT NOTE - Initial Consult  Pharmacy Consult for Heparin Indication: DVT  No Known Allergies  Patient Measurements: Height: 5\' 4"  (162.6 cm) Weight: 75.1 kg (165 lb 9.1 oz) IBW/kg (Calculated) : 54.7 Heparin Dosing Weight: 70kg  Vital Signs: Temp: 97.5 F (36.4 C) (02/04 0800) Temp Source: Oral (02/04 0800) BP: 111/65 (02/04 0800) Pulse Rate: 61 (02/04 0800)  Labs: Recent Labs    01/27/23 1447 01/27/23 1630 01/28/23 0002 01/28/23 0714  HGB 14.0  --  12.2  --   HCT 39.4  --  34.0*  --   PLT PLATELET CLUMPS NOTED ON SMEAR, UNABLE TO ESTIMATE  --  96*  --   APTT  --  28  --   --   LABPROT  --  17.3*  --   --   INR  --  1.4*  --   --   HEPARINUNFRC  --   --  0.41 0.28*  CREATININE 0.80  --  0.80  --   CKTOTAL  --   --  25*  --      Estimated Creatinine Clearance: 83.5 mL/min (by C-G formula based on SCr of 0.8 mg/dL).   Medical History: Past Medical History:  Diagnosis Date   Diabetes mellitus without complication ([place])    Fatty liver    Hyperlipidemia    Protein C deficiency (HCC)    Protein S deficiency (HCC)     Medications:  Medications Prior to Admission  Medication Sig Dispense Refill Last Dose   ibuprofen (ADVIL) 200 MG tablet Take 200-400 mg by mouth 2 (two) times daily as needed for headache, mild pain or moderate pain.   Past Week   potassium chloride (KLOR-CON) 10 MEQ tablet Take 10 mEq by mouth daily.   01/27/2023   Tetrahydrozoline HCl (VISINE OP) Place 1 drop into both eyes 2 (two) times daily as needed (dry eyes, itchy eyes).   Past Month   warfarin (COUMADIN) 6 MG tablet Take 6 mg by mouth daily.   01/27/2023 at 0630   feeding supplement (ENSURE ENLIVE / ENSURE PLUS) LIQD Take 237 mLs by mouth 3 (three) times daily between meals. (Patient not taking: Reported on 01/27/2023) 237 mL 12 Not Taking   Multiple Vitamin (MULTIVITAMIN WITH MINERALS) TABS tablet Take 1 tablet by mouth daily. (Patient not taking: Reported on 01/27/2023) 30  tablet 0 Not Taking   ondansetron (ZOFRAN-ODT) 4 MG disintegrating tablet Take 1 tablet (4 mg total) by mouth every 8 (eight) hours as needed for nausea or vomiting. (Patient not taking: Reported on 01/27/2023) 10 tablet 0 Not Taking   pantoprazole (PROTONIX) 40 MG tablet Take 1 tablet (40 mg total) by mouth 2 (two) times daily. (Patient not taking: Reported on 01/27/2023) 60 tablet 0 Not Taking   warfarin (COUMADIN) 1 MG tablet Take 1 tablet (1 mg total) by mouth daily. 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 Patient reports being s/p IVC filter. Patient is presenting with leg pain and swelling. Patient is on warfarin prior to arrival but has stopped x 5 days for gum surgery. Per patient takes 6mg  daily prior to that. Heparin per pharmacy consult placed for DVT.  Heparin level 0.28 on drip rate 1250 units/hr subtherapeutic. Last heparin level was in therapeutic range. Hgb 12.2 and plt 96. No s/sx bleeding or issues with infusion noted per  RN.     Goal of Therapy:  Heparin level 0.3-0.7 units/ml Monitor platelets by anticoagulation protocol: Yes   Plan:  Increase heparin infusion to 1500 units/hr  Will f/u heparin level in 6 hours Monitor daily heparin level and CBC Continue to monitor H&H     Gena Fray, PharmD PGY1 Pharmacy Resident   01/28/2023 8:41 AM

## 2023-01-28 NOTE — Progress Notes (Signed)
ANTICOAGULATION CONSULT NOTE - Follow-up Note  Pharmacy Consult for Heparin Indication: DVT  No Known Allergies  Patient Measurements: Height: 5\' 4"  (162.6 cm) Weight: 75.1 kg (165 lb 9.1 oz) IBW/kg (Calculated) : 54.7 Heparin Dosing Weight: 70kg  Vital Signs: Temp: 97.8 F (36.6 C) (02/04 1540) Temp Source: Oral (02/04 1540) BP: 129/75 (02/04 1540) Pulse Rate: 60 (02/04 1540)  Labs: Recent Labs    01/27/23 1447 01/27/23 1630 01/28/23 0002 01/28/23 0714 01/28/23 1516  HGB 14.0  --  12.2  --   --   HCT 39.4  --  34.0*  --   --   PLT PLATELET CLUMPS NOTED ON SMEAR, UNABLE TO ESTIMATE  --  96*  --   --   APTT  --  28  --   --   --   LABPROT  --  17.3*  --   --   --   INR  --  1.4*  --   --   --   HEPARINUNFRC  --   --  0.41 0.28* 0.47  CREATININE 0.80  --  0.80  --   --   CKTOTAL  --   --  25*  --   --     Estimated Creatinine Clearance: 83.5 mL/min (by C-G formula based on SCr of 0.8 mg/dL).   Medical History: Past Medical History:  Diagnosis Date   Diabetes mellitus without complication ([place])    Fatty liver    Hyperlipidemia    Protein C deficiency (HCC)    Protein S deficiency (HCC)     Medications:  Medications Prior to Admission  Medication Sig Dispense Refill Last Dose   ibuprofen (ADVIL) 200 MG tablet Take 200-400 mg by mouth 2 (two) times daily as needed for headache, mild pain or moderate pain.   Past Week   potassium chloride (KLOR-CON) 10 MEQ tablet Take 10 mEq by mouth daily.   01/27/2023   Tetrahydrozoline HCl (VISINE OP) Place 1 drop into both eyes 2 (two) times daily as needed (dry eyes, itchy eyes).   Past Month   warfarin (COUMADIN) 6 MG tablet Take 6 mg by mouth daily.   01/27/2023 at 0630   feeding supplement (ENSURE ENLIVE / ENSURE PLUS) LIQD Take 237 mLs by mouth 3 (three) times daily between meals. (Patient not taking: Reported on 01/27/2023) 237 mL 12 Not Taking   Multiple Vitamin (MULTIVITAMIN WITH MINERALS) TABS tablet Take 1 tablet by mouth  daily. (Patient not taking: Reported on 01/27/2023) 30 tablet 0 Not Taking   ondansetron (ZOFRAN-ODT) 4 MG disintegrating tablet Take 1 tablet (4 mg total) by mouth every 8 (eight) hours as needed for nausea or vomiting. (Patient not taking: Reported on 01/27/2023) 10 tablet 0 Not Taking   pantoprazole (PROTONIX) 40 MG tablet Take 1 tablet (40 mg total) by mouth 2 (two) times daily. (Patient not taking: Reported on 01/27/2023) 60 tablet 0 Not Taking   warfarin (COUMADIN) 1 MG tablet Take 1 tablet (1 mg total) by mouth daily. (Patient not taking: Reported on 01/27/2023) 30 tablet 0 Not Taking   Scheduled:   aspirin EC  81 mg Oral Daily   insulin aspart  0-15 Units Subcutaneous TID WC   insulin aspart  0-5 Units Subcutaneous QHS   Infusions:   sodium chloride 75 mL/hr at 01/28/23 1205   heparin 1,500 Units/hr (01/28/23 0832)   PRN: acetaminophen **OR** acetaminophen, diphenhydrAMINE, HYDROcodone-acetaminophen  Assessment: 61 yof with a history of Protein C and S deficiency on  warfarin, NASH vs alcoholic cirrhosis. Patient reports being s/p IVC filter. Patient is presenting with leg pain and swelling. Patient is on warfarin prior to arrival but has stopped x 5 days for gum surgery. Per patient takes 6mg  daily prior to that. Heparin per pharmacy consult placed for DVT.   Vascular is planning for mechanical thrombectomy tomorrow with possible filter change.  Heparin level this morning low and heparin infusion increased to 1500 units/hr. Repeat level this evening is therapeutic (0.47).  No issues with infusion or bleeding per RN.  CBC stable  Goal of Therapy:  Heparin level 0.3-0.7 units/ml Monitor platelets by anticoagulation protocol: Yes   Plan:  Continue heparin infusion at 1500 units/hr Check anti-Xa level at 2100 and daily while on heparin Continue to monitor H&H and platelets  Lorelei Pont, PharmD, BCPS 01/28/2023 3:56 PM ED Clinical Pharmacist -  [phone]

## 2023-01-28 NOTE — Progress Notes (Signed)
ANTICOAGULATION CONSULT NOTE - Initial Consult  Pharmacy Consult for Heparin Indication: DVT  No Known Allergies  Patient Measurements: Height: 5\' 4"  (162.6 cm) Weight: 75.1 kg (165 lb 9.1 oz) IBW/kg (Calculated) : 54.7 Heparin Dosing Weight: 70kg  Vital Signs: Temp: 97.8 F (36.6 C) (02/03 2300) Temp Source: Oral (02/03 2300) BP: 103/64 (02/03 2300) Pulse Rate: 46 (02/03 2300)  Labs: Recent Labs    01/27/23 1447 01/27/23 1630 01/28/23 0002  HGB 14.0  --   --   HCT 39.4  --   --   PLT PLATELET CLUMPS NOTED ON SMEAR, UNABLE TO ESTIMATE  --   --   APTT  --  28  --   LABPROT  --  17.3*  --   INR  --  1.4*  --   HEPARINUNFRC  --   --  0.41  CREATININE 0.80  --   --      Estimated Creatinine Clearance: 83.5 mL/min (by C-G formula based on SCr of 0.8 mg/dL).   Medical History: Past Medical History:  Diagnosis Date   Diabetes mellitus without complication ([place])    Fatty liver    Hyperlipidemia    Protein C deficiency (HCC)    Protein S deficiency (HCC)     Medications:  Medications Prior to Admission  Medication Sig Dispense Refill Last Dose   ibuprofen (ADVIL) 200 MG tablet Take 200-400 mg by mouth 2 (two) times daily as needed for headache, mild pain or moderate pain.   Past Week   potassium chloride (KLOR-CON) 10 MEQ tablet Take 10 mEq by mouth daily.   01/27/2023   Tetrahydrozoline HCl (VISINE OP) Place 1 drop into both eyes 2 (two) times daily as needed (dry eyes, itchy eyes).   Past Month   warfarin (COUMADIN) 6 MG tablet Take 6 mg by mouth daily.   01/27/2023 at 0630   feeding supplement (ENSURE ENLIVE / ENSURE PLUS) LIQD Take 237 mLs by mouth 3 (three) times daily between meals. (Patient not taking: Reported on 01/27/2023) 237 mL 12 Not Taking   Multiple Vitamin (MULTIVITAMIN WITH MINERALS) TABS tablet Take 1 tablet by mouth daily. (Patient not taking: Reported on 01/27/2023) 30 tablet 0 Not Taking   ondansetron (ZOFRAN-ODT) 4 MG disintegrating tablet Take 1 tablet  (4 mg total) by mouth every 8 (eight) hours as needed for nausea or vomiting. (Patient not taking: Reported on 01/27/2023) 10 tablet 0 Not Taking   pantoprazole (PROTONIX) 40 MG tablet Take 1 tablet (40 mg total) by mouth 2 (two) times daily. (Patient not taking: Reported on 01/27/2023) 60 tablet 0 Not Taking   warfarin (COUMADIN) 1 MG tablet Take 1 tablet (1 mg total) by mouth daily. (Patient not taking: Reported on 01/27/2023) 30 tablet 0 Not Taking    Scheduled:  Infusions:  PRN:   Assessment: 66 yof with a history of Protein C and S deficiency on warfarin, NASH vs alcoholic cirrhosis. Patient reports being s/p IVC filter. Patient is presenting with leg pain and swelling. Heparin per pharmacy consult placed for DVT.  Patient is on warfarin prior to arrival but has stopped x 5 days for gum surgery. Per patient takes 6mg  daily prior to that.  Hgb 14; plt CLUMPING ON SMEAR PT / INR 17.3 / 1.4  Heparin level came back therapeutic tonight. We will check a confirm level in AM.   Goal of Therapy:  Heparin level 0.3-0.7 units/ml Monitor platelets by anticoagulation protocol: Yes   Plan:  Cont heparin infusion at  1250 units/hr Check anti-Xa level in AM and daily while on heparin Continue to monitor H&H and platelets  Onnie Boer, PharmD, BCIDP, AAHIVP, CPP Infectious Disease Pharmacist 01/28/2023 12:42 AM

## 2023-01-29 ENCOUNTER — Encounter (HOSPITAL_COMMUNITY): Payer: Self-pay | Admitting: Internal Medicine

## 2023-01-29 ENCOUNTER — Inpatient Hospital Stay (HOSPITAL_COMMUNITY): Payer: BC Managed Care – PPO

## 2023-01-29 ENCOUNTER — Inpatient Hospital Stay (HOSPITAL_COMMUNITY): Payer: BC Managed Care – PPO | Admitting: Anesthesiology

## 2023-01-29 ENCOUNTER — Encounter (HOSPITAL_COMMUNITY)
Admission: EM | Disposition: A | Discharge status: Home / Self Care | Payer: Self-pay | Source: Home / Self Care | Attending: Internal Medicine

## 2023-01-29 ENCOUNTER — Other Ambulatory Visit: Payer: Self-pay

## 2023-01-29 DIAGNOSIS — I82491 Acute embolism and thrombosis of other specified deep vein of right lower extremity: Secondary | ICD-10-CM | POA: Diagnosis not present

## 2023-01-29 DIAGNOSIS — I743 Embolism and thrombosis of arteries of the lower extremities: Secondary | ICD-10-CM

## 2023-01-29 LAB — CBC
HCT: 35.8 % — ABNORMAL LOW (ref 36.0–46.0)
HCT: 39.6 % (ref 36.0–46.0)
HCT: 36.2 % (ref 36.0–46.0)
Hemoglobin: 12.5 g/dL (ref 12.0–15.0)
Hemoglobin: 13.1 g/dL (ref 12.0–15.0)
Hemoglobin: 12.7 g/dL (ref 12.0–15.0)
MCH: 34.6 pg — ABNORMAL HIGH (ref 26.0–34.0)
MCH: 34.5 pg — ABNORMAL HIGH (ref 26.0–34.0)
MCH: 35.2 pg — ABNORMAL HIGH (ref 26.0–34.0)
MCHC: 34.9 g/dL (ref 30.0–36.0)
MCHC: 33.1 g/dL (ref 30.0–36.0)
MCHC: 35.1 g/dL (ref 30.0–36.0)
MCV: 99.2 fL (ref 80.0–100.0)
MCV: 104.2 fL — ABNORMAL HIGH (ref 80.0–100.0)
MCV: 100.3 fL — ABNORMAL HIGH (ref 80.0–100.0)
Platelets: 107 10*3/uL — ABNORMAL LOW (ref 150–400)
Platelets: 103 10*3/uL — ABNORMAL LOW (ref 150–400)
Platelets: 88 10*3/uL — ABNORMAL LOW (ref 150–400)
RBC: 3.61 MIL/uL — ABNORMAL LOW (ref 3.87–5.11)
RBC: 3.8 MIL/uL — ABNORMAL LOW (ref 3.87–5.11)
RBC: 3.61 MIL/uL — ABNORMAL LOW (ref 3.87–5.11)
RDW: 12.5 % (ref 11.5–15.5)
RDW: 12.6 % (ref 11.5–15.5)
RDW: 12.5 % (ref 11.5–15.5)
WBC: 7.1 10*3/uL (ref 4.0–10.5)
WBC: 8.4 10*3/uL (ref 4.0–10.5)
WBC: 9.2 10*3/uL (ref 4.0–10.5)
nRBC: 0 % (ref 0.0–0.2)
nRBC: 0 % (ref 0.0–0.2)
nRBC: 0 % (ref 0.0–0.2)

## 2023-01-29 LAB — FIBRINOGEN
Fibrinogen: 447 mg/dL (ref 210–475)
Fibrinogen: 352 mg/dL (ref 210–475)

## 2023-01-29 LAB — GLUCOSE, CAPILLARY
Glucose-Capillary: 139 mg/dL — ABNORMAL HIGH (ref 70–99)
Glucose-Capillary: 149 mg/dL — ABNORMAL HIGH (ref 70–99)
Glucose-Capillary: 166 mg/dL — ABNORMAL HIGH (ref 70–99)
Glucose-Capillary: 167 mg/dL — ABNORMAL HIGH (ref 70–99)
Glucose-Capillary: 200 mg/dL — ABNORMAL HIGH (ref 70–99)
Glucose-Capillary: 225 mg/dL — ABNORMAL HIGH (ref 70–99)
Glucose-Capillary: 256 mg/dL — ABNORMAL HIGH (ref 70–99)

## 2023-01-29 LAB — HEPARIN LEVEL (UNFRACTIONATED)
Heparin Unfractionated: 0.44 IU/mL (ref 0.30–0.70)
Heparin Unfractionated: <0.1 IU/mL — ABNORMAL LOW (ref 0.30–0.70)

## 2023-01-29 LAB — BASIC METABOLIC PANEL
Anion gap: 8 (ref 5–15)
BUN: 12 mg/dL (ref 6–20)
CO2: 24 mmol/L (ref 22–32)
Calcium: 8.3 mg/dL — ABNORMAL LOW (ref 8.9–10.3)
Chloride: 100 mmol/L (ref 98–111)
Creatinine, Ser: 0.64 mg/dL (ref 0.44–1.00)
GFR, Estimated: >60 mL/min (ref >60)
Glucose, Bld: 241 mg/dL — ABNORMAL HIGH (ref 70–99)
Potassium: 3.5 mmol/L (ref 3.5–5.1)
Sodium: 132 mmol/L — ABNORMAL LOW (ref 135–145)

## 2023-01-29 LAB — OSMOLALITY: Osmolality: 284 mOsm/kg (ref 275–295)

## 2023-01-29 LAB — SURGICAL PCR SCREEN
MRSA, PCR: NEGATIVE
Staphylococcus aureus: POSITIVE — AB

## 2023-01-29 LAB — MAGNESIUM: Magnesium: 1.7 mg/dL (ref 1.7–2.4)

## 2023-01-29 SURGERY — THROMBECTOMY, PERCUTANEOUS, TRANSLUMINAL
Anesthesia: Monitor Anesthesia Care | Laterality: Bilateral

## 2023-01-29 MED ORDER — PROPOFOL 10 MG/ML IV BOLUS
INTRAVENOUS | Status: AC
  Filled 2023-01-29: qty 20

## 2023-01-29 MED ORDER — CHLORHEXIDINE GLUCONATE 0.12 % MT SOLN
15.0000 mL | Freq: Once | OROMUCOSAL | Status: AC
  Administered 2023-01-29: 15 mL via OROMUCOSAL
  Filled 2023-01-29: qty 15

## 2023-01-29 MED ORDER — MIDAZOLAM HCL 2 MG/2ML IJ SOLN: 1.0000 mg | INTRAMUSCULAR | Status: DC | PRN

## 2023-01-29 MED ORDER — FENTANYL CITRATE (PF) 250 MCG/5ML IJ SOLN
INTRAMUSCULAR | Status: AC
  Filled 2023-01-29: qty 5

## 2023-01-29 MED ORDER — IODIXANOL 320 MG/ML IV SOLN
INTRAVENOUS | Status: DC | PRN
  Administered 2023-01-29: 50 mL via INTRA_ARTERIAL

## 2023-01-29 MED ORDER — HYDROMORPHONE HCL 1 MG/ML IJ SOLN
0.5000 mg | INTRAMUSCULAR | Status: AC | PRN
  Administered 2023-01-29 – 2023-01-31 (×15): 0.5 mg via INTRAVENOUS
  Filled 2023-01-29 (×16): qty 0.5

## 2023-01-29 MED ORDER — FENTANYL CITRATE (PF) 100 MCG/2ML IJ SOLN
INTRAMUSCULAR | Status: AC
  Filled 2023-01-29: qty 2

## 2023-01-29 MED ORDER — HEPARIN 6000 UNIT IRRIGATION SOLUTION
Status: DC | PRN
  Administered 2023-01-29: 1

## 2023-01-29 MED ORDER — SODIUM CHLORIDE 0.9 % IV SOLN
0.5000 mg/h | INTRAVENOUS | Status: AC
  Administered 2023-01-29: 0.5 mg/h
  Filled 2023-01-29 (×2): qty 10

## 2023-01-29 MED ORDER — LIDOCAINE HCL (PF) 1 % IJ SOLN
INTRAMUSCULAR | Status: AC
  Filled 2023-01-29: qty 30

## 2023-01-29 MED ORDER — MORPHINE SULFATE (PF) 4 MG/ML IV SOLN: 5.0000 mg | INTRAVENOUS | Status: DC | PRN

## 2023-01-29 MED ORDER — SODIUM CHLORIDE 0.9 % IV SOLN
0.5000 mg/h | INTRAVENOUS | Status: DC
  Filled 2023-01-29 (×2): qty 10

## 2023-01-29 MED ORDER — ALTEPLASE 1 MG/ML SYRINGE FOR VASCULAR PROCEDURE
10.0000 mg | Freq: Once | INTRAMUSCULAR | Status: AC
  Administered 2023-01-29: 3 mg via INTRA_ARTERIAL
  Filled 2023-01-29: qty 10

## 2023-01-29 MED ORDER — MIDAZOLAM HCL 2 MG/2ML IJ SOLN
INTRAMUSCULAR | Status: AC
  Filled 2023-01-29: qty 2

## 2023-01-29 MED ORDER — LABETALOL HCL 5 MG/ML IV SOLN: 10.0000 mg | INTRAVENOUS | Status: DC | PRN

## 2023-01-29 MED ORDER — INSULIN ASPART 100 UNIT/ML IJ SOLN: 0.0000 [IU] | Freq: Every day | INTRAMUSCULAR | Status: DC

## 2023-01-29 MED ORDER — INSULIN ASPART 100 UNIT/ML IJ SOLN: 0.0000 [IU] | Freq: Three times a day (TID) | INTRAMUSCULAR | Status: DC

## 2023-01-29 MED ORDER — DIPHENHYDRAMINE HCL 50 MG/ML IJ SOLN
INTRAMUSCULAR | Status: AC
  Filled 2023-01-29: qty 1

## 2023-01-29 MED ORDER — SODIUM CHLORIDE 0.9 % IV SOLN
0.5000 mg/h | INTRAVENOUS | Status: DC
  Administered 2023-01-30 (×2): 0.5 mg/h
  Filled 2023-01-29 (×4): qty 10

## 2023-01-29 MED ORDER — INSULIN ASPART 100 UNIT/ML IJ SOLN: 3.0000 [IU] | Freq: Three times a day (TID) | INTRAMUSCULAR | Status: DC

## 2023-01-29 MED ORDER — INSULIN ASPART 100 UNIT/ML IJ SOLN
0.0000 [IU] | INTRAMUSCULAR | Status: DC
  Administered 2023-01-29: 4 [IU] via SUBCUTANEOUS
  Administered 2023-01-29 (×2): 3 [IU] via SUBCUTANEOUS
  Administered 2023-01-30: 4 [IU] via SUBCUTANEOUS
  Administered 2023-01-30: 3 [IU] via SUBCUTANEOUS
  Administered 2023-01-30: 7 [IU] via SUBCUTANEOUS
  Administered 2023-01-30 – 2023-01-31 (×2): 4 [IU] via SUBCUTANEOUS
  Administered 2023-01-31: 7 [IU] via SUBCUTANEOUS
  Administered 2023-01-31 – 2023-02-01 (×5): 4 [IU] via SUBCUTANEOUS
  Administered 2023-02-01: 14 [IU] via SUBCUTANEOUS
  Administered 2023-02-01: 4 [IU] via SUBCUTANEOUS
  Administered 2023-02-01: 3 [IU] via SUBCUTANEOUS
  Administered 2023-02-01 – 2023-02-02 (×2): 4 [IU] via SUBCUTANEOUS

## 2023-01-29 MED ORDER — LACTATED RINGERS IV SOLN: INTRAVENOUS | Status: DC

## 2023-01-29 MED ORDER — CHLORHEXIDINE GLUCONATE CLOTH 2 % EX PADS
6.0000 | MEDICATED_PAD | Freq: Every day | CUTANEOUS | Status: DC
  Administered 2023-01-29 – 2023-02-02 (×5): 6 via TOPICAL

## 2023-01-29 MED ORDER — HYDRALAZINE HCL 20 MG/ML IJ SOLN: 5.0000 mg | INTRAMUSCULAR | Status: DC | PRN

## 2023-01-29 MED ORDER — FENTANYL CITRATE (PF) 100 MCG/2ML IJ SOLN
25.0000 ug | INTRAMUSCULAR | Status: DC | PRN
  Administered 2023-01-29 (×3): 50 ug via INTRAVENOUS

## 2023-01-29 MED ORDER — CEFAZOLIN SODIUM-DEXTROSE 1-4 GM/50ML-% IV SOLN
1.0000 g | Freq: Three times a day (TID) | INTRAVENOUS | Status: AC
  Administered 2023-01-29 – 2023-01-30 (×2): 1 g via INTRAVENOUS
  Filled 2023-01-29 (×2): qty 50

## 2023-01-29 MED ORDER — SODIUM CHLORIDE 0.9% FLUSH: 3.0000 mL | INTRAVENOUS | Status: DC | PRN

## 2023-01-29 MED ORDER — LIDOCAINE HCL (PF) 1 % IJ SOLN
INTRAMUSCULAR | Status: DC | PRN
  Administered 2023-01-29: 6 mL

## 2023-01-29 MED ORDER — MUPIROCIN 2 % EX OINT
1.0000 | TOPICAL_OINTMENT | Freq: Two times a day (BID) | CUTANEOUS | Status: AC
  Administered 2023-01-29 – 2023-02-02 (×9): 1 via NASAL
  Filled 2023-01-29 (×2): qty 22

## 2023-01-29 MED ORDER — SODIUM CHLORIDE 0.9 % IV SOLN: 250.0000 mL | INTRAVENOUS | Status: DC | PRN

## 2023-01-29 MED ORDER — INSULIN ASPART 100 UNIT/ML IJ SOLN
INTRAMUSCULAR | Status: AC
  Filled 2023-01-29: qty 1

## 2023-01-29 MED ORDER — CEFAZOLIN SODIUM-DEXTROSE 2-4 GM/100ML-% IV SOLN
2.0000 g | Freq: Once | INTRAVENOUS | Status: AC
  Administered 2023-01-29: 2 g via INTRAVENOUS

## 2023-01-29 MED ORDER — ONDANSETRON HCL 4 MG/2ML IJ SOLN: 4.0000 mg | Freq: Four times a day (QID) | INTRAMUSCULAR | Status: DC | PRN

## 2023-01-29 MED ORDER — HEPARIN 6000 UNIT IRRIGATION SOLUTION
Status: AC
  Filled 2023-01-29: qty 500

## 2023-01-29 MED ORDER — HEPARIN (PORCINE) 25000 UT/250ML-% IV SOLN: 700.0000 [IU]/h | INTRAVENOUS | Status: DC

## 2023-01-29 MED ORDER — SODIUM CHLORIDE 0.9% FLUSH
3.0000 mL | Freq: Two times a day (BID) | INTRAVENOUS | Status: DC
  Administered 2023-01-29: 3 mL via INTRAVENOUS

## 2023-01-29 MED ORDER — SODIUM CHLORIDE 0.9 % IV SOLN: INTRAVENOUS | Status: AC | PRN

## 2023-01-29 MED ORDER — INSULIN ASPART 100 UNIT/ML IJ SOLN
0.0000 [IU] | INTRAMUSCULAR | Status: DC | PRN
  Administered 2023-01-29: 4 [IU] via SUBCUTANEOUS

## 2023-01-29 MED ORDER — HEPARIN (PORCINE) 25000 UT/250ML-% IV SOLN
400.0000 [IU]/h | INTRAVENOUS | Status: DC
  Administered 2023-01-29: 400 [IU]/h via INTRAVENOUS
  Filled 2023-01-29 (×2): qty 250

## 2023-01-29 MED ORDER — PROMETHAZINE HCL 25 MG/ML IJ SOLN: 6.2500 mg | INTRAMUSCULAR | Status: DC | PRN

## 2023-01-29 MED ORDER — HEPARIN (PORCINE) 25000 UT/250ML-% IV SOLN
400.0000 [IU]/h | INTRAVENOUS | Status: DC
  Administered 2023-01-29 (×2): 400 [IU]/h via INTRAVENOUS

## 2023-01-29 MED ORDER — CEFAZOLIN SODIUM 1 G IJ SOLR
1.0000 g | Freq: Three times a day (TID) | INTRAMUSCULAR | Status: DC
  Filled 2023-01-29: qty 10

## 2023-01-29 MED ORDER — ACETAMINOPHEN 500 MG PO TABS
1000.0000 mg | ORAL_TABLET | Freq: Once | ORAL | Status: AC
  Administered 2023-01-29: 1000 mg via ORAL
  Filled 2023-01-29: qty 2

## 2023-01-29 MED ORDER — MIDAZOLAM HCL 2 MG/2ML IJ SOLN
INTRAMUSCULAR | Status: DC | PRN
  Administered 2023-01-29 (×2): 2 mg via INTRAVENOUS

## 2023-01-29 MED ORDER — CHLORHEXIDINE GLUCONATE CLOTH 2 % EX PADS
6.0000 | MEDICATED_PAD | Freq: Every day | CUTANEOUS | Status: DC
  Administered 2023-01-30 – 2023-02-02 (×4): 6 via TOPICAL

## 2023-01-29 MED ORDER — FENTANYL CITRATE (PF) 250 MCG/5ML IJ SOLN
INTRAMUSCULAR | Status: DC | PRN
  Administered 2023-01-29 (×3): 50 ug via INTRAVENOUS

## 2023-01-29 MED ORDER — CEFAZOLIN SODIUM-DEXTROSE 2-4 GM/100ML-% IV SOLN
INTRAVENOUS | Status: AC
  Filled 2023-01-29: qty 100

## 2023-01-29 MED ORDER — ORAL CARE MOUTH RINSE: 15.0000 mL | Freq: Once | OROMUCOSAL | Status: AC

## 2023-01-29 SURGICAL SUPPLY — 33 items
BAG COUNTER SPONGE SURGICOUNT (BAG) IMPLANT
CANISTER SUCT 3000ML PPV (MISCELLANEOUS) IMPLANT
CATH ANGIO 5F BER2 100CM (CATHETERS) IMPLANT
CATH BEACON 5 .035 65 KMP TIP (CATHETERS) IMPLANT
CATH INFUS 135CMX50CM (CATHETERS) IMPLANT
CATH VISIONS PV .035 IVUS (CATHETERS) IMPLANT
CHLORAPREP W/TINT 26 (MISCELLANEOUS) ×1 IMPLANT
COVER MAYO STAND STRL (DRAPES) ×1 IMPLANT
DRSG TEGADERM 4X4.75 (GAUZE/BANDAGES/DRESSINGS) IMPLANT
GAUZE 4X4 16PLY ~~LOC~~+RFID DBL (SPONGE) IMPLANT
GAUZE SPONGE 4X4 12PLY STRL (GAUZE/BANDAGES/DRESSINGS) IMPLANT
GLIDEWIRE ADV .035X260CM (WIRE) IMPLANT
GLOVE BIOGEL PI IND STRL 6.5 (GLOVE) IMPLANT
GLOVE INDICATOR 8.0 STRL GRN (GLOVE) ×1 IMPLANT
GOWN STRL REUS W/ TWL LRG LVL3 (GOWN DISPOSABLE) ×2 IMPLANT
GOWN STRL REUS W/TWL 2XL LVL3 (GOWN DISPOSABLE) ×2 IMPLANT
GOWN STRL REUS W/TWL LRG LVL3 (GOWN DISPOSABLE) ×2
KIT BASIN OR (CUSTOM PROCEDURE TRAY) ×1 IMPLANT
NDL HYPO 25GX1X1/2 BEV (NEEDLE) IMPLANT
NEEDLE HYPO 25GX1X1/2 BEV (NEEDLE) IMPLANT
NS IRRIG 1000ML POUR BTL (IV SOLUTION) ×1 IMPLANT
PACK ENDO MINOR (CUSTOM PROCEDURE TRAY) ×1 IMPLANT
PAD ARMBOARD 7.5X6 YLW CONV (MISCELLANEOUS) ×2 IMPLANT
SET MICROPUNCTURE 5F STIFF (MISCELLANEOUS) ×1 IMPLANT
SHEATH PINNACLE 5F 10CM (SHEATH) ×1 IMPLANT
SHEATH PINNACLE 6F 10CM (SHEATH) IMPLANT
SHEATH PINNACLE 8F 10CM (SHEATH) IMPLANT
STOPCOCK 4 WAY LG BORE MALE ST (IV SETS) IMPLANT
SYR CONTROL 10ML LL (SYRINGE) IMPLANT
TOWEL GREEN STERILE (TOWEL DISPOSABLE) ×1 IMPLANT
TOWEL GREEN STERILE FF (TOWEL DISPOSABLE) IMPLANT
WATER STERILE IRR 1000ML POUR (IV SOLUTION) IMPLANT
WIRE BENTSON .035X145CM (WIRE) ×1 IMPLANT

## 2023-01-29 NOTE — Op Note (Addendum)
    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 Initiation of Pharmacomechanical thrombolysis from bilateral popliteal veins using alteplase  SURGEON: Broadus John  ASSIST: None  ANESTHESIA: Moderate  EBL: 82ml  INDICATIONS:    Shelby Conner is a 52 y.o. female with known protein C, protein S deficiency, recently taken off of her warfarin for a dental procedure.  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  The thrombus propagates down both iliac veins, and continues into the right lower extremity.   I had a long conversation with her regarding the above.  Being that she is symptomatic, she would benefit from percutaneous mechanical thrombectomy.  The filter has been in place since 2005, and intimal hyperplasia has likely occurred.  I am unsure as to whether this is the nidus that she has previously undergone dental work without anticoagulation and had no issues.   Plan for bilateral popliteal vein access in the prone position and lytic cathter placement today.   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  Monitored anesthesia was induced and the patient was prepped and draped in standard fashion.  A timeout was performed.  The case began with ultrasound  insonation of bilateral popliteal veins.  An ultrasound-guided micropuncture needle was used to access the veins bilaterally.  Wires were run from the popliteal fossa into the inferior vena cava.  The micropuncture sheaths were exchanged for 6 French sheaths.  Bilateral lower extremity angiography and cavogram followed see results above. I elected to place bilateral lower extremity lytic catheters from the popliteal fossa extending to the inferior vena cava. Lines were primed with 1.5 mg of alteplase each.  The sheaths were sutured into place, and dressings placed.  The sheaths will run at 400 units/hr, and catheters will run at 0.5 mg of alteplase bilaterally  Shelby Burows, MD [facility] DATE OF DICTATION:   01/29/2023

## 2023-01-29 NOTE — Anesthesia Postprocedure Evaluation (Signed)
Anesthesia Post Note  Patient: Kaeya McDonald-Fingal  Procedure(s) Performed: PERCUTANEOUS THROMBECTOMY (Bilateral)     Patient location during evaluation: PACU Anesthesia Type: MAC Level of consciousness: awake and alert Pain management: pain level controlled Vital Signs Assessment: post-procedure vital signs reviewed and stable Respiratory status: spontaneous breathing, nonlabored ventilation and respiratory function stable Cardiovascular status: stable and blood pressure returned to baseline Postop Assessment: no apparent nausea or vomiting Anesthetic complications: no   No notable events documented.  Last Vitals:  Vitals:   01/29/23 1430 01/29/23 1445  BP: (!) 163/92   Pulse: 71   Resp: 13 15  Temp:    SpO2: 99%     Last Pain:  Vitals:   01/29/23 1345  TempSrc:   PainSc: 10-Worst pain ever                 Santa Lighter

## 2023-01-29 NOTE — Progress Notes (Signed)
ANTICOAGULATION CONSULT NOTE - Follow-up Note  Pharmacy Consult for Heparin Indication: DVT   No Known Allergies  Patient Measurements: Height: 5\' 4"  (162.6 cm) Weight: 75.1 kg (165 lb 9.1 oz) IBW/kg (Calculated) : 54.7 Heparin Dosing Weight: 70kg  Vital Signs: Temp: 99.1 F (37.3 C) (02/05 2000) Temp Source: Oral (02/05 2000) BP: 129/76 (02/05 2000) Pulse Rate: 71 (02/05 2000)  Labs: Recent Labs    01/27/23 1447 01/27/23 1630 01/28/23 0002 01/28/23 0714 01/28/23 2053 01/29/23 0101 01/29/23 1642 01/29/23 2004  HGB 14.0  --  12.2  --   --  12.5 13.1 12.7  HCT 39.4  --  34.0*  --   --  35.8* 39.6 36.2  PLT PLATELET CLUMPS NOTED ON SMEAR, UNABLE TO ESTIMATE  --  96*  --   --  107* 103* 88*  APTT  --  28  --   --   --   --   --   --   LABPROT  --  17.3*  --   --   --   --   --   --   INR  --  1.4*  --   --   --   --   --   --   HEPARINUNFRC  --   --  0.41   < > 0.45 0.44  --  <0.10*  CREATININE 0.80  --  0.80  --   --  0.64  --   --   CKTOTAL  --   --  25*  --   --   --   --   --    < > = values in this interval not displayed.     Estimated Creatinine Clearance: 83.5 mL/min (by C-G formula based on SCr of 0.64 mg/dL).   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 Patient reports being s/p IVC filter. Patient is presenting with leg pain and swelling. Patient is on warfarin prior to arrival but has stopped x 5 days for gum surgery. Per patient takes 6mg  daily prior to that. Heparin per pharmacy consult placed for DVT.   She is s/p thrombectomy using alteplase - continues at 0.5 mg/hr in each catheter. Heparin drip was resumed at 400 units/hr in each catheter also so total of 800 units/hr.   Heparin level undetectable on 800 units total heparin. No issues with line or bleeding reported per RN.  Goal of Therapy:  Heparin level 0.2-0.5 units/ml Monitor platelets by anticoagulation protocol: Yes   Plan:   Increase heparin infusion to 600 units/hr in each catheter (1200 units/hr total) 6 hr heparin level F/u plan for alteplase  Thank you for involving pharmacy in this patient's care.  Sherlon Handing, PharmD, BCPS Please see amion for complete clinical pharmacist phone list 01/29/2023 9:18 PM

## 2023-01-29 NOTE — Transfer of Care (Signed)
Immediate Anesthesia Transfer of Care Note  Patient: Shelby Conner  Procedure(s) Performed: PERCUTANEOUS THROMBECTOMY (Bilateral)  Patient Location: PACU  Anesthesia Type:MAC  Level of Consciousness: awake, alert , and oriented  Airway & Oxygen Therapy: Patient Spontanous Breathing and Patient connected to nasal cannula oxygen  Post-op Assessment: Report given to RN, Post -op Vital signs reviewed and stable, and Patient moving all extremities X 4  Post vital signs: Reviewed and stable  Last Vitals:  Vitals Value Taken Time  BP 143/94 01/29/23 1337  Temp    Pulse 73 01/29/23 1343  Resp 15 01/29/23 1343  SpO2 99 % 01/29/23 1343  Vitals shown include unvalidated device data.  Last Pain:  Vitals:   01/29/23 1033  TempSrc: Oral  PainSc: 8          Complications: No notable events documented.

## 2023-01-29 NOTE — Inpatient Diabetes Management (Signed)
Inpatient Diabetes Program Recommendations  AACE/ADA: New Consensus Statement on Inpatient Glycemic Control (2015)  Target Ranges:  Prepandial:   less than 140 mg/dL      Peak postprandial:   less than 180 mg/dL (1-2 hours)      Critically ill patients:  140 - 180 mg/dL   Lab Results  Component Value Date   GLUCAP 166 (H) 01/29/2023   HGBA1C 11.2 (H) 01/28/2023    Review of Glycemic Control  Diabetes history: DM2 Outpatient Diabetes medications: No DM meds listed Current orders for Inpatient glycemic control: Novolog 0-20 units q 4 hrs.  Inpatient Diabetes Program Recommendations:   Please consider: -Semglee 8 units now and qd (0.1 units/kg x 75.1 kg = 8 units) -Decrease Novolog correction to 0-15 units q 4 hrs.  Thank you, Nani Gasser. Jaelyn Cloninger, RN, MSN, CDE  Diabetes Coordinator Inpatient Glycemic Control Team Team Pager [phone] (8am-5pm) 01/29/2023 2:46 PM

## 2023-01-29 NOTE — Progress Notes (Signed)
PROGRESS NOTE    Shelby Conner  QQI:[id number] DOB: May 08, 1971 DOA: 01/27/2023 PCP: Laverle Hobby, NP   Brief Narrative:    Shelby Conner is a 52 y.o. female with medical history significant of protein s and C deficiency, liver cirrhoses and DM2 who presented with right lower extremity edema that began 3 days ago.  Vascular planning for percutaneous mechanical thrombectomy on 2/5 with possible filter exchange.   Assessment & Plan:   Principal Problem:   DVT (deep venous thrombosis) (HCC) Active Problems:   Protein S deficiency (HCC)   Protein C deficiency (HCC)   Type 2 diabetes mellitus without complication (HCC)   History of cirrhosis   Hyponatremia  Assessment and Plan:  DVT (deep venous thrombosis) (HCC) Right leg large DVT  Off coumadin for dental procedure INR subtheraputic  Now on heparin drip  Appreciate vascular consult with plans for thrombectomy today Bed rest  NPO post midnight   Protein S deficiency ([place]) On chronic Coumadin Patient states status post IVC filter   Protein C deficiency (HCC) On chronic Coumadin Patient states status post IVC filter   Type 2 diabetes mellitus without complication (HCC) with hyperglycemia Order sliding scale patient may need to restart p.o. meds at the time of discharge Continue SSI now every 4 hours Appreciate diabetes coordinator consult and will need to restart oral agents at home and continue monitoring A1c 11.2%   History of cirrhosis Continue to monitor current appears to be well compensated.  Patient no longer drinks alcohol check c-Met   Hyponatremia-improved Continue to monitor in a.m. TSH is 12, free T4 0.84  Hypomagnesemia Repleted, continue to monitor in a.m.    DVT prophylaxis:Heparin drip Code Status: Full Family Communication: None at bedside Disposition Plan:  Status is: Inpatient Remains inpatient appropriate because: Continues to require IV infusions and inpatient  procedure  Consultants:  Vascular Surgery  Procedures:  None  Antimicrobials:  None   Subjective: Patient seen and evaluated today with no new acute complaints or concerns. No acute concerns or events noted overnight.  She continues to have some pain to her right lower extremity.  Objective: Vitals:   01/28/23 1117 01/28/23 1540 01/28/23 1714 01/29/23 0750  BP: 109/76 129/75 116/86 120/71  Pulse: (!) 55 60 (!) 56 66  Resp: 16 15 16 19   Temp: 97.7 F (36.5 C) 97.8 F (36.6 C)  98.2 F (36.8 C)  TempSrc: Oral Oral Oral Oral  SpO2: 97% 96% 96% 100%  Weight:      Height:        Intake/Output Summary (Last 24 hours) at 01/29/2023 0951 Last data filed at 01/29/2023 0543 Gross per 24 hour  Intake 1618.96 ml  Output --  Net 1618.96 ml   Filed Weights   01/27/23 1722  Weight: 75.1 kg    Examination:  General exam: Appears calm and comfortable  Respiratory system: Clear to auscultation. Respiratory effort normal. Cardiovascular system: S1 & S2 heard, RRR.  Gastrointestinal system: Abdomen is soft Central nervous system: Alert and awake Extremities: No edema Skin: No significant lesions noted Psychiatry: Flat affect.    Data Reviewed: I have personally reviewed following labs and imaging studies  CBC: Recent Labs  Lab 01/27/23 1447 01/28/23 0002 01/29/23 0101  WBC 10.2 8.8 7.1  NEUTROABS 6.6  --   --   HGB 14.0 12.2 12.5  HCT 39.4 34.0* 35.8*  MCV 101.0* 98.8 99.2  PLT PLATELET CLUMPS NOTED ON SMEAR, UNABLE TO ESTIMATE 96* 941*   Basic Metabolic  Panel: Recent Labs  Lab 01/27/23 1447 01/28/23 0002 01/29/23 0101  NA 127* 128* 132*  K 4.9 3.5 3.5  CL 91* 95* 100  CO2 24 22 24   GLUCOSE 374* 378* 241*  BUN 12 12 12   CREATININE 0.80 0.80 0.64  CALCIUM 9.5 8.3* 8.3*  MG  --  1.5* 1.7  PHOS  --  3.7  --    GFR: Estimated Creatinine Clearance: 83.5 mL/min (by C-G formula based on SCr of 0.64 mg/dL). Liver Function Tests: Recent Labs  Lab  01/28/23 0002  AST 20  ALT 18  ALKPHOS 94  BILITOT 1.2  PROT 6.5  ALBUMIN 3.1*   No results for input(s): "LIPASE", "AMYLASE" in the last 168 hours. No results for input(s): "AMMONIA" in the last 168 hours. Coagulation Profile: Recent Labs  Lab 01/27/23 1630  INR 1.4*   Cardiac Enzymes: Recent Labs  Lab 01/28/23 0002  CKTOTAL 25*   BNP (last 3 results) No results for input(s): "PROBNP" in the last 8760 hours. HbA1C: Recent Labs    01/28/23 0002  HGBA1C 11.2*   CBG: Recent Labs  Lab 01/28/23 0912 01/28/23 1229 01/28/23 1711 01/28/23 2143 01/29/23 0625  GLUCAP 275* 198* 144* 302* 225*   Lipid Profile: No results for input(s): "CHOL", "HDL", "LDLCALC", "TRIG", "CHOLHDL", "LDLDIRECT" in the last 72 hours. Thyroid Function Tests: Recent Labs    01/28/23 0002 01/28/23 0714  TSH 12.070*  --   FREET4  --  0.84   Anemia Panel: No results for input(s): "VITAMINB12", "FOLATE", "FERRITIN", "TIBC", "IRON", "RETICCTPCT" in the last 72 hours. Sepsis Labs: No results for input(s): "PROCALCITON", "LATICACIDVEN" in the last 168 hours.  No results found for this or any previous visit (from the past 240 hour(s)).       Radiology Studies: CT ABDOMEN PELVIS W CONTRAST  Result Date: 01/27/2023 CLINICAL DATA:  Proximal DVT EXAM: CT ABDOMEN AND PELVIS WITH CONTRAST TECHNIQUE: Multidetector CT imaging of the abdomen and pelvis was performed using the standard protocol following bolus administration of intravenous contrast. RADIATION DOSE REDUCTION: This exam was performed according to the departmental dose-optimization program which includes automated exposure control, adjustment of the mA and/or kV according to patient size and/or use of iterative reconstruction technique. CONTRAST:  28mL OMNIPAQUE IOHEXOL 350 MG/ML SOLN COMPARISON:  11/14/2020 FINDINGS: Lower chest: Mild dependent changes in the lung bases. Prominent esophageal varices. Hepatobiliary: Cirrhotic changes in the  liver with enlarged lateral segment left lobe and caudate lobe and with nodular contour to the liver. There is somewhat poorly defined low-attenuation change in the anterior liver. The previous study demonstrated severe fatty infiltration throughout the liver which has improved since that time. It is likely that the low-attenuation changes reflect some residual heterogeneous fatty infiltration. No specific focal nodules are demonstrated. Cholelithiasis with small stones in the gallbladder. No gallbladder wall thickening or stranding. No bile duct dilatation. The portal veins are patent with prominent portal venous collaterals including periumbilical vein varices and splenic vein varices. Pancreas: Unremarkable. No pancreatic ductal dilatation or surrounding inflammatory changes. Spleen: Spleen size is upper limits of normal.  No focal lesions. Adrenals/Urinary Tract: Adrenal glands are unremarkable. Kidneys are normal, without renal calculi, focal lesion, or hydronephrosis. Bladder is unremarkable. Stomach/Bowel: Stomach, small bowel, and colon are not abnormally distended. No wall thickening or inflammatory changes. Appendix is normal. Vascular/Lymphatic: Diffuse aortic calcification. No aneurysm. An inferior vena caval filter is present. Stent in the left common iliac vein. No flow is demonstrated in the visualized inferior  vena cava below the stent or in the iliac and external iliac veins with mild stranding around the external iliac veins. This likely indicates venous thrombosis. 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 Reproductive: Uterus and bilateral adnexa are unremarkable. Other: Infiltration in the subcutaneous fat around the right hip and right iliopsoas region likely representing edema although infection could also have this appearance. No loculated collections. No free air or free fluid in the abdomen. Musculoskeletal: No acute or significant osseous  findings. IMPRESSION: 1. Hepatic cirrhosis with portal venous hypertension including multiple upper abdominal and esophageal varices as well as mild splenic enlargement. 2. Probable heterogeneous fatty infiltration of the liver. Fatty infiltration has improved since the previous study. 3. Cholelithiasis without CT evidence of acute cholecystitis. 4. Inferior vena caval filter is in place with probable venous thrombosis below this level and extending into the pelvic veins. Infiltration in the right iliopsoas region and soft tissues around the right hip likely indicate edema although infection could also have this appearance. 5. Aortic atherosclerosis. Electronically Signed   By: Burman Nieves M.D.   On: 01/27/2023 18:43   VAS [place] LOWER EXTREMITY VENOUS (DVT) (7a-7p)  Result Date: 01/27/2023  Lower Venous DVT Study Patient Name:  ALIVIA CIMINO  Date of Exam:   01/27/2023 Medical Rec #: [id number]                Accession #:    [id number] Date of Birth: [birth year]                 Patient Gender: F Patient Age:   10 years Exam Location:  [facility] Procedure:      VAS [place] LOWER EXTREMITY VENOUS (DVT) Referring Phys: Claudette Stapler --------------------------------------------------------------------------------  Indications: Pain, and Swelling. Patient has IVC filter per CT done 11/14/20  Risk Factors: Personal history of DVT. Patient with Cirrhosis Anticoagulation: Coumadin. Patient taken off Warfarin 5 days for dental procedure. Comparison Study: No prior study on file Performing Technologist: Sherren Kerns RVS  Examination Guidelines: A complete evaluation includes B-mode imaging, spectral Doppler, color Doppler, and power Doppler as needed of all accessible portions of each vessel. Bilateral testing is considered an integral part of a complete examination. Limited examinations for reoccurring indications may be performed as noted. The reflux portion of the exam is performed with the patient in  reverse Trendelenburg.  +---------+---------------+---------+-----------+----------+-------------------+ RIGHT    CompressibilityPhasicitySpontaneityPropertiesThrombus Aging      +---------+---------------+---------+-----------+----------+-------------------+ CFV      None           No       No                   Acute               +---------+---------------+---------+-----------+----------+-------------------+ SFJ      None                                         Acute               +---------+---------------+---------+-----------+----------+-------------------+ FV Prox  None           No       No                   Acute               +---------+---------------+---------+-----------+----------+-------------------+  FV Mid   None           No       No                   Acute               +---------+---------------+---------+-----------+----------+-------------------+ FV DistalNone           No       No                   Acute               +---------+---------------+---------+-----------+----------+-------------------+ PFV      Partial        No       Yes                  Acute               +---------+---------------+---------+-----------+----------+-------------------+ POP      None           No       No                   Acute               +---------+---------------+---------+-----------+----------+-------------------+ PTV      None                                         Acute prox to mid                                                         portion             +---------+---------------+---------+-----------+----------+-------------------+ PERO     None                                         Acute prox to mid                                                         portion             +---------+---------------+---------+-----------+----------+-------------------+ EIV      None           No       No                    Acute               +---------+---------------+---------+-----------+----------+-------------------+ CIV                     No       No                   acute               +---------+---------------+---------+-----------+----------+-------------------+   Right Technical Findings: Not visualized segments include Unable to  visualize IVC.  +----+---------------+---------+-----------+----------+-----------------------+ LEFTCompressibilityPhasicitySpontaneityPropertiesThrombus Aging          +----+---------------+---------+-----------+----------+-----------------------+ CFV Full           Yes      Yes                                          +----+---------------+---------+-----------+----------+-----------------------+ EIV                                              Acute                   +----+---------------+---------+-----------+----------+-----------------------+ CIV                                              Acute in distal portion +----+---------------+---------+-----------+----------+-----------------------+   Left Technical Findings: Not visualized segments include Unable to visualize proximal to mid CIV, IVC.   Summary: RIGHT: - Findings consistent with acute deep vein thrombosis involving the right common femoral vein, SF junction, right femoral vein, right proximal profunda vein, right popliteal vein, right posterior tibial veins, right peroneal veins, and EIV, CIV. - A cystic structure is found in the popliteal fossa.  LEFT: - Findings consistent with acute deep vein thrombosis involving the EIV, distal CIV.  *See table(s) above for measurements and observations.    Preliminary         Scheduled Meds:  acetaminophen  1,000 mg Oral Once   aspirin EC  81 mg Oral Daily   insulin aspart  0-20 Units Subcutaneous Q4H   Continuous Infusions:  heparin 1,500 Units/hr (01/29/23 0309)     LOS: 1 day    Time spent: 35 minutes    Colton Engdahl Darleen Crocker, DO [facility]  If 7PM-7AM, please contact night-coverage [email or web address] 01/29/2023, 9:51 AM

## 2023-01-29 NOTE — Anesthesia Procedure Notes (Signed)
Procedure Name: MAC Date/Time: 01/29/2023 12:25 PM  Performed by: Heide Scales, CRNAPre-anesthesia Checklist: Patient identified, Emergency Drugs available, Suction available and Patient being monitored Patient Re-evaluated:Patient Re-evaluated prior to induction Oxygen Delivery Method: Nasal cannula Preoxygenation: Pre-oxygenation with 100% oxygen Dental Injury: Teeth and Oropharynx as per pre-operative assessment

## 2023-01-29 NOTE — Anesthesia Preprocedure Evaluation (Signed)
Anesthesia Evaluation  Patient identified by MRN, date of birth, ID band Patient awake    Reviewed: Allergy & Precautions, NPO status , Patient's Chart, lab work & pertinent test results  Airway Mallampati: II  TM Distance: >3 FB Neck ROM: Full    Dental  (+) Missing   Pulmonary former smoker   Pulmonary exam normal breath sounds clear to auscultation       Cardiovascular negative cardio ROS Normal cardiovascular exam Rhythm:Regular Rate:Normal     Neuro/Psych negative neurological ROS     GI/Hepatic negative GI ROS, Neg liver ROS,,,  Endo/Other  diabetes, Type 2    Renal/GU negative Renal ROS     Musculoskeletal negative musculoskeletal ROS (+)    Abdominal   Peds  Hematology  (+) Blood dyscrasia (Thombocytopenia) Protein S deficiency  Protein C deficiency   Anesthesia Other Findings Day of surgery medications reviewed with the patient.  Reproductive/Obstetrics                             Anesthesia Physical Anesthesia Plan  ASA: 3  Anesthesia Plan: MAC   Post-op Pain Management: Tylenol PO (pre-op)*   Induction: Intravenous  PONV Risk Score and Plan: 2 and Treatment may vary due to age or medical condition and Midazolam  Airway Management Planned: Simple Face Mask and Natural Airway  Additional Equipment:   Intra-op Plan:   Post-operative Plan:   Informed Consent: I have reviewed the patients History and Physical, chart, labs and discussed the procedure including the risks, benefits and alternatives for the proposed anesthesia with the patient or authorized representative who has indicated his/her understanding and acceptance.     Dental advisory given  Plan Discussed with: CRNA  Anesthesia Plan Comments:        Anesthesia Quick Evaluation

## 2023-01-29 NOTE — Progress Notes (Signed)
  Daily Progress Note  Dx: Iliocaval DVT extending into the right leg.   Subjective: Continues to have right leg pain  Objective: Vitals:   01/29/23 0950 01/29/23 1033  BP:  137/88  Pulse:  69  Resp: 14 18  Temp:  98.1 F (36.7 C)  SpO2:  97%    Physical Examination Palpable pulses in the feet bilaterally Less edema in the right lower extremity Sensorimotor intact Nonlabored breathing Regular rate, normal rhythm   ASSESSMENT/PLAN:  Patient is a 52 year old female with known protein C, protein S deficiency, recently taken off of her warfarin for a dental procedure.  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  The thrombus propagates down both iliac veins, and continues into the right lower extremity.  I had a long conversation with her regarding the above.  Being that she is symptomatic, she would benefit from percutaneous mechanical thrombectomy.  The filter has been in place since 2005, and intimal hyperplasia has likely occurred.  I am unsure as to whether this is the nidus that she has previously undergone dental work without anticoagulation and had no issues.  Plan for bilateral popliteal vein access in the prone position and lytic cathter placement today.     Cassandria Santee MD MS [facility] [phone] 01/29/2023  11:59 AM

## 2023-01-29 NOTE — Progress Notes (Signed)
ANTICOAGULATION CONSULT NOTE - Follow-up Note  Pharmacy Consult for Heparin Indication: DVT   No Known Allergies  Patient Measurements: Height: 5\' 4"  (162.6 cm) Weight: 75.1 kg (165 lb 9.1 oz) IBW/kg (Calculated) : 54.7 Heparin Dosing Weight: 70kg  Vital Signs: Temp: 98.1 F (36.7 C) (02/05 1415) Temp Source: Oral (02/05 1033) BP: 163/92 (02/05 1430) Pulse Rate: 71 (02/05 1430)  Labs: Recent Labs    01/27/23 1447 01/27/23 1630 01/28/23 0002 01/28/23 0714 01/28/23 1516 01/28/23 2053 01/29/23 0101  HGB 14.0  --  12.2  --   --   --  12.5  HCT 39.4  --  34.0*  --   --   --  35.8*  PLT PLATELET CLUMPS NOTED ON SMEAR, UNABLE TO ESTIMATE  --  96*  --   --   --  107*  APTT  --  28  --   --   --   --   --   LABPROT  --  17.3*  --   --   --   --   --   INR  --  1.4*  --   --   --   --   --   HEPARINUNFRC  --   --  0.41   < > 0.47 0.45 0.44  CREATININE 0.80  --  0.80  --   --   --  0.64  CKTOTAL  --   --  25*  --   --   --   --    < > = values in this interval not displayed.     Estimated Creatinine Clearance: 83.5 mL/min (by C-G formula based on SCr of 0.64 mg/dL).   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 Patient reports being s/p IVC filter. Patient is presenting with leg pain and swelling. Patient is on warfarin prior to arrival but has stopped x 5 days for gum surgery. Per patient takes 6mg  daily prior to that. Heparin per pharmacy consult placed for DVT.   She is s/p thrombectomy using alteplase and remains on 0.5 mg/hr in each catheter. Heparin drip was resumed at 400 units/hr in each catheter also. No bleeding noted, Hgb stable 12.5 and platelets are low at 107.  Goal of Therapy:  Heparin level 0.2-0.5 units/ml Monitor platelets by anticoagulation protocol: Yes   Plan:  Continue heparin infusion at 400 units/hr in each catheter (800 units/hr total) 6 hr heparin level Monitor daily heparin level  and CBC, s/sx bleeding F/u plan for alteplase  Thank you for involving pharmacy in this patient's care.  Renold Genta, PharmD, BCPS Clinical Pharmacist Clinical phone for 01/29/2023 is [phone] 01/29/2023 3:16 PM

## 2023-01-30 ENCOUNTER — Encounter (HOSPITAL_COMMUNITY): Payer: Self-pay | Admitting: Internal Medicine

## 2023-01-30 ENCOUNTER — Encounter (HOSPITAL_COMMUNITY)
Admission: EM | Disposition: A | Discharge status: Home / Self Care | Payer: Self-pay | Source: Home / Self Care | Attending: Internal Medicine

## 2023-01-30 ENCOUNTER — Inpatient Hospital Stay (HOSPITAL_COMMUNITY): Payer: BC Managed Care – PPO | Admitting: Anesthesiology

## 2023-01-30 ENCOUNTER — Inpatient Hospital Stay (HOSPITAL_COMMUNITY): Payer: BC Managed Care – PPO

## 2023-01-30 ENCOUNTER — Other Ambulatory Visit: Payer: Self-pay

## 2023-01-30 DIAGNOSIS — I82491 Acute embolism and thrombosis of other specified deep vein of right lower extremity: Secondary | ICD-10-CM | POA: Diagnosis not present

## 2023-01-30 HISTORY — PX: INSERTION OF ILIAC STENT: SHX6256

## 2023-01-30 HISTORY — PX: VENOGRAM: SHX5497

## 2023-01-30 HISTORY — PX: PERCUTANEOUS VENOUS THROMBECTOMY,LYSIS WITH INTRAVASCULAR ULTRASOUND (IVUS): SHX6751

## 2023-01-30 LAB — POCT I-STAT, CHEM 8
BUN: <3 mg/dL — ABNORMAL LOW (ref 6–20)
Calcium, Ion: 1.14 mmol/L — ABNORMAL LOW (ref 1.15–1.40)
Chloride: 107 mmol/L (ref 98–111)
Creatinine, Ser: 0.3 mg/dL — ABNORMAL LOW (ref 0.44–1.00)
Glucose, Bld: 189 mg/dL — ABNORMAL HIGH (ref 70–99)
HCT: 26 % — ABNORMAL LOW (ref 36.0–46.0)
Hemoglobin: 8.8 g/dL — ABNORMAL LOW (ref 12.0–15.0)
Potassium: 3.9 mmol/L (ref 3.5–5.1)
Sodium: 141 mmol/L (ref 135–145)
TCO2: 22 mmol/L (ref 22–32)

## 2023-01-30 LAB — CBC
HCT: 34 % — ABNORMAL LOW (ref 36.0–46.0)
HCT: 34.7 % — ABNORMAL LOW (ref 36.0–46.0)
Hemoglobin: 12 g/dL (ref 12.0–15.0)
Hemoglobin: 12 g/dL (ref 12.0–15.0)
MCH: 35.3 pg — ABNORMAL HIGH (ref 26.0–34.0)
MCH: 35.8 pg — ABNORMAL HIGH (ref 26.0–34.0)
MCHC: 34.6 g/dL (ref 30.0–36.0)
MCHC: 35.3 g/dL (ref 30.0–36.0)
MCV: 101.5 fL — ABNORMAL HIGH (ref 80.0–100.0)
MCV: 102.1 fL — ABNORMAL HIGH (ref 80.0–100.0)
Platelets: 86 10*3/uL — ABNORMAL LOW (ref 150–400)
Platelets: 91 10*3/uL — ABNORMAL LOW (ref 150–400)
RBC: 3.35 MIL/uL — ABNORMAL LOW (ref 3.87–5.11)
RBC: 3.4 MIL/uL — ABNORMAL LOW (ref 3.87–5.11)
RDW: 12.7 % (ref 11.5–15.5)
RDW: 12.7 % (ref 11.5–15.5)
WBC: 5.8 10*3/uL (ref 4.0–10.5)
WBC: 6.9 10*3/uL (ref 4.0–10.5)
nRBC: 0 % (ref 0.0–0.2)
nRBC: 0 % (ref 0.0–0.2)

## 2023-01-30 LAB — GLUCOSE, CAPILLARY
Glucose-Capillary: 210 mg/dL — ABNORMAL HIGH (ref 70–99)
Glucose-Capillary: 173 mg/dL — ABNORMAL HIGH (ref 70–99)
Glucose-Capillary: 147 mg/dL — ABNORMAL HIGH (ref 70–99)
Glucose-Capillary: 85 mg/dL (ref 70–99)
Glucose-Capillary: 160 mg/dL — ABNORMAL HIGH (ref 70–99)
Glucose-Capillary: 185 mg/dL — ABNORMAL HIGH (ref 70–99)
Glucose-Capillary: 197 mg/dL — ABNORMAL HIGH (ref 70–99)

## 2023-01-30 LAB — POCT I-STAT 7, (LYTES, BLD GAS, ICA,H+H)
Acid-base deficit: 3 mmol/L — ABNORMAL HIGH (ref 0.0–2.0)
Acid-base deficit: 9 mmol/L — ABNORMAL HIGH (ref 0.0–2.0)
Bicarbonate: 22.5 mmol/L (ref 20.0–28.0)
Bicarbonate: 18.4 mmol/L — ABNORMAL LOW (ref 20.0–28.0)
Calcium, Ion: 1.14 mmol/L — ABNORMAL LOW (ref 1.15–1.40)
Calcium, Ion: 1.06 mmol/L — ABNORMAL LOW (ref 1.15–1.40)
HCT: 31 % — ABNORMAL LOW (ref 36.0–46.0)
HCT: 28 % — ABNORMAL LOW (ref 36.0–46.0)
Hemoglobin: 10.5 g/dL — ABNORMAL LOW (ref 12.0–15.0)
Hemoglobin: 9.5 g/dL — ABNORMAL LOW (ref 12.0–15.0)
O2 Saturation: 99 %
O2 Saturation: 99 %
Potassium: 4.4 mmol/L (ref 3.5–5.1)
Potassium: 4 mmol/L (ref 3.5–5.1)
Sodium: 140 mmol/L (ref 135–145)
Sodium: 141 mmol/L (ref 135–145)
TCO2: 24 mmol/L (ref 22–32)
TCO2: 20 mmol/L — ABNORMAL LOW (ref 22–32)
pCO2 arterial: 43.3 mmHg (ref 32–48)
pCO2 arterial: 46.5 mmHg (ref 32–48)
pH, Arterial: 7.323 — ABNORMAL LOW (ref 7.35–7.45)
pH, Arterial: 7.206 — ABNORMAL LOW (ref 7.35–7.45)
pO2, Arterial: 169 mmHg — ABNORMAL HIGH (ref 83–108)
pO2, Arterial: 175 mmHg — ABNORMAL HIGH (ref 83–108)

## 2023-01-30 LAB — BASIC METABOLIC PANEL
Anion gap: 12 (ref 5–15)
BUN: 5 mg/dL — ABNORMAL LOW (ref 6–20)
CO2: 21 mmol/L — ABNORMAL LOW (ref 22–32)
Calcium: 7.8 mg/dL — ABNORMAL LOW (ref 8.9–10.3)
Chloride: 102 mmol/L (ref 98–111)
Creatinine, Ser: 0.56 mg/dL (ref 0.44–1.00)
GFR, Estimated: >60 mL/min (ref >60)
Glucose, Bld: 178 mg/dL — ABNORMAL HIGH (ref 70–99)
Potassium: 3 mmol/L — ABNORMAL LOW (ref 3.5–5.1)
Sodium: 135 mmol/L (ref 135–145)

## 2023-01-30 LAB — POCT ACTIVATED CLOTTING TIME
Activated Clotting Time: 369 seconds
Activated Clotting Time: 266 seconds
Activated Clotting Time: 239 seconds

## 2023-01-30 LAB — HEPARIN LEVEL (UNFRACTIONATED): Heparin Unfractionated: <0.1 IU/mL — ABNORMAL LOW (ref 0.30–0.70)

## 2023-01-30 LAB — MAGNESIUM: Magnesium: 1.6 mg/dL — ABNORMAL LOW (ref 1.7–2.4)

## 2023-01-30 LAB — FIBRINOGEN
Fibrinogen: 316 mg/dL (ref 210–475)
Fibrinogen: 338 mg/dL (ref 210–475)

## 2023-01-30 LAB — PREPARE RBC (CROSSMATCH)

## 2023-01-30 LAB — T3: T3, Total: 164 ng/dL (ref 71–180)

## 2023-01-30 SURGERY — PERIPHERAL VASCULAR THROMBECTOMY
Anesthesia: LOCAL

## 2023-01-30 SURGERY — VENOGRAM
Anesthesia: Monitor Anesthesia Care | Site: Chest

## 2023-01-30 MED ORDER — DEXMEDETOMIDINE HCL IN NACL 80 MCG/20ML IV SOLN
INTRAVENOUS | Status: AC
  Filled 2023-01-30: qty 20

## 2023-01-30 MED ORDER — HEPARIN SODIUM (PORCINE) 1000 UNIT/ML IJ SOLN
INTRAMUSCULAR | Status: DC | PRN
  Administered 2023-01-30 (×5): 5000 [IU] via INTRAVENOUS
  Administered 2023-01-30: 6000 [IU] via INTRAVENOUS
  Administered 2023-01-30: 5000 [IU] via INTRAVENOUS

## 2023-01-30 MED ORDER — 0.9 % SODIUM CHLORIDE (POUR BTL) OPTIME
TOPICAL | Status: DC | PRN
  Administered 2023-01-30: 1000 mL

## 2023-01-30 MED ORDER — LACTATED RINGERS IV SOLN: INTRAVENOUS | Status: DC

## 2023-01-30 MED ORDER — SODIUM CHLORIDE (PF) 0.9 % IJ SOLN
INTRAVENOUS | Status: DC | PRN
  Administered 2023-01-30: 100 mL via INTRAMUSCULAR

## 2023-01-30 MED ORDER — DEXAMETHASONE SODIUM PHOSPHATE 10 MG/ML IJ SOLN
INTRAMUSCULAR | Status: DC | PRN
  Administered 2023-01-30: 8 mg via INTRAVENOUS

## 2023-01-30 MED ORDER — ROCURONIUM BROMIDE 10 MG/ML (PF) SYRINGE
PREFILLED_SYRINGE | INTRAVENOUS | Status: AC
  Filled 2023-01-30: qty 20

## 2023-01-30 MED ORDER — LACTATED RINGERS IV SOLN: INTRAVENOUS | Status: DC | PRN

## 2023-01-30 MED ORDER — PHENYLEPHRINE HCL-NACL 20-0.9 MG/250ML-% IV SOLN
INTRAVENOUS | Status: DC | PRN
  Administered 2023-01-30: 25 ug/min via INTRAVENOUS

## 2023-01-30 MED ORDER — FENTANYL CITRATE (PF) 100 MCG/2ML IJ SOLN
INTRAMUSCULAR | Status: AC
  Filled 2023-01-30: qty 2

## 2023-01-30 MED ORDER — LIDOCAINE 2% (20 MG/ML) 5 ML SYRINGE
INTRAMUSCULAR | Status: AC
  Filled 2023-01-30: qty 10

## 2023-01-30 MED ORDER — SUGAMMADEX SODIUM 200 MG/2ML IV SOLN
INTRAVENOUS | Status: DC | PRN
  Administered 2023-01-30: 200 mg via INTRAVENOUS

## 2023-01-30 MED ORDER — CHLORHEXIDINE GLUCONATE 0.12 % MT SOLN: 15.0000 mL | Freq: Once | OROMUCOSAL | Status: DC

## 2023-01-30 MED ORDER — LABETALOL HCL 5 MG/ML IV SOLN
INTRAVENOUS | Status: AC
  Filled 2023-01-30: qty 4

## 2023-01-30 MED ORDER — HEPARIN 6000 UNIT IRRIGATION SOLUTION
Status: AC
  Filled 2023-01-30: qty 500

## 2023-01-30 MED ORDER — POTASSIUM CHLORIDE CRYS ER 20 MEQ PO TBCR
40.0000 meq | EXTENDED_RELEASE_TABLET | Freq: Once | ORAL | Status: AC
  Administered 2023-01-30: 40 meq via ORAL
  Filled 2023-01-30: qty 2

## 2023-01-30 MED ORDER — MORPHINE SULFATE (PF) 4 MG/ML IV SOLN
5.0000 mg | INTRAVENOUS | Status: DC | PRN
  Administered 2023-02-01: 5 mg via INTRAVENOUS
  Filled 2023-01-30 (×2): qty 2

## 2023-01-30 MED ORDER — ORAL CARE MOUTH RINSE: 15.0000 mL | Freq: Once | OROMUCOSAL | Status: DC

## 2023-01-30 MED ORDER — ONDANSETRON HCL 4 MG/2ML IJ SOLN: 4.0000 mg | Freq: Once | INTRAMUSCULAR | Status: DC | PRN

## 2023-01-30 MED ORDER — ONDANSETRON HCL 4 MG/2ML IJ SOLN
INTRAMUSCULAR | Status: AC
  Filled 2023-01-30: qty 2

## 2023-01-30 MED ORDER — MIDAZOLAM HCL 2 MG/2ML IJ SOLN
INTRAMUSCULAR | Status: AC
  Filled 2023-01-30: qty 2

## 2023-01-30 MED ORDER — SODIUM CHLORIDE 0.9 % IV SOLN: INTRAVENOUS | Status: DC

## 2023-01-30 MED ORDER — FENTANYL CITRATE (PF) 250 MCG/5ML IJ SOLN
INTRAMUSCULAR | Status: AC
  Filled 2023-01-30: qty 5

## 2023-01-30 MED ORDER — HEPARIN 6000 UNIT IRRIGATION SOLUTION
Status: DC | PRN
  Administered 2023-01-30 (×4): 1

## 2023-01-30 MED ORDER — CHLORHEXIDINE GLUCONATE 0.12 % MT SOLN
OROMUCOSAL | Status: AC
  Filled 2023-01-30: qty 15

## 2023-01-30 MED ORDER — PROPOFOL 10 MG/ML IV BOLUS
INTRAVENOUS | Status: AC
  Filled 2023-01-30: qty 20

## 2023-01-30 MED ORDER — SODIUM CHLORIDE 0.9% IV SOLUTION: Freq: Once | INTRAVENOUS | Status: DC

## 2023-01-30 MED ORDER — CEFAZOLIN SODIUM-DEXTROSE 1-4 GM/50ML-% IV SOLN
1.0000 g | Freq: Three times a day (TID) | INTRAVENOUS | Status: AC
  Administered 2023-01-31 (×2): 1 g via INTRAVENOUS
  Filled 2023-01-30 (×3): qty 50

## 2023-01-30 MED ORDER — HEPARIN SODIUM (PORCINE) 1000 UNIT/ML IJ SOLN
INTRAMUSCULAR | Status: AC
  Filled 2023-01-30: qty 2

## 2023-01-30 MED ORDER — DEXAMETHASONE SODIUM PHOSPHATE 10 MG/ML IJ SOLN
INTRAMUSCULAR | Status: AC
  Filled 2023-01-30: qty 2

## 2023-01-30 MED ORDER — MIDAZOLAM HCL 2 MG/2ML IJ SOLN
1.0000 mg | Freq: Once | INTRAMUSCULAR | Status: AC
  Administered 2023-01-30: 1 mg via INTRAVENOUS

## 2023-01-30 MED ORDER — ONDANSETRON HCL 4 MG/2ML IJ SOLN
4.0000 mg | Freq: Four times a day (QID) | INTRAMUSCULAR | Status: DC | PRN
  Administered 2023-02-03: 4 mg via INTRAVENOUS
  Filled 2023-01-30: qty 2

## 2023-01-30 MED ORDER — VASOPRESSIN 20 UNIT/ML IV SOLN
INTRAVENOUS | Status: AC
  Filled 2023-01-30: qty 1

## 2023-01-30 MED ORDER — FENTANYL CITRATE (PF) 250 MCG/5ML IJ SOLN
INTRAMUSCULAR | Status: DC | PRN
  Administered 2023-01-30: 50 ug via INTRAVENOUS
  Administered 2023-01-30: 100 ug via INTRAVENOUS
  Administered 2023-01-30 (×4): 25 ug via INTRAVENOUS

## 2023-01-30 MED ORDER — OXYCODONE HCL 5 MG PO TABS: 5.0000 mg | ORAL_TABLET | Freq: Once | ORAL | Status: DC | PRN

## 2023-01-30 MED ORDER — IODIXANOL 320 MG/ML IV SOLN
INTRAVENOUS | Status: DC | PRN
  Administered 2023-01-30: 100 mL

## 2023-01-30 MED ORDER — PROPOFOL 10 MG/ML IV BOLUS
INTRAVENOUS | Status: DC | PRN
  Administered 2023-01-30: 200 mg via INTRAVENOUS

## 2023-01-30 MED ORDER — POTASSIUM CHLORIDE 10 MEQ/100ML IV SOLN
10.0000 meq | INTRAVENOUS | Status: AC
  Administered 2023-01-30 (×2): 10 meq via INTRAVENOUS
  Filled 2023-01-30 (×2): qty 100

## 2023-01-30 MED ORDER — VASOPRESSIN 20 UNIT/ML IV SOLN
INTRAVENOUS | Status: DC | PRN
  Administered 2023-01-30 (×2): 1 [IU] via INTRAVENOUS

## 2023-01-30 MED ORDER — LABETALOL HCL 5 MG/ML IV SOLN: 10.0000 mg | INTRAVENOUS | Status: DC | PRN

## 2023-01-30 MED ORDER — INSULIN GLARGINE-YFGN 100 UNIT/ML ~~LOC~~ SOLN
5.0000 [IU] | Freq: Every day | SUBCUTANEOUS | Status: DC
  Administered 2023-01-31 – 2023-02-01 (×2): 5 [IU] via SUBCUTANEOUS
  Filled 2023-01-30 (×4): qty 0.05

## 2023-01-30 MED ORDER — HEPARIN SODIUM (PORCINE) 1000 UNIT/ML IJ SOLN
INTRAMUSCULAR | Status: AC
  Filled 2023-01-30: qty 30

## 2023-01-30 MED ORDER — MIDAZOLAM HCL 2 MG/2ML IJ SOLN
INTRAMUSCULAR | Status: DC | PRN
  Administered 2023-01-30: 2 mg via INTRAVENOUS

## 2023-01-30 MED ORDER — SODIUM CHLORIDE 0.9 % IV SOLN: INTRAVENOUS | Status: DC | PRN

## 2023-01-30 MED ORDER — ROCURONIUM BROMIDE 10 MG/ML (PF) SYRINGE
PREFILLED_SYRINGE | INTRAVENOUS | Status: DC | PRN
  Administered 2023-01-30 (×3): 20 mg via INTRAVENOUS
  Administered 2023-01-30: 70 mg via INTRAVENOUS
  Administered 2023-01-30 (×2): 20 mg via INTRAVENOUS

## 2023-01-30 MED ORDER — ALBUMIN HUMAN 5 % IV SOLN: INTRAVENOUS | Status: DC | PRN

## 2023-01-30 MED ORDER — MAGNESIUM SULFATE 2 GM/50ML IV SOLN
2.0000 g | Freq: Once | INTRAVENOUS | Status: AC
  Administered 2023-01-30: 2 g via INTRAVENOUS
  Filled 2023-01-30: qty 50

## 2023-01-30 MED ORDER — ONDANSETRON HCL 4 MG/2ML IJ SOLN
INTRAMUSCULAR | Status: DC | PRN
  Administered 2023-01-30 (×2): 4 mg via INTRAVENOUS

## 2023-01-30 MED ORDER — OXYCODONE HCL 5 MG/5ML PO SOLN: 5.0000 mg | Freq: Once | ORAL | Status: DC | PRN

## 2023-01-30 MED ORDER — ROCURONIUM BROMIDE 10 MG/ML (PF) SYRINGE
PREFILLED_SYRINGE | INTRAVENOUS | Status: AC
  Filled 2023-01-30: qty 10

## 2023-01-30 MED ORDER — POTASSIUM CHLORIDE CRYS ER 20 MEQ PO TBCR: 20.0000 meq | EXTENDED_RELEASE_TABLET | Freq: Once | ORAL | Status: DC

## 2023-01-30 MED ORDER — FENTANYL CITRATE (PF) 100 MCG/2ML IJ SOLN
25.0000 ug | INTRAMUSCULAR | Status: DC | PRN
  Administered 2023-01-30: 25 ug via INTRAVENOUS

## 2023-01-30 MED ORDER — LIDOCAINE 2% (20 MG/ML) 5 ML SYRINGE
INTRAMUSCULAR | Status: DC | PRN
  Administered 2023-01-30: 100 mg via INTRAVENOUS

## 2023-01-30 MED ORDER — CEFAZOLIN SODIUM-DEXTROSE 2-3 GM-%(50ML) IV SOLR
INTRAVENOUS | Status: DC | PRN
  Administered 2023-01-30 (×2): 2 g via INTRAVENOUS

## 2023-01-30 SURGICAL SUPPLY — 130 items
BAG COUNTER SPONGE SURGICOUNT (BAG) ×2 IMPLANT
BAG SNAP BAND KOVER 36X36 (MISCELLANEOUS) ×2 IMPLANT
BALLN ATLAS 16X40X75 (BALLOONS) ×2
BALLN CODA OCL 2-10-35-140-46 (BALLOONS) ×2
BALLN MUSTANG 12X60X75 (BALLOONS) ×2
BALLN MUSTANG 6.0X40 75 (BALLOONS) ×2
BALLOON ATLAS 16X40X75 (BALLOONS) IMPLANT
BALLOON COD OCL 2-10-35-140-46 (BALLOONS) IMPLANT
BALLOON MUSTANG 12X60X75 (BALLOONS) IMPLANT
BALLOON MUSTANG 6.0X40 75 (BALLOONS) IMPLANT
BLADE SURG 11 STRL SS (BLADE) ×2 IMPLANT
BNDG COHESIVE 4X5 TAN STRL LF (GAUZE/BANDAGES/DRESSINGS) IMPLANT
BNDG COHESIVE 6X5 TAN ST LF (GAUZE/BANDAGES/DRESSINGS) IMPLANT
BNDG ELASTIC 4X5.8 VLCR STR LF (GAUZE/BANDAGES/DRESSINGS) IMPLANT
BNDG GAUZE DERMACEA FLUFF 4 (GAUZE/BANDAGES/DRESSINGS) IMPLANT
CANISTER PENUMBRA ENGINE (MISCELLANEOUS) IMPLANT
CANISTER SUCT 3000ML PPV (MISCELLANEOUS) ×2 IMPLANT
CATH ANGIO 5F BER2 65CM (CATHETERS) ×2 IMPLANT
CATH BEACON 5 .035 65 KMP TIP (CATHETERS) IMPLANT
CATH INFINITI 5 FR IM (CATHETERS) IMPLANT
CATH LIGHTNI FLASH 16XTORQ 100 (CATHETERS) IMPLANT
CATH LIGHTNING FLASH XTORQ 100 (CATHETERS) ×2
CATH OMNI FLUSH .035X70CM (CATHETERS) IMPLANT
CATH ROBINSON RED A/P 18FR (CATHETERS) IMPLANT
CATH SOS OMNI O 5F 80CM (CATHETERS) IMPLANT
CATH TEMPO 5F RIM 65CM (CATHETERS) IMPLANT
CATH VISIONS PV .035 IVUS (CATHETERS) IMPLANT
CHLORAPREP W/TINT 26 (MISCELLANEOUS) ×2 IMPLANT
CLIP LIGATING EXTRA MED SLVR (CLIP) ×2 IMPLANT
CLIP LIGATING EXTRA SM BLUE (MISCELLANEOUS) ×2 IMPLANT
COVER DOME SNAP 22 D (MISCELLANEOUS) ×2 IMPLANT
COVER MAYO STAND STRL (DRAPES) ×2 IMPLANT
COVER PROBE W GEL 5X96 (DRAPES) ×2 IMPLANT
COVER SURGICAL LIGHT HANDLE (MISCELLANEOUS) ×2 IMPLANT
DERMABOND ADVANCED .7 DNX12 (GAUZE/BANDAGES/DRESSINGS) ×2 IMPLANT
DEVICE ENSNARE  12MMX20MM (VASCULAR PRODUCTS) ×4
DEVICE ENSNARE 12MMX20MM (VASCULAR PRODUCTS) IMPLANT
DEVICE ONE SNARE 10MM (MISCELLANEOUS) IMPLANT
DEVICE ONE SNARE 15MM (VASCULAR PRODUCTS) IMPLANT
DRAIN CHANNEL 15F RND FF W/TCR (WOUND CARE) IMPLANT
DRAPE INCISE IOBAN 66X45 STRL (DRAPES) IMPLANT
DRAPE ORTHO SPLIT 77X108 STRL (DRAPES) ×4
DRAPE SURG ORHT 6 SPLT 77X108 (DRAPES) IMPLANT
DRAPE X-RAY CASS 24X20 (DRAPES) IMPLANT
ELECT REM PT RETURN 9FT ADLT (ELECTROSURGICAL)
ELECTRODE REM PT RTRN 9FT ADLT (ELECTROSURGICAL) IMPLANT
EVACUATOR SILICONE 100CC (DRAIN) IMPLANT
FORCEPS GRASP COMBO 8X230 (FORCEP) IMPLANT
GAUZE 4X4 16PLY ~~LOC~~+RFID DBL (SPONGE) IMPLANT
GAUZE SPONGE 2X2 8PLY STRL LF (GAUZE/BANDAGES/DRESSINGS) IMPLANT
GAUZE SPONGE 4X4 12PLY STRL (GAUZE/BANDAGES/DRESSINGS) IMPLANT
GAUZE SPONGE 4X4 16PLY XRAY LF (GAUZE/BANDAGES/DRESSINGS) IMPLANT
GLIDEWIRE ADV .035X180CM (WIRE) IMPLANT
GLIDEWIRE ADV .035X260CM (WIRE) IMPLANT
GLOVE BIO SURGEON STRL SZ7.5 (GLOVE) ×2 IMPLANT
GLOVE BIOGEL PI IND STRL 6.5 (GLOVE) IMPLANT
GLOVE SURG SS PI 6.5 STRL IVOR (GLOVE) IMPLANT
GLOVE SURG SS PI 7.5 STRL IVOR (GLOVE) IMPLANT
GOWN STRL REUS W/ TWL LRG LVL3 (GOWN DISPOSABLE) ×4 IMPLANT
GOWN STRL REUS W/ TWL XL LVL3 (GOWN DISPOSABLE) ×2 IMPLANT
GOWN STRL REUS W/TWL LRG LVL3 (GOWN DISPOSABLE) ×4
GOWN STRL REUS W/TWL XL LVL3 (GOWN DISPOSABLE) ×4
GRAFT BALLN CATH 65CM (STENTS) IMPLANT
GUIDEWIRE ANGLED .035X260CM (WIRE) IMPLANT
HEMOSTAT SNOW SURGICEL 2X4 (HEMOSTASIS) IMPLANT
KIT BASIN OR (CUSTOM PROCEDURE TRAY) ×2 IMPLANT
KIT ENCORE 26 ADVANTAGE (KITS) IMPLANT
KIT SNARE RETRIEVAL (KITS) IMPLANT
KIT TURNOVER KIT B (KITS) ×2 IMPLANT
NDL HYPO 25GX1X1/2 BEV (NEEDLE) IMPLANT
NDL PERC 18GX7CM (NEEDLE) ×2 IMPLANT
NEEDLE HYPO 25GX1X1/2 BEV (NEEDLE) IMPLANT
NEEDLE PERC 18GX7CM (NEEDLE) IMPLANT
NS IRRIG 1000ML POUR BTL (IV SOLUTION) ×2 IMPLANT
PACK CV ACCESS (CUSTOM PROCEDURE TRAY) IMPLANT
PACK ENDO MINOR (CUSTOM PROCEDURE TRAY) ×2 IMPLANT
PACK GENERAL/GYN (CUSTOM PROCEDURE TRAY) ×2 IMPLANT
PACK PERIPHERAL VASCULAR (CUSTOM PROCEDURE TRAY) IMPLANT
PAD ARMBOARD 7.5X6 YLW CONV (MISCELLANEOUS) ×4 IMPLANT
PADDING CAST ABS COTTON 6X4 NS (CAST SUPPLIES) IMPLANT
POWDER SURGICEL 3.0 GRAM (HEMOSTASIS) IMPLANT
SET CLOVERSNARE FLT RETRIEVAL (MISCELLANEOUS) IMPLANT
SET COLLECT BLD 21X3/4 12 (NEEDLE) IMPLANT
SET MICROPUNCTURE 5F STIFF (MISCELLANEOUS) ×2 IMPLANT
SHEATH 16FRX70 (SHEATH) IMPLANT
SHEATH AVANTI 11CM 5FR (SHEATH) ×2 IMPLANT
SHEATH BRITE TIP 8FR 23CM (SHEATH) IMPLANT
SHEATH DRYSEAL FLEX 12FR 45CM (SHEATH) IMPLANT
SHEATH DRYSEAL FLEX 18FR 33CM (SHEATH) IMPLANT
SHEATH DRYSEAL FLEX 26FR 65CM (SHEATH) IMPLANT
SHEATH PINNACLE 5F 10CM (SHEATH) ×2 IMPLANT
SHEATH PINNACLE 8F 10CM (SHEATH) IMPLANT
SHEATH PINNACLE 9F 10CM (SHEATH) IMPLANT
SLEEVE ISOL F/PACE RF HD COVER (MISCELLANEOUS) IMPLANT
SNARE GOOSENECK 10MM (VASCULAR PRODUCTS) IMPLANT
SNARE GOOSENECK 15MM (MISCELLANEOUS) IMPLANT
STENT GRAFT BALLN CATH 65CM (STENTS) ×2
STENT WALLSTENTÂ  20X80X75 (Permanent Stent) IMPLANT
STOPCOCK 4 WAY LG BORE MALE ST (IV SETS) IMPLANT
STOPCOCK MORSE 400PSI 3WAY (MISCELLANEOUS) ×2 IMPLANT
SUT ETHILON 3 0 PS 1 (SUTURE) IMPLANT
SUT MNCRL AB 4-0 PS2 18 (SUTURE) IMPLANT
SUT PROLENE 5 0 C 1 24 (SUTURE) IMPLANT
SUT PROLENE 6 0 BV (SUTURE) IMPLANT
SUT PROLENE 7 0 BV 1 (SUTURE) IMPLANT
SUT SILK  1 MH (SUTURE) ×4
SUT SILK 1 MH (SUTURE) IMPLANT
SUT SILK 2 0 SH (SUTURE) IMPLANT
SUT VIC AB 2-0 CT1 27 (SUTURE)
SUT VIC AB 2-0 CT1 TAPERPNT 27 (SUTURE) IMPLANT
SUT VIC AB 3-0 SH 27 (SUTURE)
SUT VIC AB 3-0 SH 27X BRD (SUTURE) IMPLANT
SYR 10ML LL (SYRINGE) ×2 IMPLANT
SYR 20ML LL LF (SYRINGE) ×2 IMPLANT
SYR 30ML LL (SYRINGE) ×2 IMPLANT
SYR CONTROL 10ML LL (SYRINGE) IMPLANT
SYR MEDRAD MARK V 150ML (SYRINGE) IMPLANT
TAPE CLOTH SURG 4X10 WHT LF (GAUZE/BANDAGES/DRESSINGS) IMPLANT
TOWEL GREEN STERILE (TOWEL DISPOSABLE) ×2 IMPLANT
TOWEL GREEN STERILE FF (TOWEL DISPOSABLE) IMPLANT
TRAY FOLEY MTR SLVR 16FR STAT (SET/KITS/TRAYS/PACK) IMPLANT
TUBING EXTENTION W/L.L. (IV SETS) IMPLANT
TUBING HIGH PRESSURE 120CM (CONNECTOR) ×2 IMPLANT
UNDERPAD 30X36 HEAVY ABSORB (UNDERPADS AND DIAPERS) IMPLANT
WATER STERILE IRR 1000ML POUR (IV SOLUTION) IMPLANT
WIRE AMPLATZ SS-J .035X180CM (WIRE) IMPLANT
WIRE BENTSON .035X145CM (WIRE) ×2 IMPLANT
WIRE EMERALD 3MM-J .035X150CM (WIRE) IMPLANT
WIRE ROSEN-J .035X260CM (WIRE) IMPLANT
WIRE TORQFLEX AUST .018X40CM (WIRE) IMPLANT

## 2023-01-30 NOTE — Transfer of Care (Signed)
Immediate Anesthesia Transfer of Care Note  Patient: Shelby Conner  Procedure(s) Performed: Venogram (Abdomen) VENA CAVA FILTER REMOVAL (Chest) PERCUTANEOUS THROMBECTOMY WITH PENUMBRA (Abdomen) PERCUTANEOUS VENOUS THROMBECTOMY AND LYSIS WITH INTRAVASCULAR ULTRASOUND (IVUS) (Abdomen) INSERTION OF InternalVena Cava Stent (Chest)  Patient Location: PACU  Anesthesia Type:General  Level of Consciousness: awake, alert , and oriented  Airway & Oxygen Therapy: Patient Spontanous Breathing and Patient connected to nasal cannula oxygen  Post-op Assessment: Report given to RN and Post -op Vital signs reviewed and stable  Post vital signs: Reviewed and stable  Last Vitals:  Vitals Value Taken Time  BP 84/54 01/30/23 2251  Temp 36.5 C 01/30/23 2236  Pulse 72 01/30/23 2252  Resp 7 01/30/23 2252  SpO2 88 % 01/30/23 2252  Vitals shown include unvalidated device data.  Last Pain:  Vitals:   01/30/23 1316  TempSrc:   PainSc: 8       Patients Stated Pain Goal: 0 (81/77/11 6579)  Complications: No notable events documented.

## 2023-01-30 NOTE — Anesthesia Preprocedure Evaluation (Addendum)
Anesthesia Evaluation  Patient identified by MRN, date of birth, ID band Patient awake    Reviewed: Allergy & Precautions, NPO status , Patient's Chart, lab work & pertinent test results  History of Anesthesia Complications Negative for: history of anesthetic complications  Airway Mallampati: III  TM Distance: >3 FB Neck ROM: Full    Dental no notable dental hx. (+) Missing, Dental Advisory Given   Pulmonary former smoker   Pulmonary exam normal        Cardiovascular negative cardio ROS Normal cardiovascular exam     Neuro/Psych negative neurological ROS     GI/Hepatic negative GI ROS,,,(+) Cirrhosis         Endo/Other  diabetes, Type 2    Renal/GU negative Renal ROS     Musculoskeletal negative musculoskeletal ROS (+)    Abdominal   Peds  Hematology  (+) Blood dyscrasia (Thombocytopenia), anemia Protein S deficiency  Protein C deficiency   Anesthesia Other Findings Day of surgery medications reviewed with the patient.  Reproductive/Obstetrics                             Anesthesia Physical Anesthesia Plan  ASA: 3  Anesthesia Plan: MAC   Post-op Pain Management: Tylenol PO (pre-op)*   Induction: Intravenous  PONV Risk Score and Plan: 2 and Treatment may vary due to age or medical condition and Midazolam  Airway Management Planned: Simple Face Mask and Natural Airway  Additional Equipment: Arterial line  Intra-op Plan:   Post-operative Plan: Extubation in OR  Informed Consent: I have reviewed the patients History and Physical, chart, labs and discussed the procedure including the risks, benefits and alternatives for the proposed anesthesia with the patient or authorized representative who has indicated his/her understanding and acceptance.     Dental advisory given  Plan Discussed with: Anesthesiologist and CRNA  Anesthesia Plan Comments: (2 IVs, a-line.)        Anesthesia Quick Evaluation

## 2023-01-30 NOTE — Progress Notes (Signed)
ANTICOAGULATION CONSULT NOTE - Follow-up Note  Pharmacy Consult for Heparin Indication: DVT   No Known Allergies  Patient Measurements: Height: 5\' 4"  (162.6 cm) Weight: 75.1 kg (165 lb 9.1 oz) IBW/kg (Calculated) : 54.7 Heparin Dosing Weight: 70kg  Vital Signs: Temp: 98.3 F (36.8 C) (02/06 0000) Temp Source: Oral (02/06 0000) BP: 112/73 (02/06 0300) Pulse Rate: 54 (02/06 0300)  Labs: Recent Labs    01/27/23 1447 01/27/23 1630 01/28/23 0002 01/28/23 0714 01/29/23 0101 01/29/23 1642 01/29/23 2004 01/30/23 0035 01/30/23 0319  HGB 14.0  --  12.2  --  12.5 13.1 12.7 12.0  --   HCT 39.4  --  34.0*  --  35.8* 39.6 36.2 34.0*  --   PLT PLATELET CLUMPS NOTED ON SMEAR, UNABLE TO ESTIMATE  --  96*  --  107* 103* 88* 86*  --   APTT  --  28  --   --   --   --   --   --   --   LABPROT  --  17.3*  --   --   --   --   --   --   --   INR  --  1.4*  --   --   --   --   --   --   --   HEPARINUNFRC  --   --  0.41   < > 0.44  --  <0.10*  --  <0.10*  CREATININE 0.80  --  0.80  --  0.64  --   --   --   --   CKTOTAL  --   --  25*  --   --   --   --   --   --    < > = values in this interval not displayed.     Estimated Creatinine Clearance: 83.5 mL/min (by C-G formula based on SCr of 0.64 mg/dL).   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 Patient reports being s/p IVC filter. Patient is presenting with leg pain and swelling. Patient is on warfarin prior to arrival but has stopped x 5 days for gum surgery. Per patient takes 6mg  daily prior to that. Heparin per pharmacy consult placed for DVT.   She is s/p thrombectomy using alteplase - continues at 0.5 mg/hr in each catheter. Heparin drip was resumed at 400 units/hr in each catheter also so total of 800 units/hr.   Heparin still undetectable at 600 units/hr per sheath. No bleeding issue per Rn. We will increase and check another level.   Goal of Therapy:  Heparin level  0.2-0.5 units/ml Monitor platelets by anticoagulation protocol: Yes   Plan:  Increase heparin infusion to 700 units/hr in each catheter (1400 units/hr total) 6 hr heparin level F/u plan for alteplase  Onnie Boer, PharmD, BCIDP, AAHIVP, CPP Infectious Disease Pharmacist 01/30/2023 4:48 AM

## 2023-01-30 NOTE — Inpatient Diabetes Management (Signed)
Inpatient Diabetes Program Recommendations  AACE/ADA: New Consensus Statement on Inpatient Glycemic Control (2015)  Target Ranges:  Prepandial:   less than 140 mg/dL      Peak postprandial:   less than 180 mg/dL (1-2 hours)      Critically ill patients:  140 - 180 mg/dL   Lab Results  Component Value Date   GLUCAP 173 (H) 01/30/2023   HGBA1C 11.2 (H) 01/28/2023    Review of Glycemic Control  Diabetes history: DM2 Outpatient Diabetes medications: No DM meds listed Current orders for Inpatient glycemic control: Novolog 0-20 units q 4 hrs.  Inpatient Diabetes Program Recommendations:   Please consider: -  starting Semglee 15 units  Will see pt today and teach insulin.  Thanks,  Tama Headings RN, MSN, BC-ADM Inpatient Diabetes Coordinator Team Pager [phone] (8a-5p)

## 2023-01-30 NOTE — H&P (View-Only) (Signed)
  Daily Progress Note  Dx: Iliocaval DVT extending into the right leg, currently undergoing thrombolysis.  Subjective: Leg softer  Objective: Vitals:   01/30/23 0630 01/30/23 0700  BP: (!) 107/58 115/60  Pulse: (!) 57 (!) 59  Resp: 19 18  Temp:  98.1 F (36.7 C)  SpO2: 93% 94%    Physical Examination Palpable pulses in the feet bilaterally Less edema in the right lower extremity Sensorimotor intact Nonlabored breathing Regular rate, normal rhythm   ASSESSMENT/PLAN:  Patient is a 52 year old female with known protein C, protein S deficiency, recently taken off of her warfarin for a dental procedure.  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  The thrombus propagates down both iliac veins, and continues into the right lower extremity.  I had a long conversation with her regarding the above.  Being that she is symptomatic, she would benefit from percutaneous mechanical thrombectomy.  The filter has been in place since 2005, and intimal hyperplasia has likely occurred.  I am unsure as to whether this is the nidus that she has previously undergone dental work without anticoagulation and had no issues.  Plan for lytic cathter recheck today    Shelby Santee MD [facility] [phone] 01/30/2023  9:48 AM

## 2023-01-30 NOTE — Progress Notes (Signed)
  Daily Progress Note  Dx: Iliocaval DVT extending into the right leg, currently undergoing thrombolysis.  Subjective: Leg softer  Objective: Vitals:   01/30/23 0630 01/30/23 0700  BP: (!) 107/58 115/60  Pulse: (!) 57 (!) 59  Resp: 19 18  Temp:  98.1 F (36.7 C)  SpO2: 93% 94%    Physical Examination Palpable pulses in the feet bilaterally Less edema in the right lower extremity Sensorimotor intact Nonlabored breathing Regular rate, normal rhythm   ASSESSMENT/PLAN:  Patient is a 52-year-old female with known protein C, protein S deficiency, recently taken off of her warfarin for a dental procedure.  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  The thrombus propagates down both iliac veins, and continues into the right lower extremity.  I had a long conversation with her regarding the above.  Being that she is symptomatic, she would benefit from percutaneous mechanical thrombectomy.  The filter has been in place since 2005, and intimal hyperplasia has likely occurred.  I am unsure as to whether this is the nidus that she has previously undergone dental work without anticoagulation and had no issues.  Plan for lytic cathter recheck today    J. Eli Cleburn Maiolo MD [facility] [phone] 01/30/2023  9:48 AM  

## 2023-01-30 NOTE — Progress Notes (Signed)
PROGRESS NOTE Shelby Conner  XKG:[id number] DOB: Feb 14, 1971 DOA: 01/27/2023 PCP: Laverle Hobby, NP   Brief Narrative/Hospital Course: 52 y.o. female with medical history significant of protein s and C deficiency, liver cirrhoses and DM2 who presented with right lower extremity edema that began 3 days PTA.  Her chronic Coumadin was held for dental procedure which she have done in the past.  In the ED underwent further workup: CT abdomen pelvis revealed: hepatic cirrhosis with portal venous hypertension including multiple upper abdominal and esophageal varices as well as mild splenic enlargement.  Inferior vena caval filter is in place with probable venous thrombosis below this level and extending into the pelvic veins. Infiltration in the right iliopsoas region and soft tissues around the right hip likely indicate edema.  Vascular surgery was consulted for thrombolytic.  Placed on heparin drip and admitted.     Subjective: Seen and examined this morning  No new complaints. Saw vascular surgery earlier.  Assessment and Plan: Principal Problem:   DVT (deep venous thrombosis) (HCC) Active Problems:   Protein S deficiency (HCC)   Protein C deficiency (HCC)   Type 2 diabetes mellitus without complication (HCC)   History of cirrhosis   Hyponatremia  Acute DVT of inferior vena cava up to previously placed IVC filter Protein C protein S deficiency on Coumadin-recently taken off for dental procedure IVC filter in place: She has thrombosed her IVC after previously placed IVC filter, thrombus propagates down both iliac veins and continues into the right lower extremity.   On lytics from bilateral popliteal veins,  Dr. Shirlean Mylar following-noted plan for likely catheter recheck today.  Continue heparin infusion, resume Coumadin once okay with vascular.  Continue aspirin 81, statin.    Hypokalemia Hypomagnesemia: Replete potassium and mag/recheck. Recent Labs  Lab 01/27/23 1447  01/28/23 0002 01/29/23 0101 01/30/23 0528  K 4.9 3.5 3.5 3.0*  CALCIUM 9.5 8.3* 8.3* 7.8*  MG  --  1.5* 1.7 1.6*  PHOS  --  3.7  --   --     T2DM with hyperglycemia: A1c 10.2 DM continue to consulted.  Not on OHA.  Currently on SSI- consider insulin long acting bedtime, as hba1c is uncontrolled Recent Labs  Lab 01/28/23 0002 01/28/23 0020 01/29/23 2004 01/29/23 2352 01/30/23 0317 01/30/23 0806 01/30/23 1146  GLUCAP  --    < > 139* 167* 210* 173* 147*  HGBA1C 11.2*  --   --   --   --   --   --    < > = values in this interval not displayed.    History of cirrhosis with portal venous hypertension with abdominal and esophageal varices/splenic enlargement: compensated no longer drinking Hyponatremia improved TSH slightly up recheck outpatient  DVT prophylaxis: Heparin infusion Code Status:   Code Status: Full Code Family Communication: plan of care discussed with patient at bedside. Patient status is: Inpatient because of acute DVT needing thrombolytics Level of care: ICU   Dispo: The patient is from: home            Anticipated disposition: home >2 days once cleared by vascular  Objective: Vitals last 24 hrs: Vitals:   01/30/23 0600 01/30/23 0630 01/30/23 0700 01/30/23 1147  BP: 135/72 (!) 107/58 115/60   Pulse: 60 (!) 57 (!) 59   Resp: 14 19 18    Temp:   98.1 F (36.7 C) 98.1 F (36.7 C)  TempSrc:   Oral Oral  SpO2: (!) 89% 93% 94%   Weight:  Height:       Weight change:   Physical Examination: General exam: alert awake, older than stated age HEENT:Oral mucosa moist, Ear/Nose WNL grossly Respiratory system: bilaterally clear BS, no use of accessory muscle Cardiovascular system: S1 & S2 +, No JVD. Gastrointestinal system: Abdomen soft,NT,ND, BS+ Nervous System:Alert, awake, moving extremities. Extremities: LE edema neg,distal peripheral pulses palpable.  Skin: No rashes,no icterus. MSK: Normal muscle bulk,tone, power  Medications reviewed:  Scheduled  Meds:  aspirin EC  81 mg Oral Daily   Chlorhexidine Gluconate Cloth  6 each Topical Q0600   Chlorhexidine Gluconate Cloth  6 each Topical Daily   insulin aspart  0-20 Units Subcutaneous Q4H   mupirocin ointment  1 Application Nasal BID   potassium chloride  20 mEq Oral Once   sodium chloride flush  3 mL Intravenous Q12H   Continuous Infusions:  sodium chloride     sodium chloride 100 mL/hr at 01/30/23 0605   alteplase (LIMB ISCHEMIA) 10 mg in normal saline (0.02 mg/mL) infusion 0.5 mg/hr (01/30/23 0952)   alteplase (LIMB ISCHEMIA) 10 mg in normal saline (0.02 mg/mL) infusion 0.5 mg/hr (01/30/23 0600)   heparin 700 Units/hr (01/30/23 0600)   heparin 700 Units/hr (01/30/23 0600)   potassium chloride 10 mEq (01/30/23 0948)    Diet Order             Diet NPO time specified  Diet effective now                  Intake/Output Summary (Last 24 hours) at 01/30/2023 1155 Last data filed at 01/30/2023 0600 Gross per 24 hour  Intake 1278.95 ml  Output 1355 ml  Net -76.05 ml   Net IO Since Admission: 2,019.57 mL [01/30/23 1155]  Wt Readings from Last 3 Encounters:  01/27/23 75.1 kg  07/24/22 72.6 kg  07/13/21 59 kg     Unresulted Labs (From admission, onward)     Start     Ordered   01/31/23 0500  Heparin level (unfractionated)  Daily,   R     Question:  Specimen collection method  Answer:  Lab=Lab collect   01/29/23 2125   01/30/23 0528  Heparin level (unfractionated)  Once,   TIMED        01/30/23 0528   01/29/23 0500  CBC  Daily,   R      01/28/23 2155   01/27/23 1857  Creatinine, urine, random  Once,   R        01/27/23 1856   01/27/23 1857  Osmolality, urine  Once,   R        01/27/23 1856   01/27/23 1857  Urinalysis, Complete w Microscopic -Urine, Clean Catch  Once,   R       Question Answer Comment  Release to patient Immediate   Specimen Source Urine, Clean Catch      01/27/23 1856   01/27/23 1843  Resp panel by RT-PCR (RSV, Flu A&B, Covid) Anterior Nasal Swab   (Tier 2 - SymptomaticResp panel by RT-PCR (RSV, Flu A&B, Covid))  Once,   URGENT        01/27/23 1842          Data Reviewed: I have personally reviewed following labs and imaging studies CBC: Recent Labs  Lab 01/27/23 1447 01/28/23 0002 01/29/23 0101 01/29/23 1642 01/29/23 2004 01/30/23 0035 01/30/23 0528  WBC 10.2   < > 7.1 8.4 9.2 6.9 5.8  NEUTROABS 6.6  --   --   --   --   --   --  HGB 14.0   < > 12.5 13.1 12.7 12.0 12.0  HCT 39.4   < > 35.8* 39.6 36.2 34.0* 34.7*  MCV 101.0*   < > 99.2 104.2* 100.3* 101.5* 102.1*  PLT PLATELET CLUMPS NOTED ON SMEAR, UNABLE TO ESTIMATE   < > 107* 103* 88* 86* 91*   < > = values in this interval not displayed.   Basic Metabolic Panel: Recent Labs  Lab 01/27/23 1447 01/28/23 0002 01/29/23 0101 01/30/23 0528  NA 127* 128* 132* 135  K 4.9 3.5 3.5 3.0*  CL 91* 95* 100 102  CO2 24 22 24  21*  GLUCOSE 374* 378* 241* 178*  BUN 12 12 12  5*  CREATININE 0.80 0.80 0.64 0.56  CALCIUM 9.5 8.3* 8.3* 7.8*  MG  --  1.5* 1.7 1.6*  PHOS  --  3.7  --   --    GFR: Estimated Creatinine Clearance: 83.5 mL/min (by C-G formula based on SCr of 0.56 mg/dL). Liver Function Tests: Recent Labs  Lab 01/28/23 0002  AST 20  ALT 18  ALKPHOS 94  BILITOT 1.2  PROT 6.5  ALBUMIN 3.1*   Recent Labs  Lab 01/27/23 1630  INR 1.4*   Recent Labs    01/28/23 0002  HGBA1C 11.2*   CBG: Recent Labs  Lab 01/29/23 2004 01/29/23 2352 01/30/23 0317 01/30/23 0806 01/30/23 1146  GLUCAP 139* 167* 210* 173* 147*   Recent Labs    01/28/23 0002 01/28/23 0714  TSH 12.070*  --   FREET4  --  0.84   Sepsis Labs: No results for input(s): "PROCALCITON", "LATICACIDVEN" in the last 168 hours.  Recent Results (from the past 240 hour(s))  Surgical pcr screen     Status: Abnormal   Collection Time: 01/29/23 10:08 AM   Specimen: Nasal Mucosa; Nasal Swab  Result Value Ref Range Status   MRSA, PCR NEGATIVE NEGATIVE Final   Staphylococcus aureus POSITIVE (A)  NEGATIVE Final    Comment: (NOTE) The Xpert SA Assay (FDA approved for NASAL specimens in patients 19 years of age and older), is one component of a comprehensive surveillance program. It is not intended to diagnose infection nor to guide or monitor treatment. Performed at [facility], [address] [place], [address] [place]     Antimicrobials: Anti-infectives (From admission, onward)    Start     Dose/Rate Route Frequency Ordered Stop   01/29/23 2000  ceFAZolin (ANCEF) injection 1 g  Status:  Discontinued        1 g Intramuscular Every 8 hours 01/29/23 1205 01/29/23 1508   01/29/23 2000  ceFAZolin (ANCEF) IVPB 1 g/50 mL premix        1 g 100 mL/hr over 30 Minutes Intravenous Every 8 hours 01/29/23 1508 01/30/23 0452   01/29/23 1215  ceFAZolin (ANCEF) IVPB 2g/100 mL premix        2 g 200 mL/hr over 30 Minutes Intravenous  Once 01/29/23 1209 01/29/23 1225   01/29/23 1209  ceFAZolin (ANCEF) 2-4 GM/100ML-% IVPB       Note to Pharmacy: 03/30/23: cabinet override      01/29/23 1209 01/29/23 1251      Culture/Microbiology    Component Value Date/Time   SDES URINE, CLEAN CATCH 12/05/2020 1040   SPECREQUEST  12/05/2020 1040    NONE Performed at [facility], [address], [place], [address] [place]    CULT MULTIPLE SPECIES PRESENT, SUGGEST RECOLLECTION (A) 12/05/2020 1040   REPTSTATUS 12/06/2020 FINAL  12/05/2020 1040  Other culture-see note  Radiology Studies: HYBRID OR IMAGING ([place])  Result Date: 01/29/2023 There is no interpretation for this exam.  This order is for images obtained during a surgical procedure.  Please See "Surgeries" Tab for more information regarding the procedure.     LOS: 2 days   Antonieta Pert, MD [facility]  01/30/2023, 11:55 AM

## 2023-01-30 NOTE — Op Note (Signed)
Patient name: Shelby Conner MRN: [id number] DOB: 1971-02-04 Sex: female  01/30/2023 Pre-operative Diagnosis: Occluded IVC filter with extensive right lower extremity DVT, occluded left common iliac vein stent Post-operative diagnosis:  Same Surgeon:  Eda Paschal. Donzetta Matters, MD Co-surgeon: Annamarie Major, MD Procedure Performed: 1.  Ultrasound-guided cannulation right internal jugular vein and right femoral vein 2.  Lytic recheck with bilateral lower extremity and central venography 3.  Removal of trapeze IVC filter via snare and bronchoscopy forcep 4.  Intravascular ultrasound IVC, bilateral common and bilateral external iliac veins 5.  Stent of vena cava with 20 x 80 mm venous Wallstent 6.  Balloon angioplasty of right common and external iliac veins and left common and external iliac vein stent with 16 mm Atlas 7.  Percutaneous mechanical thrombectomy right external and common iliac veins and IVC using CAT 16 penumbra   Indications: 52 year old female with a history of protein C and protein S deficiencies recently discontinued warfarin for a dental procedure.  Patient has a known IVC filter that was placed in approximately 2005 and a left common iliac vein stent that was placed in approximately 2008.  She now has thrombus from her IVC all the way down through her right lower extremity she also has an occluded left common iliac vein stent but she is asymptomatic on the left side.  She has undergone placement of percutaneous lytic catheters the day prior to this procedure and remains very symptomatic on the right lower extremity is now indicated for lytic recheck with filter removal and possible stenting.  Findings: The filter remained fully occluded both using intravascular ultrasound and by venogram.  Most of the thrombus in the right lower extremity was cleared by lytics we did perform mechanical thrombectomy of the filter as well as the right common and external iliac veins both before and  after removal of the filter.  The filter was removed piecemeal using snare technique from the right IJ and right femoral veins as well as bronchoscopy forceps from the right femoral vein and most of the filter was removed although 1 small tine of the filter that had embolized to the left lung patient was hemodynamically stable during this part of the procedure.  After removal of the IVC filter that he cava had significant chronic thrombosis and was difficult to even pass an IVUS catheter.  We ballooned the cava initially with 16 mm balloon but the chronic thrombosis was persistent.  For this reason the IVC was stented.  After mechanical thrombectomy and balloon angioplasty of the right common and external neck veins these were patent and after ballooning the vena cava stent this was patent with brisk flow all the way to the right atrium and the suprarenal vena cava was more robust than previous.  The left common iliac vein stent was primarily ballooned with a 16 mm Atlas balloon and this was then noted to be patent by venography and given that she was asymptomatic no further intervention was performed.   Procedure:  The patient was identified in the holding area and taken to the operating room where she was placed upon operative when general anesthesia was induced.  She was sterilely prepped and draped in the bilateral lower extremities and bilateral neck in the usual fashion, antibiotics were administered timeout was called.  I began with it by exchanging the right popliteal sheath for an 8 French sheath.  Patient was then fully heparinized and maintained heparinization throughout the case by checking ACT's.  I rewired the  catheter placed a Rosen wire and then performed intravascular ultrasound which demonstrated occlusion of the cava and the filter.  With this I then used ultrasound to cannulate the right internal jugular vein with a micropuncture needle followed by wire and sheath and I then placed a stiff wire  into the IVC and placed initially an 18 French sheath down to the level of the filter.  Penumbra percutaneous mechanical thrombectomy with a CAT 16 device was then performed from the IJ sheath and we did clear we thought was all the clot on top of the filter and down into the filter.  We were then able to snare a wire around the filter and then pulled tension with the 18 sheath but the sheath would not reach.  I then used multiple catheters and wires from below to get access around the filter from there and snare wires but I could not get any of the retrieval sheaths to pass over the filter.  With this I then cannulated the femoral vein using ultrasound guidance and ultimately upsized to a 26 French sheath.  The neck sheath was also upsized to 26 [place] over a stiff wire.  We were then able to snare up and over both sides of the filter and pulled the filter mostly straight and approximately two thirds of the filter was removed through the inferior 26 [place] sheath.  I was then able to use bronchoscopy forceps and grabbed the rest of the filter and remove this until there was no further filter remaining in the IVC.  Intravascular ultrasound demonstrated no further filter but the cava was subtotally occluded there.  We did use fluoroscopy identified what appeared to be 1 small tine of the filter in the left lung that had obviously embolized but the patient was asymptomatic from this.  The penumbra was then used from below and percutaneous mechanical thrombectomy was performed of the right external, right common iliac veins and the IVC.  We then began ballooning from below with the first a 12 followed by 16 mm balloon in the cava and down into the right common and external iliac veins.  Completion of vascular ultrasound demonstrated what appeared to be subtotally occluded cava that was recalcitrant to balloon angioplasty.  For this reason we elected to stent with a 20 x 80 mm Wallstent and this was postdilated  inferiorly with a 16 mm balloon and more cephalad with an Gore MOB balloon.  Completion intravascular ultrasound demonstrated good apposition to the walls and right lower extremity and central venography demonstrated patency of the right common and external neck veins with brisk flow through the cava.  We then returned our attention to the left lower extremity.  There was a 6 [place] sheath in place and the lytic catheter had been removed over a Rosen wire.  We then placed a Glidewire advantage up through the left common iliac vein stent and into the IVC.  We used IVUS to confirm this was within the stents.  We then performed balloon angioplasty with 16 mm balloon.  Completion venography demonstrated patency of the stent we elected no further intervention.  All catheters and wires were removed and we placed suture bolsters at the right neck IJ cannulation site, right femoral vein cannulation site and bilateral popliteal cannulation sites.  The patient was then awakened from anesthesia having tolerated procedure without immediate complication.  All counts were correct at completion.  This procedure required a co-surgeon given the complexity and multiple access points.  Specifically  this required wire and catheter exchange as well as narrowing of wire from the right IJ, right femoral vein and bilateral popliteal veins.  This also required significant experience to perform balloon angioplasty and stenting of the IVC, the bilateral common and external iliac veins.  EBL: 800cc  Transfusion: 2 units packed red blood cells.  Abbagail Scaff C. Donzetta Matters, MD [facility] [place] Office: [phone] Pager: [phone]

## 2023-01-30 NOTE — Anesthesia Procedure Notes (Addendum)
Procedure Name: Intubation Date/Time: 01/30/2023 2:34 PM  Performed by: Inda Coke, CRNAPre-anesthesia Checklist: Patient identified, Emergency Drugs available, Suction available, Timeout performed and Patient being monitored Patient Re-evaluated:Patient Re-evaluated prior to induction Oxygen Delivery Method: Circle system utilized Preoxygenation: Pre-oxygenation with 100% oxygen Induction Type: IV induction Ventilation: Mask ventilation without difficulty Laryngoscope Size: Mac and 3 Grade View: Grade I Tube type: Oral Tube size: 7.5 mm Number of attempts: 1 Airway Equipment and Method: Stylet Placement Confirmation: ETT inserted through vocal cords under direct vision, positive ETCO2, CO2 detector and breath sounds checked- equal and bilateral Secured at: 22 cm Tube secured with: Tape Dental Injury: Teeth and Oropharynx as per pre-operative assessment

## 2023-01-30 NOTE — Hospital Course (Addendum)
52 y.o. 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  Her chronic Coumadin was held for dental procedure 2 and half week go and inr was as low as 1.4, she had done that in the past.  In the ED underwent further workup: CT abdomen pelvis revealed: hepatic cirrhosis with portal venous hypertension including multiple upper abdominal and esophageal varices as well as mild splenic enlargement.  Inferior vena caval filter is in place with probable venous thrombosis below this level and extending into the pelvic veins. Infiltration in the right iliopsoas region and soft tissues around the right hip likely indicate edema. Vascular surgery was consulted  She was admitted to ICU on heparin drip S/P pharmacomechanical thrombectomy with filter removal and ivc stent placement, coumadin resumed. She needed multiple prbc transfusions for ABLA. Patient was complaining of severe right hip pain on 2/10 and CT scan revealed-large right retroperitoneal hematoma> her heparin was initially held for 4 hours then continued but overnight 2/10-11 continues to have drop in hemoglobin and worsening pain.  Vascular again held heparin, will need PRBC ordered, heparin resumed 2/12 am

## 2023-01-30 NOTE — Anesthesia Procedure Notes (Signed)
Arterial Line Insertion Start/End2/05/2023 2:38 PM, 01/30/2023 2:40 PM Performed by: Deliah Boston, CRNA, CRNA  Patient location: OR. Preanesthetic checklist: patient identified, IV checked, site marked, risks and benefits discussed, surgical consent, monitors and equipment checked, pre-op evaluation, timeout performed and anesthesia consent Patient sedated Left, radial was placed Catheter size: 20 G Hand hygiene performed  and maximum sterile barriers used  Allen's test indicative of satisfactory collateral circulation Attempts: 1 Procedure performed without using ultrasound guided technique. Following insertion, dressing applied and Biopatch. Post procedure assessment: normal  Patient tolerated the procedure well with no immediate complications.

## 2023-01-30 NOTE — Interval H&P Note (Signed)
History and Physical Interval Note:  01/30/2023 12:53 PM  Shelby Conner  has presented today for surgery, with the diagnosis of Ileocecal DVT.  The various methods of treatment have been discussed with the patient and family. After consideration of risks, benefits and other options for treatment, the patient has consented to  Procedure(s): PERCUTANEOUS THROMBECTOMY (Bilateral), filter retrieval, possible stent as possible filter placement as a surgical intervention.  The patient's history has been reviewed, patient examined, no change in status, stable for surgery.  I have reviewed the patient's chart and labs.  Questions were answered to the patient's satisfaction.     Servando Snare

## 2023-01-30 NOTE — Progress Notes (Signed)
Patient c/o difficulty breathing.  CRNA at bedside.  MDA notified.  MDA at bedside.  Lungs clear.  MDA aware of CBG 197.  OK to just let her restart sliding scale once she returns to ICU.

## 2023-01-30 NOTE — Progress Notes (Signed)
MDA notified cuff BP 41'D systolic.  A line reading 110.  MDA wants to go by a line reading.

## 2023-01-30 NOTE — Inpatient Diabetes Management (Signed)
Inpatient Diabetes Program Recommendations  AACE/ADA: New Consensus Statement on Inpatient Glycemic Control (2015)  Target Ranges:  Prepandial:   less than 140 mg/dL      Peak postprandial:   less than 180 mg/dL (1-2 hours)      Critically ill patients:  140 - 180 mg/dL   Lab Results  Component Value Date   GLUCAP 85 01/30/2023   HGBA1C 11.2 (H) 01/28/2023    Review of Glycemic Control  Latest Reference Range & Units 01/29/23 06:25 01/29/23 10:24 01/29/23 13:37 01/29/23 17:03 01/29/23 20:04 01/29/23 23:52 01/30/23 03:17 01/30/23 08:06 01/30/23 11:46 01/30/23 14:01  Glucose-Capillary 70 - 99 mg/dL 225 (H) 200 (H) 166 (H) 149 (H) 139 (H) 167 (H) 210 (H) 173 (H) 147 (H) 85   Diabetes history: DM 2 Outpatient Diabetes medications: Metformin 500 mg bid in the past and an injectable that pt cannot remember the name Current orders for Inpatient glycemic control:  Semglee 5 units Qhs Novolog 0-20 units Q4 hours  A1c 11.2% on 2/4  Spoke briefly at bedside with pt regarding A1c level of 11.2% this admission and glucose control at home. Pt reports she was on an injectable in the past and insulin twice a day. She was able to come off the medication and manage through lifestyle. Pt also reports gaining some weight within the last few months. Pt reporting 6 months ago her PCP sad she was back in Pre Diabetes range and last week her PCP told her that the A1c was 11%. She was to have an appointment this week to discuss medication options for outpt.   We discussed options for medications for glycemic control. Discussed with pt that insulin maybe the best option to get her levels down to goal immediately when dosed correctly. Also mentioned for pt to be placed back on metformin. Pt reports not having side effects of metformin.   Dr. Virl Cagey came into the room to discuss with pt to go to the OR. Will continue education and review insulin injections tomorrow 2/7.   Thanks,  Tama Headings RN, MSN,  BC-ADM Inpatient Diabetes Coordinator Team Pager [phone] (8a-5p)

## 2023-01-31 ENCOUNTER — Other Ambulatory Visit (HOSPITAL_COMMUNITY): Payer: Self-pay

## 2023-01-31 DIAGNOSIS — I82491 Acute embolism and thrombosis of other specified deep vein of right lower extremity: Secondary | ICD-10-CM | POA: Diagnosis not present

## 2023-01-31 LAB — CBC
HCT: 22.5 % — ABNORMAL LOW (ref 36.0–46.0)
HCT: 21.7 % — ABNORMAL LOW (ref 36.0–46.0)
HCT: 23.6 % — ABNORMAL LOW (ref 36.0–46.0)
Hemoglobin: 8 g/dL — ABNORMAL LOW (ref 12.0–15.0)
Hemoglobin: 7.4 g/dL — ABNORMAL LOW (ref 12.0–15.0)
Hemoglobin: 8.6 g/dL — ABNORMAL LOW (ref 12.0–15.0)
MCH: 34.2 pg — ABNORMAL HIGH (ref 26.0–34.0)
MCH: 33.5 pg (ref 26.0–34.0)
MCH: 34.1 pg — ABNORMAL HIGH (ref 26.0–34.0)
MCHC: 35.6 g/dL (ref 30.0–36.0)
MCHC: 34.1 g/dL (ref 30.0–36.0)
MCHC: 36.4 g/dL — ABNORMAL HIGH (ref 30.0–36.0)
MCV: 96.2 fL (ref 80.0–100.0)
MCV: 98.2 fL (ref 80.0–100.0)
MCV: 93.7 fL (ref 80.0–100.0)
Platelets: 92 10*3/uL — ABNORMAL LOW (ref 150–400)
Platelets: 91 10*3/uL — ABNORMAL LOW (ref 150–400)
Platelets: 81 10*3/uL — ABNORMAL LOW (ref 150–400)
RBC: 2.34 MIL/uL — ABNORMAL LOW (ref 3.87–5.11)
RBC: 2.21 MIL/uL — ABNORMAL LOW (ref 3.87–5.11)
RBC: 2.52 MIL/uL — ABNORMAL LOW (ref 3.87–5.11)
RDW: 15 % (ref 11.5–15.5)
RDW: 14.9 % (ref 11.5–15.5)
RDW: 15.4 % (ref 11.5–15.5)
WBC: 13.6 10*3/uL — ABNORMAL HIGH (ref 4.0–10.5)
WBC: 13 10*3/uL — ABNORMAL HIGH (ref 4.0–10.5)
WBC: 9.7 10*3/uL (ref 4.0–10.5)
nRBC: 0 % (ref 0.0–0.2)
nRBC: 0 % (ref 0.0–0.2)
nRBC: 0 % (ref 0.0–0.2)

## 2023-01-31 LAB — BASIC METABOLIC PANEL
Anion gap: 10 (ref 5–15)
BUN: 6 mg/dL (ref 6–20)
CO2: 19 mmol/L — ABNORMAL LOW (ref 22–32)
Calcium: 7 mg/dL — ABNORMAL LOW (ref 8.9–10.3)
Chloride: 104 mmol/L (ref 98–111)
Creatinine, Ser: 0.62 mg/dL (ref 0.44–1.00)
GFR, Estimated: >60 mL/min (ref >60)
Glucose, Bld: 181 mg/dL — ABNORMAL HIGH (ref 70–99)
Potassium: 3.7 mmol/L (ref 3.5–5.1)
Sodium: 133 mmol/L — ABNORMAL LOW (ref 135–145)

## 2023-01-31 LAB — GLUCOSE, CAPILLARY
Glucose-Capillary: 186 mg/dL — ABNORMAL HIGH (ref 70–99)
Glucose-Capillary: 186 mg/dL — ABNORMAL HIGH (ref 70–99)
Glucose-Capillary: 162 mg/dL — ABNORMAL HIGH (ref 70–99)
Glucose-Capillary: 208 mg/dL — ABNORMAL HIGH (ref 70–99)
Glucose-Capillary: 183 mg/dL — ABNORMAL HIGH (ref 70–99)
Glucose-Capillary: 162 mg/dL — ABNORMAL HIGH (ref 70–99)

## 2023-01-31 LAB — HEPARIN LEVEL (UNFRACTIONATED)
Heparin Unfractionated: 0.39 IU/mL (ref 0.30–0.70)
Heparin Unfractionated: 0.43 IU/mL (ref 0.30–0.70)

## 2023-01-31 LAB — PROTIME-INR
INR: 1.7 — ABNORMAL HIGH (ref 0.8–1.2)
Prothrombin Time: 20 seconds — ABNORMAL HIGH (ref 11.4–15.2)

## 2023-01-31 LAB — PREPARE RBC (CROSSMATCH)

## 2023-01-31 MED ORDER — WARFARIN SODIUM 3 MG PO TABS
6.0000 mg | ORAL_TABLET | Freq: Once | ORAL | Status: AC
  Administered 2023-01-31: 6 mg via ORAL
  Filled 2023-01-31: qty 2

## 2023-01-31 MED ORDER — WARFARIN SODIUM 5 MG PO TABS
10.0000 mg | ORAL_TABLET | ORAL | Status: AC
  Administered 2023-01-31: 10 mg via ORAL
  Filled 2023-01-31: qty 2

## 2023-01-31 MED ORDER — SODIUM CHLORIDE 0.9% IV SOLUTION: Freq: Once | INTRAVENOUS | Status: DC

## 2023-01-31 MED ORDER — WARFARIN - PHARMACIST DOSING INPATIENT: Freq: Every day | Status: DC

## 2023-01-31 MED ORDER — LIVING WELL WITH DIABETES BOOK
Freq: Once | Status: AC
  Administered 2023-02-02: 1
  Filled 2023-01-31: qty 1

## 2023-01-31 MED ORDER — HEPARIN (PORCINE) 25000 UT/250ML-% IV SOLN
1200.0000 [IU]/h | INTRAVENOUS | Status: DC
  Administered 2023-01-31 – 2023-02-02 (×4): 1500 [IU]/h via INTRAVENOUS
  Administered 2023-02-02 – 2023-02-03 (×2): 1700 [IU]/h via INTRAVENOUS
  Filled 2023-01-31 (×6): qty 250

## 2023-01-31 MED ORDER — LORAZEPAM 0.5 MG PO TABS
0.5000 mg | ORAL_TABLET | Freq: Two times a day (BID) | ORAL | Status: DC | PRN
  Administered 2023-01-31: 0.5 mg via ORAL
  Filled 2023-01-31: qty 1

## 2023-01-31 NOTE — Progress Notes (Addendum)
ANTICOAGULATION CONSULT NOTE - Follow-up Note  Pharmacy Consult for Heparin/coumadin Indication: DVT/protein S and C deficiency  No Known Allergies  Patient Measurements: Height: 5\' 4"  (162.6 cm) Weight: 75.1 kg (165 lb 9.1 oz) IBW/kg (Calculated) : 54.7 Heparin Dosing Weight: 70kg  Vital Signs: Temp: 98.6 F (37 C) (02/07 0700) Temp Source: Oral (02/07 0700) BP: 130/68 (02/07 1000) Pulse Rate: 83 (02/07 1000)  Labs: Recent Labs    01/29/23 2004 01/30/23 0035 01/30/23 0319 01/30/23 0528 01/30/23 1842 01/30/23 1946 01/30/23 2042 01/31/23 0620 01/31/23 0749 01/31/23 0810  HGB 12.7 12.0  --  12.0   < > 8.8* 9.5*  --  8.0*  --   HCT 36.2 34.0*  --  34.7*   < > 26.0* 28.0*  --  22.5*  --   PLT 88* 86*  --  91*  --   --   --   --  92*  --   LABPROT  --   --   --   --   --   --   --  20.0*  --   --   INR  --   --   --   --   --   --   --  1.7*  --   --   HEPARINUNFRC <0.10*  --  <0.10*  --   --   --   --  0.39  --   --   CREATININE  --   --   --  0.56  --  0.30*  --   --   --  0.62   < > = values in this interval not displayed.     Estimated Creatinine Clearance: 83.5 mL/min (by C-G formula based on SCr of 0.62 mg/dL).   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 Patient reports being s/p IVC filter. Patient is presenting with leg pain and swelling. Patient is on warfarin prior to arrival but has stopped x 5 days for gum surgery. Per patient takes 6mg  daily prior to that. Heparin per pharmacy consult placed for DVT.   Pt is s/o tPA and thrombectomy. Heparin and coumadin restarted 2/6 post procedure.  INR= 1.7, heparin level= 0.39 on 1500 units/hr  PTA coumadin 6mg  PO qday   Goal of Therapy:  Heparin level 0.3-0.7 units/ml INR 2-3 Monitor platelets by anticoagulation protocol: Yes   Plan:  Continue heparin 1500 units/hr Confirm heparin level later today Coumadin 6mg  PO x1 Daily INR  Hildred Laser,  PharmD Clinical Pharmacist **Pharmacist phone directory can now be found on [email or web address] ([email or web address]).  Listed under [place].  Addendum -heparin level= 0.43 and confirmed at goal  Plan -Continue heparin  at 1500 units/hr -Recheck in am  Hildred Laser, PharmD Clinical Pharmacist **Pharmacist phone directory can now be found on [email or web address] ([email or web address]).  Listed under [place].

## 2023-01-31 NOTE — Inpatient Diabetes Management (Addendum)
Inpatient Diabetes Program Recommendations  AACE/ADA: New Consensus Statement on Inpatient Glycemic Control (2015)  Target Ranges:  Prepandial:   less than 140 mg/dL      Peak postprandial:   less than 180 mg/dL (1-2 hours)      Critically ill patients:  140 - 180 mg/dL   Lab Results  Component Value Date   GLUCAP 186 (H) 01/31/2023   HGBA1C 11.2 (H) 01/28/2023    Review of Glycemic Control  Diabetes history: DM 2 Outpatient Diabetes medications: Metformin 500 mg bid in the past and an injectable that pt cannot remember the name Current orders for Inpatient glycemic control:  Semglee 5 units Qhs Novolog 0-20 units Q4 hours  Inpatient Diabetes Program Recommendations:   Please consider since patient is eating: -Decrease Novolog correction to 0-15 units tid, 0-5 unit hs -Increase Semglee to 8 units qd -Add carb mod to current diet order  12:00 noon Spoke with patient @ bedside regarding diabetes management. Patient states she did not think she was going to be discharged home on insulin so does not want to review @ this time. Discussed basic nutrition information and answered questions. Patient states she know what to do since she has had prediabetes and her dad has diabetes. Ordered Living well With Diabetes booklet and patient plans to review.  Thank you, Shelby Conner. Jammy Stlouis, RN, MSN, CDE  Diabetes Coordinator Inpatient Glycemic Control Team Team Pager [phone] (8am-5pm) 01/31/2023 9:23 AM

## 2023-01-31 NOTE — Progress Notes (Signed)
  Progress Note    01/31/2023 8:14 AM 1 Day Post-Op  Subjective:  emotional this morning about her current hospitalization   Vitals:   01/31/23 0600 01/31/23 0700  BP: (!) 86/56   Pulse: (!) 59   Resp: 11   Temp:  98.6 F (37 C)  SpO2: 98%    Physical Exam: Lungs:  non labored Incisions:  R IJ c/d/I; wraps of BLE left in place Extremities:  calves soft; palpable DP pulses Abdomen:  soft Neurologic: A&O  CBC    Component Value Date/Time   WBC 5.8 01/30/2023 0528   RBC 3.40 (L) 01/30/2023 0528   HGB 9.5 (L) 01/30/2023 2042   HCT 28.0 (L) 01/30/2023 2042   PLT 91 (L) 01/30/2023 0528   MCV 102.1 (H) 01/30/2023 0528   MCH 35.3 (H) 01/30/2023 0528   MCHC 34.6 01/30/2023 0528   RDW 12.7 01/30/2023 0528   LYMPHSABS 2.6 01/27/2023 1447   MONOABS 0.6 01/27/2023 1447   EOSABS 0.2 01/27/2023 1447   BASOSABS 0.1 01/27/2023 1447    BMET    Component Value Date/Time   NA 141 01/30/2023 2042   K 4.0 01/30/2023 2042   CL 107 01/30/2023 1946   CO2 21 (L) 01/30/2023 0528   GLUCOSE 189 (H) 01/30/2023 1946   BUN <3 (L) 01/30/2023 1946   CREATININE 0.30 (L) 01/30/2023 1946   CALCIUM 7.8 (L) 01/30/2023 0528   GFRNONAA >60 01/30/2023 0528   GFRAA  10/22/2009 0450    >60        The eGFR has been calculated using the MDRD equation. This calculation has not been validated in all clinical situations. eGFR's persistently <60 mL/min signify possible Chronic Kidney Disease.    INR    Component Value Date/Time   INR 1.7 (H) 01/31/2023 9702     Intake/Output Summary (Last 24 hours) at 01/31/2023 0814 Last data filed at 01/31/2023 0600 Gross per 24 hour  Intake 5551.81 ml  Output 4250 ml  Net 1301.81 ml     Assessment/Plan:  52 y.o. female is s/p  1.  Ultrasound-guided cannulation right internal jugular vein and right femoral vein 2.  Lytic recheck with bilateral lower extremity and central venography 3.  Removal of trapeze IVC filter via snare and bronchoscopy  forcep 4.  Intravascular ultrasound IVC, bilateral common and bilateral external iliac veins 5.  Stent of vena cava with 20 x 80 mm venous Wallstent 6.  Balloon angioplasty of right common and external iliac veins and left common and external iliac vein stent with 16 mm Atlas 7.  Percutaneous mechanical thrombectomy right external and common iliac veins and IVC using CAT 16 penumbra 1 Day Post-Op   Subjectively, R leg feels much better compared to before surgery; BLE well perfused Transition back to coumadin; She will need to follow up with hematology Continue compression wraps today; will transition to thigh high stockings tomorrow Prn ativan ordered Continue A line this morning; can remove later today if correlating with cuff pressures OOB   Dagoberto Ligas, PA-C [facility] [phone] 01/31/2023 8:14 AM

## 2023-01-31 NOTE — Op Note (Signed)
    Patient name: Shelby Conner MRN: [id number] DOB: 03-Aug-1971 Sex: female  01/30/2023 Pre-operative Diagnosis: DVT Post-operative diagnosis:  Same Surgeon:  Annamarie Major Co-Surgeon:  Servando Snare, MD Procedure:   1.  Ultrasound-guided cannulation right internal jugular vein and right femoral vein 2.  Lytic recheck with bilateral lower extremity and central venography 3.  Removal of trapeze IVC filter via snare and bronchoscopy forcep 4.  Intravascular ultrasound IVC, bilateral common and bilateral external iliac veins 5.  Stent of vena cava with 20 x 80 mm venous Wallstent 6.  Balloon angioplasty of right common and external iliac veins and left common and external iliac vein stent with 16 mm Atlas 7.  Percutaneous mechanical thrombectomy right external and common iliac veins and IVC using CAT 16 penumbra Anesthesia:  General Blood Loss:  See Anesthesia Record Specimens:  none  Findings: Successful removal of [place] inferior vena cava filter.  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  Indications: This is a 52 year old female with protein C&S deficiencies.  She recently discontinued anticoagulation for dental procedure and developed thrombus in her vena cava through her right leg.  She also had an occluded prior left common iliac stent.  She had bilateral lytics catheters placed by Dr. Virl Cagey yesterday and comes in today for definitive treatment.  Procedure:  The patient was identified in the holding area and taken to [facility]  The patient was then placed supine on the table. general anesthesia was administered.  The patient was prepped and draped in the usual sterile fashion.  A time out was called and antibiotics were administered.  Please see Dr. Claretha Cooper detailed operative note for the procedure.  2 surgeons were necessary for this procedure due to the complexity.  Multiple access sites were required to remove the  filter which was able to be removed piecemeal with snare catheters as well as bronchoscopy forceps.  After filter removal, additional mechanical thrombectomy of the iliac veins and vena cava were performed.  IVUS showed a residual high-grade stenosis at the site of the filter which we felt needed to be stented in order to prevent reocclusion.  This was done with a 20 x 80 Wallstent with excellent results.  The prior left common iliac vein stent was thrombectomized and reopened with a 12 mm balloon.  She had good opacification of her iliac venous system and inferior vena cava at the completion of the procedure.   Disposition: To PACU stable.   Theotis Burrow, M.D., [facility] [place] Office: [phone] Pager:  [phone]

## 2023-01-31 NOTE — Progress Notes (Signed)
PROGRESS NOTE Shelby Conner  ZOX:[id number] DOB: April 05, 1971 DOA: 01/27/2023 PCP: Laverle Hobby, NP   Brief Narrative/Hospital Course: 52 y.o. female with medical history significant of protein s and C deficiency, liver cirrhoses and DM2 who presented with right lower extremity edema that began 3 days PTA.  Her chronic Coumadin was held for dental procedure which she have done in the past.  In the ED underwent further workup: CT abdomen pelvis revealed: hepatic cirrhosis with portal venous hypertension including multiple upper abdominal and esophageal varices as well as mild splenic enlargement.  Inferior vena caval filter is in place with probable venous thrombosis below this level and extending into the pelvic veins. Infiltration in the right iliopsoas region and soft tissues around the right hip likely indicate edema.  Vascular surgery was consulted and recommended thrombolytic and admitted to ICU on heparin drip   Subjective: Patient seen and examined this morning. Family at bedside.  She feels much better Overnight afebrile.  On room air Blood pressure cuff has been reading in 80s but A line in 110s  Assessment and Plan: Principal Problem:   DVT (deep venous thrombosis) (HCC) Active Problems:   Protein S deficiency (HCC)   Protein C deficiency (HCC)   Type 2 diabetes mellitus without complication (HCC)   History of cirrhosis   Hyponatremia  Acute DVT of inferior vena cava Protein C,protein S deficiency on Coumadin IVC filter in place: She was recently taken off for dental procedure now she has thrombosed her IVC upto previously placed IVC filter, thrombus propagates down both iliac veins and continues into the right lower extremity.   Vascular surgery consulted> S/P thrombolytics.  Underwent gliotic recheck, IVC filter removal, stent or vena cava, balloon angioplasty of right common and external iliac vein and left common and external iliac vein stent, percutaneous mechanical  thrombectomy of right external and common iliac vein and IVC overnight. Continue heparin infusion, resumed Coumadin 2/7 per [facility]-pharmacy dosing, check INR daily.  Continue aspirin.   Recent Labs  Lab 01/27/23 1630 01/31/23 0620  INR 1.4* 1.7*   Thrombocytopenia:2/59f liver cirrhosis, acute DVT, also on heparin.Monitor closely while on heparin. Recent Labs  Lab 01/29/23 1642 01/29/23 2004 01/30/23 0035 01/30/23 0528 01/31/23 0749  PLT 103* 88* 86* 91* 92*    Anemia of acute blood loss: On admission 12 now w/ ABLA in the setting of thrombolytics/procedure monitor and transfuse if less than 7 g> rechecking at 2 PM. Recent Labs    01/30/23 0528 01/30/23 1842 01/30/23 1946 01/30/23 2042 01/31/23 0749  HGB 12.0 10.5* 8.8* 9.5* 8.0*  MCV 102.1*  --   --   --  96.2    Hypokalemia Hypomagnesemia: Repleted and improved. Recent Labs  Lab 01/27/23 1447 01/28/23 0002 01/29/23 0101 01/30/23 0528 01/30/23 1842 01/30/23 1946 01/30/23 2042 01/31/23 0810  K 4.9 3.5 3.5 3.0* 4.4 3.9 4.0 3.7  CALCIUM 9.5 8.3* 8.3* 7.8*  --   --   --  7.0*  MG  --  1.5* 1.7 1.6*  --   --   --   --   PHOS  --  3.7  --   --   --   --   --   --     T2DM with hyperglycemia: A1c 11.2, poorly controlled.she used to be on metformin 500 mg and injectable medication that she does not know the name.DM coordinator following.  Started long-acting insulins once taking p.o., keep on SSI.  Increase long-acting insulin slowly Recent Labs  Lab  01/28/23 0002 01/28/23 0020 01/30/23 1851 01/30/23 2239 01/30/23 2329 01/31/23 0447 01/31/23 0753  GLUCAP  --    < > 160* 197* 185* 186* 186*  HGBA1C 11.2*  --   --   --   --   --   --    < > = values in this interval not displayed.    History of cirrhosis with portal venous hypertension with abdominal and esophageal varices/splenic enlargement: Currently stable.  Last viral hepatitis panel 21 was negative.  Current MELD score as belowMELD 3.0: 16 at 01/29/2023  1:01  AM MELD-Na: 11 at 01/29/2023  1:01 AM Calculated from: Serum Creatinine: 0.64 mg/dL (Using min of 1 mg/dL) at 01/29/2023  1:01 AM Serum Sodium: 132 mmol/L at 01/29/2023  1:01 AM Total Bilirubin: 1.2 mg/dL at 01/28/2023 12:02 AM Serum Albumin: 3.1 g/dL at 01/28/2023 12:02 AM INR(ratio): 1.4 at 01/27/2023  4:30 PM Age at listing (hypothetical): 69 years Sex: Female at 01/29/2023  1:01 AM   Hyponatremia improved TSH slightly up recheck outpatient  DVT prophylaxis: Heparin infusion Code Status:   Code Status: Full Code Family Communication: plan of care discussed with patient at bedside. Patient status is: Inpatient because of acute DVT needing thrombolytics Level of care: ICU   Dispo: The patient is from: home            Anticipated disposition: home 1-2 days once cleared by vascular  Objective: Vitals last 24 hrs: Vitals:   01/31/23 0700 01/31/23 0900 01/31/23 0930 01/31/23 1000  BP:  (!) 99/53 (!) 109/54 130/68  Pulse:  67 69 83  Resp:  (!) 23 17 (!) 28  Temp: 98.6 F (37 C)     TempSrc: Oral     SpO2:  99% 97% 97%  Weight:      Height:       Weight change:   Physical Examination: General exam: AAox3, weak,older appearing HEENT:Oral mucosa moist, Ear/Nose WNL grossly, dentition normal. Respiratory system: bilaterally clear BS, no use of accessory muscle Cardiovascular system: S1 & S2 +, regular rate. Gastrointestinal system: Abdomen soft,NT,ND,BS+ Nervous System:Alert, awake, moving extremities and grossly nonfocal Extremities: LE ankle edema mild- b/l le in ace wrap, distal toes visible with good capillary refill warm Skin: No rashes,no icterus. MSK: Normal muscle bulk,tone, power   Medications reviewed:  Scheduled Meds:  sodium chloride   Intravenous Once   aspirin EC  81 mg Oral Daily   chlorhexidine  15 mL Mouth/Throat Once   Or   mouth rinse  15 mL Mouth Rinse Once   Chlorhexidine Gluconate Cloth  6 each Topical Q0600   Chlorhexidine Gluconate Cloth  6 each Topical  Daily   insulin aspart  0-20 Units Subcutaneous Q4H   insulin glargine-yfgn  5 Units Subcutaneous QHS   mupirocin ointment  1 Application Nasal BID   Warfarin - Pharmacist Dosing Inpatient   Does not apply q1600   Continuous Infusions:  sodium chloride 100 mL/hr at 01/31/23 0600    ceFAZolin (ANCEF) IV 100 mL/hr at 01/31/23 0600   heparin 1,500 Units/hr (01/31/23 0600)   lactated ringers      Diet Order             Diet regular Room service appropriate? Yes; Fluid consistency: Thin  Diet effective now                  Intake/Output Summary (Last 24 hours) at 01/31/2023 1116 Last data filed at 01/31/2023 0600 Gross per 24 hour  Intake  4990.46 ml  Output 3650 ml  Net 1340.46 ml   Net IO Since Admission: 3,640.65 mL [01/31/23 1116]  Wt Readings from Last 3 Encounters:  01/27/23 75.1 kg  07/24/22 72.6 kg  07/13/21 59 kg     Unresulted Labs (From admission, onward)     Start     Ordered   02/01/23 0500  Heparin level (unfractionated)  Daily,   R     Question:  Specimen collection method  Answer:  Unit=Unit collect   01/31/23 0017   02/01/23 0500  Protime-INR  Daily,   R     Question:  Specimen collection method  Answer:  Unit=Unit collect   01/31/23 0017   02/01/23 0500  Basic metabolic panel  Daily,   R     Question:  Specimen collection method  Answer:  Unit=Unit collect   01/31/23 0745   01/31/23 1400  CBC  Once-Timed,   TIMED       Question:  Specimen collection method  Answer:  Unit=Unit collect   01/31/23 0924   01/31/23 1400  Heparin level (unfractionated)  Once-Timed,   TIMED       Question:  Specimen collection method  Answer:  Unit=Unit collect   01/31/23 1100   01/29/23 0500  CBC  Daily,   R      01/28/23 2155   01/27/23 1857  Creatinine, urine, random  Once,   R        01/27/23 1856   01/27/23 1857  Osmolality, urine  Once,   R        01/27/23 1856   01/27/23 1857  Urinalysis, Complete w Microscopic -Urine, Clean Catch  Once,   R       Question Answer  Comment  Release to patient Immediate   Specimen Source Urine, Clean Catch      01/27/23 1856          Data Reviewed: I have personally reviewed following labs and imaging studies CBC: Recent Labs  Lab 01/27/23 1447 01/28/23 0002 01/29/23 1642 01/29/23 2004 01/30/23 0035 01/30/23 0528 01/30/23 1842 01/30/23 1946 01/30/23 2042 01/31/23 0749  WBC 10.2   < > 8.4 9.2 6.9 5.8  --   --   --  13.6*  NEUTROABS 6.6  --   --   --   --   --   --   --   --   --   HGB 14.0   < > 13.1 12.7 12.0 12.0 10.5* 8.8* 9.5* 8.0*  HCT 39.4   < > 39.6 36.2 34.0* 34.7* 31.0* 26.0* 28.0* 22.5*  MCV 101.0*   < > 104.2* 100.3* 101.5* 102.1*  --   --   --  96.2  PLT PLATELET CLUMPS NOTED ON SMEAR, UNABLE TO ESTIMATE   < > 103* 88* 86* 91*  --   --   --  92*   < > = values in this interval not displayed.   Basic Metabolic Panel: Recent Labs  Lab 01/27/23 1447 01/28/23 0002 01/29/23 0101 01/30/23 0528 01/30/23 1842 01/30/23 1946 01/30/23 2042 01/31/23 0810  NA 127* 128* 132* 135 140 141 141 133*  K 4.9 3.5 3.5 3.0* 4.4 3.9 4.0 3.7  CL 91* 95* 100 102  --  107  --  104  CO2 24 22 24  21*  --   --   --  19*  GLUCOSE 374* 378* 241* 178*  --  189*  --  181*  BUN 12 12 12  5*  --  <3*  --  6  CREATININE 0.80 0.80 0.64 0.56  --  0.30*  --  0.62  CALCIUM 9.5 8.3* 8.3* 7.8*  --   --   --  7.0*  MG  --  1.5* 1.7 1.6*  --   --   --   --   PHOS  --  3.7  --   --   --   --   --   --    GFR: Estimated Creatinine Clearance: 83.5 mL/min (by C-G formula based on SCr of 0.62 mg/dL). Liver Function Tests: Recent Labs  Lab 01/28/23 0002  AST 20  ALT 18  ALKPHOS 94  BILITOT 1.2  PROT 6.5  ALBUMIN 3.1*   Recent Labs  Lab 01/27/23 1630 01/31/23 0620  INR 1.4* 1.7*   No results for input(s): "HGBA1C" in the last 72 hours.  CBG: Recent Labs  Lab 01/30/23 1851 01/30/23 2239 01/30/23 2329 01/31/23 0447 01/31/23 0753  GLUCAP 160* 197* 185* 186* 186*   No results for input(s): "TSH",  "T4TOTAL", "FREET4", "T3FREE", "THYROIDAB" in the last 72 hours.  Sepsis Labs: No results for input(s): "PROCALCITON", "LATICACIDVEN" in the last 168 hours.  Recent Results (from the past 240 hour(s))  Surgical pcr screen     Status: Abnormal   Collection Time: 01/29/23 10:08 AM   Specimen: Nasal Mucosa; Nasal Swab  Result Value Ref Range Status   MRSA, PCR NEGATIVE NEGATIVE Final   Staphylococcus aureus POSITIVE (A) NEGATIVE Final    Comment: (NOTE) The Xpert SA Assay (FDA approved for NASAL specimens in patients 46 years of age and older), is one component of a comprehensive surveillance program. It is not intended to diagnose infection nor to guide or monitor treatment. Performed at [facility], [place] [address] [place], [place] [address]     Antimicrobials: Anti-infectives (From admission, onward)    Start     Dose/Rate Route Frequency Ordered Stop   01/31/23 0400  ceFAZolin (ANCEF) IVPB 1 g/50 mL premix        1 g 100 mL/hr over 30 Minutes Intravenous Every 8 hours 01/30/23 2200 01/31/23 1959   01/29/23 2000  ceFAZolin (ANCEF) injection 1 g  Status:  Discontinued        1 g Intramuscular Every 8 hours 01/29/23 1205 01/29/23 1508   01/29/23 2000  ceFAZolin (ANCEF) IVPB 1 g/50 mL premix        1 g 100 mL/hr over 30 Minutes Intravenous Every 8 hours 01/29/23 1508 01/30/23 0452   01/29/23 1215  ceFAZolin (ANCEF) IVPB 2g/100 mL premix        2 g 200 mL/hr over 30 Minutes Intravenous  Once 01/29/23 1209 01/29/23 1225   01/29/23 1209  ceFAZolin (ANCEF) 2-4 GM/100ML-% IVPB       Note to Pharmacy: Alphonsus Sias: cabinet override      01/29/23 1209 01/29/23 1251      Culture/Microbiology    Component Value Date/Time   SDES URINE, CLEAN CATCH 12/05/2020 1040   [place]  12/05/2020 1040    NONE Performed at [facility], [place] [address], [place], [place] [address]    CULT MULTIPLE SPECIES PRESENT, SUGGEST RECOLLECTION (A) 12/05/2020 1040   REPTSTATUS  12/06/2020 FINAL 12/05/2020 1040  Other culture-see note  Radiology Studies: DG Abd Portable 1V  Result Date: 01/30/2023 CLINICAL DATA:  [id number] Foreign body (FB) in soft tissue [id number] EXAM: PORTABLE ABDOMEN - 1 VIEW COMPARISON:  None Available.  FINDINGS: Stent graft is seen in the aorta and extending into the left iliac artery. No unexpected radiopaque foreign body. Nonobstructive bowel gas pattern. IMPRESSION: Status post stent graft.  No unexpected radiopaque foreign body. These results were called by telephone at the time of interpretation on 01/30/2023 at 10:11 pm to the [place], who verbally acknowledged these results. Electronically Signed   By: Rolm Baptise M.D.   On: 01/30/2023 22:11   DG Chest Port 1 View  Result Date: 01/30/2023 CLINICAL DATA:  [id number] Foreign body (FB) in soft tissue [id number] EXAM: PORTABLE CHEST 1 VIEW COMPARISON:  07/13/2021 FINDINGS: Endotracheal tube is 2 cm above the carina. Heart is normal size. Mediastinal contours within normal limits. No confluent airspace opacities or effusions. Curved metallic density projects over the left chest along the left heart border. Exact anterior to posterior location cannot be determined on this single AP view. IMPRESSION: Curved metallic density projects along the left heart border concerning for foreign body. These results were called by telephone at the time of interpretation on 01/30/2023 at 10:10 pm to the [place], who verbally acknowledged these results. Electronically Signed   By: Rolm Baptise M.D.   On: 01/30/2023 22:10   HYBRID OR IMAGING ([facility] ONLY)  Result Date: 01/30/2023 There is no interpretation for this exam.  This order is for images obtained during a surgical procedure.  Please See "Surgeries" Tab for more information regarding the procedure.   HYBRID OR IMAGING ([facility] ONLY)  Result Date: 01/29/2023 There is no interpretation for this exam.  This order is for images obtained during a surgical procedure.  Please See "Surgeries"  Tab for more information regarding the procedure.     LOS: 3 days   Antonieta Pert, MD [facility]  01/31/2023, 11:16 AM

## 2023-01-31 NOTE — Progress Notes (Signed)
ANTICOAGULATION CONSULT NOTE - Follow-up Note  Pharmacy Consult for Heparin/coumadin Indication: DVT/protein S and C deficiency  No Known Allergies  Patient Measurements: Height: 5\' 4"  (162.6 cm) Weight: 75.1 kg (165 lb 9.1 oz) IBW/kg (Calculated) : 54.7 Heparin Dosing Weight: 70kg  Vital Signs: Temp: 97.5 F (36.4 C) (02/06 2330) Temp Source: Axillary (02/06 2330) BP: 82/57 (02/07 0000) Pulse Rate: 83 (02/07 0015)  Labs: Recent Labs    01/29/23 0101 01/29/23 1642 01/29/23 2004 01/30/23 0035 01/30/23 0319 01/30/23 0528 01/30/23 1842 01/30/23 1946 01/30/23 2042  HGB 12.5   < > 12.7 12.0  --  12.0 10.5* 8.8* 9.5*  HCT 35.8*   < > 36.2 34.0*  --  34.7* 31.0* 26.0* 28.0*  PLT 107*   < > 88* 86*  --  91*  --   --   --   HEPARINUNFRC 0.44  --  <0.10*  --  <0.10*  --   --   --   --   CREATININE 0.64  --   --   --   --  0.56  --  0.30*  --    < > = values in this interval not displayed.     Estimated Creatinine Clearance: 83.5 mL/min (A) (by C-G formula based on SCr of 0.3 mg/dL (L)).   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 Patient reports being s/p IVC filter. Patient is presenting with leg pain and swelling. Patient is on warfarin prior to arrival but has stopped x 5 days for gum surgery. Per patient takes 6mg  daily prior to that. Heparin per pharmacy consult placed for DVT.   Pt is s/o tPA and thrombectomy. D/w Dr. Trula Slade and we can re-start both heparin and coumadin now post procedure. She got quite a bit of heparin during the procedure so we will avoid the load. We will give one time load of coumadin today.   INR 1.4 on 2/3  PTA coumadin 6mg  PO qday  Goal of Therapy:  Heparin level 0.3-0.7 units/ml INR 2-3 Monitor platelets by anticoagulation protocol: Yes   Plan:  Restart heparin 1500 units/hr Coumadin 10mg  PO x1 Check 6 hr HL then daily Daily INR  Onnie Boer, PharmD, BCIDP, AAHIVP,  CPP Infectious Disease Pharmacist 01/31/2023 12:22 AM

## 2023-01-31 NOTE — TOC Benefit Eligibility Note (Addendum)
Patient Teacher, English as a foreign language completed.    The patient is currently admitted and upon discharge could be taking enoxaparin (Lovenox) 80 mg/0.8 ml.  The current 30 day co-pay is $15.00.   The patient is currently admitted and upon discharge could be taking Xarelto 20 mg  The current 30 day co-pay is $50.00.   The patient is insured through [employer] [place], [place] Patient [place] Patient Advocate Team Direct Number: [phone]  Fax: [phone]

## 2023-01-31 NOTE — Progress Notes (Signed)
  Daily Progress Note  Dx: Iliocaval DVT extending into the right leg, currently undergoing thrombolysis.  Subjective: Leg softer  Objective: Vitals:   01/31/23 1200 01/31/23 1300  BP: 113/66 (!) 89/49  Pulse: 67 71  Resp: 16 (!) 23  Temp: 98.1 F (36.7 C)   SpO2: 98% 98%    Physical Examination Palpable pulses in the feet bilaterally Less edema in the right lower extremity Sensorimotor intact Nonlabored breathing Regular rate, normal rhythm   ASSESSMENT/PLAN:  Patient is a 52 year old female with known protein C, protein S deficiency, recently taken off of her warfarin for a dental procedure.  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  The thrombus propagates down both iliac veins, and continues into the right lower extremity.  Now s/p percutaneous pharmacomechanical thrombectomy with filter removal. IVC stent placement.  There was a tine that migrated to the lung, No plan for retrieval.  Overall leg is much better.  Needs hematology - will need bridge with lovenox. I am unsure as to the best anticoagulant. She is concerned that she will have another event as her INR ranges between 2.0-3.5.   Continue to trend labs, can give blood as needed. OOB with PT/OT.   Possible home tomorrow pending improvement.     Cassandria Santee MD [facility] [phone] 01/31/2023  3:02 PM

## 2023-02-01 ENCOUNTER — Encounter (HOSPITAL_COMMUNITY): Payer: Self-pay | Admitting: Vascular Surgery

## 2023-02-01 DIAGNOSIS — D6859 Other primary thrombophilia: Secondary | ICD-10-CM

## 2023-02-01 DIAGNOSIS — K7030 Alcoholic cirrhosis of liver without ascites: Secondary | ICD-10-CM

## 2023-02-01 DIAGNOSIS — E785 Hyperlipidemia, unspecified: Secondary | ICD-10-CM

## 2023-02-01 DIAGNOSIS — D696 Thrombocytopenia, unspecified: Secondary | ICD-10-CM | POA: Diagnosis not present

## 2023-02-01 DIAGNOSIS — Z86718 Personal history of other venous thrombosis and embolism: Secondary | ICD-10-CM | POA: Diagnosis not present

## 2023-02-01 DIAGNOSIS — I82491 Acute embolism and thrombosis of other specified deep vein of right lower extremity: Secondary | ICD-10-CM | POA: Diagnosis not present

## 2023-02-01 DIAGNOSIS — D649 Anemia, unspecified: Secondary | ICD-10-CM

## 2023-02-01 DIAGNOSIS — I82220 Acute embolism and thrombosis of inferior vena cava: Secondary | ICD-10-CM

## 2023-02-01 DIAGNOSIS — E876 Hypokalemia: Secondary | ICD-10-CM

## 2023-02-01 DIAGNOSIS — Z7901 Long term (current) use of anticoagulants: Secondary | ICD-10-CM

## 2023-02-01 DIAGNOSIS — R161 Splenomegaly, not elsewhere classified: Secondary | ICD-10-CM

## 2023-02-01 LAB — CBC
HCT: 20.4 % — ABNORMAL LOW (ref 36.0–46.0)
Hemoglobin: 7 g/dL — ABNORMAL LOW (ref 12.0–15.0)
MCH: 32.9 pg (ref 26.0–34.0)
MCHC: 34.3 g/dL (ref 30.0–36.0)
MCV: 95.8 fL (ref 80.0–100.0)
Platelets: 85 10*3/uL — ABNORMAL LOW (ref 150–400)
RBC: 2.13 MIL/uL — ABNORMAL LOW (ref 3.87–5.11)
RDW: 15.9 % — ABNORMAL HIGH (ref 11.5–15.5)
WBC: 6.7 10*3/uL (ref 4.0–10.5)
nRBC: 0.3 % — ABNORMAL HIGH (ref 0.0–0.2)

## 2023-02-01 LAB — BASIC METABOLIC PANEL
Anion gap: 9 (ref 5–15)
BUN: 5 mg/dL — ABNORMAL LOW (ref 6–20)
CO2: 23 mmol/L (ref 22–32)
Calcium: 7.4 mg/dL — ABNORMAL LOW (ref 8.9–10.3)
Chloride: 107 mmol/L (ref 98–111)
Creatinine, Ser: 0.52 mg/dL (ref 0.44–1.00)
GFR, Estimated: >60 mL/min (ref >60)
Glucose, Bld: 105 mg/dL — ABNORMAL HIGH (ref 70–99)
Potassium: 3 mmol/L — ABNORMAL LOW (ref 3.5–5.1)
Sodium: 139 mmol/L (ref 135–145)

## 2023-02-01 LAB — GLUCOSE, CAPILLARY
Glucose-Capillary: 109 mg/dL — ABNORMAL HIGH (ref 70–99)
Glucose-Capillary: 151 mg/dL — ABNORMAL HIGH (ref 70–99)
Glucose-Capillary: 138 mg/dL — ABNORMAL HIGH (ref 70–99)
Glucose-Capillary: 155 mg/dL — ABNORMAL HIGH (ref 70–99)
Glucose-Capillary: 182 mg/dL — ABNORMAL HIGH (ref 70–99)
Glucose-Capillary: 166 mg/dL — ABNORMAL HIGH (ref 70–99)

## 2023-02-01 LAB — HEPARIN LEVEL (UNFRACTIONATED): Heparin Unfractionated: 0.43 IU/mL (ref 0.30–0.70)

## 2023-02-01 LAB — PREPARE RBC (CROSSMATCH)

## 2023-02-01 LAB — PROTIME-INR
INR: 2.7 — ABNORMAL HIGH (ref 0.8–1.2)
Prothrombin Time: 28.2 seconds — ABNORMAL HIGH (ref 11.4–15.2)

## 2023-02-01 LAB — HEMOGLOBIN AND HEMATOCRIT, BLOOD
HCT: 27.3 % — ABNORMAL LOW (ref 36.0–46.0)
Hemoglobin: 9.3 g/dL — ABNORMAL LOW (ref 12.0–15.0)

## 2023-02-01 LAB — POTASSIUM: Potassium: 3.7 mmol/L (ref 3.5–5.1)

## 2023-02-01 MED ORDER — POTASSIUM CHLORIDE CRYS ER 10 MEQ PO TBCR
60.0000 meq | EXTENDED_RELEASE_TABLET | Freq: Every day | ORAL | Status: DC
  Administered 2023-02-01 – 2023-02-03 (×3): 60 meq via ORAL
  Filled 2023-02-01 (×5): qty 6

## 2023-02-01 MED ORDER — ORAL CARE MOUTH RINSE: 15.0000 mL | OROMUCOSAL | Status: DC | PRN

## 2023-02-01 MED ORDER — OXYCODONE HCL 5 MG PO TABS
5.0000 mg | ORAL_TABLET | ORAL | Status: DC | PRN
  Administered 2023-02-01 – 2023-02-03 (×14): 10 mg via ORAL
  Administered 2023-02-04: 5 mg via ORAL
  Administered 2023-02-04 – 2023-02-08 (×19): 10 mg via ORAL
  Filled 2023-02-01 (×14): qty 2
  Filled 2023-02-01: qty 1
  Filled 2023-02-01 (×19): qty 2

## 2023-02-01 MED ORDER — MIDAZOLAM HCL 2 MG/2ML IJ SOLN
INTRAMUSCULAR | Status: AC
  Administered 2023-02-01: 2 mg
  Filled 2023-02-01: qty 2

## 2023-02-01 MED ORDER — LORAZEPAM 0.5 MG PO TABS
0.5000 mg | ORAL_TABLET | Freq: Four times a day (QID) | ORAL | Status: DC | PRN
  Administered 2023-02-01 – 2023-02-08 (×11): 0.5 mg via ORAL
  Filled 2023-02-01 (×11): qty 1

## 2023-02-01 NOTE — Anesthesia Postprocedure Evaluation (Signed)
Anesthesia Post Note  Patient: Shelby Conner  Procedure(s) Performed: Venogram (Abdomen) VENA CAVA FILTER REMOVAL (Chest) PERCUTANEOUS THROMBECTOMY WITH PENUMBRA (Abdomen) PERCUTANEOUS VENOUS THROMBECTOMY AND LYSIS WITH INTRAVASCULAR ULTRASOUND (IVUS) (Abdomen) INSERTION OF InternalVena Cava Stent (Chest)     Patient location during evaluation: PACU Anesthesia Type: General Level of consciousness: awake and alert Pain management: pain level controlled Vital Signs Assessment: post-procedure vital signs reviewed and stable Respiratory status: spontaneous breathing, nonlabored ventilation, respiratory function stable and patient connected to nasal cannula oxygen Cardiovascular status: blood pressure returned to baseline and stable Postop Assessment: no apparent nausea or vomiting Anesthetic complications: no   No notable events documented.  Last Vitals:  Vitals:   02/01/23 0711 02/01/23 0812  BP: 106/64   Pulse: 67   Resp: (!) 9   Temp: 36.5 C (!) 36.4 C  SpO2: 97%     Last Pain:  Vitals:   02/01/23 0812  TempSrc: Oral  PainSc:                  Audry Pili

## 2023-02-01 NOTE — Progress Notes (Signed)
PROGRESS NOTE Shelby Conner  YIR:[id number] DOB: 1971-05-03 DOA: 01/27/2023 PCP: Joaquin Music, NP   Brief Narrative/Hospital Course: 52 y.o. female with medical history significant for b/l PE while 8 month pregnanat in jan 2006, proximal DVT in the setting of pregnancy/possibly LMWH or UFH Failure in 2010, hx of protein s and C deficiency maintained on woumadin, liver cirrhoses following at [facility] transplant center, and DM2 who presented with right lower extremity edema that began 3 days PTA.  Her chronic Coumadin was held for dental procedure 2 and half week go and inr was as low as 1.4, she hd done in the past.   In the ED underwent further workup: CT abdomen pelvis revealed: hepatic cirrhosis with portal venous hypertension including multiple upper abdominal and esophageal varices as well as mild splenic enlargement.  Inferior vena caval filter is in place with probable venous thrombosis below this level and extending into the pelvic veins. Infiltration in the right iliopsoas region and soft tissues around the right hip likely indicate edema. Vascular surgery was consulted  She was admitted to ICU on heparin drip S/P pharmacomechanical thrombectomy with filter removal and ivc stent placement, coumadin resumed. She needed multiple prbc transfusions for ABLA.  As per care everywhere: she has history bilateral PE in January 2006 while 8 months pregnant. She states she was started on warfarin which she has continued since then. She states this was in [place] and at that time, her hematology team had debated on whether to continue indefinite anticoagulation in light of pregnancy-associated VTE. However, when her thrombophilia testing revealed possible protein C and S deficiencies, they opted to continue anticoagulation. In 2010 she did transition to LMWH for her second pregnancy. She states she did get a DVT during that pregnancy as there was an issue when switching to UFH. Records on Care  Everywhere are limited, but there is an IR Venogram on 10/19/2009 which reveals left common femoral vein thrombus and left iliac thrombus.  She has been maintained on Coumadin since. She was briefly on rivaroxaban ~ 6 months (but was discontinued in 2021 at [facility] while she had AKI and found to have liver cirrhosis and switch back to Coumadin) Family history of DVT in her father, though he has not had any thrombophilia workup. Pt's sister was also apparently diagnosed with protein C and protein S deficiency but has never had a VTE "   Subjective: Patient seen and examined she ambulated yesterday, resting comfortably on the bedside chair  Lower extremities with Ace wrap/drain in legs Just finished her PRBC this morning and about to get second unit Denies new complaints.   Assessment and Plan: Principal Problem:   DVT (deep venous thrombosis) (HCC) Active Problems:   Protein S deficiency (HCC)   Protein C deficiency (HCC)   Type 2 diabetes mellitus without complication (HCC)   History of cirrhosis   Hyponatremia  Acute DVT of inferior vena cava Protein C,protein S deficiency on Coumadin IVC filter in place-s/p removal: S/P pharmacomechanical thrombectomy with filter removal and ivc stent placement. Currently maintained on Coumadin and heparin. On asa 81.  Patient is anxious and worried about being maintained on Coumadin - anxious that she does not have filter anymore and has a stent instead, she feels she will not be able to maintain coumadin at therapeutic range consistently and-She feels she will need to check Coumadin daily at home since she no longer has filter-while her insurance only approves weekly strip for home monitor.  States Lovenox  will not be covered by insurance. She is wondering if she could switch to different anticoagulant, previously on Xarelto but was switched to Coumadin given her liver cirrhosis and AKI.  She reports she did follow-up with a liver transplant at [facility]  and was declined transplant and states she is doing well, missed her last appointment.  I have consulted Dr. Irene Limbo from hematology to address her anticoagulation.   Recent Labs  Lab 01/27/23 1630 01/31/23 0620 02/01/23 0450  INR 1.4* 1.7* 2.7*    Thrombocytopenia:2/19f liver cirrhosis, acute DVT, also on heparin.Monitor closely while on heparin. Recent Labs  Lab 01/30/23 0528 01/31/23 0749 01/31/23 1400 01/31/23 1930 02/01/23 0450  PLT 91* 92* 91* 81* 85*   Anemia of acute blood loss: On admission 12 now w/ ABLA in the setting of thrombolytics/procedure blood loss.getting 1/3 unit PRBC this morning.  Monitor hemoglobin to ensure stabilization before discharge  Recent Labs    01/30/23 2042 01/31/23 0749 01/31/23 1400 01/31/23 1930 02/01/23 0450  HGB 9.5* 8.0* 7.4* 8.6* 7.0*  MCV  --  96.2 98.2 93.7 95.8   Hypomagnesemia:resolved Hypokalemia:Low again and being replaced.  Recent Labs  Lab 01/28/23 0002 01/29/23 0101 01/30/23 0528 01/30/23 1842 01/30/23 1946 01/30/23 2042 01/31/23 0810 02/01/23 0450  K 3.5 3.5 3.0* 4.4 3.9 4.0 3.7 3.0*  CALCIUM 8.3* 8.3* 7.8*  --   --   --  7.0* 7.4*  MG 1.5* 1.7 1.6*  --   --   --   --   --   PHOS 3.7  --   --   --   --   --   --   --    T2DM with hyperglycemia: A1c 11.2, poorly controlled.she used to be on metformin 500 mg and injectable medication that she does not know the name.DM coordinator following.  Started long-acting insulin 5 u bedtime> and on SSI.  Blood sugar well-controlled.  Recent Labs  Lab 01/28/23 0002 01/28/23 0020 01/31/23 1606 01/31/23 2000 01/31/23 2322 02/01/23 0445 02/01/23 0816  GLUCAP  --    < > 208* 183* 162* 109* 151*  HGBA1C 11.2*  --   --   --   --   --   --    < > = values in this interval not displayed.     History of cirrhosis with portal venous hypertension with abdominal and esophageal varices/splenic enlargement: Currently stable.Last viral hepatitis panel 21 was negative. She reports she  did follow-up with a liver transplant at [facility] and was declined transplant and states she is doing well, missed her last appointment ( q65monthly). Current MELD score as belowMELD 3.0: 16 at 01/29/2023  1:01 AM MELD-Na: 11 at 01/29/2023  1:01 AM Calculated from: Serum Creatinine: 0.64 mg/dL (Using min of 1 mg/dL) at 01/29/2023  1:01 AM Serum Sodium: 132 mmol/L at 01/29/2023  1:01 AM Total Bilirubin: 1.2 mg/dL at 01/28/2023 12:02 AM Serum Albumin: 3.1 g/dL at 01/28/2023 12:02 AM INR(ratio): 1.4 at 01/27/2023  4:30 PM Age at listing (hypothetical): 8 years Sex: Female at 01/29/2023  1:01 AM   Hyponatremia improved, TSH slightly up recheck outpatient  DVT prophylaxis: Heparin infusion Code Status:   Code Status: Full Code Family Communication: plan of care discussed with patient at bedside. Patient status is: Inpatient because of acute DVT needing thrombolytics Level of care: ICU   Dispo: The patient is from: home            Anticipated disposition: home 1-2 days once cleared by vascular,  hemoglobin improved and anticoagulation addressed  Objective: Vitals last 24 hrs: Vitals:   02/01/23 0900 02/01/23 0901 02/01/23 0930 02/01/23 1000  BP: 134/68 134/68 136/70 126/68  Pulse: 71 68 73 73  Resp: 12 14 14 16   Temp: 98.5 F (36.9 C) 98.5 F (36.9 C) 98.5 F (36.9 C)   TempSrc: Oral Oral Oral   SpO2: 96% 97% 96% 91%  Weight:      Height:       Weight change:   Physical Examination: General exam: AAOX3, weak,older appearing HEENT:Oral mucosa moist, Ear/Nose WNL grossly, dentition normal. Respiratory system: bilaterally CLEAR BS, no use of accessory muscle Cardiovascular system: S1 & S2 +, regular rate. Gastrointestinal system: Abdomen soft, NT,ND,BS+ Nervous System:Alert, awake, moving extremities and grossly nonfocal Extremities: Bilateral lower extremities in Ace wrap, reports chronic tingling and extremities from neuropathy nonfocal Skin: No rashes,no icterus. MSK: Normal muscle bulk,tone,  power   Medications reviewed:  Scheduled Meds:  sodium chloride   Intravenous Once   sodium chloride   Intravenous Once   aspirin EC  81 mg Oral Daily   chlorhexidine  15 mL Mouth/Throat Once   Or   mouth rinse  15 mL Mouth Rinse Once   Chlorhexidine Gluconate Cloth  6 each Topical Q0600   Chlorhexidine Gluconate Cloth  6 each Topical Daily   insulin aspart  0-20 Units Subcutaneous Q4H   insulin glargine-yfgn  5 Units Subcutaneous QHS   living well with diabetes book   Does not apply Once   mupirocin ointment  1 Application Nasal BID   potassium chloride  60 mEq Oral Daily   Warfarin - Pharmacist Dosing Inpatient   Does not apply q1600   Continuous Infusions:  sodium chloride 100 mL/hr at 02/01/23 1000   heparin 1,500 Units/hr (02/01/23 1000)   lactated ringers      Diet Order             Diet Carb Modified Fluid consistency: Thin; Room service appropriate? Yes  Diet effective now                  Intake/Output Summary (Last 24 hours) at 02/01/2023 1018 Last data filed at 02/01/2023 1000 Gross per 24 hour  Intake 3806.89 ml  Output 3750 ml  Net 56.89 ml    Net IO Since Admission: 3,697.54 mL [02/01/23 1018]  Wt Readings from Last 3 Encounters:  01/27/23 75.1 kg  07/24/22 72.6 kg  07/13/21 59 kg     Unresulted Labs (From admission, onward)     Start     Ordered   02/01/23 0500  Heparin level (unfractionated)  Daily,   R     Question:  Specimen collection method  Answer:  Unit=Unit collect   01/31/23 0017   02/01/23 0500  Protime-INR  Daily,   R     Question:  Specimen collection method  Answer:  Unit=Unit collect   01/31/23 0017   02/01/23 3875  Basic metabolic panel  Daily,   R     Question:  Specimen collection method  Answer:  Unit=Unit collect   01/31/23 0745   01/29/23 0500  CBC  Daily,   R      01/28/23 2155   01/27/23 1857  Creatinine, urine, random  Once,   R        01/27/23 1856   01/27/23 1857  Osmolality, urine  Once,   R        01/27/23 1856    01/27/23 1857  Urinalysis, Complete w Microscopic -Urine, Clean Catch  Once,   R       Question Answer Comment  Release to patient Immediate   Specimen Source Urine, Clean Catch      01/27/23 1856          Data Reviewed: I have personally reviewed following labs and imaging studies CBC: Recent Labs  Lab 01/27/23 1447 01/28/23 0002 01/30/23 0528 01/30/23 1842 01/30/23 2042 01/31/23 0749 01/31/23 1400 01/31/23 1930 02/01/23 0450  WBC 10.2   < > 5.8  --   --  13.6* 13.0* 9.7 6.7  NEUTROABS 6.6  --   --   --   --   --   --   --   --   HGB 14.0   < > 12.0   < > 9.5* 8.0* 7.4* 8.6* 7.0*  HCT 39.4   < > 34.7*   < > 28.0* 22.5* 21.7* 23.6* 20.4*  MCV 101.0*   < > 102.1*  --   --  96.2 98.2 93.7 95.8  PLT PLATELET CLUMPS NOTED ON SMEAR, UNABLE TO ESTIMATE   < > 91*  --   --  92* 91* 81* 85*   < > = values in this interval not displayed.    Basic Metabolic Panel: Recent Labs  Lab 01/28/23 0002 01/29/23 0101 01/30/23 0528 01/30/23 1842 01/30/23 1946 01/30/23 2042 01/31/23 0810 02/01/23 0450  NA 128* 132* 135 140 141 141 133* 139  K 3.5 3.5 3.0* 4.4 3.9 4.0 3.7 3.0*  CL 95* 100 102  --  107  --  104 107  CO2 22 24 21*  --   --   --  19* 23  GLUCOSE 378* 241* 178*  --  189*  --  181* 105*  BUN 12 12 5*  --  <3*  --  6 5*  CREATININE 0.80 0.64 0.56  --  0.30*  --  0.62 0.52  CALCIUM 8.3* 8.3* 7.8*  --   --   --  7.0* 7.4*  MG 1.5* 1.7 1.6*  --   --   --   --   --   PHOS 3.7  --   --   --   --   --   --   --     GFR: Estimated Creatinine Clearance: 83.5 mL/min (by C-G formula based on SCr of 0.52 mg/dL). Liver Function Tests: Recent Labs  Lab 01/28/23 0002  AST 20  ALT 18  ALKPHOS 94  BILITOT 1.2  PROT 6.5  ALBUMIN 3.1*   CBG: Recent Labs  Lab 01/31/23 1606 01/31/23 2000 01/31/23 2322 02/01/23 0445 02/01/23 0816  GLUCAP 208* 183* 162* 109* 151*   No results for input(s): "TSH", "T4TOTAL", "FREET4", "T3FREE", "THYROIDAB" in the last 72 hours. Sepsis  Labs: No results for input(s): "PROCALCITON", "LATICACIDVEN" in the last 168 hours.  Recent Results (from the past 240 hour(s))  Surgical pcr screen     Status: Abnormal   Collection Time: 01/29/23 10:08 AM   Specimen: Nasal Mucosa; Nasal Swab  Result Value Ref Range Status   MRSA, PCR NEGATIVE NEGATIVE Final   Staphylococcus aureus POSITIVE (A) NEGATIVE Final    Comment: (NOTE) The Xpert SA Assay (FDA approved for NASAL specimens in patients 21 years of age and older), is one component of a comprehensive surveillance program. It is not intended to diagnose infection nor to guide or monitor treatment. Performed at [facility], [address] [place],  [place] [address]     Antimicrobials: Anti-infectives (From admission, onward)    Start     Dose/Rate Route Frequency Ordered Stop   01/31/23 0400  ceFAZolin (ANCEF) IVPB 1 g/50 mL premix        1 g 100 mL/hr over 30 Minutes Intravenous Every 8 hours 01/30/23 2200 01/31/23 1223   01/29/23 2000  ceFAZolin (ANCEF) injection 1 g  Status:  Discontinued        1 g Intramuscular Every 8 hours 01/29/23 1205 01/29/23 1508   01/29/23 2000  ceFAZolin (ANCEF) IVPB 1 g/50 mL premix        1 g 100 mL/hr over 30 Minutes Intravenous Every 8 hours 01/29/23 1508 01/30/23 0452   01/29/23 1215  ceFAZolin (ANCEF) IVPB 2g/100 mL premix        2 g 200 mL/hr over 30 Minutes Intravenous  Once 01/29/23 1209 01/29/23 1225   01/29/23 1209  ceFAZolin (ANCEF) 2-4 GM/100ML-% IVPB       Note to Pharmacy: Alphonsus Sias: cabinet override      01/29/23 1209 01/29/23 1251     Radiology Studies: DG Abd Portable 1V  Result Date: 01/30/2023 CLINICAL DATA:  [id number] Foreign body (FB) in soft tissue 220254 EXAM: PORTABLE ABDOMEN - 1 VIEW COMPARISON:  None Available. FINDINGS: Stent graft is seen in the aorta and extending into the left iliac artery. No unexpected radiopaque foreign body. Nonobstructive bowel gas pattern. IMPRESSION: Status post stent graft.  No  unexpected radiopaque foreign body. These results were called by telephone at the time of interpretation on 01/30/2023 at 10:11 pm to the [place], who verbally acknowledged these results. Electronically Signed   By: Rolm Baptise M.D.   On: 01/30/2023 22:11   DG Chest Port 1 View  Result Date: 01/30/2023 CLINICAL DATA:  [id number] Foreign body (FB) in soft tissue [id number] EXAM: PORTABLE CHEST 1 VIEW COMPARISON:  07/13/2021 FINDINGS: Endotracheal tube is 2 cm above the carina. Heart is normal size. Mediastinal contours within normal limits. No confluent airspace opacities or effusions. Curved metallic density projects over the left chest along the left heart border. Exact anterior to posterior location cannot be determined on this single AP view. IMPRESSION: Curved metallic density projects along the left heart border concerning for foreign body. These results were called by telephone at the time of interpretation on 01/30/2023 at 10:10 pm to the [place], who verbally acknowledged these results. Electronically Signed   By: Rolm Baptise M.D.   On: 01/30/2023 22:10   HYBRID OR IMAGING ([facility] ONLY)  Result Date: 01/30/2023 There is no interpretation for this exam.  This order is for images obtained during a surgical procedure.  Please See "Surgeries" Tab for more information regarding the procedure.     LOS: 4 days   Antonieta Pert, MD [facility]  02/01/2023, 10:18 AM

## 2023-02-01 NOTE — Progress Notes (Signed)
ANTICOAGULATION CONSULT NOTE - Follow-up Note  Pharmacy Consult for Heparin/coumadin Indication: DVT/protein S and C deficiency  No Known Allergies  Patient Measurements: Height: 5\' 4"  (162.6 cm) Weight: 75.1 kg (165 lb 9.1 oz) IBW/kg (Calculated) : 54.7 Heparin Dosing Weight: 70kg  Vital Signs: Temp: 98.5 F (36.9 C) (02/08 1125) Temp Source: Oral (02/08 1125) BP: 156/97 (02/08 1200) Pulse Rate: 65 (02/08 1200)  Labs: Recent Labs    01/30/23 1946 01/30/23 2042 01/31/23 0620 01/31/23 0749 01/31/23 0810 01/31/23 1400 01/31/23 1930 02/01/23 0450  HGB 8.8*   < >  --    < >  --  7.4* 8.6* 7.0*  HCT 26.0*   < >  --    < >  --  21.7* 23.6* 20.4*  PLT  --   --   --    < >  --  91* 81* 85*  LABPROT  --   --  20.0*  --   --   --   --  28.2*  INR  --   --  1.7*  --   --   --   --  2.7*  HEPARINUNFRC  --   --  0.39  --   --  0.43  --  0.43  CREATININE 0.30*  --   --   --  0.62  --   --  0.52   < > = values in this interval not displayed.     Estimated Creatinine Clearance: 83.5 mL/min (by C-G formula based on SCr of 0.52 mg/dL).   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 Patient reports being s/p IVC filter. Patient is presenting with leg pain and swelling. Patient is on warfarin prior to arrival but has stopped x 5 days for gum surgery. Per patient takes 6mg  daily prior to that. Heparin per pharmacy consult placed for DVT.   Pt is s/o tPA and thrombectomy. Heparin and coumadin restarted 2/6 post procedure.  INR= 1.7> 2.7, heparin level= 0.43 on 1500 units/hr -hg= 7.0 (has been 7-8 since 2/6), plt low/stable   PTA coumadin 6mg  PO qday   Goal of Therapy:  Heparin level 0.3-0.7 units/ml INR 2-3 Monitor platelets by anticoagulation protocol: Yes   Plan:  Continue heparin 1500 units/hr Plans will be to d/c heparin 2/9 if INR remains in goal Hold warfarin today as I anticipate continued INR trend  up Daily INR  Hildred Laser, PharmD Clinical Pharmacist **Pharmacist phone directory can now be found on [email or web address] ([email or web address]).  Listed under [place].

## 2023-02-01 NOTE — Progress Notes (Signed)
  Daily Progress Note  Dx: Iliocaval DVT extending into the right leg, currently undergoing thrombolysis.  Subjective: Leg softer, neck pain, poor appetite.   Objective: Vitals:   02/01/23 0640 02/01/23 0711  BP: 104/65 106/64  Pulse: (!) 57 67  Resp: 17 (!) 9  Temp: 98.2 F (36.8 C) 97.7 F (36.5 C)  SpO2: 94% 97%    Physical Examination Palpable pulses in the feet bilaterally Less edema in the right lower extremity Sensorimotor intact Nonlabored breathing Regular rate, normal rhythm   ASSESSMENT/PLAN:  Patient is a 52 year old female with known protein C, protein S deficiency, recently taken off of her warfarin for a dental procedure.  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  The thrombus propagates down both iliac veins, and continues into the right lower extremity.  Now s/p percutaneous pharmacomechanical thrombectomy with filter removal. IVC stent placement.  There was a tine that migrated to the lung, No plan for retrieval.  Overall leg is much better.  Needs hematology - will need bridge with lovenox. I am unsure as to the best anticoagulant. She is concerned that she will have another event as her INR ranges between 2.0-3.5.  Appreciate if hematology can see.  Continue to trend labs, can give blood as needed. OOB with PT/OT.  Thigh high compression stockings Ativan discontinued     Cassandria Santee MD MS [facility] [phone] 02/01/2023  8:08 AM

## 2023-02-01 NOTE — Consult Note (Addendum)
Marland Kitchen   HEMATOLOGY/ONCOLOGY CONSULTATION NOTE  Date of Service: 02/01/2023  Patient Care Team: Joaquin Music, NP as PCP - General (Nurse Practitioner)  CHIEF COMPLAINTS/PURPOSE OF CONSULTATION:  Recommendations regarding recurrent VTE and anticoagulation.  HISTORY OF PRESENTING ILLNESS:   Shelby Conner is a 52 y.o. 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 Guido Sander at [facility] for evaluation of possible liver transplantation.  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  Previous imaging with a CT scan of the abdomen on 11/14/2020 showed liver cirrhosis portal hypertension splenomegaly and ascites.  EGD on 11/16/2020 showed esophagitis and gastritis with no overt varices.  Patient was seen by hematology at [facility] by London Sheer PA-C on 02/08/2021 for hematology evaluation and a pre-liver transplant setting.   Patient reportedly has had history of bilateral PE in January 2006 whilst 8 months pregnant and also notes having significant left-sided DVT at the time. She reports having had an IVC filter placed as well as a left iliac vein stent placed in 2006.   She notes that this was diagnosed in [place] and at the time her hematology team had noted that she had protein C and protein S deficiencies and therefore though this was potentially triggered by her pregnancy the hematologist opted to continue long-term anticoagulation.  She notes that she reports having had another unprovoked DVT in 2008 but does not remember the details.  She reports having DVT during transition back to heparin shots after her C-section with her second pregnancy in 2010.  Venogram on 10/19/2009 reveals a left common femoral vein thrombus and left iliac thrombus.  She also reports smaller DVT after  her C-section for her third child in 2013.  Patient has been on Coumadin continuously except during pregnancy since 2006. She was recommended to keep the IVC filter in long-term.  Her Coumadin has been managed by her primary care physician near [place].  She has a home INR monitor which she uses once a week and works with her primary care physician to adjust her Coumadin. She does note that her Coumadin levels do tend to fluctuate and she has had issues with subtherapeutic Coumadin levels in the past.  Patient also has reported family history of DVT in her father without any known thrombophilia workup.  Patient's sister was also apparently diagnosed with protein C and protein S deficiency and recently had bilateral PE. She has not reported any significant bleeding complications on her Coumadin.  No previous history of arterial thrombosis.  Patient on 01/27/2023 presented with right leg swelling for 3 days.  No travel no hormonal medications no chest pain no shortness of breath.  Denies smoking or alcohol use.  Patient reports that she was recently taken off Coumadin for her dental procedure about 2 and half to 3 weeks ago.  She notes she did she had held her Coumadin for about 3 days prior to the procedure and her preprocedure INR was 1.4 and then had restarted her Coumadin again after her procedure within 24 hours however her INR on admission on 01/27/2023 was still subtherapeutic nearly 2 weeks later. Also she had restarted her Coumadin without any bridging Lovenox which we discussed could be a risk factor for clots in the context of protein C deficiency and use of Coumadin without bridging  Lovenox.  Right lower extremity venous Doppler ultrasound 01/27/2023 showed acute deep vein thrombosis involving the right  common femoral vein, SF junction, right femoral vein, right proximal profunda vein, right popliteal vein, right posterior tibial veins, right peroneal veins, and EIV, CIV. A cystic structure  is found in the popliteal fossa.   Patient subsequently had a CT of the abdomen and pelvis with contrast on 01/27/2023 which showed1. Hepatic cirrhosis with portal venous hypertension including multiple upper abdominal and esophageal varices as well as mild splenic enlargement. 2. Probable heterogeneous fatty infiltration of the liver. Fatty infiltration has improved since the previous study. 3. Cholelithiasis without CT evidence of acute cholecystitis. 4. Inferior vena caval filter is in place with probable venous thrombosis below this level and extending into the pelvic veins. Infiltration in the right iliopsoas region and soft tissues around the right hip likely indicate edema although infection could also have this appearance.  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  There was a time that apparently migrated to the lung with no plan for retrieval.  Patient presented with normal hemoglobin of 12.2 with mild thrombocytopenia with platelets of 96k Labs today on 02/01/2025 showed a drop in hemoglobin to 7 with platelets of 85k and WBC count of 6.7k.  It is likely that the drop in hemoglobin is due to acute blood loss from her procedure.  Patient has already been placed on aspirin 81 mg p.o. daily is currently on IV heparin and has been started back on Coumadin.  Patient does report that she previously has been on Xarelto for nearly a year in 2021 and tolerated this well without any bleeding issues or breakthrough clots but was switched back to Coumadin for unclear reasons.  She cannot tell us what might be the reason for this. Hematology was consulted for recommendations regarding anticoagulation.  MEDICAL HISTORY:  Past Medical History:  Diagnosis Date   Diabetes mellitus without  complication (HCC)    Fatty liver    Hyperlipidemia    Protein C deficiency (HCC)    Protein S deficiency (HCC)   Liver cirrhosis status post liver [facility]. History of recurrent VTE as detailed above  SURGICAL HISTORY: Past Surgical History:  Procedure Laterality Date   ANKLE SURGERY Right    BIOPSY  11/16/2020   Procedure: BIOPSY;  Surgeon: Kathi DerBrahmbhatt, Parag, MD;  Location: [facility] ENDOSCOPY;  Service: Gastroenterology;;   CESAREAN SECTION     ESOPHAGOGASTRODUODENOSCOPY N/A 11/16/2020   Procedure: ESOPHAGOGASTRODUODENOSCOPY (EGD);  Surgeon: Kathi DerBrahmbhatt, Parag, MD;  [facility];  Service: Gastroenterology;  Laterality: N/A;   IR PARACENTESIS  11/12/2020   KNEE SURGERY Left     SOCIAL HISTORY: Social History   Socioeconomic History   Marital status: Divorced    Spouse name: Not on file   Number of children: Not on file   Years of education: Not on file   Highest education level: Not on file  Occupational History   Not on file  Tobacco Use   Smoking status: Former   Smokeless tobacco: Never  Vaping Use   Vaping Use: Never used  Substance and Sexual Activity   Alcohol use: Yes    Comment: occasionally   Drug use: Never   Sexual activity: Not on file  Other Topics Concern   Not on file  Social  History Narrative   Not on file   Social Determinants of Health   Financial Resource Strain: Not on file  Food Insecurity: Not on file  Transportation Needs: Not on file  Physical Activity: Not on file  Stress: Not on file  Social Connections: Not on file  Intimate Partner Violence: Not on file  Former smoker No reported alcohol use since October 2021.  Previously with history of alcohol abuse with liver cirrhosis.  FAMILY HISTORY: Family History  Problem Relation Age of Onset   Liver disease Neg Hx   Mother with COPD Father diabetes Hypercoagulable state in her father and sister   ALLERGIES:  has No Known Allergies.  MEDICATIONS:   Current Facility-Administered Medications  Medication Dose Route Frequency Provider Last Rate Last Admin   0.9 %  sodium chloride infusion (Manually program via Guardrails IV Fluids)   Intravenous Once Epifanio Lesches, CRNA       0.9 %  sodium chloride infusion (Manually program via Guardrails IV Fluids)   Intravenous Once Victorino Sparrow, MD       0.9 %  sodium chloride infusion   Intravenous Continuous Nada Libman, MD 100 mL/hr at 02/01/23 0230 IV Pump Association at 02/01/23 0230   acetaminophen (TYLENOL) tablet 650 mg  650 mg Oral Q6H PRN Nada Libman, MD   650 mg at 01/28/23 1930   Or   acetaminophen (TYLENOL) suppository 650 mg  650 mg Rectal Q6H PRN Nada Libman, MD       aspirin EC tablet 81 mg  81 mg Oral Daily Nada Libman, MD   81 mg at 02/01/23 7253   chlorhexidine (PERIDEX) 0.12 % solution 15 mL  15 mL Mouth/Throat Once Eilene Ghazi, MD       Or   Oral care mouth rinse  15 mL Mouth Rinse Once Eilene Ghazi, MD       Chlorhexidine Gluconate Cloth 2 % PADS 6 each  6 each Topical Q0600 Maurilio Lovely D, DO   6 each at 02/01/23 0544   Chlorhexidine Gluconate Cloth 2 % PADS 6 each  6 each Topical Daily Maurilio Lovely D, DO   6 each at 02/01/23 6644   diphenhydrAMINE (BENADRYL) injection 12.5 mg  12.5 mg Intravenous Q6H PRN Maurilio Lovely D, DO   12.5 mg at 01/31/23 1957   heparin ADULT infusion 100 units/mL (25000 units/228mL)  1,500 Units/hr Intravenous Continuous Pham, Minh Q, RPH-CPP 15 mL/hr at 02/01/23 0200 1,500 Units/hr at 02/01/23 0200   hydrALAZINE (APRESOLINE) injection 5 mg  5 mg Intravenous Q20 Min PRN Victorino Sparrow, MD       HYDROcodone-acetaminophen (NORCO/VICODIN) 5-325 MG per tablet 1-2 tablet  1-2 tablet Oral Q4H PRN Nada Libman, MD   2 tablet at 02/01/23 0347   insulin aspart (novoLOG) injection 0-20 Units  0-20 Units Subcutaneous Q4H Nada Libman, MD   4 Units at 02/01/23 0820   insulin glargine-yfgn [facility]) injection 5 Units  5 Units  Subcutaneous QHS Nada Libman, MD   5 Units at 01/31/23 2154   labetalol (NORMODYNE) injection 10 mg  10 mg Intravenous Q10 min PRN Nada Libman, MD       lactated ringers infusion   Intravenous Continuous Eilene Ghazi, MD       living well with diabetes book MISC   Does not apply Once Kc, Dayna Barker, MD       morphine (PF) 4 MG/ML injection 5 mg  5 mg Intravenous  Q1H PRN Serafina Mitchell, MD       mupirocin ointment (BACTROBAN) 2 % 1 Application  1 Application Nasal BID Serafina Mitchell, MD   1 Application at 63/87/56 0941   ondansetron (ZOFRAN) injection 4 mg  4 mg Intravenous Q6H PRN Serafina Mitchell, MD       Oral care mouth rinse  15 mL Mouth Rinse PRN Kc, Ramesh, MD       oxyCODONE (Oxy IR/ROXICODONE) immediate release tablet 5-10 mg  5-10 mg Oral Q3H PRN Broadus John, MD   10 mg at 02/01/23 0842   potassium chloride (KLOR-CON) CR tablet 60 mEq  60 mEq Oral Daily Cherre Robins, MD   60 mEq at 02/01/23 4332   Warfarin - Pharmacist Dosing Inpatient   Does not apply q1600 Onnie Boer Q, RPH-CPP        REVIEW OF SYSTEMS:    10 Point review of Systems was done is negative except as noted above.  PHYSICAL EXAMINATION: ECOG PERFORMANCE STATUS: 3 - Symptomatic, >50% confined to bed  . Vitals:   02/01/23 0901 02/01/23 0930  BP: 134/68 136/70  Pulse: 68 73  Resp: 14 14  Temp: 98.5 F (36.9 C) 98.5 F (36.9 C)  SpO2: 97% 96%   Filed Weights   01/27/23 1722  Weight: 165 lb 9.1 oz (75.1 kg)   .Body mass index is 28.42 kg/m.  GENERAL:alert, in no acute distress and comfortable SKIN: no acute rashes, no significant lesions EYES: conjunctiva are pink and non-injected, sclera anicteric OROPHARYNX: MMM, no exudates, no oropharyngeal erythema or ulceration NECK: supple, no JVD LYMPH:  no palpable lymphadenopathy in the cervical, axillary or inguinal regions LUNGS: clear to auscultation b/l with normal respiratory effort HEART: regular rate & rhythm ABDOMEN:  normoactive  bowel sounds , non tender, not distended. Extremity: Bilateral lower extremity swelling.  Bilateral lower extremities have been wrapped in Ace wrap. PSYCH: alert & oriented x 3 with fluent speech NEURO: no focal motor/sensory deficits  LABORATORY DATA:  I have reviewed the data as listed  .    Latest Ref Rng & Units 02/01/2023    4:50 AM 01/31/2023    7:30 PM 01/31/2023    2:00 PM  CBC  WBC 4.0 - 10.5 K/uL 6.7  9.7  13.0   Hemoglobin 12.0 - 15.0 g/dL 7.0  8.6  7.4   Hematocrit 36.0 - 46.0 % 20.4  23.6  21.7   Platelets 150 - 400 K/uL 85  81  91     .    Latest Ref Rng & Units 02/01/2023    4:50 AM 01/31/2023    8:10 AM 01/30/2023    8:42 PM  CMP  Glucose 70 - 99 mg/dL 105  181    BUN 6 - 20 mg/dL 5  6    Creatinine 0.44 - 1.00 mg/dL 0.52  0.62    Sodium 135 - 145 mmol/L 139  133  141   Potassium 3.5 - 5.1 mmol/L 3.0  3.7  4.0   Chloride 98 - 111 mmol/L 107  104    CO2 22 - 32 mmol/L 23  19    Calcium 8.9 - 10.3 mg/dL 7.4  7.0       RADIOGRAPHIC STUDIES: I have personally reviewed the radiological images as listed and agreed with the findings in the report. DG Abd Portable 1V  Result Date: 01/30/2023 CLINICAL DATA:  [id number] Foreign body (FB) in soft tissue [phone] EXAM: PORTABLE ABDOMEN -  1 VIEW COMPARISON:  None Available. FINDINGS: Stent graft is seen in the aorta and extending into the left iliac artery. No unexpected radiopaque foreign body. Nonobstructive bowel gas pattern. IMPRESSION: Status post stent graft.  No unexpected radiopaque foreign body. These results were called by telephone at the time of interpretation on 01/30/2023 at 10:11 pm to the OR nurse, who verbally acknowledged these results. Electronically Signed   By: Charlett NoseKevin  Dover M.D.   On: 01/30/2023 22:11   DG Chest Port 1 View  Result Date: 01/30/2023 CLINICAL DATA:  [id number] Foreign body (FB) in soft tissue [id number] EXAM: PORTABLE CHEST 1 VIEW COMPARISON:  07/13/2021 FINDINGS: Endotracheal tube is 2 cm above the carina.  Heart is normal size. Mediastinal contours within normal limits. No confluent airspace opacities or effusions. Curved metallic density projects over the left chest along the left heart border. Exact anterior to posterior location cannot be determined on this single AP view. IMPRESSION: Curved metallic density projects along the left heart border concerning for foreign body. These results were called by telephone at the time of interpretation on 01/30/2023 at 10:10 pm to the OR nurse, who verbally acknowledged these results. Electronically Signed   By: Charlett NoseKevin  Dover M.D.   On: 01/30/2023 22:10   HYBRID OR IMAGING ([facility] ONLY)  Result Date: 01/30/2023 There is no interpretation for this exam.  This order is for images obtained during a surgical procedure.  Please See "Surgeries" Tab for more information regarding the procedure.   VAS US LOWER EXTREMITY VENOUS (DVT) (7a-7p)  Result Date: 01/29/2023  Lower Venous DVT Study Patient Name:  Garlon HatchetBETHANY Conner  Date of Exam:   01/27/2023 Medical Rec #: [id number]                Accession #:    [id number] Date of Birth: 10/03/1971                 Patient Gender: F Patient Age:   [age] years [facility] Procedure:      VAS US LOWER EXTREMITY VENOUS (DVT) Referring Phys: Claudette StaplerAROLINE ABERMAN --------------------------------------------------------------------------------  Indications: Pain, and Swelling. Patient has IVC filter per CT done 11/14/20  Risk Factors: Personal history of DVT. Patient with Cirrhosis Anticoagulation: Coumadin. Patient taken off Warfarin 5 days for dental procedure. Comparison Study: No prior study on file Performing Technologist: Sherren Kernsandace Kanady RVS  Examination Guidelines: A complete evaluation includes B-mode imaging, spectral Doppler, color Doppler, and power Doppler as needed of all accessible portions of each vessel. Bilateral testing is considered an integral part of a complete examination. Limited examinations for reoccurring  indications may be performed as noted. The reflux portion of the exam is performed with the patient in reverse Trendelenburg.  +---------+---------------+---------+-----------+----------+-------------------+ RIGHT    CompressibilityPhasicitySpontaneityPropertiesThrombus Aging      +---------+---------------+---------+-----------+----------+-------------------+ CFV      None           No       No                   Acute               +---------+---------------+---------+-----------+----------+-------------------+ SFJ      None                                         Acute               +---------+---------------+---------+-----------+----------+-------------------+  FV Prox  None           No       No                   Acute               +---------+---------------+---------+-----------+----------+-------------------+ FV Mid   None           No       No                   Acute               +---------+---------------+---------+-----------+----------+-------------------+ FV DistalNone           No       No                   Acute               +---------+---------------+---------+-----------+----------+-------------------+ PFV      Partial        No       Yes                  Acute               +---------+---------------+---------+-----------+----------+-------------------+ POP      None           No       No                   Acute               +---------+---------------+---------+-----------+----------+-------------------+ PTV      None                                         Acute prox to mid                                                         portion             +---------+---------------+---------+-----------+----------+-------------------+ PERO     None                                         Acute prox to mid                                                         portion              +---------+---------------+---------+-----------+----------+-------------------+ EIV      None           No       No                   Acute               +---------+---------------+---------+-----------+----------+-------------------+ CIV  No       No                   acute               +---------+---------------+---------+-----------+----------+-------------------+   Right Technical Findings: Not visualized segments include Unable to visualize IVC.  +----+---------------+---------+-----------+----------+-----------------------+ LEFTCompressibilityPhasicitySpontaneityPropertiesThrombus Aging          +----+---------------+---------+-----------+----------+-----------------------+ CFV Full           Yes      Yes                                          +----+---------------+---------+-----------+----------+-----------------------+ EIV                                              Acute                   +----+---------------+---------+-----------+----------+-----------------------+ CIV                                              Acute in distal portion +----+---------------+---------+-----------+----------+-----------------------+   Left Technical Findings: Not visualized segments include Unable to visualize proximal to mid CIV, IVC.   Summary: RIGHT: - Findings consistent with acute deep vein thrombosis involving the right common femoral vein, SF junction, right femoral vein, right proximal profunda vein, right popliteal vein, right posterior tibial veins, right peroneal veins, and EIV, CIV. - A cystic structure is found in the popliteal fossa.  LEFT: - Findings consistent with acute deep vein thrombosis involving the EIV, distal CIV.  *See table(s) above for measurements and observations. Electronically signed by Servando Snare MD on 01/29/2023 at 2:41:32 PM.    Final    HYBRID OR IMAGING ([place])  Result Date: 01/29/2023 There is no interpretation for  this exam.  This order is for images obtained during a surgical procedure.  Please See "Surgeries" Tab for more information regarding the procedure.   CT ABDOMEN PELVIS W CONTRAST  Result Date: 01/27/2023 CLINICAL DATA:  Proximal DVT EXAM: CT ABDOMEN AND PELVIS WITH CONTRAST TECHNIQUE: Multidetector CT imaging of the abdomen and pelvis was performed using the standard protocol following bolus administration of intravenous contrast. RADIATION DOSE REDUCTION: This exam was performed according to the departmental dose-optimization program which includes automated exposure control, adjustment of the mA and/or kV according to patient size and/or use of iterative reconstruction technique. CONTRAST:  50mL OMNIPAQUE IOHEXOL 350 MG/ML SOLN COMPARISON:  11/14/2020 FINDINGS: Lower chest: Mild dependent changes in the lung bases. Prominent esophageal varices. Hepatobiliary: Cirrhotic changes in the liver with enlarged lateral segment left lobe and caudate lobe and with nodular contour to the liver. There is somewhat poorly defined low-attenuation change in the anterior liver. The previous study demonstrated severe fatty infiltration throughout the liver which has improved since that time. It is likely that the low-attenuation changes reflect some residual heterogeneous fatty infiltration. No specific focal nodules are demonstrated. Cholelithiasis with small stones in the gallbladder. No gallbladder wall thickening or stranding. No bile duct dilatation. The portal veins are patent with prominent portal venous collaterals including periumbilical vein varices and splenic vein varices. Pancreas:  Unremarkable. No pancreatic ductal dilatation or surrounding inflammatory changes. Spleen: Spleen size is upper limits of normal.  No focal lesions. Adrenals/Urinary Tract: Adrenal glands are unremarkable. Kidneys are normal, without renal calculi, focal lesion, or hydronephrosis. Bladder is unremarkable. Stomach/Bowel: Stomach, small  bowel, and colon are not abnormally distended. No wall thickening or inflammatory changes. Appendix is normal. Vascular/Lymphatic: Diffuse aortic calcification. No aneurysm. An inferior vena caval filter is present. Stent in the left common iliac vein. No flow is demonstrated in the visualized inferior vena cava below the stent or in the iliac and external iliac veins with mild stranding around the external iliac veins. This likely indicates venous thrombosis. 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 Reproductive: Uterus and bilateral adnexa are unremarkable. Other: Infiltration in the subcutaneous fat around the right hip and right iliopsoas region likely representing edema although infection could also have this appearance. No loculated collections. No free air or free fluid in the abdomen. Musculoskeletal: No acute or significant osseous findings. IMPRESSION: 1. Hepatic cirrhosis with portal venous hypertension including multiple upper abdominal and esophageal varices as well as mild splenic enlargement. 2. Probable heterogeneous fatty infiltration of the liver. Fatty infiltration has improved since the previous study. 3. Cholelithiasis without CT evidence of acute cholecystitis. 4. Inferior vena caval filter is in place with probable venous thrombosis below this level and extending into the pelvic veins. Infiltration in the right iliopsoas region and soft tissues around the right hip likely indicate edema although infection could also have this appearance. 5. Aortic atherosclerosis. Electronically Signed   By: Burman Nieves M.D.   On: 01/27/2023 18:43    ASSESSMENT & PLAN:   52 year old with history of liver cirrhosis due to alcohol being evaluated for liver transplant at [facility], previous history of VTE with concern for protein C and protein S deficiency presenting with  #1 IVC thrombosis up to her IVC filter and extending into bilateral iliac veins and  further down the leg. Concern for possible IVC stenosis from IVC filter. Patient was off Coumadin when this event occurred.  Patient is status post pharmacomechanical thrombectomy and IVC filter removal and status post IVC stent placement.  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 Thought to be triggered by pregnancy but also found to have protein C and protein S deficiency. Has been on chronic Coumadin since 2006 per her hematologist in [place]. Was seen by [facility] hematology in 2022.  #3 history of liver cirrhosis.  Unclear baseline INR/prothrombin time. Potential for complex coagulopathy with risk of bleeding and clotting.  #4 family history of VTE in her father and sister.  #5 acute blood loss anemia due to blood loss with her thrombectomy procedure.  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  #7 diabetes type 2  #8 dyslipidemia  #9 history of alcohol abuse currently sober since 2021.  #10 history of IVC filter placement and left common iliac vein for stenting in 2006  #11 hypokalemia and hypocalcemia. 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 -We  discussed in detail the circumstances of all her previous venous thromboembolic events. -We discussed that the current extensive IVC thrombosis with bilateral iliac vein and right lower extremity DVT appears to be at least partly related to IVC stenosis from potential thrombosis of her IVC filter as well as IVC stenosis  scarring from a longstanding IVC filter.  In addition she was off Coumadin and has had subtherapeutic INRs for at least 2 weeks.  Also after holding her Coumadin she restarted her Coumadin without bridging Lovenox which can be a risk factor for thrombosis in the context of protein C deficiency. (Since protein C levels can drop faster than the levels of procoagulants with the use of Coumadin leading to prothrombotic state before Coumadin can become therapeutic). -Patient notes that it has been quite burdensome for her to manage her Coumadin and that there is significant variation in her prothrombin time and INR's.  Also if her prothrombin time at baseline is abnormal due to liver disease anticoagulant monitoring with Coumadin might be inaccurate. -We recommended the best option for her would be to continue IV heparin till her hemoglobin and platelets are stable and there is no evidence of DIC post use of thrombolytics and then transition to therapeutic dose of Lovenox with twice daily dosing subcu for 1 to 2 months and then as outpatient transition to Eliquis instead of Coumadin. Other alternative options if Lovenox is financially not possible would be either a) Lovenox bridging and going back on Coumadin and always using Lovenox bridging while holding  coumadin and restarting Coumadin after holding it. B) Lovenox bridging for a shorter time like 2 weeks prior to transitioning to Eliquis. -Will need close follow-up with her PCP and her liver team. -Duration and recommendations of baby aspirin as per vascular surgery with regards to her IVC stent. -Follow-up with PCP to keep up-to-date with age-appropriate cancer screening.  Patient does not note any new focal symptoms suggestive of malignancy at this time. -Will need every 64-month ultrasound of the liver and AFP testing to monitor for hepatocellular carcinoma with her liver cirrhosis with her PCP or liver team.   All of the patients questions were answered  with apparent satisfaction. The patient knows to call the clinic with any problems, questions or concerns.  I spent 60 minutes counseling the patient face to face. The total time spent in the appointment was 110 minutes and more than 50% was on counseling and direct patient cares.    Sullivan Lone MD [place] AAHIVMS [facility] Hematology/Oncology Physician [facility]  (Office):       [phone] (Work cell):  [phone] (Fax):           [phone]  02/01/2023 10:08 AM

## 2023-02-01 NOTE — Progress Notes (Signed)
Sutures removed from behind bilateral knees and right thigh. Right thigh started to bleed. Pressure held. Heparin off X 1 hour. Dr. Virl Cagey in and applied pressure and a suture to right thigh and an ace wrap to right leg. Compression stocking applied to left leg.

## 2023-02-02 ENCOUNTER — Other Ambulatory Visit (HOSPITAL_COMMUNITY): Payer: Self-pay

## 2023-02-02 DIAGNOSIS — I82491 Acute embolism and thrombosis of other specified deep vein of right lower extremity: Secondary | ICD-10-CM | POA: Diagnosis not present

## 2023-02-02 LAB — CBC
HCT: 25.6 % — ABNORMAL LOW (ref 36.0–46.0)
Hemoglobin: 8.8 g/dL — ABNORMAL LOW (ref 12.0–15.0)
MCH: 32.7 pg (ref 26.0–34.0)
MCHC: 34.4 g/dL (ref 30.0–36.0)
MCV: 95.2 fL (ref 80.0–100.0)
Platelets: 106 10*3/uL — ABNORMAL LOW (ref 150–400)
RBC: 2.69 MIL/uL — ABNORMAL LOW (ref 3.87–5.11)
RDW: 16.4 % — ABNORMAL HIGH (ref 11.5–15.5)
WBC: 5.5 10*3/uL (ref 4.0–10.5)
nRBC: 0.5 % — ABNORMAL HIGH (ref 0.0–0.2)

## 2023-02-02 LAB — BASIC METABOLIC PANEL
Anion gap: 9 (ref 5–15)
BUN: <5 mg/dL — ABNORMAL LOW (ref 6–20)
CO2: 23 mmol/L (ref 22–32)
Calcium: 7.5 mg/dL — ABNORMAL LOW (ref 8.9–10.3)
Chloride: 103 mmol/L (ref 98–111)
Creatinine, Ser: 0.54 mg/dL (ref 0.44–1.00)
GFR, Estimated: >60 mL/min (ref >60)
Glucose, Bld: 172 mg/dL — ABNORMAL HIGH (ref 70–99)
Potassium: 3.6 mmol/L (ref 3.5–5.1)
Sodium: 135 mmol/L (ref 135–145)

## 2023-02-02 LAB — GLUCOSE, CAPILLARY
Glucose-Capillary: 173 mg/dL — ABNORMAL HIGH (ref 70–99)
Glucose-Capillary: 161 mg/dL — ABNORMAL HIGH (ref 70–99)
Glucose-Capillary: 160 mg/dL — ABNORMAL HIGH (ref 70–99)
Glucose-Capillary: 168 mg/dL — ABNORMAL HIGH (ref 70–99)
Glucose-Capillary: 116 mg/dL — ABNORMAL HIGH (ref 70–99)

## 2023-02-02 LAB — PROTIME-INR
INR: 1.9 — ABNORMAL HIGH (ref 0.8–1.2)
Prothrombin Time: 21.6 seconds — ABNORMAL HIGH (ref 11.4–15.2)

## 2023-02-02 LAB — HEPARIN LEVEL (UNFRACTIONATED)
Heparin Unfractionated: 0.15 IU/mL — ABNORMAL LOW (ref 0.30–0.70)
Heparin Unfractionated: 0.3 IU/mL (ref 0.30–0.70)

## 2023-02-02 MED ORDER — ENOXAPARIN SODIUM 80 MG/0.8ML IJ SOSY
80.0000 mg | PREFILLED_SYRINGE | Freq: Two times a day (BID) | INTRAMUSCULAR | 1 refills | Status: DC
  Filled 2023-02-02: qty 44.8, 28d supply, fill #0

## 2023-02-02 MED ORDER — INSULIN ASPART 100 UNIT/ML IJ SOLN
0.0000 [IU] | Freq: Three times a day (TID) | INTRAMUSCULAR | Status: DC
  Administered 2023-02-02: 4 [IU] via SUBCUTANEOUS

## 2023-02-02 MED ORDER — INSULIN ASPART 100 UNIT/ML IJ SOLN
0.0000 [IU] | Freq: Three times a day (TID) | INTRAMUSCULAR | Status: DC
  Administered 2023-02-02 – 2023-02-03 (×4): 3 [IU] via SUBCUTANEOUS
  Administered 2023-02-04: 2 [IU] via SUBCUTANEOUS
  Administered 2023-02-05: 1 [IU] via SUBCUTANEOUS
  Administered 2023-02-05 – 2023-02-08 (×6): 2 [IU] via SUBCUTANEOUS

## 2023-02-02 MED ORDER — MORPHINE SULFATE (PF) 2 MG/ML IV SOLN: 5.0000 mg | INTRAVENOUS | Status: DC | PRN

## 2023-02-02 MED ORDER — INSULIN GLARGINE-YFGN 100 UNIT/ML ~~LOC~~ SOLN
12.0000 [IU] | Freq: Every day | SUBCUTANEOUS | Status: DC
  Administered 2023-02-02: 12 [IU] via SUBCUTANEOUS
  Filled 2023-02-02 (×4): qty 0.12

## 2023-02-02 NOTE — Progress Notes (Signed)
Contacted pt's spouse to set up post hospital appointment. Pt's spouse aware of location of [place] and date and time.

## 2023-02-02 NOTE — Progress Notes (Signed)
The pt accidentally removed arterial line while moving in bed. This nurse held direct pressure to site and vascular MD paged. Pt noted to be anxious and reports that she is concerned d/t bleeding after suture removal on right leg earlier in the day. Assistance from other nurses to provide care to pt. Pt educated in relaxation techniques while pressure remains being held on arterial line site. The pt's vitals remain stable throughout care. MD reported to continue to hold pressure at this time and that there were no new orders. Pressure held on site for 25 minutes. Level 1 hematoma noted after pressure removed and bleeding noted to cease. No pain or complaints from the patient about the site. Education provided about site management and elevation of the limb. Pressure dressing applied to site. No new bleeding noted. Pt re-educated on the need for limb elevation without questions.

## 2023-02-02 NOTE — Progress Notes (Signed)
PROGRESS NOTE Shelby Conner  [id number] DOB: November 18, 1971 DOA: 01/27/2023 PCP: Laverle Hobby, NP   Brief Narrative/Hospital Course: 52 y.o. female with medical history significant for b/l PE while 8 month pregnant in jan 2006,proximal DVT in 2010, hx of protein s and C deficiency maintained on woumadin, liver cirrhoses followed at [facility] transplant center, and DM2 who presented with right lower extremity edema that began 3 days PTA.  Her chronic Coumadin was held for dental procedure 2 and half week go and inr was as low as 1.4, she had done that in the past.  In the ED underwent further workup: CT abdomen pelvis revealed: hepatic cirrhosis with portal venous hypertension including multiple upper abdominal and esophageal varices as well as mild splenic enlargement.  Inferior vena caval filter is in place with probable venous thrombosis below this level and extending into the pelvic veins. Infiltration in the right iliopsoas region and soft tissues around the right hip likely indicate edema. Vascular surgery was consulted  She was admitted to ICU on heparin drip S/P pharmacomechanical thrombectomy with filter removal and ivc stent placement, coumadin resumed. She needed multiple prbc transfusions for ABLA.   Subjective: Seen and examined this morning. Resting in the bedside chair. Complains of sore throat. Overnight afebrile BP stable  A LINE got dislodged pressure applied, had bleed from 1 leg wound pressure and sutures were applied along with dressing  Labs shows hemoglobin at 8.8 g.  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 Currently maintained on Coumadin and heparin. On asa 81 pe vvs.  INR subtherapeutic 1.9 today.  Patient is anxious and worried about being maintained on Coumadin - anxious that she does not have filter anymore and has a stent instead, she feels she will not be able to maintain coumadin at therapeutic range consistently and-She feels she will need to check Coumadin daily at home since she no longer has filter-while her insurance only approves weekly strip for home monitor.  States Lovenox will not be covered by insurance. She is wondering if she could switch to different anticoagulant, previously on Xarelto but was switched to Coumadin given her new liver cirrhosis and AKI per chart in 2021.  She reports she did follow-up with a liver transplant at [facility] and was declined transplant and states she is doing well, missed her last appointment.  Input appreciated from Dr. Irene Limbo: Discussed various options with the patient, best option would be to continue heparin until her hemoglobin and platelets are stable and then transition to therapeutic dose of Lovenox twice daily for 1 to 2 months then outpatient transition to Eliquis-other alternatives: 1) Lovenox bridging and going back on Coumadin and always using Lovenox bridging while holding Coumadin and restarting Coumadin after holding it, b)Lovenox bridging for shorter time like 2 weeks prior to transition to Eliquis.  Await final decision from hematology> if decided for Lovenox we could arrange today for weekend  discharge- I sent him message. Per pharmacy Lovenox copay is $15/month, DOAC is $50   Recent Labs  Lab 01/27/23 1630 01/31/23 0620 02/01/23 0450 02/02/23 0325  INR 1.4* 1.7* 2.7* 1.9*   Thrombocytopenia:2/39fliver  cirrhosis, acute DVT, also on heparin.Monitor closely while on heparin. Recent Labs  Lab 01/31/23 0749 01/31/23 1400 01/31/23 1930 02/01/23 0450 02/02/23 0325  PLT 92* 91* 81* 85* 106*   Anemia of acute blood loss: On admission 12 and hemoglobin decreased due to acute blood loss anemia due to thrombolytics and procedure.  Status post multiple PRBC transfusions.Hemoglobin up monitor closely until stabilization. Recent Labs    01/31/23 1400 01/31/23 1930 02/01/23 0450 02/01/23 1851 02/02/23 0325  HGB 7.4* 8.6* 7.0* 9.3* 8.8*  MCV 98.2 93.7 95.8  --  95.2  Hypomagnesemia:resolved Hypokalemia: Resolved   T2DM with hyperglycemia: A1c 11.2, poorly controlled.she used to be on metformin 500 mg and injectable medication that she does not know the name.DM coordinator following.Started long-acting insulin 5 u bedtime>  received 23-33 units aspart in the last 2 days > increase Lantus to 12 units and cont SSI. Recent Labs  Lab 01/28/23 0002 01/28/23 0020 02/01/23 2023 02/01/23 2323 02/02/23 0320 02/02/23 0803 02/02/23 1118  GLUCAP  --    < > 182* 166* 173* 161* 160*  HGBA1C 11.2*  --   --   --   --   --   --    < > = values in this interval not displayed.   History of cirrhosis with portal venous hypertension with abdominal and esophageal varices/splenic enlargement: Currently stable.Last viral hepatitis panel 21 was negative. She reports [facility] and states she is doing well, missed her last appointment ( [id number]). last MELD in 15   Hyponatremia improved, TSH slightly up recheck outpatient  DVT prophylaxis: Heparin infusion Code Status:   Code Status: Full Code Family Communication: plan of care discussed with patient at bedside. Patient status is: Inpatient because of acute DVT needing thrombolytics Level of care: Progressive > okay to transfer to floor today  Dispo: The patient is from: home             Anticipated disposition:Home over the weekend once cleared by vascular and hematology  Objective: Vitals last 24 hrs: Vitals:   02/02/23 0800 02/02/23 0807 02/02/23 1100 02/02/23 1121  BP: 121/64  110/73   Pulse: 64     Resp: 19  19   Temp:  97.8 F (36.6 C)  98 F (36.7 C)  TempSrc:  Oral  Oral  SpO2: 94%     Weight:      Height:       Weight change:  Physical Examination: General exam: AA OS 3, weak,older appearing HEENT:Oral mucosa moist, Ear/Nose WNL grossly, dentition normal. Respiratory system: bilaterally clear BS, no use of accessory muscle Cardiovascular system: S1 & S2 +, regular rate,. Gastrointestinal system: Abdomen soft, NT,ND,BS+ Nervous System:Alert, awake, moving extremities and grossly nonfocal Extremities: LE ankle edema neg-compression stocking present on bilateral feet Skin: No rashes,no icterus. MSK: Normal muscle bulk,tone, power   Medications reviewed:  Scheduled Meds:  sodium chloride   Intravenous Once   sodium chloride   Intravenous Once   aspirin EC  81 mg Oral Daily   chlorhexidine  15 mL Mouth/Throat Once   Or   mouth rinse  15 mL Mouth Rinse Once   Chlorhexidine Gluconate Cloth  6 each Topical Q0600  Chlorhexidine Gluconate Cloth  6 each Topical Daily   insulin aspart  0-15 Units Subcutaneous TID WC   insulin glargine-yfgn  12 Units Subcutaneous QHS   living well with diabetes book   Does not apply Once   mupirocin ointment  1 Application Nasal BID   potassium chloride  60 mEq Oral Daily   Warfarin - Pharmacist Dosing Inpatient   Does not apply q1600   Continuous Infusions:  sodium chloride 50 mL/hr at 02/02/23 1100   heparin 1,500 Units/hr (02/02/23 1100)   lactated ringers      Diet Order             Diet Carb Modified Fluid consistency: Thin; Room service appropriate? Yes  Diet effective now                  Intake/Output Summary (Last 24 hours) at 02/02/2023 1131 Last data filed at 02/02/2023 1100 Gross per 24 hour   Intake 2489.1 ml  Output 4975 ml  Net -2485.9 ml   Net IO Since Admission: 1,326.64 mL [02/02/23 1131]  Wt Readings from Last 3 Encounters:  01/27/23 75.1 kg  07/24/22 72.6 kg  07/13/21 59 kg     Unresulted Labs (From admission, onward)     Start     Ordered   02/01/23 0500  Heparin level (unfractionated)  Daily,   R     Question:  Specimen collection method  Answer:  Unit=Unit collect   01/31/23 0017   02/01/23 0500  Protime-INR  Daily,   R     Question:  Specimen collection method  Answer:  Unit=Unit collect   01/31/23 0017   02/01/23 [id number]  Basic metabolic panel  Daily,   R     Question:  Specimen collection method  Answer:  Unit=Unit collect   01/31/23 0745   01/29/23 0500  CBC  Daily,   R      01/28/23 2155   01/27/23 1857  Creatinine, urine, random  Once,   R        01/27/23 1856   01/27/23 1857  Osmolality, urine  Once,   R        01/27/23 1856   01/27/23 1857  Urinalysis, Complete w Microscopic -Urine, Clean Catch  Once,   R       Question Answer Comment  Release to patient Immediate   Specimen Source Urine, Clean Catch      01/27/23 1856          Data Reviewed: I have personally reviewed following labs and imaging studies CBC: Recent Labs  Lab 01/27/23 1447 01/28/23 0002 01/31/23 0749 01/31/23 1400 01/31/23 1930 02/01/23 0450 02/01/23 1851 02/02/23 0325  WBC 10.2   < > 13.6* 13.0* 9.7 6.7  --  5.5  NEUTROABS 6.6  --   --   --   --   --   --   --   HGB 14.0   < > 8.0* 7.4* 8.6* 7.0* 9.3* 8.8*  HCT 39.4   < > 22.5* 21.7* 23.6* 20.4* 27.3* 25.6*  MCV 101.0*   < > 96.2 98.2 93.7 95.8  --  95.2  PLT PLATELET CLUMPS NOTED ON SMEAR, UNABLE TO ESTIMATE   < > 92* 91* 81* 85*  --  106*   < > = values in this interval not displayed.   Basic Metabolic Panel: Recent Labs  Lab 01/28/23 0002 01/29/23 0101 01/30/23 [id number] 01/30/23 1842 01/30/23 1946 01/30/23 2042 01/31/23 0810 02/01/23 0450 02/01/23  1851 02/02/23 0325  NA 128* 132* 135   < > 141 141  133* 139  --  135  K 3.5 3.5 3.0*   < > 3.9 4.0 3.7 3.0* 3.7 3.6  CL 95* 100 102  --  107  --  104 107  --  103  CO2 22 24 21*  --   --   --  19* 23  --  23  GLUCOSE 378* 241* 178*  --  189*  --  181* 105*  --  172*  BUN 12 12 5*  --  <3*  --  6 5*  --  <5*  CREATININE 0.80 0.64 0.56  --  0.30*  --  0.62 0.52  --  0.54  CALCIUM 8.3* 8.3* 7.8*  --   --   --  7.0* 7.4*  --  7.5*  MG 1.5* 1.7 1.6*  --   --   --   --   --   --   --   PHOS 3.7  --   --   --   --   --   --   --   --   --    < > = values in this interval not displayed.   Recent Results (from the past 240 hour(s))  Surgical pcr screen     Status: Abnormal   Collection Time: 01/29/23 10:08 AM   Specimen: Nasal Mucosa; Nasal Swab  Result Value Ref Range Status   MRSA, PCR NEGATIVE NEGATIVE Final   Staphylococcus aureus POSITIVE (A) NEGATIVE Final    Comment: (NOTE) The Xpert SA Assay (FDA approved for NASAL specimens in patients 45 years of age and older), is one component of a comprehensive surveillance program. It is not intended to diagnose infection nor to guide or monitor treatment. Performed at [facility], [place] [address], [place], [place] [address]     Antimicrobials: Anti-infectives (From admission, onward)    Start     Dose/Rate Route Frequency Ordered Stop   01/31/23 0400  ceFAZolin (ANCEF) IVPB 1 g/50 mL premix        1 g 100 mL/hr over 30 Minutes Intravenous Every 8 hours 01/30/23 2200 01/31/23 1223   01/29/23 2000  ceFAZolin (ANCEF) injection 1 g  Status:  Discontinued        1 g Intramuscular Every 8 hours 01/29/23 1205 01/29/23 1508   01/29/23 2000  ceFAZolin (ANCEF) IVPB 1 g/50 mL premix        1 g 100 mL/hr over 30 Minutes Intravenous Every 8 hours 01/29/23 1508 01/30/23 0452   01/29/23 1215  ceFAZolin (ANCEF) IVPB 2g/100 mL premix        2 g 200 mL/hr over 30 Minutes Intravenous  Once 01/29/23 1209 01/29/23 1225   01/29/23 1209  ceFAZolin (ANCEF) 2-4 GM/100ML-% IVPB       Note to Pharmacy:  Alphonsus Sias: cabinet override      01/29/23 1209 01/29/23 1251     Radiology Studies: No results found.   LOS: 5 days   Antonieta Pert, MD [facility]  02/02/2023, 11:31 AM

## 2023-02-02 NOTE — TOC Transition Note (Signed)
Transitions of Care meds (1) Locked up at inpatient pharmacy.

## 2023-02-02 NOTE — TOC Benefit Eligibility Note (Signed)
Patient Teacher, English as a foreign language completed.    The patient is currently admitted and upon discharge could be taking Eliquis 5 mg.  The current 30 day co-pay is $50.00.  Can use Copay Card  The patient is currently admitted and upon discharge could be taking Xarelto 20 mg.  The current 30 day co-pay is $50.00.  Can use Copay Card  The patient is insured through [place], [place] Patient Advocate Specialist [place] Patient Advocate Team Direct Number: [phone]  Fax: [phone]

## 2023-02-02 NOTE — Progress Notes (Addendum)
ANTICOAGULATION CONSULT NOTE - Follow-up Note  Pharmacy Consult for Heparin Indication: DVT/protein S and C deficiency  No Known Allergies  Patient Measurements: Height: 5' 4"$  (162.6 cm) Weight: 75.1 kg (165 lb 9.1 oz) IBW/kg (Calculated) : 54.7 Heparin Dosing Weight: 70kg  Vital Signs: Temp: 98.3 F (36.8 C) (02/09 1300) Temp Source: Oral (02/09 1300) BP: 145/76 (02/09 1300) Pulse Rate: 68 (02/09 1300)  Labs: Recent Labs    01/31/23 0620 01/31/23 0749 01/31/23 0810 01/31/23 1400 01/31/23 1930 02/01/23 0450 02/01/23 1851 02/02/23 0325  HGB  --    < >  --  7.4* 8.6* 7.0* 9.3* 8.8*  HCT  --    < >  --  21.7* 23.6* 20.4* 27.3* 25.6*  PLT  --    < >  --  91* 81* 85*  --  106*  LABPROT 20.0*  --   --   --   --  28.2*  --  21.6*  INR 1.7*  --   --   --   --  2.7*  --  1.9*  HEPARINUNFRC 0.39  --   --  0.43  --  0.43  --  0.15*  CREATININE  --   --  0.62  --   --  0.52  --  0.54   < > = values in this interval not displayed.     Estimated Creatinine Clearance: 83.5 mL/min (by C-G formula based on SCr of 0.54 mg/dL).   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 Patient reports being s/p IVC filter. Patient is presenting with leg pain and swelling. Patient is on warfarin prior to arrival but has stopped x 5 days for gum surgery. Per patient takes 46m daily prior to that. Heparin per pharmacy consult placed for DVT.   Pt is s/o tPA and thrombectomy. Heparin and coumadin restarted 2/6 post procedure (last dose was 2/7).  -heparin level = 0.15 (previously at goal on the same rate) -INR= 1.9, hg- 8.8, plt= 106  Plans are for lovenox when CBC is stable  Goal of Therapy:  Heparin level 0.3-0.7 units/ml Monitor platelets by anticoagulation protocol: Yes   Plan:  -Increase heparin to 1700 units/hr -Heparin level in 6 hours and daily wth CBC daily -No further warfarin  AHildred Laser PharmD Clinical  Pharmacist **Pharmacist phone directory can now be found on [email or web address] ([email or web address]).  Listed under MFlasher

## 2023-02-02 NOTE — Progress Notes (Signed)
  Daily Progress Note  Dx: Iliocaval DVT extending into the right leg, currently undergoing thrombolysis.  Subjective: Leg softer, neck pain, poor appetite.   Objective: Vitals:   02/02/23 0700 02/02/23 0807  BP: 115/76   Pulse: 69   Resp: 15   Temp:  97.8 F (36.6 C)  SpO2: 91%     Physical Examination Palpable pulses in the feet bilaterally Less edema in the right lower extremity Sensorimotor intact Nonlabored breathing Regular rate, normal rhythm   ASSESSMENT/PLAN:  Patient is a 52 year old female with known protein C, protein S deficiency, recently taken off of her warfarin for a dental procedure.  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  The thrombus propagates down both iliac veins, and continues into the right lower extremity.  Now s/p percutaneous pharmacomechanical thrombectomy with filter removal. IVC stent placement.  There was a tine that migrated to the lung, No plan for retrieval.  Overall leg is much better.  Hematology involvement-will defer anticoagulation to them.  Floor today Bilateral thigh compression stockings De-line Up out of bed as tolerated with nursing, PT, OT. Home tomorrow pending improvement.    Cassandria Santee MD [facility] [phone] 02/02/2023  8:30 AM

## 2023-02-02 NOTE — Progress Notes (Signed)
ANTICOAGULATION CONSULT NOTE - Follow-up Note  Pharmacy Consult for Heparin Indication: DVT/protein S and C deficiency  No Known Allergies  Patient Measurements: Height: 5' 4"$  (162.6 cm) Weight: 75.1 kg (165 lb 9.1 oz) IBW/kg (Calculated) : 54.7 Heparin Dosing Weight: 70kg  Vital Signs: Temp: 98.2 F (36.8 C) (02/09 2033) Temp Source: Oral (02/09 2033) BP: 141/79 (02/09 2033) Pulse Rate: 65 (02/09 2033)  Labs: Recent Labs    01/31/23 0620 01/31/23 0749 01/31/23 0810 01/31/23 1400 01/31/23 1930 02/01/23 0450 02/01/23 1851 02/02/23 0325 02/02/23 2207  HGB  --    < >  --    < > 8.6* 7.0* 9.3* 8.8*  --   HCT  --    < >  --    < > 23.6* 20.4* 27.3* 25.6*  --   PLT  --    < >  --    < > 81* 85*  --  106*  --   LABPROT 20.0*  --   --   --   --  28.2*  --  21.6*  --   INR 1.7*  --   --   --   --  2.7*  --  1.9*  --   HEPARINUNFRC 0.39  --   --    < >  --  0.43  --  0.15* 0.30  CREATININE  --   --  0.62  --   --  0.52  --  0.54  --    < > = values in this interval not displayed.     Estimated Creatinine Clearance: 83.5 mL/min (by C-G formula based on SCr of 0.54 mg/dL).   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 Patient reports being s/p IVC filter. Patient is presenting with leg pain and swelling. Patient is on warfarin prior to arrival but has stopped x 5 days for gum surgery. Per patient takes 42m daily prior to that. Heparin per pharmacy consult placed for DVT.   Pt is s/p tPA and thrombectomy. Heparin and coumadin restarted 2/6 post procedure. -heparin level 0.3 (therapeutic) on infusion at 1700 units/hr. No overt bleeding noted.  Plans are for lovenox when CBC is stable  Goal of Therapy:  Heparin level 0.3-0.7 units/ml Monitor platelets by anticoagulation protocol: Yes   Plan:  -Continue heparin at 1700 units/hr -Heparin level in a.m to confirm therapeutic  CSherlon Handing PharmD, BCPS Please see  amion for complete clinical pharmacist phone list 02/02/2023 10:38 PM

## 2023-02-02 NOTE — Progress Notes (Signed)
Patient arrived from [facility] to 4E.  Vital signs stable.  No complaints.  02/02/23 1300  Vitals  Temp 98.3 F (36.8 C)  Temp Source Oral  BP (!) 145/76  MAP (mmHg) 97  BP Location Right Arm  BP Method Automatic  Patient Position (if appropriate) Sitting  Pulse Rate 68  Pulse Rate Source Monitor  ECG Heart Rate 68  Level of Consciousness  Level of Consciousness Alert  Oxygen Therapy  SpO2 96 %  O2 Device Room Air  Pain Assessment  Pain Scale 0-10  Pain Score 6

## 2023-02-03 ENCOUNTER — Inpatient Hospital Stay (HOSPITAL_COMMUNITY): Payer: BC Managed Care – PPO

## 2023-02-03 DIAGNOSIS — I82491 Acute embolism and thrombosis of other specified deep vein of right lower extremity: Secondary | ICD-10-CM | POA: Diagnosis not present

## 2023-02-03 LAB — TYPE AND SCREEN
ABO/RH(D): O POS
Antibody Screen: NEGATIVE
Unit division: 0
Unit division: 0
Unit division: 0
Unit division: 0
Unit division: 0
Unit division: 0

## 2023-02-03 LAB — CBC
HCT: 23.6 % — ABNORMAL LOW (ref 36.0–46.0)
HCT: 26.2 % — ABNORMAL LOW (ref 36.0–46.0)
Hemoglobin: 7.9 g/dL — ABNORMAL LOW (ref 12.0–15.0)
Hemoglobin: 8.7 g/dL — ABNORMAL LOW (ref 12.0–15.0)
MCH: 32.4 pg (ref 26.0–34.0)
MCH: 32.2 pg (ref 26.0–34.0)
MCHC: 33.5 g/dL (ref 30.0–36.0)
MCHC: 33.2 g/dL (ref 30.0–36.0)
MCV: 96.7 fL (ref 80.0–100.0)
MCV: 97 fL (ref 80.0–100.0)
Platelets: 111 10*3/uL — ABNORMAL LOW (ref 150–400)
Platelets: 143 10*3/uL — ABNORMAL LOW (ref 150–400)
RBC: 2.44 MIL/uL — ABNORMAL LOW (ref 3.87–5.11)
RBC: 2.7 MIL/uL — ABNORMAL LOW (ref 3.87–5.11)
RDW: 15.9 % — ABNORMAL HIGH (ref 11.5–15.5)
RDW: 16.1 % — ABNORMAL HIGH (ref 11.5–15.5)
WBC: 5.6 10*3/uL (ref 4.0–10.5)
WBC: 7.8 10*3/uL (ref 4.0–10.5)
nRBC: 0.9 % — ABNORMAL HIGH (ref 0.0–0.2)
nRBC: 0.8 % — ABNORMAL HIGH (ref 0.0–0.2)

## 2023-02-03 LAB — BASIC METABOLIC PANEL
Anion gap: 8 (ref 5–15)
BUN: <5 mg/dL — ABNORMAL LOW (ref 6–20)
CO2: 25 mmol/L (ref 22–32)
Calcium: 7.7 mg/dL — ABNORMAL LOW (ref 8.9–10.3)
Chloride: 104 mmol/L (ref 98–111)
Creatinine, Ser: 0.65 mg/dL (ref 0.44–1.00)
GFR, Estimated: >60 mL/min (ref >60)
Glucose, Bld: 139 mg/dL — ABNORMAL HIGH (ref 70–99)
Potassium: 3.5 mmol/L (ref 3.5–5.1)
Sodium: 137 mmol/L (ref 135–145)

## 2023-02-03 LAB — PROTIME-INR
INR: 1.5 — ABNORMAL HIGH (ref 0.8–1.2)
Prothrombin Time: 17.5 seconds — ABNORMAL HIGH (ref 11.4–15.2)

## 2023-02-03 LAB — HEPARIN LEVEL (UNFRACTIONATED): Heparin Unfractionated: 0.3 IU/mL (ref 0.30–0.70)

## 2023-02-03 LAB — GLUCOSE, CAPILLARY
Glucose-Capillary: 106 mg/dL — ABNORMAL HIGH (ref 70–99)
Glucose-Capillary: 127 mg/dL — ABNORMAL HIGH (ref 70–99)
Glucose-Capillary: 128 mg/dL — ABNORMAL HIGH (ref 70–99)
Glucose-Capillary: 170 mg/dL — ABNORMAL HIGH (ref 70–99)
Glucose-Capillary: 89 mg/dL (ref 70–99)

## 2023-02-03 LAB — BPAM RBC
Blood Product Expiration Date: 202403092359
Blood Product Expiration Date: 202403092359
Blood Product Expiration Date: 202403092359
Blood Product Expiration Date: 202403092359
Blood Product Expiration Date: 202403092359
Blood Product Expiration Date: 202403092359
ISSUE DATE / TIME: 202402061954
ISSUE DATE / TIME: 202402061954
ISSUE DATE / TIME: 202402062014
ISSUE DATE / TIME: 202402071534
ISSUE DATE / TIME: 202402080641
ISSUE DATE / TIME: 202402080908
Unit Type and Rh: 5100
Unit Type and Rh: 5100
Unit Type and Rh: 5100
Unit Type and Rh: 5100
Unit Type and Rh: 5100
Unit Type and Rh: 5100

## 2023-02-03 LAB — HEMOGLOBIN AND HEMATOCRIT, BLOOD
HCT: 30.2 % — ABNORMAL LOW (ref 36.0–46.0)
Hemoglobin: 9.7 g/dL — ABNORMAL LOW (ref 12.0–15.0)

## 2023-02-03 MED ORDER — IOHEXOL 350 MG/ML SOLN: 75.0000 mL | Freq: Once | INTRAVENOUS | Status: DC | PRN

## 2023-02-03 MED ORDER — POTASSIUM CHLORIDE CRYS ER 20 MEQ PO TBCR
40.0000 meq | EXTENDED_RELEASE_TABLET | Freq: Every day | ORAL | Status: DC
  Administered 2023-02-04: 40 meq via ORAL
  Filled 2023-02-03 (×2): qty 2

## 2023-02-03 MED ORDER — MORPHINE SULFATE (PF) 2 MG/ML IV SOLN
5.0000 mg | INTRAVENOUS | Status: DC | PRN
  Administered 2023-02-03 – 2023-02-04 (×5): 5 mg via INTRAVENOUS
  Filled 2023-02-03 (×5): qty 3

## 2023-02-03 MED ORDER — IOHEXOL 350 MG/ML SOLN
75.0000 mL | Freq: Once | INTRAVENOUS | Status: AC | PRN
  Administered 2023-02-03: 75 mL via INTRAVENOUS

## 2023-02-03 NOTE — Plan of Care (Signed)
  Problem: Health Behavior/Discharge Planning: °Goal: Ability to manage health-related needs will improve °Outcome: Progressing °  °Problem: Skin Integrity: °Goal: Risk for impaired skin integrity will decrease °Outcome: Progressing °  °

## 2023-02-03 NOTE — Progress Notes (Addendum)
  Progress Note    02/03/2023 7:20 AM 4 Days Post-Op  Subjective:  c/o severe right flank pain that started about an hour earlier.  Says she was up walking yesterday and had no pain in the right flank and started suddenly this morning.   afebrile  Vitals:   02/02/23 2033 02/03/23 0428  BP: (!) 141/79 123/76  Pulse: 65 (!) 58  Resp:  17  Temp: 98.2 F (36.8 C) 98.1 F (36.7 C)  SpO2:  98%    Physical Exam: General:  mild distress Cardiac:  regular Lungs:  non labored Extremities:  palpable DP pulses bilaterally Abdomen:  right flank with some ecchymosis and tender to palpation  CBC    Component Value Date/Time   WBC 5.6 02/03/2023 0457   RBC 2.44 (L) 02/03/2023 0457   HGB 7.9 (L) 02/03/2023 0457   HCT 23.6 (L) 02/03/2023 0457   PLT 111 (L) 02/03/2023 0457   MCV 96.7 02/03/2023 0457   MCH 32.4 02/03/2023 0457   MCHC 33.5 02/03/2023 0457   RDW 15.9 (H) 02/03/2023 0457   LYMPHSABS 2.6 01/27/2023 1447   MONOABS 0.6 01/27/2023 1447   EOSABS 0.2 01/27/2023 1447   BASOSABS 0.1 01/27/2023 1447    BMET    Component Value Date/Time   NA 137 02/03/2023 0457   K 3.5 02/03/2023 0457   CL 104 02/03/2023 0457   CO2 25 02/03/2023 0457   GLUCOSE 139 (H) 02/03/2023 0457   BUN <5 (L) 02/03/2023 0457   CREATININE 0.65 02/03/2023 0457   CALCIUM 7.7 (L) 02/03/2023 0457   GFRNONAA >60 02/03/2023 0457   GFRAA  10/22/2009 0450    >60        The eGFR has been calculated using the MDRD equation. This calculation has not been validated in all clinical situations. eGFR's persistently <60 mL/min signify possible Chronic Kidney Disease.    INR    Component Value Date/Time   INR 1.5 (H) 02/03/2023 0457     Intake/Output Summary (Last 24 hours) at 02/03/2023 0720 Last data filed at 02/03/2023 0026 Gross per 24 hour  Intake 1114.02 ml  Output --  Net 1114.02 ml      Assessment/Plan:  52 y.o. female is s/p:  percutaneous pharmacomechanical thrombectomy with filter  removal. IVC stent placement.   4 Days Post-Op   -pt with sudden onset of right flank pain earlier this morning.  CTA a/p with delayed imaging ordered to evaluate.  Will also check hgb to see if any change from this morning.  -bed rest until Dr. Trula Slade has reviewed CT scan -hgb 7.9 down from 8.8 yesterday.  BP tolerating.  Transfuse per primary team.  -DVT prophylaxis:  coumadin.  INR 1.5 today down from 1.9 yesterday.  Also heparin gtt.    Leontine Locket, PA-C [facility] [phone] 02/03/2023 7:20 AM  I agree with the above.  I have seen and evaluate the patient.  She has new onset right flank pain.  She did have bleeding from her femoral cannulation site when the bolster suture was removed.  Referral sent for CT scan to evaluate for retroperitoneal bleed.  Annamarie Major

## 2023-02-03 NOTE — Progress Notes (Signed)
PROGRESS NOTE Shelby Conner  [id number] DOB: 05-09-1971 DOA: 01/27/2023 PCP: Laverle Hobby, NP   Brief Narrative/Hospital Course: 52 y.o. female with medical history significant for b/l PE while 8 month pregnant in jan 2006,proximal DVT in 2010, hx of protein s and C deficiency maintained on woumadin, liver cirrhoses followed at [facility] transplant center, and DM2 who presented with right lower extremity edema that began 3 days PTA.  Her chronic Coumadin was held for dental procedure 2 and half week go and inr was as low as 1.4, she had done that in the past.  In the ED underwent further workup: CT abdomen pelvis revealed: hepatic cirrhosis with portal venous hypertension including multiple upper abdominal and esophageal varices as well as mild splenic enlargement.  Inferior vena caval filter is in place with probable venous thrombosis below this level and extending into the pelvic veins. Infiltration in the right iliopsoas region and soft tissues around the right hip likely indicate edema. Vascular surgery was consulted  She was admitted to ICU on heparin drip S/P pharmacomechanical thrombectomy with filter removal and ivc stent placement, coumadin resumed. She needed multiple prbc transfusions for ABLA.  Subjective: Seen and examined this morning.   Patient had sudden right flank pain this morning earlier took pain medication p.o. and IV and pain is improved. Has bruise on the right flank and vaginal area.  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 Currently maintained on heparin and once cbc stable and ok w/ vascular then plan to switch to lovenox  then transition to Eliquis by hematology/Oncology as outpatient.On asa 81 per vvs.  Input appreciated from Dr. Irene Limbo: he discussed various options with the patient, best option would be to continue heparin until her hemoglobin and platelets are stable and then transition to therapeutic dose of Lovenox twice daily for 1 to 2 months then outpatient transition to Eliquis-other alternatives:1) Lovenox bridging and going back on Coumadin and always using Lovenox bridging while holding Coumadin and restarting Coumadin after holding it, b)Lovenox bridging for shorter time like 2 weeks prior to transition to Eliquis.Per pharmacy Lovenox copay is $15/month, DOAC is $50.   Recent Labs  Lab 01/27/23 1630 01/31/23 0620 02/01/23 0450 02/02/23 0325 02/03/23 0457  INR 1.4* 1.7* 2.7* 1.9* 1.5*  Thrombocytopenia:2/66fliver cirrhosis, acute DVT, also on heparin.platelet counts improving continue to monitor.   Recent Labs  Lab 01/31/23 1930 02/01/23 0450 02/02/23 0325 02/03/23 0457 02/03/23 1018  PLT 81* 85* 106* 111* 143*   Anemia of acute blood loss:On admission 12 and hemoglobin decreased due to acute blood loss anemia due to thrombolytics and procedure.  Status post multiple PRBC transfusions.Hemoglobin up further this am. Cont to monitor closely until stabilization. Recent Labs    02/01/23 0450 02/01/23 1851 02/02/23 0325 02/03/23 0457 02/03/23 1018  HGB 7.0* 9.3* 8.8* 7.9* 8.7*  MCV 95.8  --  95.2 96.7 97.0   Right flank pain currently improved with  pain medication, CT angio abdomen and pelvis ordered to evaluate for possible retroperitoneal bleed as per vascular.   Hypomagnesemia:resolved Hypokalemia: Resolved- on kdur 60 daily.  T2DM with hyperglycemia:  A1c 11.2, poorly controlled.she used to be on metformin 500 mg and injectable medication that she does not know the name.DM coordinator following.Started long-acting insulin 5 u bedtime>  received 23-33 units aspart in the last 2 days > increased Lantus to 12 units and cont SSI- cbg stable now. Recent Labs  Lab 01/28/23 0002 01/28/23 0020 02/02/23 0803 02/02/23 1118 02/02/23 1807 02/02/23 2032 02/03/23 0623  GLUCAP  --    < > 161* 160* 168* 116* 170*  HGBA1C 11.2*  --   --   --   --   --   --    < > = values in this interval not displayed.   History of cirrhosis with portal venous hypertension with abdominal and esophageal varices/splenic enlargement: Currently stable.Last viral hepatitis panel 21 was negative. She reports she did follow-up with a liver transplant at [facility] and was declined transplant and states she is doing well, missed her last appointment ( [id number]). last MELD in 15   Hyponatremia improved, TSH slightly up recheck outpatient  Nonobstructive right nephrolithiasis incidentally noted  Cholelithiasis incidentally noted: Asymptomatic will need outpatient follow-up with PCP  DVT prophylaxis: Heparin infusion Code Status:   Code Status: Full Code Family Communication: plan of care discussed with patient at bedside. Patient status is: Inpatient because of acute DVT needing thrombolytics Level of care: Progressive   Dispo: The patient is from: home            Anticipated disposition:Home over the weekend once cleared by vascular and hematology  Objective: Vitals last 24 hrs: Vitals:   02/02/23 1700 02/02/23 2033 02/03/23 0428 02/03/23 0755  BP: 130/72 (!) 141/79 123/76 113/65  Pulse: 80 65 (!) 58 67  Resp: 14  17 19  $ Temp: 98.3 F (36.8 C) 98.2 F (36.8 C) 98.1 F (36.7 C) 98.2 F (36.8 C)  TempSrc: Oral Oral Oral Oral  SpO2: 93%  98% 94%  Weight:      Height:       Weight change:  Physical Examination: General exam: AAox3, weak,older appearing HEENT:Oral  mucosa moist, Ear/Nose WNL grossly, dentition normal. Respiratory system: bilaterally clear BS,no use of accessory muscle Cardiovascular system: S1 & S2 +, regular rate. Gastrointestinal system: Abdomen soft, bruise on rt flank and vaginal area visibile externally Nervous System:Alert, awake, moving extremities and grossly nonfocal Extremities: LE ankle edema neg, lower extremities warm Skin: No rashes,no icterus. MSK: Normal muscle bulk,tone, power   Medications reviewed:  Scheduled Meds:  sodium chloride   Intravenous Once   sodium chloride   Intravenous Once   aspirin EC  81 mg Oral Daily   chlorhexidine  15 mL Mouth/Throat Once   Or   mouth rinse  15 mL Mouth Rinse Once   insulin aspart  0-15 Units Subcutaneous TID WC   insulin glargine-yfgn  12 Units Subcutaneous QHS   potassium chloride  60 mEq Oral Daily   Continuous Infusions:  sodium chloride 50 mL/hr at 02/03/23 0439   heparin 1,700 Units/hr (02/02/23 2040)   lactated ringers      Diet Order             Diet Carb Modified Fluid consistency: Thin; Room service appropriate? Yes  Diet effective now  Intake/Output Summary (Last 24 hours) at 02/03/2023 1052 Last data filed at 02/03/2023 0026 Gross per 24 hour  Intake 954.09 ml  Output --  Net 954.09 ml   Net IO Since Admission: 2,215.71 mL [02/03/23 1052]  Wt Readings from Last 3 Encounters:  01/27/23 75.1 kg  07/24/22 72.6 kg  07/13/21 59 kg     Unresulted Labs (From admission, onward)     Start     Ordered   02/01/23 0500  Heparin level (unfractionated)  Daily,   R     Question:  Specimen collection method  Answer:  Unit=Unit collect   01/31/23 0017   02/01/23 [id number]  Basic metabolic panel  Daily,   R     Question:  Specimen collection method  Answer:  Unit=Unit collect   01/31/23 0745   01/29/23 0500  CBC  Daily,   R      01/28/23 2155          Data Reviewed: I have personally reviewed following labs and imaging  studies CBC: Recent Labs  Lab 01/27/23 1447 01/28/23 0002 01/31/23 1930 02/01/23 0450 02/01/23 1851 02/02/23 0325 02/03/23 0457 02/03/23 1018  WBC 10.2   < > 9.7 6.7  --  5.5 5.6 7.8  NEUTROABS 6.6  --   --   --   --   --   --   --   HGB 14.0   < > 8.6* 7.0* 9.3* 8.8* 7.9* 8.7*  HCT 39.4   < > 23.6* 20.4* 27.3* 25.6* 23.6* 26.2*  MCV 101.0*   < > 93.7 95.8  --  95.2 96.7 97.0  PLT PLATELET CLUMPS NOTED ON SMEAR, UNABLE TO ESTIMATE   < > 81* 85*  --  106* 111* 143*   < > = values in this interval not displayed.   Basic Metabolic Panel: Recent Labs  Lab 01/28/23 0002 01/29/23 0101 01/30/23 0528 01/30/23 1842 01/30/23 1946 01/30/23 2042 01/31/23 0810 02/01/23 0450 02/01/23 1851 02/02/23 0325 02/03/23 0457  NA 128* 132* 135   < > 141 141 133* 139  --  135 137  K 3.5 3.5 3.0*   < > 3.9 4.0 3.7 3.0* 3.7 3.6 3.5  CL 95* 100 102  --  107  --  104 107  --  103 104  CO2 22 24 21*  --   --   --  19* 23  --  23 25  GLUCOSE 378* 241* 178*  --  189*  --  181* 105*  --  172* 139*  BUN 12 12 5*  --  <3*  --  6 5*  --  <5* <5*  CREATININE 0.80 0.64 0.56  --  0.30*  --  0.62 0.52  --  0.54 0.65  CALCIUM 8.3* 8.3* 7.8*  --   --   --  7.0* 7.4*  --  7.5* 7.7*  MG 1.5* 1.7 1.6*  --   --   --   --   --   --   --   --   PHOS 3.7  --   --   --   --   --   --   --   --   --   --    < > = values in this interval not displayed.   Recent Results (from the past 240 hour(s))  Surgical pcr screen     Status: Abnormal   Collection Time: 01/29/23 10:08 AM   Specimen: Nasal Mucosa; Nasal  Swab  Result Value Ref Range Status   MRSA, PCR NEGATIVE NEGATIVE Final   Staphylococcus aureus POSITIVE (A) NEGATIVE Final    Comment: (NOTE) The Xpert SA Assay (FDA approved for NASAL specimens in patients 89 years of age and older), is one component of a comprehensive surveillance program. It is not intended to diagnose infection nor to guide or monitor treatment. Performed at [facility], [place] [address], [place], [place] [address]     Antimicrobials: Anti-infectives (From admission, onward)    Start     Dose/Rate Route Frequency Ordered Stop   01/31/23 0400  ceFAZolin (ANCEF) IVPB 1 g/50 mL premix        1 g 100 mL/hr over 30 Minutes Intravenous Every 8 hours 01/30/23 2200 01/31/23 1223   01/29/23 2000  ceFAZolin (ANCEF) injection 1 g  Status:  Discontinued        1 g Intramuscular Every 8 hours 01/29/23 1205 01/29/23 1508   01/29/23 2000  ceFAZolin (ANCEF) IVPB 1 g/50 mL premix        1 g 100 mL/hr over 30 Minutes Intravenous Every 8 hours 01/29/23 1508 01/30/23 0452   01/29/23 1215  ceFAZolin (ANCEF) IVPB 2g/100 mL premix        2 g 200 mL/hr over 30 Minutes Intravenous  Once 01/29/23 1209 01/29/23 1225   01/29/23 1209  ceFAZolin (ANCEF) 2-4 GM/100ML-% IVPB       Note to Pharmacy: Alphonsus Sias: cabinet override      01/29/23 1209 01/29/23 1251     Radiology Studies: No results found.   LOS: 6 days   Antonieta Pert, MD [facility]  02/03/2023, 10:52 AM

## 2023-02-03 NOTE — Progress Notes (Addendum)
ANTICOAGULATION CONSULT NOTE - Follow-up Note  Pharmacy Consult for Heparin Indication: DVT/protein S and C deficiency  No Known Allergies  Patient Measurements: Height: 5' 4"$  (162.6 cm) Weight: 75.1 kg (165 lb 9.1 oz) IBW/kg (Calculated) : 54.7 Heparin Dosing Weight: 70kg  Vital Signs: Temp: 98.2 F (36.8 C) (02/10 0755) Temp Source: Oral (02/10 0755) BP: 113/65 (02/10 0755) Pulse Rate: 67 (02/10 0755)  Labs: Recent Labs    02/01/23 0450 02/01/23 1851 02/02/23 0325 02/02/23 2207 02/03/23 0457  HGB 7.0* 9.3* 8.8*  --  7.9*  HCT 20.4* 27.3* 25.6*  --  23.6*  PLT 85*  --  106*  --  111*  LABPROT 28.2*  --  21.6*  --  17.5*  INR 2.7*  --  1.9*  --  1.5*  HEPARINUNFRC 0.43  --  0.15* 0.30 0.30  CREATININE 0.52  --  0.54  --  0.65     Estimated Creatinine Clearance: 83.5 mL/min (by C-G formula based on SCr of 0.65 mg/dL).   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 Patient reports being s/p IVC filter. Patient is presenting with leg pain and swelling. Patient is on warfarin prior to arrival but has stopped x 5 days for gum surgery. Per patient takes 69m daily prior to that. Heparin per pharmacy consult placed for DVT.   Pt is s/p tPA and thrombectomy. Heparin and coumadin restarted 2/6 post procedure. Plans are for lovenox when CBC is stable.  Confirmatory heparin level 0.3 (therapeutic) on infusion at 1700 units/hr. No s/sx bleeding or issues with infusion reported by RN. Hgb 7.9 trending down this morning, PLT 111 stable.   Goal of Therapy:  Heparin level 0.3-0.7 units/ml Monitor platelets by anticoagulation protocol: Yes   Plan:  Continue heparin at 1700 units/hr Daily heparin level and CBC while on heparin Monitor s/sx bleeding  F/u plans for lovenox   CEliseo Gum PharmD PGY1 Pharmacy Resident   02/03/2023  8:22 AM   Addendum: Informed by RN that [facility] instructed to hold heparin for 4hrs  due to RP hematoma found on imaging, heparin held at 11:48. Communicated this to 2nd shift pharmacist, who will follow up on plans to resume heparin.   CEliseo Gum PharmD PGY1 Pharmacy Resident   02/03/2023  3:35 PM

## 2023-02-03 NOTE — Progress Notes (Signed)
2132 : Notified on call physician Dr. Claria Dice that pt's CBG is 67. Asked if give 12 units of semglee with snack or hold it? MD told to hold it for now and recheck CBG in  2 hrs.  2348: Recheck CBG after 2 hr and snack was 127. MD notified. Received order to hold semglee for tonight. Plan of care continues.

## 2023-02-04 DIAGNOSIS — I82491 Acute embolism and thrombosis of other specified deep vein of right lower extremity: Secondary | ICD-10-CM | POA: Diagnosis not present

## 2023-02-04 LAB — CBC
HCT: 21.4 % — ABNORMAL LOW (ref 36.0–46.0)
Hemoglobin: 7.2 g/dL — ABNORMAL LOW (ref 12.0–15.0)
MCH: 33.2 pg (ref 26.0–34.0)
MCHC: 33.6 g/dL (ref 30.0–36.0)
MCV: 98.6 fL (ref 80.0–100.0)
Platelets: 149 10*3/uL — ABNORMAL LOW (ref 150–400)
RBC: 2.17 MIL/uL — ABNORMAL LOW (ref 3.87–5.11)
RDW: 17.1 % — ABNORMAL HIGH (ref 11.5–15.5)
WBC: 9 10*3/uL (ref 4.0–10.5)
nRBC: 0.7 % — ABNORMAL HIGH (ref 0.0–0.2)

## 2023-02-04 LAB — GLUCOSE, CAPILLARY
Glucose-Capillary: 116 mg/dL — ABNORMAL HIGH (ref 70–99)
Glucose-Capillary: 142 mg/dL — ABNORMAL HIGH (ref 70–99)
Glucose-Capillary: 110 mg/dL — ABNORMAL HIGH (ref 70–99)
Glucose-Capillary: 127 mg/dL — ABNORMAL HIGH (ref 70–99)

## 2023-02-04 LAB — BASIC METABOLIC PANEL
Anion gap: 8 (ref 5–15)
BUN: <5 mg/dL — ABNORMAL LOW (ref 6–20)
CO2: 24 mmol/L (ref 22–32)
Calcium: 8.1 mg/dL — ABNORMAL LOW (ref 8.9–10.3)
Chloride: 101 mmol/L (ref 98–111)
Creatinine, Ser: 0.76 mg/dL (ref 0.44–1.00)
GFR, Estimated: >60 mL/min (ref >60)
Glucose, Bld: 113 mg/dL — ABNORMAL HIGH (ref 70–99)
Potassium: 4.2 mmol/L (ref 3.5–5.1)
Sodium: 133 mmol/L — ABNORMAL LOW (ref 135–145)

## 2023-02-04 LAB — HEPARIN LEVEL (UNFRACTIONATED): Heparin Unfractionated: <0.1 IU/mL — ABNORMAL LOW (ref 0.30–0.70)

## 2023-02-04 LAB — HEMOGLOBIN AND HEMATOCRIT, BLOOD
HCT: 21.9 % — ABNORMAL LOW (ref 36.0–46.0)
HCT: 26 % — ABNORMAL LOW (ref 36.0–46.0)
Hemoglobin: 7.3 g/dL — ABNORMAL LOW (ref 12.0–15.0)
Hemoglobin: 8.6 g/dL — ABNORMAL LOW (ref 12.0–15.0)

## 2023-02-04 LAB — PREPARE RBC (CROSSMATCH)

## 2023-02-04 MED ORDER — SODIUM CHLORIDE 0.9 % IV BOLUS
250.0000 mL | Freq: Once | INTRAVENOUS | Status: AC
  Administered 2023-02-04: 250 mL via INTRAVENOUS

## 2023-02-04 MED ORDER — DIPHENHYDRAMINE HCL 50 MG/ML IJ SOLN
25.0000 mg | Freq: Four times a day (QID) | INTRAMUSCULAR | Status: DC | PRN
  Administered 2023-02-04 – 2023-02-08 (×11): 25 mg via INTRAVENOUS
  Filled 2023-02-04 (×11): qty 1

## 2023-02-04 MED ORDER — MORPHINE SULFATE (PF) 2 MG/ML IV SOLN
2.0000 mg | INTRAVENOUS | Status: DC | PRN
  Administered 2023-02-04 – 2023-02-08 (×10): 2 mg via INTRAVENOUS
  Filled 2023-02-04 (×10): qty 1

## 2023-02-04 MED ORDER — INSULIN GLARGINE-YFGN 100 UNIT/ML ~~LOC~~ SOLN
5.0000 [IU] | Freq: Every day | SUBCUTANEOUS | Status: DC
  Administered 2023-02-04 – 2023-02-07 (×4): 5 [IU] via SUBCUTANEOUS
  Filled 2023-02-04 (×5): qty 0.05

## 2023-02-04 MED ORDER — MELATONIN 3 MG PO TABS
3.0000 mg | ORAL_TABLET | Freq: Every day | ORAL | Status: DC
  Administered 2023-02-04 – 2023-02-07 (×4): 3 mg via ORAL
  Filled 2023-02-04 (×4): qty 1

## 2023-02-04 MED ORDER — SODIUM CHLORIDE 0.9% IV SOLUTION: Freq: Once | INTRAVENOUS | Status: AC

## 2023-02-04 NOTE — Progress Notes (Signed)
Notified Dr. Claria Dice recheck H and H is 7.3, 21.9 . See mar for new orders. Plan of care continues.

## 2023-02-04 NOTE — Progress Notes (Signed)
ANTICOAGULATION CONSULT NOTE - Follow-up Note  Pharmacy Consult for Heparin Indication: DVT/protein S and C deficiency  No Known Allergies  Patient Measurements: Height: 5' 4"$  (162.6 cm) Weight: 75.1 kg (165 lb 9.1 oz) IBW/kg (Calculated) : 54.7 Heparin Dosing Weight: 70kg  Vital Signs: Temp: 97.6 F (36.4 C) (02/11 0741) Temp Source: Oral (02/11 0741) BP: 105/50 (02/11 1200) Pulse Rate: 76 (02/11 1200)  Labs: Recent Labs    02/02/23 0325 02/02/23 2207 02/03/23 0457 02/03/23 1018 02/03/23 1836 02/04/23 0127 02/04/23 0525  HGB 8.8*  --  7.9* 8.7* 9.7* 7.2* 7.3*  HCT 25.6*  --  23.6* 26.2* 30.2* 21.4* 21.9*  PLT 106*  --  111* 143*  --  149*  --   LABPROT 21.6*  --  17.5*  --   --   --   --   INR 1.9*  --  1.5*  --   --   --   --   HEPARINUNFRC 0.15* 0.30 0.30  --   --  <0.10*  --   CREATININE 0.54  --  0.65  --   --  0.76  --      Estimated Creatinine Clearance: 83.5 mL/min (by C-G formula based on SCr of 0.76 mg/dL).   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 Patient reports being s/p IVC filter. Patient is presenting with leg pain and swelling. Patient is on warfarin prior to arrival but has stopped x 5 days for gum surgery. Per patient takes 86m daily prior to that. Heparin per pharmacy consult placed for DVT.   Pt is s/p tPA and thrombectomy. Heparin and coumadin restarted 2/6 post procedure. Plans are for lovenox when CBC is stable.  Heparin level undetectable this morning as expected, as heparin was held for ~4h on 2/10 due to RP hematoma on imaging, then resumed at lower rate 1200 units/hr, then stopped again early 2/11 AM. Noted VVS plans for holding heparin 2/11 with low hgb. Will follow up plans to resume 2/12.  Goal of Therapy:  Heparin level 0.3-0.7 units/ml Monitor platelets by anticoagulation protocol: Yes   Plan:  Holding heparin 2/11 per VVS  F/u [facility]VS plans to resume heparin on  2/12  CEliseo Gum PharmD PGY1 Pharmacy Resident   02/04/2023  12:13 PM

## 2023-02-04 NOTE — Plan of Care (Signed)

## 2023-02-04 NOTE — Progress Notes (Signed)
PROGRESS NOTE Shelby Conner  [id number] DOB: [birth year] DOA: 01/27/2023 PCP: Laverle Hobby, NP   Brief Narrative/Hospital Course: 52 y.o. female with medical history significant for b/l PE while 8 month pregnant in jan 2006,proximal DVT in 2010, hx of protein s and C deficiency maintained on woumadin, liver cirrhoses followed at [facility] transplant center, and DM2 who presented with right lower extremity edema that began 3 days PTA.  Her chronic Coumadin was held for dental procedure 2 and half week go and inr was as low as 1.4, she had done that in the past.  In the ED underwent further workup: CT abdomen pelvis revealed: hepatic cirrhosis with portal venous hypertension including multiple upper abdominal and esophageal varices as well as mild splenic enlargement.  Inferior vena caval filter is in place with probable venous thrombosis below this level and extending into the pelvic veins. Infiltration in the right iliopsoas region and soft tissues around the right hip likely indicate edema. Vascular surgery was consulted  She was admitted to ICU on heparin drip S/P pharmacomechanical thrombectomy with filter removal and ivc stent placement, coumadin resumed. She needed multiple prbc transfusions for ABLA.  Subjective:  Overnight patient has been upset unable to get pain medication due to her low blood pressure.   Received bolus this morning.  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  Currently on heparin held for 4 hours 2/10 and again this am due to patient's retroperitoneal hematoma/blood loss anemia.  Appreciate vascular surgery input continue plan of care RE: heparin infusion as per vascular surgery given patient's retroperitoneal hematoma /ABLA. Once stabilized then switch to lovenox - 1-2 months then transition to Eliquis as OP per Dr kale/hematology/Oncology.On asa 81 per vvs.  Input appreciated from Dr. Irene Limbo: he discussed various options with the patient, best option would be to continue heparin until her hemoglobin and platelets are stable and then transition to therapeutic dose of Lovenox twice daily for 1 to 2 months then outpatient transition to Eliquis-other alternatives:1) Lovenox bridging and going back on Coumadin and always using Lovenox bridging while holding Coumadin and restarting Coumadin after holding it, b)Lovenox bridging for shorter time like 2 weeks prior to transition to Eliquis.Per pharmacy Lovenox copay is $15/month, DOAC is $50.    Thrombocytopenia:2/65fliver cirrhosis, acute DVT, also on heparin.platelet counts improving continue to monitor.   Recent Labs  Lab 02/01/23 0450 02/02/23 0325 02/03/23 0457 02/03/23 1018 02/04/23 0127  PLT 85* 106* 111* 143* 149*    Retroperitoneal hematoma on right: Anemia of acute blood loss On admission hb 12 and hemoglobin decreased due to acute blood loss anemia/DIC w/ thrombolytics and procedure. S/p multiple PRBC transfusions.transfuse 1 unit PRBC today check 2-hour H&H posttransfusion.  Continue current pain control with hydrocodone/oxy morphine as blood pressure allows. Recent Labs    02/03/23 0457 02/03/23 1018 02/03/23  1836 02/04/23 0127 02/04/23 0525  HGB 7.9* 8.7* 9.7* 7.2* 7.3*  MCV 96.7 97.0  --  98.6  --      Hypomagnesemia:resolved Hypokalemia: Resolved- on kdur 40 daily.  T2DM with hyperglycemia: A1c 11.2, poorly controlled.she used to be on metformin 500 mg and injectable medication that she does not know the name.DM coordinator following.Started long-acting insulin- keep at 5u since cbg was 89 and hel overnight, cont ssi.  Insulin teaching prior to discharge Recent Labs  Lab 02/03/23 1134 02/03/23 1649 02/03/23 2014 02/03/23 2343 02/04/23 0619  GLUCAP 128* 106* 89 127* 116*    History of cirrhosis with portal venous hypertension with abdominal and esophageal varices/splenic enlargement:  Last viral hepatitis panel in 21 was negative. She reports she did follow-up with a liver transplant at [facility] and was declined transplant and states she is doing well, missed her last appointment ( q70monthy). last MELD in 15.  Advise outpatient follow-up with liver team   Hyponatremia improved, TSH slightly up recheck outpatient  Nonobstructive right nephrolithiasis incidentally noted  Cholelithiasis incidentally noted: Asymptomatic will need outpatient follow-up with PCP  DVT prophylaxis: Heparin infusion Code Status:   Code Status: Full Code Family Communication: plan of care discussed with patient at bedside. Patient status is: Inpatient because of acute DVT needing thrombolytics Level of care: Progressive   Dispo:The patient is from: home           Anticipated disposition:Home over the weekend once cleared by vascular and hematology  Objective: Vitals last 24 hrs: Vitals:   02/04/23 0741 02/04/23 0800 02/04/23 0900 02/04/23 1000  BP: 121/68 127/78 112/66 (!) 99/57  Pulse: 76 76 81 72  Resp: 17 20 14 19  $ Temp: 97.6 F (36.4 C)     TempSrc: Oral     SpO2: 95% 94% 96% 94%  Weight:      Height:       Weight change:  Physical Examination: General exam: AAOX3, anxious, tearful, HEENT:Oral mucosa moist, Ear/Nose WNL grossly, dentition normal. Respiratory system: bilaterally  clear  BS, no use of accessory muscle Cardiovascular system: S1 & S2 +, regular rate. Gastrointestinal system: Abdomen soft, NT,ND,BS+ Nervous System:Alert, awake, moving extremities and grossly nonfocal Extremities: LE ankle edema , lower extremities warm Skin: No rashes,no icterus. Bruise w/ rt flank and perineal area. MSK: Normal muscle bulk,tone, power   Medications reviewed:  Scheduled Meds:  sodium chloride   Intravenous Once   sodium chloride   Intravenous Once   sodium chloride   Intravenous Once   aspirin EC  81 mg Oral Daily   chlorhexidine  15 mL Mouth/Throat Once   Or   mouth rinse  15 mL Mouth Rinse Once   insulin aspart  0-15 Units Subcutaneous TID WC   insulin glargine-yfgn  5 Units Subcutaneous QHS   melatonin  3 mg Oral QHS   potassium chloride  40 mEq Oral Daily   Continuous Infusions:  sodium chloride 50 mL/hr at 02/04/23 0034   heparin Stopped (02/04/23 0322)   lactated ringers      Diet Order             Diet Carb Modified Fluid consistency: Thin; Room service appropriate? Yes  Diet effective now                  Intake/Output Summary (Last 24 hours) at 02/04/2023 1109 Last data filed at 02/04/2023 0300 Gross per 24 hour  Intake 1435.61 ml  Output --  Net 1435.61 ml  Net IO Since Admission: 3,651.32 mL [02/04/23 1109]  Wt Readings from Last 3 Encounters:  01/27/23 75.1 kg  07/24/22 72.6 kg  07/13/21 59 kg     Unresulted Labs (From admission, onward)     Start     Ordered   02/04/23 1051  Type and screen [place]  Once,   R       Comments: [place]    02/04/23 1050   02/04/23 1037  Prepare RBC (crossmatch)  (Blood Administration Adult)  Once,   R       Question Answer Comment  # of Units 1 unit   Transfusion Indications Other   Comments blood loss anemia   Number of Units to Keep Ahead NO units ahead   If emergent release call blood bank Not emergent release      02/04/23 1037   02/01/23 0500   Heparin level (unfractionated)  Daily,   R     Question:  Specimen collection method  Answer:  Unit=Unit collect   01/31/23 0017   02/01/23 [id number]  Basic metabolic panel  Daily,   R     Question:  Specimen collection method  Answer:  Unit=Unit collect   01/31/23 0745   01/29/23 0500  CBC  Daily,   R      01/28/23 2155          Data Reviewed: I have personally reviewed following labs and imaging studies CBC: Recent Labs  Lab 02/01/23 0450 02/01/23 1851 02/02/23 0325 02/03/23 0457 02/03/23 1018 02/03/23 1836 02/04/23 0127 02/04/23 0525  WBC 6.7  --  5.5 5.6 7.8  --  9.0  --   HGB 7.0*   < > 8.8* 7.9* 8.7* 9.7* 7.2* 7.3*  HCT 20.4*   < > 25.6* 23.6* 26.2* 30.2* 21.4* 21.9*  MCV 95.8  --  95.2 96.7 97.0  --  98.6  --   PLT 85*  --  106* 111* 143*  --  149*  --    < > = values in this interval not displayed.    Basic Metabolic Panel: Recent Labs  Lab 01/29/23 0101 01/30/23 0528 01/30/23 1842 01/31/23 0810 02/01/23 0450 02/01/23 1851 02/02/23 0325 02/03/23 0457 02/04/23 0127  NA 132* 135   < > 133* 139  --  135 137 133*  K 3.5 3.0*   < > 3.7 3.0* 3.7 3.6 3.5 4.2  CL 100 102   < > 104 107  --  103 104 101  CO2 24 21*  --  19* 23  --  23 25 24  $ GLUCOSE 241* 178*   < > 181* 105*  --  172* 139* 113*  BUN 12 5*   < > 6 5*  --  <5* <5* <5*  CREATININE 0.64 0.56   < > 0.62 0.52  --  0.54 0.65 0.76  CALCIUM 8.3* 7.8*  --  7.0* 7.4*  --  7.5* 7.7* 8.1*  MG 1.7 1.6*  --   --   --   --   --   --   --    < > = values in this interval not displayed.    Recent Results (from the past 240 hour(s))  Surgical pcr screen     Status: Abnormal   Collection Time: 01/29/23 10:08 AM   Specimen: Nasal Mucosa; Nasal Swab  Result Value Ref Range Status   MRSA, PCR NEGATIVE NEGATIVE Final   Staphylococcus aureus POSITIVE (A) NEGATIVE  Final    Comment: (NOTE) The Xpert SA Assay (FDA approved for NASAL specimens in patients 70 years of age and older), is one component of a  comprehensive surveillance program. It is not intended to diagnose infection nor to guide or monitor treatment. Performed at [facility], [place] [address], [place], [place] [address]     Antimicrobials: Anti-infectives (From admission, onward)    Start     Dose/Rate Route Frequency Ordered Stop   01/31/23 0400  ceFAZolin (ANCEF) IVPB 1 g/50 mL premix        1 g 100 mL/hr over 30 Minutes Intravenous Every 8 hours 01/30/23 2200 01/31/23 1223   01/29/23 2000  ceFAZolin (ANCEF) injection 1 g  Status:  Discontinued        1 g Intramuscular Every 8 hours 01/29/23 1205 01/29/23 1508   01/29/23 2000  ceFAZolin (ANCEF) IVPB 1 g/50 mL premix        1 g 100 mL/hr over 30 Minutes Intravenous Every 8 hours 01/29/23 1508 01/30/23 0452   01/29/23 1215  ceFAZolin (ANCEF) IVPB 2g/100 mL premix        2 g 200 mL/hr over 30 Minutes Intravenous  Once 01/29/23 1209 01/29/23 1225   01/29/23 1209  ceFAZolin (ANCEF) 2-4 GM/100ML-% IVPB       Note to Pharmacy: Alphonsus Sias: cabinet override      01/29/23 1209 01/29/23 1251     Radiology Studies: CT Angio Abd/Pel w/ and/or w/o  Result Date: 02/03/2023 CLINICAL DATA:  Status post catheter thrombectomy of the IVC and iliac veins, complicated IVC filter retrieval, IVC stent reconstruction, acute abdominal pain, suspect retroperitoneal hematoma. Patient remains on heparin IV. EXAM: CT ANGIOGRAPHY ABDOMEN AND PELVIS WITH CONTRAST AND WITHOUT CONTRAST TECHNIQUE: Multidetector CT imaging of the abdomen and pelvis was performed using the standard protocol during bolus administration of intravenous contrast. Multiplanar reconstructed images and MIPs were obtained and reviewed to evaluate the vascular anatomy. RADIATION DOSE REDUCTION: This exam was performed according to the departmental dose-optimization program which includes automated exposure control, adjustment of the mA and/or kV according to patient size and/or use of iterative reconstruction technique.  CONTRAST:  77m OMNIPAQUE IOHEXOL 350 MG/ML SOLN COMPARISON:  01/27/2023 FINDINGS: VASCULAR Aorta: Aortoiliac atherosclerosis without acute vascular process. No aneurysm or dissection. No occlusive disease. Celiac: Widely patent including its branches SMA: Widely patent including its branches Renals: Main renal arteries and accessory right lower pole renal artery are all patent. IMA: Occluded origin but quickly reconstituted via SMA collateral pathways. Inflow: Pelvic iliac arterial vasculature remain patent. Scattered nonocclusive atherosclerotic change. No inflow disease. Proximal Outflow: The common femoral, proximal profunda femoral, and proximal superficial femoral arteries demonstrated are all patent. Minor atherosclerotic change. Veins: Interval removal of the IVC filter in the infrarenal IVC with stent reconstruction of the IVC from the renal veins inferiorly to the IVC bifurcation. IVC stent is patent. Previous left common iliac vein stent has some intraluminal irregularities but appears grossly patent. The pelvic iliac veins also enhance and appear grossly patent. No recurrent significant occlusive thromboembolic process identified. Hepatic, portal, splenic, renal, mesenteric veins all appear patent. Lower esophageal varices noted as before related to portal hypertension and hepatic cirrhosis. Diffuse large right retroperitoneal mixed attenuation hematoma present, dominant component measures 7.5 x 9.6 x 10.9 cm. Within the hematoma, there is a trace amount of faint contrast extravasation, image 49/11 predominately present on the portal venous phase and delayed imaging more compatible with a venous bleed. The hematoma  does displace the right kidney anteriorly. No brisk hyperdense acute arterial bleed demonstrated. Review of the MIP images confirms the above findings. NON-VASCULAR Lower chest: Trace pleural effusions and dependent bibasilar atelectasis. Several radiodense ingested pills noted in the lower  esophagus. Stable heart size. No pericardial effusion. Metallic filter fragments noted in the left lower lung vasculature, image 1/11. Tiny right lower lobe peripheral segmental pulmonary emboli, image 14/5, expected finding after the complex venous intervention. Hepatobiliary: Mild hepatomegaly with nodular hepatic surface compatible with hepatic cirrhosis. No large focal hepatic abnormality. Recanalized paraumbilical vein noted as well as esophageal varices compatible with portal hypertension. Portal vein appears decompressed through the periumbilical to inferior epigastric collateral pathway. No biliary dilatation or obstruction pattern. Small calcified layering gallstones. Gallbladder nondistended. Common bile duct nondilated. Pancreas: Unremarkable. No pancreatic ductal dilatation or surrounding inflammatory changes. Spleen: No focal splenic abnormality. Mild splenic enlargement related to the hepatic disease. Accessory splenule noted. Adrenals/Urinary Tract: No adrenal abnormality. Strandy right retroperitoneal hemorrhage about the right adrenal gland and right kidney also noted. Nonobstructing subcentimeter right nephrolithiasis present in the upper pole. No hydronephrosis, obstructive uropathy, hydroureter or ureteral calculus appreciated. There are a few scattered subcentimeter hypodense renal cysts noted. No further imaging recommended. Bladder unremarkable. Stomach/Bowel: Negative for bowel obstruction, significant dilatation, ileus, or free air. Normal appearing appendix. Trace retroperitoneal edema and hemorrhage about the third portion the duodenum again related to the complex IVC vascular intervention. Lymphatic: No bulky adenopathy. Reproductive: Probable small left fundal uterine fibroid. No adnexal abnormality. Trace pelvic free fluid as well as presacral retroperitoneal strandy edema, again related to the complex venous intervention. Other: Intact abdominal wall. No ventral hernia. Diffuse  subcutaneous body edema compatible with anasarca. Musculoskeletal: No acute or significant osseous findings. IMPRESSION: 1. Large right retroperitoneal hematoma with a trace amount of central contrast extravasation predominately present on the portal venous phase and delayed imaging more compatible with a venous bleed. 2. Interval removal of the IVC filter with stent reconstruction of the IVC. IVC stent is patent. Previous left common iliac vein stent has some intraluminal irregularities but appears grossly patent. 3. No other acute intra-abdominal or pelvic vascular finding. 4. Tiny incidental right lower lobe peripheral segmental pulmonary emboli, expected finding after the complex venous intervention. 1. Non-vascular: 2. Hepatic cirrhosis with evidence of portal hypertension and esophageal varices. 3. Trace pleural effusions and bibasilar atelectasis. 4. Cholelithiasis. 5. Nonobstructing right nephrolithiasis. 6. Diffuse subcutaneous body edema compatible with anasarca. 7. Aortic atherosclerosis. Aortic Atherosclerosis (ICD10-I70.0). These results were called by telephone at the time of interpretation on 02/03/2023 at 12:00 pm to provider Dr. Trula Slade, who verbally acknowledged these results. Electronically Signed   By: Jerilynn Mages.  Shick M.D.   On: 02/03/2023 12:16     LOS: 7 days   Antonieta Pert, MD [facility]  02/04/2023, 11:09 AM

## 2023-02-04 NOTE — Progress Notes (Signed)
Paged on call Md Dr. Claria Dice, about pt's Bp trending down, 99/53,95/56, 92/64. MD called back, also notified that pt has been very upset, crying and saying " nobody cares about me, and they are just letting me die, I am sitting here and bleeding. " Also notified pt complained of light headedness when getting up and tiredness. Notified that haven't given her pain medication because of her BP,  pt is very anxious. Received order for NS bolus and stat H and H. Plan of care continues.

## 2023-02-04 NOTE — Progress Notes (Addendum)
Progress Note    02/04/2023 7:01 AM 5 Days Post-Op  Subjective:  very upset this morning and more pain.  Says her abdomen hurts and was told her kidney moved forward.  Says that she is bleeding and no one cares.  Upset that heparin was restarted yesterday.  Upset that she could not get anymore pain med due to her soft blood pressure.   Afebrile HR 60's-90's NSR [id number] systolic overnight [id number] RA  Vitals:   02/04/23 0530 02/04/23 0600  BP: (!) 91/48 105/62  Pulse: 65 70  Resp: 19 16  Temp:    SpO2: 93% 93%    Physical Exam: General:  resting in bed Cardiac:  regular Lungs:  non labored Incision:  bolster in place right neck Extremities:  palpable DP pulses bilaterally   CBC    Component Value Date/Time   WBC 9.0 02/04/2023 0127   RBC 2.17 (L) 02/04/2023 0127   HGB 7.3 (L) 02/04/2023 0525   HCT 21.9 (L) 02/04/2023 0525   PLT 149 (L) 02/04/2023 0127   MCV 98.6 02/04/2023 0127   MCH 33.2 02/04/2023 0127   MCHC 33.6 02/04/2023 0127   RDW 17.1 (H) 02/04/2023 0127   LYMPHSABS 2.6 01/27/2023 1447   MONOABS 0.6 01/27/2023 1447   EOSABS 0.2 01/27/2023 1447   BASOSABS 0.1 01/27/2023 1447    BMET    Component Value Date/Time   NA 133 (L) 02/04/2023 0127   K 4.2 02/04/2023 0127   CL 101 02/04/2023 0127   CO2 24 02/04/2023 0127   GLUCOSE 113 (H) 02/04/2023 0127   BUN <5 (L) 02/04/2023 0127   CREATININE 0.76 02/04/2023 0127   CALCIUM 8.1 (L) 02/04/2023 0127   GFRNONAA >60 02/04/2023 0127   GFRAA  10/22/2009 0450    >60        The eGFR has been calculated using the MDRD equation. This calculation has not been validated in all clinical situations. eGFR's persistently <60 mL/min signify possible Chronic Kidney Disease.    INR    Component Value Date/Time   INR 1.5 (H) 02/03/2023 0457     Intake/Output Summary (Last 24 hours) at 02/04/2023 0701 Last data filed at 02/04/2023 0300 Gross per 24 hour  Intake 1435.61 ml  Output --  Net 1435.61 ml     CTA a/p 02/03/2023: IMPRESSION: 1. Large right retroperitoneal hematoma with a trace amount of central contrast extravasation predominately present on the portal venous phase and delayed imaging more compatible with a venous bleed. 2. Interval removal of the IVC filter with stent reconstruction of the IVC. IVC stent is patent. Previous left common iliac vein stent has some intraluminal irregularities but appears grossly patent. 3. No other acute intra-abdominal or pelvic vascular finding. 4. Tiny incidental right lower lobe peripheral segmental pulmonary emboli, expected finding after the complex venous intervention.   1. Non-vascular: 2. Hepatic cirrhosis with evidence of portal hypertension and esophageal varices. 3. Trace pleural effusions and bibasilar atelectasis. 4. Cholelithiasis. 5. Nonobstructing right nephrolithiasis. 6. Diffuse subcutaneous body edema compatible with anasarca. 7. Aortic atherosclerosis.  Assessment/Plan:  52 y.o. female is s/p:  percutaneous pharmacomechanical thrombectomy with filter removal. IVC stent placement.   5 Days Post-Op   -pt with right flank pain yesterday-CTA reveals RP hematoma with trace central contrast extravasation.  Heparin was held for 4 hours yesterday and then restarted due to concern for rethrombosis.   -Continues to have increased pain.   Pain medication not given earlier due to soft BP-this has improved and  she will receive oral pain meds.  Received morphine 62m at 0615.   -acute blood loss anemia with hgb 7.2 this morning.  Repeat H&H this morning reveals hgb 7.3.  Heparin currently being held.  Will discuss with Dr. BTrula Sladewhen to restart heparin and he will be by to see pt this morning.  Transfusion per primary team.  -bolster on right neck still in place-RN will remove this morning.    SLeontine Locket PA-C [facility] [phone]/10/2023 7:01 AM  I agree with the above.  I have seen and  evaluated the patient.  I had a lengthy conversation with her regarding her treatment options.  She was doing well until yesterday morning at which time she developed severe right flank pain.  A CT scan showed a large retroperitoneal hematoma with active extravasation.  I stopped her heparin for 4 hours and then restarted at a lower rate.  This morning her hemoglobin has dropped again and so her heparin has been discontinued.  I will keep this off for 24 hours.  She had questions regarding why her heparin was restarted.  I told her that with her hypercoagulable state that the risk of the thrombosis was the reason why we start her heparin.  Since her hemoglobin is dropped again today I have stopped her heparin again.  She is now understanding of the treatment algorithm.  We will plan on restarting her heparin tomorrow morning.  WAnnamarie Major

## 2023-02-04 NOTE — Progress Notes (Addendum)
0315: Pt's Hemoglobin drooped to 7.2 this am from 9.7 at 1836 last evening. Bp 100/64, 02 sat 95% in RA, HR 75. Patient does not show any active sign of bleeding.  Dr. Trula Slade called and notified. Reviewed labs with MD. Notified patient's Bp has been soft,  SBP 100's. Received Order to hold heparin till am.  Heparin was held at 0322, pharmacy made aware. Pt is very upset and crying, stated " she didn't want to restart heparin in first place." Reassured patient, listened to her and answered her questions and concerns. Plan of care continues.

## 2023-02-04 NOTE — Progress Notes (Signed)
Notified on call triad MD DR. Crosley that pt's hemoglobin dropped to 7.2 from 9.7 last night and heparin gtts has been stopped per Dr. Trula Slade order. Plan of care continues.

## 2023-02-05 DIAGNOSIS — I82491 Acute embolism and thrombosis of other specified deep vein of right lower extremity: Secondary | ICD-10-CM | POA: Diagnosis not present

## 2023-02-05 LAB — BPAM RBC
Blood Product Expiration Date: 202403122359
ISSUE DATE / TIME: 202402111320
Unit Type and Rh: 5100

## 2023-02-05 LAB — CBC
HCT: 27.5 % — ABNORMAL LOW (ref 36.0–46.0)
HCT: 26.8 % — ABNORMAL LOW (ref 36.0–46.0)
Hemoglobin: 9.1 g/dL — ABNORMAL LOW (ref 12.0–15.0)
Hemoglobin: 8.6 g/dL — ABNORMAL LOW (ref 12.0–15.0)
MCH: 31.5 pg (ref 26.0–34.0)
MCH: 31.5 pg (ref 26.0–34.0)
MCHC: 33.1 g/dL (ref 30.0–36.0)
MCHC: 32.1 g/dL (ref 30.0–36.0)
MCV: 95.2 fL (ref 80.0–100.0)
MCV: 98.2 fL (ref 80.0–100.0)
Platelets: 164 10*3/uL (ref 150–400)
Platelets: 187 10*3/uL (ref 150–400)
RBC: 2.89 MIL/uL — ABNORMAL LOW (ref 3.87–5.11)
RBC: 2.73 MIL/uL — ABNORMAL LOW (ref 3.87–5.11)
RDW: 18.9 % — ABNORMAL HIGH (ref 11.5–15.5)
RDW: 18.3 % — ABNORMAL HIGH (ref 11.5–15.5)
WBC: 12.1 10*3/uL — ABNORMAL HIGH (ref 4.0–10.5)
WBC: 11 10*3/uL — ABNORMAL HIGH (ref 4.0–10.5)
nRBC: 0.6 % — ABNORMAL HIGH (ref 0.0–0.2)
nRBC: 0.5 % — ABNORMAL HIGH (ref 0.0–0.2)

## 2023-02-05 LAB — HEPARIN LEVEL (UNFRACTIONATED)
Heparin Unfractionated: <0.1 IU/mL — ABNORMAL LOW (ref 0.30–0.70)
Heparin Unfractionated: 0.18 IU/mL — ABNORMAL LOW (ref 0.30–0.70)
Heparin Unfractionated: 0.29 IU/mL — ABNORMAL LOW (ref 0.30–0.70)

## 2023-02-05 LAB — HEMOGLOBIN AND HEMATOCRIT, BLOOD
HCT: 27.5 % — ABNORMAL LOW (ref 36.0–46.0)
Hemoglobin: 9 g/dL — ABNORMAL LOW (ref 12.0–15.0)

## 2023-02-05 LAB — TYPE AND SCREEN
ABO/RH(D): O POS
Antibody Screen: NEGATIVE
Unit division: 0

## 2023-02-05 LAB — BASIC METABOLIC PANEL
Anion gap: 12 (ref 5–15)
Anion gap: 9 (ref 5–15)
BUN: <5 mg/dL — ABNORMAL LOW (ref 6–20)
BUN: 6 mg/dL (ref 6–20)
CO2: 21 mmol/L — ABNORMAL LOW (ref 22–32)
CO2: 27 mmol/L (ref 22–32)
Calcium: 8.7 mg/dL — ABNORMAL LOW (ref 8.9–10.3)
Calcium: 9 mg/dL (ref 8.9–10.3)
Chloride: 101 mmol/L (ref 98–111)
Chloride: 97 mmol/L — ABNORMAL LOW (ref 98–111)
Creatinine, Ser: 0.73 mg/dL (ref 0.44–1.00)
Creatinine, Ser: 0.93 mg/dL (ref 0.44–1.00)
GFR, Estimated: >60 mL/min (ref >60)
GFR, Estimated: >60 mL/min (ref >60)
Glucose, Bld: 100 mg/dL — ABNORMAL HIGH (ref 70–99)
Glucose, Bld: 130 mg/dL — ABNORMAL HIGH (ref 70–99)
Potassium: 4.4 mmol/L (ref 3.5–5.1)
Potassium: 4.4 mmol/L (ref 3.5–5.1)
Sodium: 134 mmol/L — ABNORMAL LOW (ref 135–145)
Sodium: 133 mmol/L — ABNORMAL LOW (ref 135–145)

## 2023-02-05 LAB — GLUCOSE, CAPILLARY
Glucose-Capillary: 121 mg/dL — ABNORMAL HIGH (ref 70–99)
Glucose-Capillary: 110 mg/dL — ABNORMAL HIGH (ref 70–99)
Glucose-Capillary: 138 mg/dL — ABNORMAL HIGH (ref 70–99)
Glucose-Capillary: 143 mg/dL — ABNORMAL HIGH (ref 70–99)

## 2023-02-05 MED ORDER — HEPARIN (PORCINE) 25000 UT/250ML-% IV SOLN
1800.0000 [IU]/h | INTRAVENOUS | Status: AC
  Administered 2023-02-05: 1400 [IU]/h via INTRAVENOUS
  Administered 2023-02-06 – 2023-02-07 (×2): 1700 [IU]/h via INTRAVENOUS
  Filled 2023-02-05 (×4): qty 250

## 2023-02-05 MED ORDER — POTASSIUM CHLORIDE CRYS ER 20 MEQ PO TBCR
20.0000 meq | EXTENDED_RELEASE_TABLET | Freq: Every day | ORAL | Status: DC
  Administered 2023-02-05 – 2023-02-08 (×4): 20 meq via ORAL
  Filled 2023-02-05 (×3): qty 1

## 2023-02-05 NOTE — Progress Notes (Signed)
ANTICOAGULATION CONSULT NOTE  Pharmacy Consult for Heparin Indication: DVT, protein S and C deficiency   No Known Allergies  Patient Measurements: Height: 5' 4"$  (162.6 cm) Weight: 75.1 kg (165 lb 9.1 oz) IBW/kg (Calculated) : 54.7 Heparin Dosing Weight: 70 kg  Vital Signs: Temp: 97.8 F (36.6 C) (02/12 0312) Temp Source: Oral (02/12 0312) BP: 115/58 (02/12 0312) Pulse Rate: 68 (02/12 0312)  Labs: Recent Labs    02/03/23 0457 02/03/23 1018 02/03/23 1836 02/04/23 0127 02/04/23 0525 02/04/23 1934 02/05/23 0141  HGB 7.9* 8.7*   < > 7.2* 7.3* 8.6* 9.1*  HCT 23.6* 26.2*   < > 21.4* 21.9* 26.0* 27.5*  PLT 111* 143*  --  149*  --   --  164  LABPROT 17.5*  --   --   --   --   --   --   INR 1.5*  --   --   --   --   --   --   HEPARINUNFRC 0.30  --   --  <0.10*  --   --  <0.10*  CREATININE 0.65  --   --  0.76  --   --  0.73   < > = values in this interval not displayed.    Estimated Creatinine Clearance: 83.5 mL/min (by C-G formula based on SCr of 0.73 mg/dL).   Assessment: 89 YOF with a medical history significant for Protein C and S deficiency on warfarin PTA s/p IVC filter. Patient was on warfarin PTA but had stopped x 5 days prior for pending gum surgery. Now with new leg pain and swelling found to have extensive bilateral DVTs. Pharmacy consulted to manage heparin.  Per patient, warfarin regimen is 52m daily. Pt is s/p tPA and thrombectomy. Heparin and coumadin restarted 2/06 post procedure. Warfarin and heparin infusion held due to hematoma 2/10. Now resuming at lower rate 2/12 AM.    Goal of Therapy:  Heparin level 0.3-0.7 units/ml (targeting lower end of goal) Monitor platelets by anticoagulation protocol: Yes   Plan:  Start heparin infusion at 1500 units/hr Check heparin level in 6 hours and daily while on heparin Continue to monitor H&H and platelets   Thank you for allowing pharmacy to be a part of this patient's care.  AArdyth Harps PharmD Clinical  Pharmacist

## 2023-02-05 NOTE — TOC Transition Note (Signed)
Discharge medications (1) have been retrieved from [facility] weekend lockup and are now being stored in the Transitions of Care [facility] Pharmacy on the second floor until patient is ready for discharge.

## 2023-02-05 NOTE — Progress Notes (Addendum)
[facility] [place]  Subjective  - confused as to why the Heparin was stopped yesterday.   Objective (!) 115/58 68 97.8 F (36.6 C) (Oral) 18 91%  Intake/Output Summary (Last 24 hours) at 02/05/2023 0720 Last data filed at 02/04/2023 1702 Gross per 24 hour  Intake 418 ml  Output --  Net 418 ml    Palpable AT B LE R LQ tenderness to palpation, soft  Heart RRR Lungs non labored breathing  Assessment/Planning:  52 y.o. female is s/p:  percutaneous pharmacomechanical thrombectomy with filter removal. IVC stent placement.   6 Days Post-Op   Will restart Heparin, get H/H around 12 noon. Heparin was held due to HGB drop  Plan for DOAC at discharge  Roxy Horseman 02/05/2023 7:20 AM --  VASCULAR STAFF ADDENDUM: I have independently interviewed and examined the patient. I agree with the above.  Will trend Hct today.  Plan for transition to [facility] tomorrow pending no Hct changes.  OOB with PT/OT/nursing.  Regular diet.   Plan to ensure she can tolerate DOAC tomorrow with possible home Wednesday   Cassandria Santee, MD [facility] Phone Number: [phone] 02/05/2023 1:06 PM    Laboratory Lab Results: Recent Labs    02/04/23 0127 02/04/23 0525 02/04/23 1934 02/05/23 0141  WBC 9.0  --   --  12.1*  HGB 7.2*   < > 8.6* 9.1*  HCT 21.4*   < > 26.0* 27.5*  PLT 149*  --   --  164   < > = values in this interval not displayed.   BMET Recent Labs    02/04/23 0127 02/05/23 0141  NA 133* 134*  K 4.2 4.4  CL 101 101  CO2 24 21*  GLUCOSE 113* 100*  BUN <5* <5*  CREATININE 0.76 0.73  CALCIUM 8.1* 8.7*    COAG Lab Results  Component Value Date   INR 1.5 (H) 02/03/2023   INR 1.9 (H) 02/02/2023   INR 2.7 (H) 02/01/2023   No results found for: "PTT"

## 2023-02-05 NOTE — Progress Notes (Signed)
ANTICOAGULATION CONSULT NOTE  Pharmacy Consult for Heparin Indication: DVT, protein S and C deficiency   No Known Allergies  Patient Measurements: Height: 5' 4"$  (162.6 cm) Weight: 75.1 kg (165 lb 9.1 oz) IBW/kg (Calculated) : 54.7 Heparin Dosing Weight: 70 kg  Vital Signs: Temp: 98.1 F (36.7 C) (02/12 0858) Temp Source: Oral (02/12 0858) BP: 108/61 (02/12 0858)  Labs: Recent Labs    02/03/23 0457 02/03/23 1018 02/03/23 1836 02/04/23 0127 02/04/23 0525 02/04/23 1934 02/05/23 0141 02/05/23 1209 02/05/23 1544  HGB 7.9* 8.7*   < > 7.2*   < > 8.6* 9.1* 9.0*  --   HCT 23.6* 26.2*   < > 21.4*   < > 26.0* 27.5* 27.5*  --   PLT 111* 143*  --  149*  --   --  164  --   --   LABPROT 17.5*  --   --   --   --   --   --   --   --   INR 1.5*  --   --   --   --   --   --   --   --   HEPARINUNFRC 0.30  --   --  <0.10*  --   --  <0.10*  --  0.18*  CREATININE 0.65  --   --  0.76  --   --  0.73  --   --    < > = values in this interval not displayed.     Estimated Creatinine Clearance: 83.5 mL/min (by C-G formula based on SCr of 0.73 mg/dL).   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 Patient's warfarin was stopped x 5 days prior for pending gum surgery. Now with new leg pain and swelling, found to have extensive bilateral DVTs. Pharmacy consulted to manage heparin.  Per patient, warfarin regimen is 63m daily. Pt is s/p tPA and thrombectomy. Heparin and coumadin restarted 2/06 post procedure. Warfarin and heparin infusion held due to hematoma 2/10. Now resuming at lower rate 2/12 AM.   Heparin level is sub-therapeutic at 0.18 units/hr.  No issue with heparin infusion and hematoma is stable per discussion with RN.  Goal of Therapy:  Heparin level 0.3-0.7 units/ml (targeting lower end of goal) Monitor platelets by anticoagulation protocol: Yes   Plan:  Increase heparin infusion to 1650 units/hr Check  heparin level in 6 hours  Monitor for hematoma resolution  Prudie Guthridge D. DMina Marble PharmD, BCPS, BWardell2/11/2023, 4:50 PM

## 2023-02-05 NOTE — Progress Notes (Signed)
PROGRESS NOTE Shelby Conner  [id number] DOB: 26-Apr-1971 DOA: 01/27/2023 PCP: Laverle Hobby, NP   Brief Narrative/Hospital Course: 52 y.o. female with medical history significant for b/l PE while 8 month pregnant in jan 2006,proximal DVT in 2010, hx of protein s and C deficiency maintained on woumadin, liver cirrhoses followed at [facility] transplant center, and DM2 who presented with right lower extremity edema that began 3 days PTA.  Her chronic Coumadin was held for dental procedure 2 and half week go and inr was as low as 1.4, she had done that in the past.  In the ED underwent further workup: CT abdomen pelvis revealed: hepatic cirrhosis with portal venous hypertension including multiple upper abdominal and esophageal varices as well as mild splenic enlargement.  Inferior vena caval filter is in place with probable venous thrombosis below this level and extending into the pelvic veins. Infiltration in the right iliopsoas region and soft tissues around the right hip likely indicate edema. Vascular surgery was consulted  She was admitted to ICU on heparin drip S/P pharmacomechanical thrombectomy with filter removal and ivc stent placement, coumadin resumed. She needed multiple prbc transfusions for ABLA. Patient was complaining of severe right hip pain on 2/10 and CT scan revealed-large right retroperitoneal hematoma> her heparin was initially held for 4 hours then continued but overnight 2/10-11 continues to have drop in hemoglobin and worsening pain.  Vascular again held heparin, will need PRBC ordered, heparin resumed 2/12 am  Subjective: Patient seen and examined this am. Resting comfortably She was waiting for heparin to be restarted. 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  Heparin held 2/11, resumed 2/12 a.m. recheck H&H this afternoon stable. Appreciate  vascular surgery input. Once stabilized then switch to lovenox - 1-2 months then transition to Eliquis as OP per Dr kale/hematology/Oncology.On asa 81 per vvs.  Input appreciated from Dr. Irene Limbo: he discussed various options with the patient, best option would be to continue heparin until her hemoglobin and platelets are stable and then transition to therapeutic dose of Lovenox twice daily for 1 to 2 months then outpatient transition to Eliquis-other alternatives:1) Lovenox bridging and going back on Coumadin and always using Lovenox bridging while holding Coumadin and restarting Coumadin after holding it, b)Lovenox bridging for shorter time like 2 weeks prior to transition to Eliquis.Per pharmacy Lovenox copay is $15/month, DOAC is $50.    Thrombocytopenia:2/2 from liver cirrhosis, acute DVT, also DIC 2/2 lytics- resolved Recent Labs  Lab 02/02/23 0325 02/03/23 0457 02/03/23 1018 02/04/23 0127 02/05/23 0141  PLT 106* 111* 143* 149* 164   Retroperitoneal hematoma on right 2/10: Anemia of acute blood loss Patient has been needing  multiple blood transfusion.  Heparin was held in the light of ongoing pain and bleeding per vascular.  Was very upset why  heparin was restarted on 2/10.  Multifactorial w/ blood loss anemia/DIC w/ thrombolytics and procedure continue current pain control with hydrocodone/oxy morphine as blood pressure allows.  Continue to monitor H&H Recent Labs    02/04/23 0127 02/04/23 0525 02/04/23 1934 02/05/23 0141 02/05/23 1209  HGB 7.2* 7.3* 8.6* 9.1* 9.0*  MCV 98.6  --   --  95.2  --    Hypomagnesemia:resolved Hypokalemia: Resolved- on kdur 40 daily> potassium has been >4, decreased to 20 daily.  T2DM with hyperglycemia: A1c 11.2, poorly controlled.she used to be on metformin 500 mg and injectable medication that she does not know the name.DM coordinator following.Started long-acting insulin- keep at 5u since cbg- cbg is stable now cont ssi.  Insulin teaching prior to discharge Recent Labs  Lab 02/04/23 1119 02/04/23 1637 02/04/23 2040 02/05/23 0602 02/05/23 1137  GLUCAP 142* 110* 127* 121* 110*   History of cirrhosis with portal venous hypertension with abdominal and esophageal varices/splenic enlargement:  Last viral hepatitis panel in 21 was negative. She reports she did follow-up with a liver transplant at [facility] and was declined transplant and states she is doing well, missed her last appointment ( q44monthy). last MELD in 15.  Advise outpatient follow-up with liver team   Hyponatremia improved, TSH slightly up recheck outpatient.  Nonobstructive right nephrolithiasis incidentally noted  Cholelithiasis incidentally noted: Asymptomatic will need outpatient follow-up with PCP  DVT prophylaxis: Heparin infusion Code Status:   Code Status: Full Code Family Communication: plan of care discussed with patient at bedside. Patient status is: Inpatient because of acute DVT needing thrombolytics Level of care: Progressive   Dispo:The patient is from: home           Anticipated disposition:Home over the weekend once cleared by vascular and hematology  Objective: Vitals last 24 hrs: Vitals:   02/05/23 0000  02/05/23 0200 02/05/23 0312 02/05/23 0858  BP: (!) 87/55 106/71 (!) 115/58 108/61  Pulse: 70 64 68   Resp: 17 20 18   $ Temp:   97.8 F (36.6 C) 98.1 F (36.7 C)  TempSrc:   Oral Oral  SpO2: 90% 91% 91%   Weight:      Height:       Weight change:  Physical Examination: General exam: AAox3, weak,older appearing HEENT:Oral mucosa moist, Ear/Nose WNL grossly, dentition normal. Respiratory system: bilaterally clear BS,no use of accessory muscle Cardiovascular system: S1 & S2 +, regular rate. Gastrointestinal system: Abdomen soft, NT,ND,BS+ Nervous System:Alert, awake, moving extremities and grossly nonfocal Extremities: LE ankle edema neg, lower extremities warm. Skin: No rashes,no icterus. Bruise on rt flank and perineum. MSK: Normal muscle bulk,tone, power   Medications reviewed:  Scheduled Meds:  sodium chloride   Intravenous Once   sodium chloride   Intravenous Once   aspirin EC  81 mg Oral Daily   chlorhexidine  15 mL Mouth/Throat Once   Or   mouth rinse  15 mL Mouth Rinse Once   insulin aspart  0-15 Units Subcutaneous TID WC   insulin glargine-yfgn  5 Units Subcutaneous QHS   melatonin  3 mg Oral QHS   potassium chloride  20 mEq Oral Daily   Continuous Infusions:  sodium chloride 50 mL/hr at 02/04/23 0034   heparin 1,400 Units/hr (02/05/23 0923)   lactated ringers      Diet Order  Diet Carb Modified Fluid consistency: Thin; Room service appropriate? Yes  Diet effective now                  Intake/Output Summary (Last 24 hours) at 02/05/2023 1409 Last data filed at 02/04/2023 1702 Gross per 24 hour  Intake 418 ml  Output --  Net 418 ml   Net IO Since Admission: 4,069.32 mL [02/05/23 1409]  Wt Readings from Last 3 Encounters:  01/27/23 75.1 kg  07/24/22 72.6 kg  07/13/21 59 kg     Unresulted Labs (From admission, onward)     Start     Ordered   02/06/23 0500  Heparin level (unfractionated)  Daily,   R     Question:  Specimen collection  method  Answer:  Lab=Lab collect   02/05/23 0802   02/06/23 [id number]  Basic metabolic panel  Tomorrow morning,   R       Question:  Specimen collection method  Answer:  Lab=Lab collect   02/05/23 0826   02/05/23 1530  Heparin level (unfractionated)  Once-Timed,   TIMED       Question:  Specimen collection method  Answer:  Lab=Lab collect   02/05/23 0929   02/01/23 0500  Heparin level (unfractionated)  Daily,   R     Question:  Specimen collection method  Answer:  Unit=Unit collect   01/31/23 0017   01/29/23 0500  CBC  Daily,   R      01/28/23 2155          Data Reviewed: I have personally reviewed following labs and imaging studies CBC: Recent Labs  Lab 02/02/23 0325 02/03/23 0457 02/03/23 1018 02/03/23 1836 02/04/23 0127 02/04/23 0525 02/04/23 1934 02/05/23 0141 02/05/23 1209  WBC 5.5 5.6 7.8  --  9.0  --   --  12.1*  --   HGB 8.8* 7.9* 8.7*   < > 7.2* 7.3* 8.6* 9.1* 9.0*  HCT 25.6* 23.6* 26.2*   < > 21.4* 21.9* 26.0* 27.5* 27.5*  MCV 95.2 96.7 97.0  --  98.6  --   --  95.2  --   PLT 106* 111* 143*  --  149*  --   --  164  --    < > = values in this interval not displayed.   Basic Metabolic Panel: Recent Labs  Lab 01/30/23 0528 01/30/23 1842 02/01/23 0450 02/01/23 1851 02/02/23 0325 02/03/23 0457 02/04/23 0127 02/05/23 0141  NA 135   < > 139  --  135 137 133* 134*  K 3.0*   < > 3.0* 3.7 3.6 3.5 4.2 4.4  CL 102   < > 107  --  103 104 101 101  CO2 21*   < > 23  --  23 25 24 $ 21*  GLUCOSE 178*   < > 105*  --  172* 139* 113* 100*  BUN 5*   < > 5*  --  <5* <5* <5* <5*  CREATININE 0.56   < > 0.52  --  0.54 0.65 0.76 0.73  CALCIUM 7.8*   < > 7.4*  --  7.5* 7.7* 8.1* 8.7*  MG 1.6*  --   --   --   --   --   --   --    < > = values in this interval not displayed.   Recent Results (from the past 240 hour(s))  Surgical pcr screen     Status: Abnormal   Collection Time: 01/29/23 10:08 AM  Specimen: Nasal Mucosa; Nasal Swab  Result Value Ref Range Status   MRSA, PCR  NEGATIVE NEGATIVE Final   Staphylococcus aureus POSITIVE (A) NEGATIVE Final    Comment: (NOTE) The Xpert SA Assay (FDA approved for NASAL specimens in patients 16 years of age and older), is one component of a comprehensive surveillance program. It is not intended to diagnose infection nor to guide or monitor treatment. Performed at [facility], [place] [address], [place], [place] [address]     Antimicrobials: Anti-infectives (From admission, onward)    Start     Dose/Rate Route Frequency Ordered Stop   01/31/23 0400  ceFAZolin (ANCEF) IVPB 1 g/50 mL premix        1 g 100 mL/hr over 30 Minutes Intravenous Every 8 hours 01/30/23 2200 01/31/23 1223   01/29/23 2000  ceFAZolin (ANCEF) injection 1 g  Status:  Discontinued        1 g Intramuscular Every 8 hours 01/29/23 1205 01/29/23 1508   01/29/23 2000  ceFAZolin (ANCEF) IVPB 1 g/50 mL premix        1 g 100 mL/hr over 30 Minutes Intravenous Every 8 hours 01/29/23 1508 01/30/23 0452   01/29/23 1215  ceFAZolin (ANCEF) IVPB 2g/100 mL premix        2 g 200 mL/hr over 30 Minutes Intravenous  Once 01/29/23 1209 01/29/23 1225   01/29/23 1209  ceFAZolin (ANCEF) 2-4 GM/100ML-% IVPB       Note to Pharmacy: Alphonsus Sias: cabinet override      01/29/23 1209 01/29/23 1251     Radiology Studies: No results found.   LOS: 8 days   Antonieta Pert, MD [facility]  02/05/2023, 2:09 PM

## 2023-02-05 NOTE — Progress Notes (Signed)
ANTICOAGULATION CONSULT NOTE  Pharmacy Consult for Heparin Indication: DVT, protein S and C deficiency   No Known Allergies  Patient Measurements: Height: 5' 4"$  (162.6 cm) Weight: 75.1 kg (165 lb 9.1 oz) IBW/kg (Calculated) : 54.7 Heparin Dosing Weight: 70 kg  Vital Signs: Temp: 97.7 F (36.5 C) (02/12 2335) Temp Source: Oral (02/12 2335) BP: 129/68 (02/12 2335) Pulse Rate: 98 (02/12 2335)  Labs: Recent Labs    02/03/23 0457 02/03/23 1018 02/03/23 1836 02/04/23 0127 02/04/23 0525 02/04/23 1934 02/05/23 0141 02/05/23 1209 02/05/23 1544 02/05/23 2314  HGB 7.9* 8.7*   < > 7.2*   < > 8.6* 9.1* 9.0*  --   --   HCT 23.6* 26.2*   < > 21.4*   < > 26.0* 27.5* 27.5*  --   --   PLT 111* 143*  --  149*  --   --  164  --   --   --   LABPROT 17.5*  --   --   --   --   --   --   --   --   --   INR 1.5*  --   --   --   --   --   --   --   --   --   HEPARINUNFRC 0.30  --   --  <0.10*  --   --  <0.10*  --  0.18* 0.29*  CREATININE 0.65  --   --  0.76  --   --  0.73  --   --  0.93   < > = values in this interval not displayed.     Estimated Creatinine Clearance: 71.9 mL/min (by C-G formula based on SCr of 0.93 mg/dL).   Assessment: 76 YOF with a medical history significant for Protein C and S deficiency on warfarin PTA s/p IVC filter. Patient's warfarin was stopped x 5 days prior for pending gum surgery. Now with new leg pain and swelling, found to have extensive bilateral DVTs. Pharmacy consulted to manage heparin.  Per patient, warfarin regimen is 55m daily. Pt is s/p tPA and thrombectomy. Heparin and coumadin restarted 2/06 post procedure. Warfarin and heparin infusion held due to hematoma 2/10. Now resuming at lower rate 2/12 AM.   Heparin level is sub-therapeutic at 0.18 units/hr.  No issue with heparin infusion and hematoma is stable per discussion with RN.  2/12 PM update:  Heparin level just below goal  Goal of Therapy:  Heparin level 0.3-0.5 units/mL Monitor platelets  by anticoagulation protocol: Yes   Plan:  Increase heparin infusion slightly to 1700 units/hr Check heparin level with AM labs  Monitor for hematoma resolution  JNarda Bonds PharmD, BCPS Clinical Pharmacist Phone: [phone]

## 2023-02-06 LAB — CBC
HCT: 26.1 % — ABNORMAL LOW (ref 36.0–46.0)
Hemoglobin: 8.3 g/dL — ABNORMAL LOW (ref 12.0–15.0)
MCH: 31.3 pg (ref 26.0–34.0)
MCHC: 31.8 g/dL (ref 30.0–36.0)
MCV: 98.5 fL (ref 80.0–100.0)
Platelets: 174 10*3/uL (ref 150–400)
RBC: 2.65 MIL/uL — ABNORMAL LOW (ref 3.87–5.11)
RDW: 18 % — ABNORMAL HIGH (ref 11.5–15.5)
WBC: 8.9 10*3/uL (ref 4.0–10.5)
nRBC: 0.6 % — ABNORMAL HIGH (ref 0.0–0.2)

## 2023-02-06 LAB — GLUCOSE, CAPILLARY
Glucose-Capillary: 117 mg/dL — ABNORMAL HIGH (ref 70–99)
Glucose-Capillary: 139 mg/dL — ABNORMAL HIGH (ref 70–99)
Glucose-Capillary: 127 mg/dL — ABNORMAL HIGH (ref 70–99)

## 2023-02-06 LAB — HEPARIN LEVEL (UNFRACTIONATED)
Heparin Unfractionated: 0.37 IU/mL (ref 0.30–0.70)
Heparin Unfractionated: 0.29 IU/mL — ABNORMAL LOW (ref 0.30–0.70)

## 2023-02-06 LAB — HEMOGLOBIN AND HEMATOCRIT, BLOOD
HCT: 26 % — ABNORMAL LOW (ref 36.0–46.0)
Hemoglobin: 8.4 g/dL — ABNORMAL LOW (ref 12.0–15.0)

## 2023-02-06 MED ORDER — SENNA 8.6 MG PO TABS
1.0000 | ORAL_TABLET | Freq: Two times a day (BID) | ORAL | Status: DC
  Administered 2023-02-06 – 2023-02-08 (×4): 8.6 mg via ORAL
  Filled 2023-02-06 (×4): qty 1

## 2023-02-06 MED ORDER — POLYETHYLENE GLYCOL 3350 17 G PO PACK
17.0000 g | PACK | Freq: Every day | ORAL | Status: DC
  Administered 2023-02-06 – 2023-02-08 (×3): 17 g via ORAL
  Filled 2023-02-06 (×3): qty 1

## 2023-02-06 NOTE — Progress Notes (Signed)
MD notified of blood seen following toileting.  Pt describes it as only on the paper and didn't know if it was vaginal or rectal.  She states she has not had a menses in over 5 years and no history of hemorrhoids.

## 2023-02-06 NOTE — Progress Notes (Signed)
  Progress Note    02/06/2023 8:34 AM 7 Days Post-Op  Subjective:  no major complaints. Still having some right flank pain   Vitals:   02/06/23 0310 02/06/23 0811  BP: 121/61 113/61  Pulse: 65 72  Resp: 15   Temp: 98.1 F (36.7 C) 98.5 F (36.9 C)  SpO2: 95%    Physical Exam: Cardiac:  regular Lungs:  non labored Extremities:  BLE well perfused and warm with palpable femoral and Dp pulses bilaterally. Right groin hematoma and right thigh overall soft Abdomen:  soft Neurologic: alert and oriented  CBC    Component Value Date/Time   WBC 11.0 (H) 02/05/2023 2314   RBC 2.73 (L) 02/05/2023 2314   HGB 8.6 (L) 02/05/2023 2314   HCT 26.8 (L) 02/05/2023 2314   PLT 187 02/05/2023 2314   MCV 98.2 02/05/2023 2314   MCH 31.5 02/05/2023 2314   MCHC 32.1 02/05/2023 2314   RDW 18.3 (H) 02/05/2023 2314   LYMPHSABS 2.6 01/27/2023 1447   MONOABS 0.6 01/27/2023 1447   EOSABS 0.2 01/27/2023 1447   BASOSABS 0.1 01/27/2023 1447    BMET    Component Value Date/Time   NA 133 (L) 02/05/2023 2314   K 4.4 02/05/2023 2314   CL 97 (L) 02/05/2023 2314   CO2 27 02/05/2023 2314   GLUCOSE 130 (H) 02/05/2023 2314   BUN 6 02/05/2023 2314   CREATININE 0.93 02/05/2023 2314   CALCIUM 9.0 02/05/2023 2314   GFRNONAA >60 02/05/2023 2314   GFRAA  10/22/2009 0450    >60        The eGFR has been calculated using the MDRD equation. This calculation has not been validated in all clinical situations. eGFR's persistently <60 mL/min signify possible Chronic Kidney Disease.    INR    Component Value Date/Time   INR 1.5 (H) 02/03/2023 0457     Intake/Output Summary (Last 24 hours) at 02/06/2023 0834 Last data filed at 02/06/2023 0207 Gross per 24 hour  Intake 373.87 ml  Output --  Net 373.87 ml     Assessment/Plan:  52 y.o. female is s/p percutaneous pharmacomechanical thrombectomy with filter removal. IVC stent placement   7 Days Post-Op Developed RP hematoma 2 Days ago. Stable  BLE  well perfused and warm Morning labs pending Hgb 8.6 yesterday afternoon Heparin gtt restarted Transition to [place] today vs tomorrow Mobilize as tolerated Anticipate d/c in next day or two  Marval Regal [facility] [phone] 02/06/2023 8:34 AM  Note entered in delay VASCULAR STAFF ADDENDUM: I have independently interviewed and examined the patient. I agree with the above.  Lovenox tomorrow pending no Hct changes.  Discussed with Dr. Maren Beach yesterday   Cassandria Santee, MD [facility] Phone Number: [phone] 02/07/2023 10:41 AM

## 2023-02-06 NOTE — Progress Notes (Addendum)
ANTICOAGULATION CONSULT NOTE  Pharmacy Consult for Heparin Indication: DVT, protein S and C deficiency   No Known Allergies  Patient Measurements: Height: 5' 4"$  (162.6 cm) Weight: 75.1 kg (165 lb 9.1 oz) IBW/kg (Calculated) : 54.7 Heparin Dosing Weight: 70 kg  Vital Signs: Temp: 98.5 F (36.9 C) (02/13 0811) Temp Source: Oral (02/13 0811) BP: 113/61 (02/13 0811) Pulse Rate: 72 (02/13 0811)  Labs: Recent Labs    02/04/23 0127 02/04/23 0525 02/05/23 0141 02/05/23 1209 02/05/23 1544 02/05/23 2314 02/06/23 0547  HGB 7.2*   < > 9.1* 9.0*  --  8.6* 8.3*  HCT 21.4*   < > 27.5* 27.5*  --  26.8* 26.1*  PLT 149*  --  164  --   --  187 174  HEPARINUNFRC <0.10*  --  <0.10*  --  0.18* 0.29* 0.37  CREATININE 0.76  --  0.73  --   --  0.93  --    < > = values in this interval not displayed.    Estimated Creatinine Clearance: 71.9 mL/min (by C-G formula based on SCr of 0.93 mg/dL).   Assessment: 64 YOF with a medical history significant for Protein C and S deficiency on warfarin PTA s/p IVC filter. Patient was on warfarin PTA but had stopped x 5 days prior for pending gum surgery. Now with new leg pain and swelling found to have extensive bilateral DVTs. Pharmacy consulted to manage heparin.  Per patient, warfarin regimen is 75m daily. Pt is s/p tPA and thrombectomy. Heparin and coumadin restarted 2/06 post procedure. Warfarin and heparin infusion held due to hematoma 2/10, now resumed at lower rate 2/12 AM. Heparin level therapeutic 0.37 on 1700 units/hr. No issues with infusion or bleed reported however hgb still trending down now at 8.3 Per RN, no issues with infusion and no signs of bleeding noticed.   Current anticoagulation plan is to continue IV heparin until hemoglobin and platelets are stable and then transition to therapeutic Lovenox twice daily.   Goal of Therapy:  Heparin level 0.3-0.7 units/ml (targeting lower end of goal) Monitor platelets by anticoagulation protocol:  Yes   Plan:  Continue heparin infusion at 1700 units/hr Check heparin level in 6 hours and daily while on heparin Continue to monitor H&H and platelets F/u plan to transition to Lovenox  Thank you for allowing pharmacy to be a part of this patient's care.  AArdyth Harps PharmD Clinical Pharmacist

## 2023-02-06 NOTE — Evaluation (Signed)
Physical Therapy Evaluation Patient Details Name: Shelby Conner MRN: [id number] DOB: 08/09/1971 Today's Date: 02/06/2023  History of Present Illness  52year old female who presented with right lower extremity edema that began 3 days PTA.  Her chronic Coumadin was held for dental procedure 2 and half week go and inr was as low as 1.4, she had done that in the past.   In the ED underwent further workup: CT abdomen pelvis revealed: hepatic cirrhosis with portal venous hypertension including multiple upper abdominal and esophageal varices as well as mild splenic enlargement.  Inferior vena caval filter is in place with probable venous thrombosis below this level and extending into the pelvic veins. Infiltration in the right iliopsoas region and soft tissues around the right hip likely indicate edema. Vascular surgery was consulted   She was admitted to ICU on heparin drip  S/P pharmacomechanical thrombectomy with filter removal and ivc stent placement, coumadin resumed. She needed multiple prbc transfusions for ABLA.  Patient was complaining of severe right hip pain on 2/10 and CT scan revealed-large right retroperitoneal hematoma> her heparin was initially held for 4 hours then continued but overnight 2/10-11 continues to have drop in hemoglobin and worsening pain.  Vascular again held heparin, will need PRBC ordered, heparin resumed 2/12 am   Clinical Impression  Patient received in bed, she reports soreness on right side( neck, hip, groin), however is able to mobilize in room independently. She is independent with bed mobility and transfers. Patient is slow with gait ~50 feet, but steady and safe. Does not require cues or assistance at this time. Will refer patient to continue mobilizing with mobility specialists or independently at this time for continued improvement.        Recommendations for follow up therapy are one component of a multi-disciplinary discharge planning process, led by the  attending physician.  Recommendations may be updated based on patient status, additional functional criteria and insurance authorization.  Follow Up Recommendations No PT follow up      Assistance Recommended at Discharge PRN  Patient can return home with the following  Assistance with cooking/housework;Assist for transportation    Equipment Recommendations None recommended by PT  Recommendations for Other Services       Functional Status Assessment Patient has had a recent decline in their functional status and demonstrates the ability to make significant improvements in function in a reasonable and predictable amount of time.     Precautions / Restrictions Precautions Precautions: Fall Restrictions Weight Bearing Restrictions: No      Mobility  Bed Mobility Overal bed mobility: Independent                  Transfers Overall transfer level: Independent                      Ambulation/Gait Ambulation/Gait assistance: Independent Gait Distance (Feet): 50 Feet Assistive device: IV Pole Gait Pattern/deviations: Step-through pattern, Decreased step length - right, Decreased step length - left, Decreased stride length, Antalgic Gait velocity: decr     General Gait Details: R LE pain with mobility. pain behind both knees. Slow but steady gait. No assist needed  Stairs            Wheelchair Mobility    Modified Rankin (Stroke Patients Only)       Balance Overall balance assessment: Independent  Pertinent Vitals/Pain Pain Assessment Pain Assessment: Faces Faces Pain Scale: Hurts little more Pain Location: R LE, groin/hip especially Pain Descriptors / Indicators: Discomfort, Sore Pain Intervention(s): Monitored during session    Home Living Family/patient expects to be discharged to:: Private residence Living Arrangements: Spouse/significant other;Children Available Help at  Discharge: Family;Available PRN/intermittently Type of Home: House Home Access: Stairs to enter Entrance Stairs-Rails: Left;Right;Can reach both Entrance Stairs-Number of Steps: 2 in garage   Home Layout: Two level;Able to live on main level with bedroom/bathroom Home Equipment: [place] (2 wheels)      Prior Function Prior Level of Function : Independent/Modified Independent             Mobility Comments: independent pta ADLs Comments: independent pta     Hand Dominance        Extremity/Trunk Assessment   Upper Extremity Assessment Upper Extremity Assessment: Overall WFL for tasks assessed    Lower Extremity Assessment Lower Extremity Assessment: Overall WFL for tasks assessed    Cervical / Trunk Assessment Cervical / Trunk Assessment: Normal  Communication   Communication: No difficulties  Cognition Arousal/Alertness: Awake/alert Behavior During Therapy: WFL for tasks assessed/performed Overall Cognitive Status: Within Functional Limits for tasks assessed                                          General Comments      Exercises     Assessment/Plan    PT Assessment Patient does not need any further PT services  PT Problem List         PT Treatment Interventions      PT Goals (Current goals can be found in the Care Plan section)  Acute Rehab PT Goals Patient Stated Goal: to decrease swelling and pain, go home PT Goal Formulation: With patient Time For Goal Achievement: 02/10/23 Potential to Achieve Goals: Good    Frequency       Co-evaluation               AM-PAC PT "6 Clicks" Mobility  Outcome Measure Help needed turning from your back to your side while in a flat bed without using bedrails?: None Help needed moving from lying on your back to sitting on the side of a flat bed without using bedrails?: None Help needed moving to and from a bed to a chair (including a wheelchair)?: None Help needed standing up  from a chair using your arms (e.g., wheelchair or bedside chair)?: None Help needed to walk in hospital room?: None Help needed climbing 3-5 steps with a railing? : None 6 Click Score: 24    End of Session   Activity Tolerance: Patient limited by pain Patient left: in bed Nurse Communication: Mobility status      Time: [id number] PT Time Calculation (min) (ACUTE ONLY): 9 min   Charges:   PT Evaluation $PT Eval Low Complexity: 1 Low          Chantele Corado, PT, GCS 02/06/23,1:21 PM

## 2023-02-06 NOTE — Progress Notes (Signed)
Called by Jerilynn Birkenhead RN regarding concern for bleeding while on heparin infusion.   Bleeding is described as minimal vaginal spotting. Patient has had extensive hospital stay and history in regards to bleeding and clotting.   Reviewing heparin infusion, was held from 2/10 - 2/12 for large hematoma. Since restarting heparin on 2/12, heparin levels have been either subtherapeutic or on the low end of goal (0.18-0.37 units/mL). Hgb has remained stable from 8.3-9.1.  Discussed with RN: plan to continue heparin infusion at current rate given most recent heparin level 0.29 units/mL. Will monitor vaginal bleeding and if output increases will re-evaluate need to hold.   Please reach out with any further concerns.  Erskine Speed, PharmD Clinical Pharmacist Phone: [phone]

## 2023-02-06 NOTE — Progress Notes (Signed)
PROGRESS NOTE Shelby Conner  [id number] DOB: 1971-01-15 DOA: 01/27/2023 PCP: Laverle Hobby, NP   Brief Narrative/Hospital Course: 52 y.o. female with medical history significant for b/l PE while 8 month pregnant in jan 2006,proximal DVT in 2010, hx of protein s and C deficiency maintained on woumadin, liver cirrhoses followed at [facility] transplant center, and DM2 who presented with right lower extremity edema that began 3 days PTA.  Her chronic Coumadin was held for dental procedure 2 and half week go and inr was as low as 1.4, she had done that in the past.  In the ED underwent further workup: CT abdomen pelvis revealed: hepatic cirrhosis with portal venous hypertension including multiple upper abdominal and esophageal varices as well as mild splenic enlargement.  Inferior vena caval filter is in place with probable venous thrombosis below this level and extending into the pelvic veins. Infiltration in the right iliopsoas region and soft tissues around the right hip likely indicate edema. Vascular surgery was consulted  She was admitted to ICU on heparin drip S/P pharmacomechanical thrombectomy with filter removal and ivc stent placement, coumadin resumed. She needed multiple prbc transfusions for ABLA. Patient was complaining of severe right hip pain on 2/10 and CT scan revealed-large right retroperitoneal hematoma> her heparin was initially held for 4 hours then continued but overnight 2/10-11 continues to have drop in hemoglobin and worsening pain.  Vascular again held heparin, will need PRBC ordered, heparin resumed 2/12 am  Subjective: Patient seen and examined.  Resting comfortably.  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  Heparin held 2/11, resumed 2/12 a.m. recheck H&H overall stable CBC pending this morning.  Continue heparin at lower range per VVS> CBC stable hopefully we can switch to Lovenox and monitor> likely discharge tomorrow. > She will cont lovenox - 1-2 months then transition to Eliquis as OP per Dr kale/hematology/Oncology who will follow-up outpatient. On asa 81 per vvs. Pain managemeng and Rx per vascular as post op. Input appreciated from Dr. Irene Limbo: he discussed various options with the patient, best option would be to continue heparin until her hemoglobin and platelets are stable and then transition to therapeutic dose of Lovenox twice daily for 1 to 2 months then outpatient transition to Eliquis-other alternatives:1) Lovenox bridging and going back on Coumadin and always using Lovenox bridging while holding Coumadin and restarting Coumadin after holding it, b)Lovenox bridging for shorter time like 2 weeks prior to transition to Eliquis.Per pharmacy Lovenox copay is $15/month, DOAC is $50.    Thrombocytopenia:2/2 from liver cirrhosis, acute DVT, also DIC 2/2 lytics- resolved Recent Labs  Lab 02/03/23 0457 02/03/23 1018 02/04/23 0127 02/05/23 0141 02/05/23 2314  PLT 111* 143* 149* 164 187    Retroperitoneal hematoma on right 2/10: Anemia of  acute blood loss Patient has been needing multiple blood transfusion.  Heparin was held in the light of ongoing pain and bleeding per vascular.  Was very upset why  heparin was restarted on 2/10.  Multifactorial w/ blood loss anemia/DIC w/ thrombolytics and procedure. Cont pain control per vascular.  Repeat H&H pending Recent Labs    02/04/23 0525 02/04/23 1934 02/05/23 0141 02/05/23 1209 02/05/23 2314  HGB 7.3* 8.6* 9.1* 9.0* 8.6*  MCV  --   --  95.2  --  98.2   Constipation adding MiraLAX and stool softener Hypomagnesemia:resolved Hypokalemia:  cont kdur 20 daily. T2DM with hyperglycemia: A1c 11.2, poorly controlled.she used to be on metformin 500 mg and injectable medication that she does not know the name.DM coordinator following.Started long-acting insulin-cont 5u daily. Insulin teaching prior to discharge Recent Labs  Lab 02/05/23 0602 02/05/23 1137 02/05/23 1659 02/05/23 2108 02/06/23 0559  GLUCAP 121* 110* 138* 143* 117*   History of cirrhosis with portal venous hypertension with abdominal and esophageal varices/splenic enlargement:  Last viral hepatitis panel in 21 was negative. She reports she did follow-up with a liver transplant at [facility] and was declined transplant and states she is doing well, missed her last appointment ( q49monthy). last MELD in 15.  Advise outpatient follow-up with liver team   Hyponatremia improved,TSH slightly up recheck outpatient. Nonobstructive right nephrolithiasis incidentally noted  Cholelithiasis incidentally noted:Asymptomatic will need outpatient follow-up with PCP  DVT prophylaxis: Heparin infusion Code Status:   Code Status: Full Code Family Communication: plan of care discussed with patient at bedside. Patient status is: Inpatient because of acute DVT needing thrombolytics Level of care: Progressive   Dispo:The patient is from: home           Anticipated disposition:Home  likely in am.  Objective: Vitals last 24 hrs: Vitals:   02/05/23 1943 02/05/23 2335 02/06/23 0310 02/06/23 0811  BP: 126/73 129/68 121/61 113/61  Pulse: 76 98 65 72  Resp: 15 20 15   $ Temp: 98 F (36.7 C) 97.7 F (36.5  C) 98.1 F (36.7 C) 98.5 F (36.9 C)  TempSrc: Oral Oral Oral Oral  SpO2: 100% 91% 95%   Weight:      Height:       Weight change:  Physical Examination: General exam: AA, weak HEENT:Oral mucosa moist, Ear/Nose WNL grossly, dentition normal. Respiratory system: bilaterally clear BS, no use of accessory muscle Cardiovascular system: S1 & S2 +, regular rate. Gastrointestinal system: Abdomen soft, RT flank w/ bruise,BS+ Nervous System:Alert, awake, moving extremities and grossly nonfocal Extremities: LE ankle edema neg, lower extremities warm Skin: No rashes,no icterus. MSK: Normal muscle bulk,tone, power   Medications reviewed:  Scheduled Meds:  sodium chloride   Intravenous Once   sodium chloride   Intravenous Once   aspirin EC  81 mg Oral Daily   chlorhexidine  15 mL Mouth/Throat Once   Or   mouth rinse  15 mL Mouth Rinse Once   insulin aspart  0-15 Units Subcutaneous TID WC   insulin glargine-yfgn  5 Units Subcutaneous QHS   melatonin  3 mg Oral QHS   potassium chloride  20 mEq Oral Daily   Continuous Infusions:  heparin 1,700 Units/hr (02/06/23 0207)   lactated ringers      Diet Order             Diet Carb Modified Fluid consistency: Thin; Room service appropriate? Yes  Diet effective now  Intake/Output Summary (Last 24 hours) at 02/06/2023 1031 Last data filed at 02/06/2023 0207 Gross per 24 hour  Intake 373.87 ml  Output --  Net 373.87 ml    Net IO Since Admission: [place].19 mL [02/06/23 1031]  Wt Readings from Last 3 Encounters:  01/27/23 75.1 kg  07/24/22 72.6 kg  07/13/21 59 kg     Unresulted Labs (From admission, onward)     Start     Ordered   01/29/23 0500  CBC  Daily,   R      01/28/23 2155          Data Reviewed: I have personally reviewed following labs and imaging studies CBC: Recent Labs  Lab 02/03/23 0457 02/03/23 1018 02/03/23 1836 02/04/23 0127 02/04/23 0525 02/04/23 1934 02/05/23 0141 02/05/23 1209  02/05/23 2314  WBC 5.6 7.8  --  9.0  --   --  12.1*  --  11.0*  HGB 7.9* 8.7*   < > 7.2* 7.3* 8.6* 9.1* 9.0* 8.6*  HCT 23.6* 26.2*   < > 21.4* 21.9* 26.0* 27.5* 27.5* 26.8*  MCV 96.7 97.0  --  98.6  --   --  95.2  --  98.2  PLT 111* 143*  --  149*  --   --  164  --  187   < > = values in this interval not displayed.    Basic Metabolic Panel: Recent Labs  Lab 02/02/23 0325 02/03/23 0457 02/04/23 0127 02/05/23 0141 02/05/23 2314  NA 135 137 133* 134* 133*  K 3.6 3.5 4.2 4.4 4.4  CL 103 104 101 101 97*  CO2 23 25 24 $ 21* 27  GLUCOSE 172* 139* 113* 100* 130*  BUN <5* <5* <5* <5* 6  CREATININE 0.54 0.65 0.76 0.73 0.93  CALCIUM 7.5* 7.7* 8.1* 8.7* 9.0    Recent Results (from the past 240 hour(s))  Surgical pcr screen     Status: Abnormal   Collection Time: 01/29/23 10:08 AM   Specimen: Nasal Mucosa; Nasal Swab  Result Value Ref Range Status   MRSA, PCR NEGATIVE NEGATIVE Final   Staphylococcus aureus POSITIVE (A) NEGATIVE Final    Comment: (NOTE) The Xpert SA Assay (FDA approved for NASAL specimens in patients 76 years of age and older), is one component of a comprehensive surveillance program. It is not intended to diagnose infection nor to guide or monitor treatment. Performed at [facility], [place] [address], [place], [place] [address]     Antimicrobials: Anti-infectives (From admission, onward)    Start     Dose/Rate Route Frequency Ordered Stop   01/31/23 0400  ceFAZolin (ANCEF) IVPB 1 g/50 mL premix        1 g 100 mL/hr over 30 Minutes Intravenous Every 8 hours 01/30/23 2200 01/31/23 1223   01/29/23 2000  ceFAZolin (ANCEF) injection 1 g  Status:  Discontinued        1 g Intramuscular Every 8 hours 01/29/23 1205 01/29/23 1508   01/29/23 2000  ceFAZolin (ANCEF) IVPB 1 g/50 mL premix        1 g 100 mL/hr over 30 Minutes Intravenous Every 8 hours 01/29/23 1508 01/30/23 0452   01/29/23 1215  ceFAZolin (ANCEF) IVPB 2g/100 mL premix        2 g 200 mL/hr over 30  Minutes Intravenous  Once 01/29/23 1209 01/29/23 1225   01/29/23 1209  ceFAZolin (ANCEF) 2-4 GM/100ML-% IVPB       Note to Pharmacy: Alphonsus Sias: cabinet override  01/29/23 1209 01/29/23 1251     Radiology Studies: No results found.   LOS: 9 days   Antonieta Pert, MD [facility]  02/06/2023, 10:31 AM

## 2023-02-07 ENCOUNTER — Other Ambulatory Visit (HOSPITAL_COMMUNITY): Payer: Self-pay

## 2023-02-07 LAB — GLUCOSE, CAPILLARY
Glucose-Capillary: 126 mg/dL — ABNORMAL HIGH (ref 70–99)
Glucose-Capillary: 157 mg/dL — ABNORMAL HIGH (ref 70–99)
Glucose-Capillary: 135 mg/dL — ABNORMAL HIGH (ref 70–99)
Glucose-Capillary: 105 mg/dL — ABNORMAL HIGH (ref 70–99)
Glucose-Capillary: 113 mg/dL — ABNORMAL HIGH (ref 70–99)

## 2023-02-07 LAB — CBC
HCT: 25.5 % — ABNORMAL LOW (ref 36.0–46.0)
Hemoglobin: 8.3 g/dL — ABNORMAL LOW (ref 12.0–15.0)
MCH: 31.8 pg (ref 26.0–34.0)
MCHC: 32.5 g/dL (ref 30.0–36.0)
MCV: 97.7 fL (ref 80.0–100.0)
Platelets: 201 10*3/uL (ref 150–400)
RBC: 2.61 MIL/uL — ABNORMAL LOW (ref 3.87–5.11)
RDW: 17.9 % — ABNORMAL HIGH (ref 11.5–15.5)
WBC: 8.8 10*3/uL (ref 4.0–10.5)
nRBC: 0.5 % — ABNORMAL HIGH (ref 0.0–0.2)

## 2023-02-07 LAB — HEPARIN LEVEL (UNFRACTIONATED): Heparin Unfractionated: 0.27 IU/mL — ABNORMAL LOW (ref 0.30–0.70)

## 2023-02-07 MED ORDER — OXYCODONE HCL 5 MG PO TABS
5.0000 mg | ORAL_TABLET | Freq: Four times a day (QID) | ORAL | 0 refills | Status: DC | PRN
  Filled 2023-02-07: qty 28, 7d supply, fill #0

## 2023-02-07 MED ORDER — ENOXAPARIN SODIUM 80 MG/0.8ML IJ SOSY
80.0000 mg | PREFILLED_SYRINGE | Freq: Two times a day (BID) | INTRAMUSCULAR | Status: DC
  Administered 2023-02-07 – 2023-02-08 (×3): 80 mg via SUBCUTANEOUS
  Filled 2023-02-07 (×3): qty 0.8

## 2023-02-07 NOTE — Plan of Care (Signed)

## 2023-02-07 NOTE — Evaluation (Signed)
Occupational Therapy Evaluation Patient Details Name: Shelby Conner MRN: [id number] DOB: 01-Feb-1971 Today's Date: 02/07/2023   History of Present Illness Pt is a 52 y/o F presenting to ED on 2/3 with RLE edema, CT abdomen revealing hepatic cirrhosis with portal venous HTN. S/p pharmacomechanical thrombectomy with filter removal and IVC stent placement on 2/7. PMH includes bilateral PE, proximal DVT, liver cirrhosis, and DM2   Clinical Impression   Pt reports independence at baseline with ADLs and functional mobility, lives with family who can provide PRN assist at d/c. Pt currently needing mod I - min A for ADLs mod I for bed mobility, and supervision for transfers without AD. Some difficulty with reaching RLE for LB dressing but states she will have family to assist at home. Pt educated on use of 3in1 as shower seat for home (already has one), pt verbalized understanding. Pt presenting with impairments listed below, will  follow acutely. Anticipate no OT follow up needs  at d/c.     Recommendations for follow up therapy are one component of a multi-disciplinary discharge planning process, led by the attending physician.  Recommendations may be updated based on patient status, additional functional criteria and insurance authorization.   Follow Up Recommendations  No OT follow up     Assistance Recommended at Discharge PRN  Patient can return home with the following A little help with bathing/dressing/bathroom;Assistance with cooking/housework;Assist for transportation;Help with stairs or ramp for entrance    Functional Status Assessment  Patient has had a recent decline in their functional status and demonstrates the ability to make significant improvements in function in a reasonable and predictable amount of time.  Equipment Recommendations  None recommended by OT (pt has all needed DME)    Recommendations for Other Services       Precautions / Restrictions  Precautions Precautions: Fall Restrictions Weight Bearing Restrictions: No      Mobility Bed Mobility Overal bed mobility: Modified Independent                  Transfers Overall transfer level: Needs assistance Equipment used: None Transfers: Sit to/from Stand Sit to Stand: Supervision                  Balance Overall balance assessment: Mild deficits observed, not formally tested                                         ADL either performed or assessed with clinical judgement   ADL Overall ADL's : Needs assistance/impaired Eating/Feeding: Modified independent   Grooming: Oral care;Standing;Modified independent   Upper Body Bathing: Min guard   Lower Body Bathing: Min guard   Upper Body Dressing : Min guard   Lower Body Dressing: Minimal assistance Lower Body Dressing Details (indicate cue type and reason): for RLE Toilet Transfer: Min guard;Ambulation;Regular Toilet   Toileting- Clothing Manipulation and Hygiene: Supervision/safety   Tub/ Banker: Minimal assistance;Tub transfer   Functional mobility during ADLs: Supervision/safety       Vision   Vision Assessment?: No apparent visual deficits     Perception Perception Perception Tested?: No   Praxis Praxis Praxis tested?: Not tested    Pertinent Vitals/Pain Pain Assessment Pain Assessment: Faces Pain Score: 7  Faces Pain Scale: Hurts whole lot Pain Location: R side/hip/groin/flank Pain Descriptors / Indicators: Discomfort, Sore Pain Intervention(s): Limited activity within patient's tolerance, Monitored during session, Repositioned  Hand Dominance Right   Extremity/Trunk Assessment Upper Extremity Assessment Upper Extremity Assessment: Generalized weakness (reports some L shoulder pain)   Lower Extremity Assessment Lower Extremity Assessment: Defer to PT evaluation   Cervical / Trunk Assessment Cervical / Trunk Assessment: Normal   Communication  Communication Communication: No difficulties   Cognition Arousal/Alertness: Awake/alert Behavior During Therapy: WFL for tasks assessed/performed Overall Cognitive Status: Within Functional Limits for tasks assessed                                       General Comments  VSS on RA, spouse present at end of session    Exercises     Shoulder Instructions      Home Living Family/patient expects to be discharged to:: Private residence Living Arrangements: Spouse/significant other;Children Available Help at Discharge: Family;Available PRN/intermittently;Friend(s) Type of Home: House Home Access: Stairs to enter [employer] of Steps: 2 in garage Entrance Stairs-Rails: Left;Right;Can reach both Home Layout: Two level;Able to live on main level with bedroom/bathroom     Bathroom Shower/Tub: Tub/shower unit;Door         Home Equipment: Conservation officer, nature (2 wheels);BSC/3in1          Prior Functioning/Environment Prior Level of Function : Independent/Modified Independent             Mobility Comments: independent pta ADLs Comments: independent pta        OT Problem List: Decreased strength;Decreased range of motion;Decreased activity tolerance;Impaired balance (sitting and/or standing)      OT Treatment/Interventions: Self-care/ADL training;Therapeutic exercise;Energy conservation;DME and/or AE instruction;Therapeutic activities;Patient/family education;Balance training    OT Goals(Current goals can be found in the care plan section) Acute Rehab OT Goals Patient Stated Goal: to go home OT Goal Formulation: With patient Time For Goal Achievement: 02/21/23 Potential to Achieve Goals: Good ADL Goals Pt Will Perform Lower Body Dressing: sitting/lateral leans;sit to/from stand;bed level;with adaptive equipment;Independently Pt Will Transfer to Toilet: ambulating;regular height toilet;Independently Pt Will Perform Tub/Shower Transfer: Tub  transfer;Shower transfer;Independently;ambulating  OT Frequency: Min 2X/week    Co-evaluation              AM-PAC OT "6 Clicks" Daily Activity     Outcome Measure Help from another person eating meals?: None Help from another person taking care of personal grooming?: None Help from another person toileting, which includes using toliet, bedpan, or urinal?: A Little Help from another person bathing (including washing, rinsing, drying)?: A Little Help from another person to put on and taking off regular upper body clothing?: None Help from another person to put on and taking off regular lower body clothing?: A Little 6 Click Score: 21   End of Session Nurse Communication: Mobility status  Activity Tolerance: Patient tolerated treatment well Patient left: in bed;with call bell/phone within reach;with family/visitor present  OT Visit Diagnosis: Unsteadiness on feet (R26.81);Other abnormalities of gait and mobility (R26.89);Muscle weakness (generalized) (M62.81)                Time: [id number] OT Time Calculation (min): 19 min Charges:  OT General Charges $OT Visit: 1 Visit OT Evaluation $OT Eval Moderate Complexity: 1 Mod  Jaidev Sanger K, OTD, OTR/L SecureChat Preferred Acute Rehab [phone]   Renaye Rakers Koonce 02/07/2023, 8:33 AM

## 2023-02-07 NOTE — Progress Notes (Signed)
Pt had no concerns or signs of bleeding overnight. Day time RN updated.

## 2023-02-07 NOTE — Progress Notes (Addendum)
[facility] [place]  Subjective  - Doing better ready to go home   Objective 102/60 63 98.3 F (36.8 C) (Oral) 20 96%  Intake/Output Summary (Last 24 hours) at 02/07/2023 [id number] Last data filed at 02/06/2023 1049 Gross per 24 hour  Intake 146.79 ml  Output --  Net 146.79 ml    Feet warm well perfused, no edema Right groin compressible with mild discomfort Abdomin soft NTTP grossly Lungs non labored breathing  Assessment/Planning: 52 y.o. female is s/p percutaneous pharmacomechanical thrombectomy with filter removal. IVC stent placement   8 Days Post-Op Developed RP hematoma 3 Days ago. Stable   Plan per primary team is best option would be to continue heparin until her hemoglobin and platelets are stable and then transition to therapeutic dose of Lovenox twice daily for 1 to 2 months then outpatient transition to Eliquis-other alternatives:1) Lovenox bridging and going back on Coumadin and always using Lovenox bridging while holding Coumadin and restarting Coumadin after holding it, b)Lovenox bridging for shorter time like 2 weeks prior to transition to Eliquis.Per pharmacy Lovenox copay is $15/month, DOAC is $50.     I will send pain medication script and f/u with our office per Dr. Virl Cagey She has thigh high compression 20-30 mm hg Exercise alternating with elevation when at rest   Roxy Horseman 02/07/2023 7:18 AM --  VASCULAR STAFF ADDENDUM: I have independently interviewed and examined the patient. I agree with the above.  Home with lovenox pending PT/OT eval  Pt asked to stay one more day to ensure no bleeding on lovenox   Cassandria Santee, MD [facility] Phone Number: [phone] 02/07/2023 10:42 AM    Laboratory Lab Results: Recent Labs    02/06/23 0547 02/06/23 1634 02/07/23 0056  WBC 8.9  --  8.8  HGB 8.3* 8.4* 8.3*  HCT 26.1* 26.0* 25.5*  PLT 174  --  201   BMET Recent Labs     02/05/23 0141 02/05/23 2314  NA 134* 133*  K 4.4 4.4  CL 101 97*  CO2 21* 27  GLUCOSE 100* 130*  BUN <5* 6  CREATININE 0.73 0.93  CALCIUM 8.7* 9.0    COAG Lab Results  Component Value Date   INR 1.5 (H) 02/03/2023   INR 1.9 (H) 02/02/2023   INR 2.7 (H) 02/01/2023   No results found for: "PTT"

## 2023-02-07 NOTE — Progress Notes (Signed)
PROGRESS NOTE Shelby Conner  [id number] DOB: [birth year] DOA: 01/27/2023 PCP: Laverle Hobby, NP   Brief Narrative/Hospital Course: 52 y.o. female with medical history significant for b/l PE while 8 month pregnant in jan 2006,proximal DVT in 2010, hx of protein s and C deficiency maintained on woumadin, liver cirrhoses followed at [facility] transplant center, and DM2 who presented with right lower extremity edema that began 3 days PTA.  Her chronic Coumadin was held for dental procedure 2 and half week go and inr was as low as 1.4, she had done that in the past.  In the ED underwent further workup: CT abdomen pelvis revealed: hepatic cirrhosis with portal venous hypertension including multiple upper abdominal and esophageal varices as well as mild splenic enlargement.  Inferior vena caval filter is in place with probable venous thrombosis below this level and extending into the pelvic veins. Infiltration in the right iliopsoas region and soft tissues around the right hip likely indicate edema. Vascular surgery was consulted  She was admitted to ICU on heparin drip S/P pharmacomechanical thrombectomy with filter removal and ivc stent placement, coumadin resumed. She needed multiple prbc transfusions for ABLA. Patient was complaining of severe right hip pain on 2/10 and CT scan revealed-large right retroperitoneal hematoma> her heparin was initially held for 4 hours then continued but overnight 2/10-11 continues to have drop in hemoglobin and worsening pain.  Vascular again held heparin, heparin resumed 2/12 am At this time hemoglobin remains overall stable of 8 g. 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  Heparin held 2/11, resumed 2/12 a.m. recheck H&H overall stable CBC holding in close to mid 8 g range, discussed with Dr. Virl Cagey from vascular transition to Lovenox today monitor overnight as patient is worried about bleeding.  Per hem-onc plans:She will cont lovenox - 1-2 months then transition to Eliquis as OP per Dr kale/hematology/Oncology who will follow-up outpatient. On asa 81 per vvs.  Input appreciated from Dr. Irene Limbo: he discussed various options with the patient, best option would be to continue heparin until her hemoglobin and platelets are stable and then transition to therapeutic dose of Lovenox twice daily for 1 to 2 months then outpatient transition to Eliquis-other alternatives:1) Lovenox bridging and going back on Coumadin and always using Lovenox bridging while holding Coumadin and restarting Coumadin after holding it, b)Lovenox bridging for shorter time like 2 weeks prior to transition to Eliquis.Per pharmacy Lovenox copay is $15/month, DOAC is $50.    Thrombocytopenia:2/2 from liver cirrhosis, acute DVT, also DIC 2/2 lytics- resolved Recent Labs  Lab 02/04/23 0127 02/05/23 0141 02/05/23 2314 02/06/23 0547 02/07/23 0056  PLT 149* 164 187 174  201    Retroperitoneal hematoma on right 2/10: Anemia of acute blood loss Hemoglobin seems to have stabilized, monitor closely.    Multifactorial w/ blood loss anemia/DIC w/ thrombolytics and procedure. Cont pain control per vascular.  Repeat H&H pending She noticed small spot bleeding on wiping of her perineal area-no recurrence since. Recent Labs    02/05/23 1209 02/05/23 2314 02/06/23 0547 02/06/23 1634 02/07/23 0056  HGB 9.0* 8.6* 8.3* 8.4* 8.3*  MCV  --  98.2 98.5  --  97.7   Constipation cont MiraLAX and stool softener Hypomagnesemia:resolved Hypokalemia:  cont kdur 20 daily.  T2DM with hyperglycemia: A1c 11.2, poorly controlled.she used to be on metformin 500 mg and injectable medication that she does not know the name.DM coordinator following.Started long-acting insulin-cont 5u daily> will plan to discharge on long-acting insulin low-dose and have her follow-up with her PCP informed that she needs to check her blood sugar 3 times a day if not  4. Recent Labs  Lab 02/06/23 0559 02/06/23 1152 02/06/23 2052 02/07/23 0630 02/07/23 0839  GLUCAP 117* 139* 127* 126* 157*   History of cirrhosis with portal venous hypertension with abdominal and esophageal varices/splenic enlargement:  Last viral hepatitis panel in 21 was negative. She reports she did follow-up with a liver transplant at [facility] and was declined transplant and states she is doing well, missed her last appointment ( q23monthy). last MELD in 15.  Advise outpatient follow-up with liver team   Hyponatremia improved,TSH slightly up recheck outpatient. Nonobstructive right nephrolithiasis incidentally noted  Cholelithiasis incidentally noted:Asymptomatic will need outpatient follow-up with PCP  DVT prophylaxis: Heparin infusion Code Status:   Code Status: Full Code Family Communication: plan of care discussed with patient at bedside. Patient status is: Inpatient because of acute DVT needing thrombolytics Level of care: Progressive   Dispo:The patient is from: home           Anticipated disposition:Home tomorrow if remains  stable  Objective: Vitals last 24 hrs: Vitals:   02/06/23 2049 02/07/23 0056 02/07/23 0530 02/07/23 1138  BP: 109/61 (!) 101/43 102/60 113/69  Pulse: 64 96 63 69  Resp: 20 18 20 19  $ Temp: 99.1 F (37.3 C) 98.4 F (36.9 C) 98.3 F (36.8 C) 97.8 F (36.6 C)  TempSrc: Oral Oral Oral Oral  SpO2: 100% 94% 96% 93%  Weight:      Height:       Weight change:  Physical Examination: General exam: AA0x3, weak,older appearing HEENT:Oral mucosa moist, Ear/Nose WNL grossly, dentition normal. Respiratory system: bilaterally clear BS, no use of accessory muscle Cardiovascular system: S1 & S2 +, regular rate. Gastrointestinal system: Abdomen soft, NT,ND,BS+ Nervous System:Alert, awake, moving extremities and grossly nonfocal Extremities: LE ankle edema mild, lower extremities warm Skin: No rashes,no icterus. MSK: Normal muscle bulk,tone, power    Medications reviewed:  Scheduled Meds:  sodium chloride   Intravenous Once   sodium chloride   Intravenous Once   aspirin EC  81 mg Oral Daily   chlorhexidine  15 mL Mouth/Throat Once   Or   mouth rinse  15 mL Mouth Rinse Once   insulin aspart  0-15 Units Subcutaneous TID WC   insulin glargine-yfgn  5 Units Subcutaneous QHS   melatonin  3 mg Oral QHS   polyethylene glycol  17 g Oral Daily   potassium chloride  20 mEq Oral Daily   senna  1 tablet Oral BID   Continuous Infusions:  heparin 1,800 Units/hr (02/07/23 0855)   lactated ringers  Diet Order             Diet Carb Modified Fluid consistency: Thin; Room service appropriate? Yes  Diet effective now                 No intake or output data in the 24 hours ending 02/07/23 1141  Net IO Since Admission: 4,589.98 mL [02/07/23 1141]  Wt Readings from Last 3 Encounters:  01/27/23 75.1 kg  07/24/22 72.6 kg  07/13/21 59 kg     Unresulted Labs (From admission, onward)     Start     Ordered   02/07/23 1600  Heparin level (unfractionated)  Once-Timed,   TIMED        Question:  Specimen collection method  Answer:  Lab=Lab collect   02/07/23 0807   02/07/23 1138  Glucose, capillary  Once,   R        02/07/23 1138   02/07/23 0500  Heparin level (unfractionated)  Daily,   R     Question:  Specimen collection method  Answer:  Lab=Lab collect   02/06/23 1128   02/06/23 1617  Occult blood card to lab, stool  Once,   R        02/06/23 1617          Data Reviewed: I have personally reviewed following labs and imaging studies CBC: Recent Labs  Lab 02/04/23 0127 02/04/23 0525 02/05/23 0141 02/05/23 1209 02/05/23 2314 02/06/23 0547 02/06/23 1634 02/07/23 0056  WBC 9.0  --  12.1*  --  11.0* 8.9  --  8.8  HGB 7.2*   < > 9.1* 9.0* 8.6* 8.3* 8.4* 8.3*  HCT 21.4*   < > 27.5* 27.5* 26.8* 26.1* 26.0* 25.5*  MCV 98.6  --  95.2  --  98.2 98.5  --  97.7  PLT 149*  --  164  --  187 174  --  201   < > = values in this interval not displayed.    Basic Metabolic Panel: Recent Labs  Lab 02/02/23 0325 02/03/23 0457 02/04/23 0127 02/05/23 0141 02/05/23 2314  NA 135 137 133* 134* 133*  K 3.6 3.5 4.2 4.4 4.4  CL 103 104 101 101 97*  CO2 23 25 24 $ 21* 27  GLUCOSE 172* 139* 113* 100* 130*  BUN <5* <5* <5* <5* 6  CREATININE 0.54 0.65 0.76 0.73 0.93  CALCIUM 7.5* 7.7* 8.1* 8.7* 9.0    Recent Results (from the past 240 hour(s))  Surgical pcr screen     Status: Abnormal   Collection Time: 01/29/23 10:08 AM   Specimen: Nasal Mucosa; Nasal Swab  Result Value Ref Range Status   MRSA, PCR NEGATIVE NEGATIVE Final   Staphylococcus aureus POSITIVE (A) NEGATIVE Final    Comment: (NOTE) The Xpert SA Assay (FDA approved for NASAL specimens in patients 76 years of age and older), is one component of a comprehensive surveillance program. It is not intended to diagnose infection nor to guide or monitor treatment. Performed at [facility], [place] [address], [place], [place] [address]     Antimicrobials: Anti-infectives (From admission, onward)    Start      Dose/Rate Route Frequency Ordered Stop   01/31/23 0400  ceFAZolin (ANCEF) IVPB 1 g/50 mL premix        1 g 100 mL/hr over 30 Minutes Intravenous Every 8 hours 01/30/23 2200 01/31/23 1223   01/29/23 2000  ceFAZolin (ANCEF) injection 1 g  Status:  Discontinued  1 g Intramuscular Every 8 hours 01/29/23 1205 01/29/23 1508   01/29/23 2000  ceFAZolin (ANCEF) IVPB 1 g/50 mL premix        1 g 100 mL/hr over 30 Minutes Intravenous Every 8 hours 01/29/23 1508 01/30/23 0452   01/29/23 1215  ceFAZolin (ANCEF) IVPB 2g/100 mL premix        2 g 200 mL/hr over 30 Minutes Intravenous  Once 01/29/23 1209 01/29/23 1225   01/29/23 1209  ceFAZolin (ANCEF) 2-4 GM/100ML-% IVPB       Note to Pharmacy: Alphonsus Sias: cabinet override      01/29/23 1209 01/29/23 1251     Radiology Studies: No results found.   LOS: 10 days   Antonieta Pert, MD [facility]  02/07/2023, 11:41 AM

## 2023-02-07 NOTE — Progress Notes (Addendum)
ANTICOAGULATION CONSULT NOTE  Pharmacy Consult for Heparin > Enoxaparin Indication: DVT, protein S and C deficiency   No Known Allergies  Patient Measurements: Height: 5' 4"$  (162.6 cm) Weight: 75.1 kg (165 lb 9.1 oz) IBW/kg (Calculated) : 54.7 Heparin Dosing Weight: 70 kg  Vital Signs: Temp: 97.8 F (36.6 C) (02/14 1138) Temp Source: Oral (02/14 1138) BP: 113/69 (02/14 1138) Pulse Rate: 69 (02/14 1138)  Labs: Recent Labs    02/05/23 0141 02/05/23 1209 02/05/23 2314 02/06/23 0547 02/06/23 1338 02/06/23 1634 02/07/23 0056  HGB 9.1*   < > 8.6* 8.3*  --  8.4* 8.3*  HCT 27.5*   < > 26.8* 26.1*  --  26.0* 25.5*  PLT 164  --  187 174  --   --  201  HEPARINUNFRC <0.10*   < > 0.29* 0.37 0.29*  --  0.27*  CREATININE 0.73  --  0.93  --   --   --   --    < > = values in this interval not displayed.    Estimated Creatinine Clearance: 71.9 mL/min (by C-G formula based on SCr of 0.93 mg/dL).   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 Patient was on warfarin PTA (per patient, warfarin regimen is 65m daily) but had stopped x 5 days prior for pending gum surgery. Now with new leg pain and swelling found to have extensive bilateral DVTs. Pharmacy consulted to manage heparin.   Current anticoagulation plan is to continue IV heparin until hemoglobin and platelets are stable and then transition to therapeutic Lovenox twice daily.   Pt is s/p tPA and thrombectomy. Heparin and coumadin restarted 2/06 post procedure. Warfarin and heparin infusion held due to hematoma 2/10, heparin now resumed at lower rate 2/12 AM.    Per RN 2/13 PM, concern for bleeding while on heparin infusion. Bleeding is described as minimal vaginal spotting. Per RN 2/14 AM, no further issues with bleeding reported. Hgb remains low stable 8.3, platelets 201.  Goal of Therapy:  Heparin level 0.3-0.7 units/ml (targeting lower end of  goal) Monitor platelets by anticoagulation protocol: Yes   Plan:  Stop heparin infusion Start enoxaparin 814mSQ Q12h  Continue to monitor H&H and platelets  Thank you for allowing pharmacy to be a part of this patient's care.  AdArdyth HarpsPharmD Clinical Pharmacist

## 2023-02-08 ENCOUNTER — Other Ambulatory Visit (HOSPITAL_COMMUNITY): Payer: Self-pay

## 2023-02-08 ENCOUNTER — Inpatient Hospital Stay (HOSPITAL_COMMUNITY): Payer: BC Managed Care – PPO

## 2023-02-08 LAB — CBC
HCT: 26.8 % — ABNORMAL LOW (ref 36.0–46.0)
Hemoglobin: 8.9 g/dL — ABNORMAL LOW (ref 12.0–15.0)
MCH: 32.7 pg (ref 26.0–34.0)
MCHC: 33.2 g/dL (ref 30.0–36.0)
MCV: 98.5 fL (ref 80.0–100.0)
Platelets: 218 10*3/uL (ref 150–400)
RBC: 2.72 MIL/uL — ABNORMAL LOW (ref 3.87–5.11)
RDW: 17.3 % — ABNORMAL HIGH (ref 11.5–15.5)
WBC: 7.7 10*3/uL (ref 4.0–10.5)
nRBC: 0.3 % — ABNORMAL HIGH (ref 0.0–0.2)

## 2023-02-08 LAB — GLUCOSE, CAPILLARY
Glucose-Capillary: 105 mg/dL — ABNORMAL HIGH (ref 70–99)
Glucose-Capillary: 135 mg/dL — ABNORMAL HIGH (ref 70–99)
Glucose-Capillary: 145 mg/dL — ABNORMAL HIGH (ref 70–99)

## 2023-02-08 MED ORDER — INSULIN PEN NEEDLE 32G X 4 MM MISC
1.0000 | Freq: Every day | 0 refills | Status: AC
  Filled 2023-02-08: qty 100, 30d supply, fill #0

## 2023-02-08 MED ORDER — ACCU-CHEK SOFTCLIX LANCETS MISC
1.0000 | Freq: Three times a day (TID) | 0 refills | Status: AC
  Filled 2023-02-08: qty 100, 30d supply, fill #0

## 2023-02-08 MED ORDER — GLUCOSE BLOOD VI STRP
1.0000 | ORAL_STRIP | Freq: Three times a day (TID) | 0 refills | Status: AC
  Filled 2023-02-08: qty 100, 30d supply, fill #0

## 2023-02-08 MED ORDER — ASPIRIN 81 MG PO TBEC
81.0000 mg | DELAYED_RELEASE_TABLET | Freq: Every day | ORAL | 0 refills | Status: AC
  Filled 2023-02-08: qty 30, 30d supply, fill #0

## 2023-02-08 MED ORDER — LANCET DEVICE MISC
1.0000 | Freq: Three times a day (TID) | 0 refills | Status: AC
  Filled 2023-02-08: qty 1, 30d supply, fill #0

## 2023-02-08 MED ORDER — SENNA 8.6 MG PO TABS
1.0000 | ORAL_TABLET | Freq: Two times a day (BID) | ORAL | 0 refills | Status: DC
  Filled 2023-02-08: qty 120, 60d supply, fill #0

## 2023-02-08 MED ORDER — INSULIN DETEMIR 100 UNIT/ML FLEXPEN
5.0000 [IU] | PEN_INJECTOR | Freq: Every day | SUBCUTANEOUS | 0 refills | Status: DC
  Filled 2023-02-08: qty 3, 42d supply, fill #0

## 2023-02-08 MED ORDER — ACCU-CHEK GUIDE W/DEVICE KIT
1.0000 | PACK | Freq: Three times a day (TID) | 0 refills | Status: DC
  Filled 2023-02-08: qty 1, 30d supply, fill #0

## 2023-02-08 NOTE — Progress Notes (Signed)
Mobility Specialist Progress Note   02/08/23 1154  Therapy Vitals  Resp 17  BP 113/74 (post ambulation)  Orthostatic Lying   BP- Lying 93/57  Orthostatic Sitting  BP- Sitting 111/65  Pulse- Sitting 69  Orthostatic Standing at 0 minutes  BP- Standing at 0 minutes 101/68  Pulse- Standing at 0 minutes 75  Mobility  Activity Ambulated with assistance in hallway;Dangled on edge of bed  Level of Assistance Modified independent, requires aide device or extra time  Assistive Device None  Distance Ambulated (ft) 120 ft  Range of Motion/Exercises Active;All extremities  Activity Response Tolerated well   Pre Ambulation:  HR 69 During Ambulation: HR 75 Post Ambulation: HR 76  Patient received in supine and agreeable to participate. Completed ortho VS prior to ambulation without any significant changes. Ambulated mod I with steady gait. Returned to room without complaint of dizziness or lightheadedness. Was left in supine with all needs met, call bell in reach.   Shelby Conner, BS EXP Mobility Specialist Please contact via SecureChat or Rehab office at [phone]

## 2023-02-08 NOTE — Progress Notes (Signed)
Mobility Specialist Progress Note  SATURATION QUALIFICATIONS: (This note is used to comply with regulatory documentation for home oxygen)  Patient Saturations on Room Air at Rest = 98%  Patient Saturations on Room Air while Ambulating = 99%  Patient Saturations on 0 Liters of oxygen while Ambulating = n/a%  Please briefly explain why patient needs home oxygen:  Shelby Conner, BS EXP Mobility Specialist Please contact via SecureChat or Rehab office at [phone]

## 2023-02-08 NOTE — Discharge Summary (Signed)
Physician Discharge Summary  Shelby Conner [id number] DOB: [birth year] DOA: 01/27/2023  PCP: Laverle Hobby, NP  Admit date: 01/27/2023 Discharge date: 02/08/2023 Recommendations for Outpatient Follow-up:  Follow up with PCP in 1 weeks-call for appointment Dr. Irene Limbo from hematology oncology in 1 month to discuss whether further anticoagulation Vascular surgery for follow-up Please obtain BMP/CBC in one week Follow-up with PCP for DM management  Discharge Dispo: home Discharge Condition: Stable Code Status:   Code Status: Full Code Diet recommendation:  Diet Order             Diet Carb Modified Fluid consistency: Thin; Room service appropriate? Yes  Diet effective now                    Brief/Interim Summary: 52 y.o. female with medical history significant for b/l PE while 8 month pregnant in jan 2006,proximal DVT in 2010, hx of protein s and C deficiency maintained on woumadin, liver cirrhoses followed at [facility] transplant center, and DM2 who presented with right lower extremity edema that began 3 days PTA.  Her chronic Coumadin was held for dental procedure 2 and half week go and inr was as low as 1.4, she had done that in the past.  In the ED underwent further workup: CT abdomen pelvis revealed: hepatic cirrhosis with portal venous hypertension including multiple upper abdominal and esophageal varices as well as mild splenic enlargement.  Inferior vena caval filter is in place with probable venous thrombosis below this level and extending into the pelvic veins. Infiltration in the right iliopsoas region and soft tissues around the right hip likely indicate edema. Vascular surgery was consulted  She was admitted to ICU on heparin drip S/P pharmacomechanical thrombectomy with filter removal and ivc stent placement, coumadin resumed. She needed multiple prbc transfusions for ABLA. Patient was complaining of severe right hip pain on 2/10 and CT scan revealed-large right  retroperitoneal hematoma> her heparin was initially held for 4 hours then continued but overnight 2/50-11 continues to have drop in hemoglobin and worsening pain.  Vascular again held heparin, heparin resumed 2/12 am At this time hemoglobin remains overall stable of 8 g.  Patient's heparin switched to Lovenox 50/14 and monitored additional night. No evidence of bleeding hemoglobin remains elevated increasing 8.9 g, cleared for discharge by vascular surgery.   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  Heparin held 50/11, resumed 2/12 a.m. recheck H&H overall stable CBC holding in close to mid 8 g range, discussed with Dr. Virl Cagey from vascular transitioned to Lovenox 50/14 and monitored additional night. No evidence of bleeding hemoglobin remains elevated increasing 8.9 g, cleared for discharge by vascular surgery.  Input appreciated from Dr. Irene Limbo: he discussed various options with the patient, best option would be to continue heparin until her hemoglobin and platelets are stable and then transition to  therapeutic dose of Lovenox twice daily for 1 to 2 months then outpatient transition to Eliquis-other alternatives:1) Lovenox bridging and going back on Coumadin and always using Lovenox bridging while holding Coumadin and restarting  Coumadin after holding it, b)Lovenox bridging for shorter time like 2 weeks prior to transition to Eliquis.Per pharmacy Lovenox copay is $15/month, DOAC is $50.    Thrombocytopenia:50/2 from liver cirrhosis, acute DVT, also DIC 2/2 lytics- resolved. 218 now.  Retroperitoneal hematoma on right 2/10: Anemia of acute blood loss Hemoglobin seems to have stabilized, monitor closely.   Multifactorial w/ blood loss anemia/DIC w/ thrombolytics and procedure.  Hemoglobin improving no further drop, continue pain control per vascular and follow-up outpatient.  Recent Labs    02/05/23 2314 02/06/23 0547 02/06/23 1634 02/07/23 0056 02/08/23 0218  HGB 8.6* 8.3* 8.4* 8.3* 8.9*  MCV 98.2 98.5  --  97.7 98.5   Constipation cont MiraLAX and stool softener Hypomagnesemia:resolved Hypokalemia:  cont kdur 20 daily.  T2DM with hyperglycemia: A1c 11.2, poorly controlled.she used to be on metformin 500 mg and injectable medication that she does not know the name.DM coordinator following.Started long-acting insulin-cont 5u daily> will plan to discharge on long-acting insulin low-dose and have her follow-up with her PCP informed that she needs to check her blood sugar 3 times a day if not  4. Recent Labs  Lab 02/07/23 1138 02/07/23 1558 02/07/23 2141 02/08/23 0226 02/08/23 0600  GLUCAP 135* 105* 113* 105* 135*   History of cirrhosis with portal venous hypertension with abdominal and esophageal varices/splenic enlargement:  Last viral hepatitis panel in 21 was negative. She reports she did follow-up with a liver transplant at [facility] and was declined transplant and states she is doing well, missed her last appointment ( q49monthy). last MELD in 15.  Advise outpatient follow-up with liver team   Hyponatremia improved,TSH slightly up recheck outpatient. Nonobstructive right nephrolithiasis incidentally noted  Cholelithiasis incidentally noted:Asymptomatic will need outpatient follow-up with PCP  ?  Occasional  dizziness/vague symptoms, especially during night: Neuroexam done nonfocal no sensory deficit no double vision no diplopia, given her vasculopath history CT head obtained for completeness and no acute finding.  Could be in the setting of patient's anxiety issues, prolonged hospitalization/stress, and plans to follow-up with PCP  Consults: VVS, hem onc  Subjective: Alert awake oriented resting comfortably, eager to leave for home today.  Discharge Exam: Vitals:   02/08/23 0800 02/08/23 0900  BP:    Pulse: 60 60  Resp: 19 16  Temp:    SpO2: 95% 97%   General: Pt is alert, awake, not in acute distress Cardiovascular: RRR, S1/S2 +, no rubs, no gallops Respiratory: CTA bilaterally, no wheezing, no rhonchi Abdominal: Soft, NT, ND, bowel sounds + Extremities: no edema, no cyanosis  Discharge Instructions  Discharge Instructions     Discharge instructions   Complete by: As directed    Follow-up with hematology oncology regarding anticoagulation plan Follow-up with PCP to check CBC BMP within a week  Please call call MD or return to ER for similar or worsening recurring problem that brought you to hospital or if any fever,nausea/vomiting,abdominal pain, uncontrolled pain, chest pain,  shortness of breath or any other alarming symptoms.  Please follow-up your doctor as instructed in a week time and call the office for appointment.  Please avoid alcohol, smoking, or any other illicit substance and maintain healthy habits including taking your regular medications as prescribed.  You were cared for by a hospitalist during your hospital stay. If  you have any questions about your discharge medications or the care you received while you were in the hospital after you are discharged, you can call the unit and ask to speak with the hospitalist on call if the hospitalist that took care of you is not available.  Once you are discharged, your primary care physician will handle any further medical  issues. Please note that NO REFILLS for any discharge medications will be authorized once you are discharged, as it is imperative that you return to your primary care physician (or establish a relationship with a primary care physician if you do not have one) for your aftercare needs so that they can reassess your need for medications and monitor your lab values   Check blood sugar 3 times a day and bedtime at home. If blood sugar running above 200 less than 70 please call your MD to adjust insulin. If blood sugars running less 100 do not use insulin and call MD. If you noticed signs and symptoms of hypoglycemia or low blood sugar like jitteriness, confusion, thirst, tremor, sweating- Check blood sugar, drink sugary drink/biscuits/sweets to increase sugar level and call MD or return to ER.   Increase activity slowly   Complete by: As directed       Allergies as of 02/08/2023   No Known Allergies      Medication List     STOP taking these medications    feeding supplement Liqd   ibuprofen 200 MG tablet Commonly known as: ADVIL   multivitamin with minerals Tabs tablet   ondansetron 4 MG disintegrating tablet Commonly known as: ZOFRAN-ODT   pantoprazole 40 MG tablet Commonly known as: PROTONIX   warfarin 1 MG tablet Commonly known as: Coumadin   warfarin 6 MG tablet Commonly known as: COUMADIN       TAKE these medications    aspirin EC 81 MG tablet Take 1 tablet (81 mg total) by mouth daily. Swallow whole.   Blood Glucose Monitoring Suppl Devi 1 each by Does not apply route in the morning, at noon, and at bedtime. May substitute to any manufacturer covered by patient's insurance.   BLOOD GLUCOSE TEST STRIPS Strp 1 each by In Vitro route in the morning, at noon, and at bedtime. May substitute to any manufacturer covered by patient's insurance.   enoxaparin 80 MG/0.8ML injection Commonly known as: LOVENOX Inject 0.8 mLs (80 mg total) into the skin 2 (two) times  daily.   insulin detemir 100 UNIT/ML FlexPen Commonly known as: LEVEMIR Inject 5 Units into the skin at bedtime.   Insulin Pen Needle 31G X 5 MM Misc 1 each by Does not apply route daily at 6 (six) AM.   Lancet Device Misc 1 each by Does not apply route in the morning, at noon, and at bedtime. May substitute to any manufacturer covered by patient's insurance.   Lancets Misc. Misc 1 each by Does not apply route in the morning, at noon, and at bedtime. May substitute to any manufacturer covered by patient's insurance.   oxyCODONE 5 MG immediate release tablet Commonly known as: Oxy IR/ROXICODONE Take 1 tablet (5 mg total) by mouth every 6 (six) hours as needed for moderate pain.   potassium chloride 10 MEQ tablet Commonly known as: KLOR-CON Take 10 mEq by mouth daily.   senna 8.6 MG Tabs tablet Commonly known as: SENOKOT Take 1 tablet (8.6 mg total) by mouth 2 (two) times daily.   VISINE OP Place 1 drop into both eyes 2 (  two) times daily as needed (dry eyes, itchy eyes).        Follow-up Information     Broadus John, MD Follow up in 4 week(s).   Specialty: Vascular Surgery Why: Office will call you to arrange your appt (sent) Contact information: [place] [address] [place], NP Follow up in 1 week(s).   Specialty: Nurse Practitioner Contact information: [address] [place] [address] [phone]         Brunetta Genera, MD Follow up in 1 month(s).   Specialties: Hematology, Oncology Contact information: [place] [address] [phone]                No Known Allergies  The results of significant diagnostics from this hospitalization (including imaging, microbiology, ancillary and laboratory) are listed below for reference.    Microbiology: No results found for this or any previous visit (from the past 240 hour(s)).   Procedures/Studies: CT  HEAD WO CONTRAST (5MM)  Result Date: 02/08/2023 CLINICAL DATA:  Syncope/presyncope with cerebrovascular cause suspected. Dizziness EXAM: CT HEAD WITHOUT CONTRAST TECHNIQUE: Contiguous axial images were obtained from the base of the skull through the vertex without intravenous contrast. RADIATION DOSE REDUCTION: This exam was performed according to the departmental dose-optimization program which includes automated exposure control, adjustment of the mA and/or kV according to patient size and/or use of iterative reconstruction technique. COMPARISON:  07/24/2022 FINDINGS: Brain: No evidence of acute infarction, hemorrhage, hydrocephalus, extra-axial collection or mass lesion/mass effect. Vascular: No hyperdense vessel or unexpected calcification. Skull: Normal. Negative for fracture or focal lesion. Sinuses/Orbits: No acute finding. IMPRESSION: Stable and negative head CT. Electronically Signed   By: Jorje Guild M.D.   On: 02/08/2023 11:13   CT Angio Abd/Pel w/ and/or w/o  Result Date: 02/03/2023 CLINICAL DATA:  Status post catheter thrombectomy of the IVC and iliac veins, complicated IVC filter retrieval, IVC stent reconstruction, acute abdominal pain, suspect retroperitoneal hematoma. Patient remains on heparin IV. EXAM: CT ANGIOGRAPHY ABDOMEN AND PELVIS WITH CONTRAST AND WITHOUT CONTRAST TECHNIQUE: Multidetector CT imaging of the abdomen and pelvis was performed using the standard protocol during bolus administration of intravenous contrast. Multiplanar reconstructed images and MIPs were obtained and reviewed to evaluate the vascular anatomy. RADIATION DOSE REDUCTION: This exam was performed according to the departmental dose-optimization program which includes automated exposure control, adjustment of the mA and/or kV according to patient size and/or use of iterative reconstruction technique. CONTRAST:  81m OMNIPAQUE IOHEXOL 350 MG/ML SOLN COMPARISON:  01/27/2023 FINDINGS: VASCULAR Aorta: Aortoiliac  atherosclerosis without acute vascular process. No aneurysm or dissection. No occlusive disease. Celiac: Widely patent including its branches SMA: Widely patent including its branches Renals: Main renal arteries and accessory right lower pole renal artery are all patent. IMA: Occluded origin but quickly reconstituted via SMA collateral pathways. Inflow: Pelvic iliac arterial vasculature remain patent. Scattered nonocclusive atherosclerotic change. No inflow disease. Proximal Outflow: The common femoral, proximal profunda femoral, and proximal superficial femoral arteries demonstrated are all patent. Minor atherosclerotic change. Veins: Interval removal of the IVC filter in the infrarenal IVC with stent reconstruction of the IVC from the renal veins inferiorly to the IVC bifurcation. IVC stent is patent. Previous left common iliac vein stent has some intraluminal irregularities but appears grossly patent. The pelvic iliac veins also enhance and appear grossly patent. No recurrent significant occlusive thromboembolic process identified. Hepatic, portal, splenic, renal, mesenteric veins  all appear patent. Lower esophageal varices noted as before related to portal hypertension and hepatic cirrhosis. Diffuse large right retroperitoneal mixed attenuation hematoma present, dominant component measures 7.5 x 9.6 x 10.9 cm. Within the hematoma, there is a trace amount of faint contrast extravasation, image 49/11 predominately present on the portal venous phase and delayed imaging more compatible with a venous bleed. The hematoma does displace the right kidney anteriorly. No brisk hyperdense acute arterial bleed demonstrated. Review of the MIP images confirms the above findings. NON-VASCULAR Lower chest: Trace pleural effusions and dependent bibasilar atelectasis. Several radiodense ingested pills noted in the lower esophagus. Stable heart size. No pericardial effusion. Metallic filter fragments noted in the left lower lung  vasculature, image 1/11. Tiny right lower lobe peripheral segmental pulmonary emboli, image 14/5, expected finding after the complex venous intervention. Hepatobiliary: Mild hepatomegaly with nodular hepatic surface compatible with hepatic cirrhosis. No large focal hepatic abnormality. Recanalized paraumbilical vein noted as well as esophageal varices compatible with portal hypertension. Portal vein appears decompressed through the periumbilical to inferior epigastric collateral pathway. No biliary dilatation or obstruction pattern. Small calcified layering gallstones. Gallbladder nondistended. Common bile duct nondilated. Pancreas: Unremarkable. No pancreatic ductal dilatation or surrounding inflammatory changes. Spleen: No focal splenic abnormality. Mild splenic enlargement related to the hepatic disease. Accessory splenule noted. Adrenals/Urinary Tract: No adrenal abnormality. Strandy right retroperitoneal hemorrhage about the right adrenal gland and right kidney also noted. Nonobstructing subcentimeter right nephrolithiasis present in the upper pole. No hydronephrosis, obstructive uropathy, hydroureter or ureteral calculus appreciated. There are a few scattered subcentimeter hypodense renal cysts noted. No further imaging recommended. Bladder unremarkable. Stomach/Bowel: Negative for bowel obstruction, significant dilatation, ileus, or free air. Normal appearing appendix. Trace retroperitoneal edema and hemorrhage about the third portion the duodenum again related to the complex IVC vascular intervention. Lymphatic: No bulky adenopathy. Reproductive: Probable small left fundal uterine fibroid. No adnexal abnormality. Trace pelvic free fluid as well as presacral retroperitoneal strandy edema, again related to the complex venous intervention. Other: Intact abdominal wall. No ventral hernia. Diffuse subcutaneous body edema compatible with anasarca. Musculoskeletal: No acute or significant osseous findings.  IMPRESSION: 1. Large right retroperitoneal hematoma with a trace amount of central contrast extravasation predominately present on the portal venous phase and delayed imaging more compatible with a venous bleed. 2. Interval removal of the IVC filter with stent reconstruction of the IVC. IVC stent is patent. Previous left common iliac vein stent has some intraluminal irregularities but appears grossly patent. 3. No other acute intra-abdominal or pelvic vascular finding. 4. Tiny incidental right lower lobe peripheral segmental pulmonary emboli, expected finding after the complex venous intervention. 1. Non-vascular: 2. Hepatic cirrhosis with evidence of portal hypertension and esophageal varices. 3. Trace pleural effusions and bibasilar atelectasis. 4. Cholelithiasis. 5. Nonobstructing right nephrolithiasis. 6. Diffuse subcutaneous body edema compatible with anasarca. 7. Aortic atherosclerosis. Aortic Atherosclerosis (ICD10-I70.0). These results were called by telephone at the time of interpretation on 02/03/2023 at 12:00 pm to provider Dr. Trula Slade, who verbally acknowledged these results. Electronically Signed   By: Jerilynn Mages.  Shick M.D.   On: 02/03/2023 12:16   DG Abd Portable 1V  Result Date: 01/30/2023 CLINICAL DATA:  [id number] Foreign body (FB) in soft tissue [id number] EXAM: PORTABLE ABDOMEN - 1 VIEW COMPARISON:  None Available. FINDINGS: Stent graft is seen in the aorta and extending into the left iliac artery. No unexpected radiopaque foreign body. Nonobstructive bowel gas pattern. IMPRESSION: Status post stent graft.  No unexpected radiopaque foreign body. These results were  called by telephone at the time of interpretation on 01/30/2023 at 10:11 pm to the [place], who verbally acknowledged these results. Electronically Signed   By: Rolm Baptise M.D.   On: 01/30/2023 22:11   DG Chest Port 1 View  Result Date: 01/30/2023 CLINICAL DATA:  [id number] Foreign body (FB) in soft tissue [id number] EXAM: PORTABLE CHEST 1 VIEW  COMPARISON:  07/13/2021 FINDINGS: Endotracheal tube is 2 cm above the carina. Heart is normal size. Mediastinal contours within normal limits. No confluent airspace opacities or effusions. Curved metallic density projects over the left chest along the left heart border. Exact anterior to posterior location cannot be determined on this single AP view. IMPRESSION: Curved metallic density projects along the left heart border concerning for foreign body. These results were called by telephone at the time of interpretation on 01/30/2023 at 10:10 pm to the [place], who verbally acknowledged these results. Electronically Signed   By: Rolm Baptise M.D.   On: 01/30/2023 22:10   HYBRID OR IMAGING ([facility] ONLY)  Result Date: 01/30/2023 There is no interpretation for this exam.  This order is for images obtained during a surgical procedure.  Please See "Surgeries" Tab for more information regarding the procedure.   VAS [place] LOWER EXTREMITY VENOUS (DVT) (7a-7p)  Result Date: 01/29/2023  Lower Venous DVT Study Patient Name:  Shelby Conner  Date of Exam:   01/27/2023 Medical Rec #: [id number]                Accession #:    [id number] Date of Birth: 06/04/1971                 Patient Gender: F Patient Age:   52 years Exam Location:  [facility] Procedure:      VAS [place] LOWER EXTREMITY VENOUS (DVT) Referring Phys: Charmaine Downs --------------------------------------------------------------------------------  Indications: Pain, and Swelling. Patient has IVC filter per CT done 11/14/20  Risk Factors: Personal history of DVT. Patient with Cirrhosis Anticoagulation: Coumadin. Patient taken off Warfarin 5 days for dental procedure. Comparison Study: No prior study on file Performing Technologist: Sharion Dove RVS  Examination Guidelines: A complete evaluation includes B-mode imaging, spectral Doppler, color Doppler, and power Doppler as needed of all accessible portions of each vessel. Bilateral testing is considered an  integral part of a complete examination. Limited examinations for reoccurring indications may be performed as noted. The reflux portion of the exam is performed with the patient in reverse Trendelenburg.  +---------+---------------+---------+-----------+----------+-------------------+ RIGHT    CompressibilityPhasicitySpontaneityPropertiesThrombus Aging      +---------+---------------+---------+-----------+----------+-------------------+ CFV      None           No       No                   Acute               +---------+---------------+---------+-----------+----------+-------------------+ SFJ      None                                         Acute               +---------+---------------+---------+-----------+----------+-------------------+ FV Prox  None           No       No  Acute               +---------+---------------+---------+-----------+----------+-------------------+ FV Mid   None           No       No                   Acute               +---------+---------------+---------+-----------+----------+-------------------+ FV DistalNone           No       No                   Acute               +---------+---------------+---------+-----------+----------+-------------------+ PFV      Partial        No       Yes                  Acute               +---------+---------------+---------+-----------+----------+-------------------+ POP      None           No       No                   Acute               +---------+---------------+---------+-----------+----------+-------------------+ PTV      None                                         Acute prox to mid                                                         portion             +---------+---------------+---------+-----------+----------+-------------------+ PERO     None                                         Acute prox to mid                                                          portion             +---------+---------------+---------+-----------+----------+-------------------+ EIV      None           No       No                   Acute               +---------+---------------+---------+-----------+----------+-------------------+ CIV                     No       No                   acute               +---------+---------------+---------+-----------+----------+-------------------+  Right Technical Findings: Not visualized segments include Unable to visualize IVC.  +----+---------------+---------+-----------+----------+-----------------------+ LEFTCompressibilityPhasicitySpontaneityPropertiesThrombus Aging          +----+---------------+---------+-----------+----------+-----------------------+ CFV Full           Yes      Yes                                          +----+---------------+---------+-----------+----------+-----------------------+ EIV                                              Acute                   +----+---------------+---------+-----------+----------+-----------------------+ CIV                                              Acute in distal portion +----+---------------+---------+-----------+----------+-----------------------+   Left Technical Findings: Not visualized segments include Unable to visualize proximal to mid CIV, IVC.   Summary: RIGHT: - Findings consistent with acute deep vein thrombosis involving the right common femoral vein, SF junction, right femoral vein, right proximal profunda vein, right popliteal vein, right posterior tibial veins, right peroneal veins, and EIV, CIV. - A cystic structure is found in the popliteal fossa.  LEFT: - Findings consistent with acute deep vein thrombosis involving the EIV, distal CIV.  *See table(s) above for measurements and observations. Electronically signed by Servando Snare MD on 01/29/2023 at 2:41:32 PM.    Final    HYBRID OR IMAGING ([place])  Result Date:  01/29/2023 There is no interpretation for this exam.  This order is for images obtained during a surgical procedure.  Please See "Surgeries" Tab for more information regarding the procedure.   CT ABDOMEN PELVIS W CONTRAST  Result Date: 01/27/2023 CLINICAL DATA:  Proximal DVT EXAM: CT ABDOMEN AND PELVIS WITH CONTRAST TECHNIQUE: Multidetector CT imaging of the abdomen and pelvis was performed using the standard protocol following bolus administration of intravenous contrast. RADIATION DOSE REDUCTION: This exam was performed according to the departmental dose-optimization program which includes automated exposure control, adjustment of the mA and/or kV according to patient size and/or use of iterative reconstruction technique. CONTRAST:  37m OMNIPAQUE IOHEXOL 350 MG/ML SOLN COMPARISON:  11/14/2020 FINDINGS: Lower chest: Mild dependent changes in the lung bases. Prominent esophageal varices. Hepatobiliary: Cirrhotic changes in the liver with enlarged lateral segment left lobe and caudate lobe and with nodular contour to the liver. There is somewhat poorly defined low-attenuation change in the anterior liver. The previous study demonstrated severe fatty infiltration throughout the liver which has improved since that time. It is likely that the low-attenuation changes reflect some residual heterogeneous fatty infiltration. No specific focal nodules are demonstrated. Cholelithiasis with small stones in the gallbladder. No gallbladder wall thickening or stranding. No bile duct dilatation. The portal veins are patent with prominent portal venous collaterals including periumbilical vein varices and splenic vein varices. Pancreas: Unremarkable. No pancreatic ductal dilatation or surrounding inflammatory changes. Spleen: Spleen size is upper limits of normal.  No focal lesions. Adrenals/Urinary Tract: Adrenal glands are unremarkable. Kidneys are normal, without renal calculi, focal lesion, or hydronephrosis. Bladder is  unremarkable. Stomach/Bowel: Stomach, small  bowel, and colon are not abnormally distended. No wall thickening or inflammatory changes. Appendix is normal. Vascular/Lymphatic: Diffuse aortic calcification. No aneurysm. An inferior vena caval filter is present. Stent in the left common iliac vein. No flow is demonstrated in the visualized inferior vena cava below the stent or in the iliac and external iliac veins with mild stranding around the external iliac veins. This likely indicates venous thrombosis. 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 Reproductive: Uterus and bilateral adnexa are unremarkable. Other: Infiltration in the subcutaneous fat around the right hip and right iliopsoas region likely representing edema although infection could also have this appearance. No loculated collections. No free air or free fluid in the abdomen. Musculoskeletal: No acute or significant osseous findings. IMPRESSION: 1. Hepatic cirrhosis with portal venous hypertension including multiple upper abdominal and esophageal varices as well as mild splenic enlargement. 2. Probable heterogeneous fatty infiltration of the liver. Fatty infiltration has improved since the previous study. 3. Cholelithiasis without CT evidence of acute cholecystitis. 4. Inferior vena caval filter is in place with probable venous thrombosis below this level and extending into the pelvic veins. Infiltration in the right iliopsoas region and soft tissues around the right hip likely indicate edema although infection could also have this appearance. 5. Aortic atherosclerosis. Electronically Signed   By: Lucienne Capers M.D.   On: 01/27/2023 18:43    Labs: BNP (last 3 results) No results for input(s): "BNP" in the last 8760 hours. Basic Metabolic Panel: Recent Labs  Lab 02/02/23 0325 02/03/23 0457 02/04/23 0127 02/05/23 0141 02/05/23 2314  NA 135 137 133* 134* 133*  K 3.6 3.5 4.2 4.4 4.4  CL 103  104 101 101 97*  CO2 23 25 24 $ 21* 27  GLUCOSE 172* 139* 113* 100* 130*  BUN <5* <5* <5* <5* 6  CREATININE 0.54 0.65 0.76 0.73 0.93  CALCIUM 7.5* 7.7* 8.1* 8.7* 9.0   Liver Function Tests: No results for input(s): "AST", "ALT", "ALKPHOS", "BILITOT", "PROT", "ALBUMIN" in the last 168 hours. No results for input(s): "LIPASE", "AMYLASE" in the last 168 hours. No results for input(s): "AMMONIA" in the last 168 hours. CBC: Recent Labs  Lab 02/05/23 0141 02/05/23 1209 02/05/23 2314 02/06/23 0547 02/06/23 1634 02/07/23 0056 02/08/23 0218  WBC 12.1*  --  11.0* 8.9  --  8.8 7.7  HGB 9.1*   < > 8.6* 8.3* 8.4* 8.3* 8.9*  HCT 27.5*   < > 26.8* 26.1* 26.0* 25.5* 26.8*  MCV 95.2  --  98.2 98.5  --  97.7 98.5  PLT 164  --  187 174  --  201 218   < > = values in this interval not displayed.   Cardiac Enzymes: No results for input(s): "CKTOTAL", "CKMB", "CKMBINDEX", "TROPONINI" in the last 168 hours. BNP: Invalid input(s): "POCBNP" CBG: Recent Labs  Lab 02/07/23 1138 02/07/23 1558 02/07/23 2141 02/08/23 0226 02/08/23 0600  GLUCAP 135* 105* 113* 105* 135*       Component Value Date/Time   COLORURINE STRAW (A) 07/24/2022 2340   APPEARANCEUR CLEAR 07/24/2022 2340   LABSPEC 1.008 07/24/2022 2340   PHURINE 6.0 07/24/2022 2340   GLUCOSEU >=500 (A) 07/24/2022 2340   HGBUR SMALL (A) 07/24/2022 2340   BILIRUBINUR NEGATIVE 07/24/2022 2340   KETONESUR NEGATIVE 07/24/2022 2340   PROTEINUR NEGATIVE 07/24/2022 2340   NITRITE NEGATIVE 07/24/2022 2340   LEUKOCYTESUR NEGATIVE 07/24/2022 2340   Sepsis Labs Recent Labs  Lab 02/05/23 2314 02/06/23 0547 02/07/23 0056  02/08/23 0218  WBC 11.0* 8.9 8.8 7.7   Microbiology No results found for this or any previous visit (from the past 240 hour(s)). T ime coordinating discharge: 35 minutes  SIGNED: Antonieta Pert, MD  [facility] 02/08/2023, 11:34 AM  If 7PM-7AM, please contact night-coverage [email or web address]

## 2023-02-08 NOTE — TOC Transition Note (Signed)
Transition of Care (TOC) - CM/SW Discharge Note Marvetta Gibbons RN, BSN Transitions of Care Unit 4E- RN Case Manager See Treatment Team for direct phone #   Patient Details  Name: Zarin Ringel MRN: [id number] Date of Birth: 11-14-1971  Transition of Care [facility]) CM/SW Contact:  Dawayne Patricia, RN Phone Number: 02/08/2023, 11:48 AM   Clinical Narrative:    Pt stable today for transition home s/p percutaneous pharmacomechanical thrombectomy with filter removal. IVC stent placement. Meds have been sent to [facility] to fill prior to discharge.   Transition of Care Department [facility]) has reviewed patient and no TOC needs have been identified, pt has transportation home and will follow up as per AVS instructions.   Final next level of care: Home/Self Care Barriers to Discharge: No Barriers Identified   Patient Goals and CMS Choice   Choice offered to / list presented to : NA  Discharge Placement                 Home        Discharge Plan and Services Additional resources added to the After Visit Summary for                                       Social Determinants of Health (SDOH) Interventions SDOH Screenings   Food Insecurity: No Food Insecurity (02/04/2023)  Housing: Low Risk  (02/04/2023)  Transportation Needs: No Transportation Needs (02/04/2023)  Utilities: Not At Risk (02/04/2023)  Tobacco Use: Medium Risk (02/01/2023)     Readmission Risk Interventions    02/08/2023   11:48 AM 12/11/2020   12:37 PM  Readmission Risk Prevention Plan  Post Dischage Appt Complete   Medication Screening Complete   Transportation Screening Complete Complete  HRI or Home Care Consult  Complete  Social Work Consult for [place] Planning/Counseling  Complete  Medication Review Press photographer)  Complete

## 2023-02-08 NOTE — Progress Notes (Addendum)
[facility] [place]  Subjective  - Ready to go home   Objective (!) 98/53 62 98.1 F (36.7 C) (Oral) 20 94% No intake or output data in the 24 hours ending 02/08/23 0655  Feet warm well perfused Right groin healing well Abdomin soft Lungs non labored breathing  Assessment/Planning: 52 y.o. female is s/p percutaneous pharmacomechanical thrombectomy with filter removal. IVC stent placement   9 Days Post-Op Developed RP hematoma 4 Days ago. Stable   Plan for anticoagulation per primary team and PCP  F/U with [facility] will be arranged Compression to be worn daily. Exercise daily and elevate LE's when at rest No recommendation for PT/OT follow up ready from discharge from a vascular point of view.    Roxy Horseman 02/08/2023 6:55 AM -- VASCULAR STAFF ADDENDUM: I have independently interviewed and examined the patient. I agree with the above.  Home today  Cassandria Santee, MD [facility] Phone Number: [phone] 02/08/2023 9:50 AM    Laboratory Lab Results: Recent Labs    02/07/23 0056 02/08/23 0218  WBC 8.8 7.7  HGB 8.3* 8.9*  HCT 25.5* 26.8*  PLT 201 218   BMET Recent Labs    02/05/23 2314  NA 133*  K 4.4  CL 97*  CO2 27  GLUCOSE 130*  BUN 6  CREATININE 0.93  CALCIUM 9.0    COAG Lab Results  Component Value Date   INR 1.5 (H) 02/03/2023   INR 1.9 (H) 02/02/2023   INR 2.7 (H) 02/01/2023   No results found for: "PTT"

## 2023-02-09 ENCOUNTER — Telehealth: Payer: Self-pay | Admitting: Physician Assistant

## 2023-02-09 NOTE — Telephone Encounter (Signed)
-----   Message from Ulyses Amor, [place] sent at 02/07/2023  7:54 AM EST ----- F/U with Dr. Virl Cagey s/p thrombectomy and removal of IVC filter needs IVC iliac venous duplex and right LE DVT study at 4 week follow up

## 2023-02-27 ENCOUNTER — Other Ambulatory Visit: Payer: Self-pay

## 2023-02-27 DIAGNOSIS — I82491 Acute embolism and thrombosis of other specified deep vein of right lower extremity: Secondary | ICD-10-CM

## 2023-02-27 DIAGNOSIS — I82220 Acute embolism and thrombosis of inferior vena cava: Secondary | ICD-10-CM

## 2023-03-07 ENCOUNTER — Inpatient Hospital Stay: Payer: BC Managed Care – PPO | Attending: Hematology | Admitting: Hematology

## 2023-03-16 ENCOUNTER — Encounter: Payer: BC Managed Care – PPO | Admitting: Vascular Surgery

## 2023-03-16 ENCOUNTER — Ambulatory Visit (HOSPITAL_COMMUNITY): Payer: BC Managed Care – PPO

## 2023-04-06 ENCOUNTER — Ambulatory Visit (HOSPITAL_COMMUNITY): Payer: BC Managed Care – PPO

## 2023-04-06 ENCOUNTER — Ambulatory Visit (HOSPITAL_COMMUNITY): Payer: BC Managed Care – PPO | Attending: Vascular Surgery

## 2023-04-06 ENCOUNTER — Encounter: Payer: BC Managed Care – PPO | Admitting: Vascular Surgery

## 2023-05-25 ENCOUNTER — Other Ambulatory Visit: Payer: Self-pay | Admitting: Vascular Surgery

## 2023-05-25 DIAGNOSIS — I82220 Acute embolism and thrombosis of inferior vena cava: Secondary | ICD-10-CM

## 2023-05-25 DIAGNOSIS — I82491 Acute embolism and thrombosis of other specified deep vein of right lower extremity: Secondary | ICD-10-CM

## 2023-12-20 ENCOUNTER — Other Ambulatory Visit: Payer: Self-pay

## 2023-12-20 ENCOUNTER — Encounter (HOSPITAL_COMMUNITY): Payer: Self-pay | Admitting: Internal Medicine

## 2023-12-20 ENCOUNTER — Emergency Department (HOSPITAL_COMMUNITY): Payer: BC Managed Care – PPO

## 2023-12-20 ENCOUNTER — Inpatient Hospital Stay (HOSPITAL_COMMUNITY)
Admission: EM | Admit: 2023-12-20 | Discharge: 2023-12-24 | DRG: 299 | Disposition: A | Discharge status: Home / Self Care | Payer: BC Managed Care – PPO | Attending: Family Medicine | Admitting: Family Medicine

## 2023-12-20 DIAGNOSIS — Z95828 Presence of other vascular implants and grafts: Secondary | ICD-10-CM | POA: Diagnosis not present

## 2023-12-20 DIAGNOSIS — Z888 Allergy status to other drugs, medicaments and biological substances status: Secondary | ICD-10-CM

## 2023-12-20 DIAGNOSIS — I82491 Acute embolism and thrombosis of other specified deep vein of right lower extremity: Secondary | ICD-10-CM | POA: Diagnosis not present

## 2023-12-20 DIAGNOSIS — I82412 Acute embolism and thrombosis of left femoral vein: Secondary | ICD-10-CM | POA: Diagnosis present

## 2023-12-20 DIAGNOSIS — I82409 Acute embolism and thrombosis of unspecified deep veins of unspecified lower extremity: Secondary | ICD-10-CM | POA: Diagnosis present

## 2023-12-20 DIAGNOSIS — Z87891 Personal history of nicotine dependence: Secondary | ICD-10-CM | POA: Diagnosis not present

## 2023-12-20 DIAGNOSIS — Z809 Family history of malignant neoplasm, unspecified: Secondary | ICD-10-CM

## 2023-12-20 DIAGNOSIS — Z7901 Long term (current) use of anticoagulants: Secondary | ICD-10-CM

## 2023-12-20 DIAGNOSIS — D6859 Other primary thrombophilia: Secondary | ICD-10-CM | POA: Diagnosis present

## 2023-12-20 DIAGNOSIS — M79662 Pain in left lower leg: Secondary | ICD-10-CM | POA: Diagnosis not present

## 2023-12-20 DIAGNOSIS — Z794 Long term (current) use of insulin: Secondary | ICD-10-CM

## 2023-12-20 DIAGNOSIS — K766 Portal hypertension: Secondary | ICD-10-CM | POA: Diagnosis present

## 2023-12-20 DIAGNOSIS — E038 Other specified hypothyroidism: Secondary | ICD-10-CM | POA: Diagnosis present

## 2023-12-20 DIAGNOSIS — E871 Hypo-osmolality and hyponatremia: Secondary | ICD-10-CM | POA: Diagnosis present

## 2023-12-20 DIAGNOSIS — D539 Nutritional anemia, unspecified: Secondary | ICD-10-CM | POA: Diagnosis present

## 2023-12-20 DIAGNOSIS — D7589 Other specified diseases of blood and blood-forming organs: Secondary | ICD-10-CM | POA: Diagnosis present

## 2023-12-20 DIAGNOSIS — Z83438 Family history of other disorder of lipoprotein metabolism and other lipidemia: Secondary | ICD-10-CM

## 2023-12-20 DIAGNOSIS — Z79899 Other long term (current) drug therapy: Secondary | ICD-10-CM | POA: Diagnosis not present

## 2023-12-20 DIAGNOSIS — E785 Hyperlipidemia, unspecified: Secondary | ICD-10-CM | POA: Diagnosis present

## 2023-12-20 DIAGNOSIS — Z8261 Family history of arthritis: Secondary | ICD-10-CM

## 2023-12-20 DIAGNOSIS — Z8249 Family history of ischemic heart disease and other diseases of the circulatory system: Secondary | ICD-10-CM

## 2023-12-20 DIAGNOSIS — I2699 Other pulmonary embolism without acute cor pulmonale: Secondary | ICD-10-CM | POA: Diagnosis present

## 2023-12-20 DIAGNOSIS — I82512 Chronic embolism and thrombosis of left femoral vein: Secondary | ICD-10-CM | POA: Diagnosis present

## 2023-12-20 DIAGNOSIS — R161 Splenomegaly, not elsewhere classified: Secondary | ICD-10-CM | POA: Diagnosis present

## 2023-12-20 DIAGNOSIS — K7460 Unspecified cirrhosis of liver: Secondary | ICD-10-CM | POA: Diagnosis present

## 2023-12-20 DIAGNOSIS — E876 Hypokalemia: Secondary | ICD-10-CM | POA: Diagnosis present

## 2023-12-20 DIAGNOSIS — E119 Type 2 diabetes mellitus without complications: Secondary | ICD-10-CM | POA: Diagnosis present

## 2023-12-20 DIAGNOSIS — D696 Thrombocytopenia, unspecified: Secondary | ICD-10-CM | POA: Insufficient documentation

## 2023-12-20 DIAGNOSIS — I82562 Chronic embolism and thrombosis of left calf muscular vein: Secondary | ICD-10-CM | POA: Diagnosis present

## 2023-12-20 DIAGNOSIS — D75829 Heparin-induced thrombocytopenia, unspecified: Secondary | ICD-10-CM | POA: Diagnosis not present

## 2023-12-20 DIAGNOSIS — I700 Atherosclerosis of aorta: Secondary | ICD-10-CM | POA: Diagnosis present

## 2023-12-20 DIAGNOSIS — Z885 Allergy status to narcotic agent status: Secondary | ICD-10-CM

## 2023-12-20 DIAGNOSIS — Z833 Family history of diabetes mellitus: Secondary | ICD-10-CM

## 2023-12-20 DIAGNOSIS — I82812 Embolism and thrombosis of superficial veins of left lower extremities: Secondary | ICD-10-CM | POA: Diagnosis present

## 2023-12-20 DIAGNOSIS — K760 Fatty (change of) liver, not elsewhere classified: Secondary | ICD-10-CM | POA: Diagnosis present

## 2023-12-20 LAB — CBC WITH DIFFERENTIAL/PLATELET
Abs Immature Granulocytes: 0.02 10*3/uL (ref 0.00–0.07)
Basophils Absolute: 0.1 10*3/uL (ref 0.0–0.1)
Basophils Relative: 1 %
Eosinophils Absolute: 0.1 10*3/uL (ref 0.0–0.5)
Eosinophils Relative: 2 %
HCT: 49.5 % — ABNORMAL HIGH (ref 36.0–46.0)
Hemoglobin: 17 g/dL — ABNORMAL HIGH (ref 12.0–15.0)
Immature Granulocytes: 0 %
Lymphocytes Relative: 23 %
Lymphs Abs: 1.1 10*3/uL (ref 0.7–4.0)
MCH: 35.8 pg — ABNORMAL HIGH (ref 26.0–34.0)
MCHC: 34.3 g/dL (ref 30.0–36.0)
MCV: 104.2 fL — ABNORMAL HIGH (ref 80.0–100.0)
Monocytes Absolute: 0.3 10*3/uL (ref 0.1–1.0)
Monocytes Relative: 5 %
Neutro Abs: 3.3 10*3/uL (ref 1.7–7.7)
Neutrophils Relative %: 69 %
Platelets: 82 10*3/uL — ABNORMAL LOW (ref 150–400)
RBC: 4.75 MIL/uL (ref 3.87–5.11)
RDW: 15 % (ref 11.5–15.5)
WBC: 4.8 10*3/uL (ref 4.0–10.5)
nRBC: 0 % (ref 0.0–0.2)

## 2023-12-20 LAB — HEPATIC FUNCTION PANEL
ALT: 27 U/L (ref 0–44)
AST: 45 U/L — ABNORMAL HIGH (ref 15–41)
Albumin: 3.3 g/dL — ABNORMAL LOW (ref 3.5–5.0)
Alkaline Phosphatase: 121 U/L (ref 38–126)
Bilirubin, Direct: 0.4 mg/dL — ABNORMAL HIGH (ref 0.0–0.2)
Indirect Bilirubin: 1.5 mg/dL — ABNORMAL HIGH (ref 0.3–0.9)
Total Bilirubin: 1.9 mg/dL — ABNORMAL HIGH (ref <1.2)
Total Protein: 7.8 g/dL (ref 6.5–8.1)

## 2023-12-20 LAB — BASIC METABOLIC PANEL
Anion gap: 12 (ref 5–15)
BUN: 5 mg/dL — ABNORMAL LOW (ref 6–20)
CO2: 25 mmol/L (ref 22–32)
Calcium: 8.8 mg/dL — ABNORMAL LOW (ref 8.9–10.3)
Chloride: 97 mmol/L — ABNORMAL LOW (ref 98–111)
Creatinine, Ser: 0.75 mg/dL (ref 0.44–1.00)
GFR, Estimated: >60 mL/min (ref >60)
Glucose, Bld: 155 mg/dL — ABNORMAL HIGH (ref 70–99)
Potassium: 3.4 mmol/L — ABNORMAL LOW (ref 3.5–5.1)
Sodium: 134 mmol/L — ABNORMAL LOW (ref 135–145)

## 2023-12-20 LAB — VITAMIN B12: Vitamin B-12: 213 pg/mL (ref 180–914)

## 2023-12-20 LAB — PROTIME-INR
INR: 1.1 (ref 0.8–1.2)
Prothrombin Time: 14.9 s (ref 11.4–15.2)

## 2023-12-20 LAB — GLUCOSE, CAPILLARY: Glucose-Capillary: 130 mg/dL — ABNORMAL HIGH (ref 70–99)

## 2023-12-20 LAB — FOLATE: Folate: 10.1 ng/mL (ref >5.9)

## 2023-12-20 LAB — HEPARIN LEVEL (UNFRACTIONATED): Heparin Unfractionated: 0.66 [IU]/mL (ref 0.30–0.70)

## 2023-12-20 LAB — APTT: aPTT: 33 s (ref 24–36)

## 2023-12-20 LAB — TROPONIN I (HIGH SENSITIVITY)
Troponin I (High Sensitivity): 5 ng/L (ref <18)
Troponin I (High Sensitivity): 5 ng/L (ref <18)

## 2023-12-20 LAB — TSH: TSH: 8.063 u[IU]/mL — ABNORMAL HIGH (ref 0.350–4.500)

## 2023-12-20 LAB — MAGNESIUM: Magnesium: 1.8 mg/dL (ref 1.7–2.4)

## 2023-12-20 MED ORDER — MORPHINE SULFATE (PF) 4 MG/ML IV SOLN
4.0000 mg | Freq: Once | INTRAVENOUS | Status: AC
  Administered 2023-12-20: 4 mg via INTRAVENOUS
  Filled 2023-12-20: qty 1

## 2023-12-20 MED ORDER — POTASSIUM CHLORIDE 20 MEQ PO PACK
40.0000 meq | PACK | Freq: Two times a day (BID) | ORAL | Status: AC
  Administered 2023-12-20 – 2023-12-21 (×2): 40 meq via ORAL
  Filled 2023-12-20 (×2): qty 2

## 2023-12-20 MED ORDER — OXYCODONE HCL 5 MG PO TABS
5.0000 mg | ORAL_TABLET | ORAL | Status: DC | PRN
  Filled 2023-12-20: qty 1

## 2023-12-20 MED ORDER — DIPHENHYDRAMINE HCL 50 MG/ML IJ SOLN
25.0000 mg | INTRAMUSCULAR | Status: DC | PRN
  Administered 2023-12-21 – 2023-12-22 (×5): 25 mg via INTRAVENOUS
  Filled 2023-12-20 (×6): qty 1

## 2023-12-20 MED ORDER — INSULIN GLARGINE-YFGN 100 UNIT/ML ~~LOC~~ SOLN
20.0000 [IU] | Freq: Every day | SUBCUTANEOUS | Status: DC
  Administered 2023-12-20 – 2023-12-22 (×3): 20 [IU] via SUBCUTANEOUS
  Filled 2023-12-20 (×5): qty 0.2

## 2023-12-20 MED ORDER — INSULIN ASPART 100 UNIT/ML IJ SOLN
0.0000 [IU] | Freq: Three times a day (TID) | INTRAMUSCULAR | Status: DC
  Administered 2023-12-21 – 2023-12-23 (×5): 2 [IU] via SUBCUTANEOUS

## 2023-12-20 MED ORDER — SODIUM CHLORIDE 0.9% FLUSH
3.0000 mL | Freq: Two times a day (BID) | INTRAVENOUS | Status: DC
  Administered 2023-12-22 – 2023-12-23 (×2): 3 mL via INTRAVENOUS

## 2023-12-20 MED ORDER — IOHEXOL 350 MG/ML SOLN
75.0000 mL | Freq: Once | INTRAVENOUS | Status: AC | PRN
  Administered 2023-12-20: 75 mL via INTRAVENOUS

## 2023-12-20 MED ORDER — SODIUM CHLORIDE 0.9 % IV SOLN: 25.0000 mg | INTRAVENOUS | Status: DC | PRN

## 2023-12-20 MED ORDER — HYDROMORPHONE HCL 1 MG/ML IJ SOLN
0.5000 mg | INTRAMUSCULAR | Status: DC | PRN
  Administered 2023-12-21 – 2023-12-23 (×5): 0.5 mg via INTRAVENOUS
  Filled 2023-12-20 (×5): qty 0.5

## 2023-12-20 MED ORDER — OXYCODONE HCL 5 MG PO TABS
10.0000 mg | ORAL_TABLET | ORAL | Status: DC | PRN
  Administered 2023-12-20 – 2023-12-24 (×14): 10 mg via ORAL
  Filled 2023-12-20 (×14): qty 2

## 2023-12-20 MED ORDER — HEPARIN (PORCINE) 25000 UT/250ML-% IV SOLN: 1200.0000 [IU]/h | INTRAVENOUS | Status: DC

## 2023-12-20 MED ORDER — SODIUM CHLORIDE 0.9% FLUSH: 3.0000 mL | INTRAVENOUS | Status: DC | PRN

## 2023-12-20 MED ORDER — INSULIN ASPART 100 UNIT/ML IJ SOLN: 0.0000 [IU] | Freq: Every day | INTRAMUSCULAR | Status: DC

## 2023-12-20 MED ORDER — ARGATROBAN 50 MG/50ML IV SOLN
1.4000 ug/kg/min | INTRAVENOUS | Status: AC
  Administered 2023-12-20: 1 ug/kg/min via INTRAVENOUS
  Administered 2023-12-21: 1.3 ug/kg/min via INTRAVENOUS
  Administered 2023-12-22 (×2): 1.4 ug/kg/min via INTRAVENOUS
  Administered 2023-12-22: 1.35 ug/kg/min via INTRAVENOUS
  Administered 2023-12-23: 1.4 ug/kg/min via INTRAVENOUS
  Filled 2023-12-20 (×8): qty 50

## 2023-12-20 MED ORDER — SODIUM CHLORIDE 0.9 % IV SOLN: 250.0000 mL | INTRAVENOUS | Status: AC | PRN

## 2023-12-20 NOTE — Assessment & Plan Note (Addendum)
Glucose well controlled - Continue glargine - Continue sliding scale corrections

## 2023-12-20 NOTE — ED Notes (Signed)
ED TO INPATIENT HANDOFF REPORT  ED Nurse Name and Phone #:  Corliss Blacker, RN [phone]  S Name/Age/Gender Shelby Conner 52 y.o. female Room/Bed: 034C/034C  Code Status   Code Status: Full Code  Home/SNF/Other Home Patient oriented to: self, place, time, and situation Is this baseline? Yes   Triage Complete: Triage complete  Chief Complaint DVT (deep venous thrombosis) (HCC) [I82.409]  Triage Note Pt reports pain to her L calf that has recently traveled to her L groin area, consistent with previous DVTs. Pt has extensive hx of blood clots. Anticoagulated on Lovenox q12. Pt also endorses DOE.    Allergies No Known Allergies  Level of Care/Admitting Diagnosis ED Disposition     ED Disposition  Admit   Condition  --   Comment  Hospital Area: [facility] [[id number]]  Level of Care: Med-Surg [16]  May admit patient to [facility] or [facility] if equivalent level of care is available:: No  Covid Evaluation: Asymptomatic - no recent exposure (last 10 days) testing not required  Diagnosis: DVT (deep venous thrombosis) [facility]) [[id number]]  Admitting Physician: Princess Bruins [[id number]]  Attending Physician: Princess Bruins [[id number]]  Certification:: I certify this patient will need inpatient services for at least 2 midnights  Expected Medical Readiness: 12/25/2023          B Medical/Surgery History Past Medical History:  Diagnosis Date   Diabetes mellitus without complication (HCC)    Fatty liver    Hyperlipidemia    Protein C deficiency (HCC)    Protein S deficiency (HCC)    Past Surgical History:  Procedure Laterality Date   ANKLE SURGERY Right    BIOPSY  11/16/2020   Procedure: BIOPSY;  Surgeon: Kathi Der, MD;  Location: [facility] END[facility]COPY;  Service: Gastroenterology;;   CESAREAN SECTION     ESOPHAGOGASTRODUODENOSCOPY N/A 11/16/2020   Procedure: ESOPHAGOGASTRODUODENOSCOPY (EGD);  Surgeon: Kathi Der, MD;  Location: [facility] ENDOSCOPY;   Service: Gastroenterology;  Laterality: N/A;   INSERTION OF ILIAC STENT N/A 01/30/2023   Procedure: INSERTION OF InternalVena Cava Stent;  Surgeon: Maeola Harman, MD;  [facility] OR;  Service: Vascular;  Laterality: N/A;   IR PARACENTESIS  11/12/2020   KNEE SURGERY Left    PERCUTANEOUS VENOUS THROMBECTOMY,LYSIS WITH INTRAVASCULAR ULTRASOUND (IVUS) N/A 01/30/2023   Procedure: PERCUTANEOUS VENOUS THROMBECTOMY AND LYSIS WITH INTRAVASCULAR ULTRASOUND (IVUS);  Surgeon: Maeola Harman, MD;  Location: [facility] OR;  Service: Vascular;  Laterality: N/A;   VENOGRAM N/A 01/30/2023   Procedure: [name];  Surgeon: Maeola Harman, MD;  Location: [facility] OR;  Service: Vascular;  Laterality: N/A;     A IV Location/Drains/Wounds Patient Lines/Drains/Airways Status     Active Line/Drains/Airways     Name Placement date Placement time Site Days   Peripheral IV 12/20/23 20 G 1.88" Anterior;Left Forearm 12/20/23  1616  Forearm  less than 1            Intake/Output Last 24 hours No intake or output data in the 24 hours ending 12/20/23 2001  Labs/Imaging Results for orders placed or performed during the hospital encounter of 12/20/23 (from the past 48 hours)  CBC with Differential     Status: Abnormal   Collection Time: 12/20/23 12:31 PM  Result Value Ref Range   WBC 4.8 4.0 - 10.5 K/uL   RBC 4.75 3.87 - 5.11 MIL/uL   Hemoglobin 17.0 (H) 12.0 - 15.0 g/dL   HCT 18.8 (H) 41.6 - 60.6 %   MCV 104.2 (H) 80.0 -  100.0 fL   MCH 35.8 (H) 26.0 - 34.0 pg   MCHC 34.3 30.0 - 36.0 g/dL   RDW 29.5 28.4 - 13.2 %   Platelets 82 (L) 150 - 400 K/uL    Comment: Immature Platelet Fraction may be clinically indicated, consider ordering this additional test [id number] REPEATED TO VERIFY    nRBC 0.0 0.0 - 0.2 %   Neutrophils Relative % 69 %   Neutro Abs 3.3 1.7 - 7.7 K/uL   Lymphocytes Relative 23 %   Lymphs Abs 1.1 0.7 - 4.0 K/uL   Monocytes Relative 5 %   Monocytes Absolute 0.3 0.1 - 1.0  K/uL   Eosinophils Relative 2 %   Eosinophils Absolute 0.1 0.0 - 0.5 K/uL   Basophils Relative 1 %   Basophils Absolute 0.1 0.0 - 0.1 K/uL   Immature Granulocytes 0 %   Abs Immature Granulocytes 0.02 0.00 - 0.07 K/uL    Comment: Performed at [facility], [address] [place], [place] [address]  Basic metabolic panel     Status: Abnormal   Collection Time: 12/20/23 12:31 PM  Result Value Ref Range   Sodium 134 (L) 135 - 145 mmol/L   Potassium 3.4 (L) 3.5 - 5.1 mmol/L   Chloride 97 (L) 98 - 111 mmol/L   CO2 25 22 - 32 mmol/L   Glucose, Bld 155 (H) 70 - 99 mg/dL    Comment: Glucose reference range applies only to samples taken after fasting for at least 8 hours.   BUN 5 (L) 6 - 20 mg/dL   Creatinine, Ser 4.03 0.44 - 1.00 mg/dL   Calcium 8.8 (L) 8.9 - 10.3 mg/dL   GFR, Estimated >47 >42 mL/min    Comment: (NOTE) Calculated using the CKD-EPI Creatinine Equation (2021)    Anion gap 12 5 - 15    Comment: Performed at [facility], [address], [place], [place] [address]  Troponin I (High Sensitivity)     Status: None   Collection Time: 12/20/23 12:31 PM  Result Value Ref Range   Troponin I (High Sensitivity) 5 <18 ng/L    Comment: (NOTE) Elevated high sensitivity troponin I (hsTnI) values and significant  changes across serial measurements may suggest ACS but many other  chronic and acute conditions are known to elevate hsTnI results.  Refer to the "Links" section for chest pain algorithms and additional  guidance. Performed at [facility], [address] [place], [place] [address]   APTT     Status: None   Collection Time: 12/20/23  4:13 PM  Result Value Ref Range   aPTT 33 24 - 36 seconds    Comment: Performed at [facility], [address], [place], [place] [address]  Heparin level (unfractionated)     Status: None   Collection Time: 12/20/23  4:13 PM  Result Value Ref Range   Heparin Unfractionated 0.66 0.30 - 0.70 IU/mL    Comment:  (NOTE) The clinical reportable range upper limit is being lowered to >1.10 to align with the FDA approved guidance for the current laboratory assay.  If heparin results are below expected values, and patient dosage has  been confirmed, suggest follow up testing of antithrombin III levels. Performed at [facility], [address] [place], [place] [address]   Protime-INR     Status: None   Collection Time: 12/20/23  4:13 PM  Result Value Ref Range   Prothrombin Time 14.9 11.4 -  15.2 seconds   INR 1.1 0.8 - 1.2    Comment: (NOTE) INR goal varies based on device and disease states. Performed at [facility], [address] [place], [place] [address]   Troponin I (High Sensitivity)     Status: None   Collection Time: 12/20/23  4:57 PM  Result Value Ref Range   Troponin I (High Sensitivity) 5 <18 ng/L    Comment: (NOTE) Elevated high sensitivity troponin I (hsTnI) values and significant  changes across serial measurements may suggest ACS but many other  chronic and acute conditions are known to elevate hsTnI results.  Refer to the "Links" section for chest pain algorithms and additional  guidance. Performed at [facility], [address], [place], [place] [address]    CT Angio Chest PE W/Cm &/Or Wo Cm Result Date: 12/20/2023 CLINICAL DATA:  Concern for pulmonary embolism. EXAM: CT ANGIOGRAPHY CHEST WITH CONTRAST TECHNIQUE: Multidetector CT imaging of the chest was performed using the standard protocol during bolus administration of intravenous contrast. Multiplanar CT image reconstructions and MIPs were obtained to evaluate the vascular anatomy. RADIATION DOSE REDUCTION: This exam was performed according to the departmental dose-optimization program which includes automated exposure control, adjustment of the mA and/or kV according to patient size and/or use of iterative reconstruction technique. CONTRAST:  75mL OMNIPAQUE IOHEXOL 350 MG/ML SOLN COMPARISON:  Chest  radiograph dated 01/30/2023. FINDINGS: Cardiovascular: There is no cardiomegaly or pericardial effusion. There is mild atherosclerotic calcification of the thoracic aorta. No aneurysmal dilatation or dissection. The origins of the great vessels of the aortic arch appear patent. Bilateral lower lobe segmental pulmonary artery emboli. No evidence of right heart straining. Mediastinum/Nodes: No hilar or mediastinal adenopathy. Small hiatal hernia. The esophagus is grossly unremarkable. No mediastinal fluid collection. Lungs/Pleura: No focal consolidation, pleural effusion, pneumothorax. The central airways are patent. Upper Abdomen: Fatty liver with changes of cirrhosis. Past soft Jewel varices. Musculoskeletal: No acute osseous pathology. Review of the MIP images confirms the above findings. IMPRESSION: 1. Bilateral lower lobe segmental pulmonary artery emboli. No evidence of right heart straining. 2. No focal consolidation, pleural effusion, or pneumothorax. 3. Fatty liver with changes of cirrhosis. 4.  Aortic Atherosclerosis (ICD10-I70.0). These results were called by telephone at the time of interpretation on 12/20/2023 at 5:21 pm to Dr. Lynelle Doctor, who verbally acknowledged these results. Electronically Signed   By: Elgie Collard M.D.   On: 12/20/2023 17:29   VAS [place] LOWER EXTREMITY VENOUS (DVT) (7a-7p) Result Date: 12/20/2023  Lower Venous DVT Study Patient Name:  Shelby Conner  Date of Exam:   12/20/2023 Medical Rec #: [id number]                Accession #:    [id number] Date of Birth: March 07, 1971                 Patient Gender: F Patient Age:   82 years Exam Location:  [facility] Procedure:      VAS [place] LOWER EXTREMITY VENOUS (DVT) Referring Phys: [facility] MEREDITH --------------------------------------------------------------------------------  Indications: Pain.  Risk Factors: Protein C deficiency and protein S deficiency DVT 01/27/23 extensive right from Distal CIV through to the PT and Peroneal  veins and left EIV and distal CIV Patient had thrombectomy 01/29/23 and IVC filter removed 01/30/2023. Anticoagulation: Lovenox. Limitations: Poor ultrasound/tissue interface and bowel gas. Comparison Study: Prior study done 01/27/23, no showed to follow up appointment  04/06/23 Performing Technologist: Sherren Kerns RVS  Examination Guidelines: A complete evaluation includes B-mode imaging, spectral Doppler, color Doppler, and power Doppler as needed of all accessible portions of each vessel. Bilateral testing is considered an integral part of a complete examination. Limited examinations for reoccurring indications may be performed as noted. The reflux portion of the exam is performed with the patient in reverse Trendelenburg.  +-----+---------------+---------+-----------+----------+--------------+ RIGHTCompressibilityPhasicitySpontaneityPropertiesThrombus Aging +-----+---------------+---------+-----------+----------+--------------+ CFV  Full           Yes      No                                  +-----+---------------+---------+-----------+----------+--------------+ SFJ  Full                                                        +-----+---------------+---------+-----------+----------+--------------+ EIV                                               Not visualized +-----+---------------+---------+-----------+----------+--------------+   +---------+---------------+---------+-----------+----------+-------------------+ LEFT     CompressibilityPhasicitySpontaneityPropertiesThrombus Aging      +---------+---------------+---------+-----------+----------+-------------------+ CFV      Partial        No       No                   acute in distal                                                           portion             +---------+---------------+---------+-----------+----------+-------------------+ SFJ      Full           Yes      No                                        +---------+---------------+---------+-----------+----------+-------------------+ FV Prox  Partial        No       No                   Chronic             +---------+---------------+---------+-----------+----------+-------------------+ FV Mid   Partial                                      Chronic             +---------+---------------+---------+-----------+----------+-------------------+ FV DistalPartial                                                          +---------+---------------+---------+-----------+----------+-------------------+ PFV  Partial                                      Chronic             +---------+---------------+---------+-----------+----------+-------------------+ POP      Partial        No       No                   Chronic             +---------+---------------+---------+-----------+----------+-------------------+ PTV      Partial                                      Chronic             +---------+---------------+---------+-----------+----------+-------------------+ PERO     Partial                                      Chronic             +---------+---------------+---------+-----------+----------+-------------------+ Gastroc  Partial        No       No                   Chronic             +---------+---------------+---------+-----------+----------+-------------------+ SSV      None                                         acute from origin                                                         to mid calf         +---------+---------------+---------+-----------+----------+-------------------+ EIV                     No       No                   patent by color     +---------+---------------+---------+-----------+----------+-------------------+ CIV                                                   Not visualized       +---------+---------------+---------+-----------+----------+-------------------+     Summary: RIGHT: - No evidence of common femoral vein obstruction.   LEFT: - Findings consistent with acute deep vein thrombosis involving the Distal common femoral. - Findings consistent with acute superficial vein thrombosis involving the left small saphenous vein from the origin to the mid calf.  - Findings consistent with chronic deep vein thrombosis involving the left femoral vein, left proximal profunda vein, left popliteal vein, left posterior tibial veins, and left peroneal veins. 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  *See table(s) above for  measurements and observations. Electronically signed by Gerarda Fraction on 12/20/2023 at 4:21:48 PM.    Final     Pending Labs Unresulted Labs (From admission, onward)     Start     Ordered   12/21/23 0500  CBC  Tomorrow morning,   R       Question:  Specimen collection method  Answer:  IV Team=IV Team collect   12/20/23 1918   12/21/23 0500  Comprehensive metabolic panel  Tomorrow morning,   R       Question:  Specimen collection method  Answer:  IV Team=IV Team collect   12/20/23 1953   12/20/23 1951  Magnesium  Once,   R       Question:  Specimen collection method  Answer:  IV Team=IV Team collect   12/20/23 1953   12/20/23 1950  Vitamin B12  Once,   R       Question:  Specimen collection method  Answer:  IV Team=IV Team collect   12/20/23 1949   12/20/23 1950  Folate  Once,   R       Question:  Specimen collection method  Answer:  IV Team=IV Team collect   12/20/23 1949   12/20/23 1950  TSH  Once,   R       Question:  Specimen collection method  Answer:  IV Team=IV Team collect   12/20/23 1949   12/20/23 1943  Hepatic function panel  ONCE - STAT,   STAT       Question:  Specimen collection method  Answer:  IV Team=IV Team collect   12/20/23 1942   12/20/23 1916  Hemoglobin A1c  Once,   URGENT        Comments: To assess prior glycemic control   Question:  Specimen collection method  Answer:  IV Team=IV Team collect   12/20/23 1916   12/20/23 1913  Heparin induced platelet Ab (HIT antibody)  Once,   URGENT       Question:  Specimen collection method  Answer:  IV Team=IV Team collect   12/20/23 1913            Vitals/Pain Today's Vitals   12/20/23 1630 12/20/23 1708 12/20/23 1730 12/20/23 1735  BP: 118/76  119/85 (!) 137/94  Pulse: 70  71 74  Resp: 14  10 14   Temp:  98.4 F (36.9 C)    TempSrc:      SpO2: 96%  95% 98%  Weight:      Height:      PainSc:    10-Worst pain ever    Isolation Precautions No active isolations  Medications Medications  sodium chloride flush (NS) 0.9 % injection 3 mL (has no administration in time range)  sodium chloride flush (NS) 0.9 % injection 3 mL (has no administration in time range)  0.9 %  sodium chloride infusion (has no administration in time range)  insulin glargine-yfgn (SEMGLEE) injection 20 Units (has no administration in time range)  insulin aspart (novoLOG) injection 0-15 Units (has no administration in time range)  insulin aspart (novoLOG) injection 0-5 Units (has no administration in time range)  potassium chloride (KLOR-CON) packet 40 mEq (has no administration in time range)  iohexol (OMNIPAQUE) 350 MG/ML injection 75 mL (75 mLs Intravenous Contrast Given 12/20/23 1648)  morphine (PF) 4 MG/ML injection 4 mg (4 mg Intravenous Given 12/20/23 1736)    Mobility walks with person assist     Focused Assessments Pulmonary Assessment Handoff:  Lung sounds:  O2 Device: Room Air      R Recommendations: See Admitting Provider Note  Report given to:   Additional Notes:

## 2023-12-20 NOTE — Progress Notes (Addendum)
ANTICOAGULATION CONSULT NOTE - Initial Consult  Pharmacy Consult for Heparin Indication: DVT  No Known Allergies  Patient Measurements: Height: 5\' 3"  (160 cm) Weight: 71.7 kg (158 lb) IBW/kg (Calculated) : 52.4 Heparin Dosing Weight: 67.4 kg  Vital Signs: Temp: 98.6 F (37 C) (12/26 1220) Temp Source: Oral (12/26 1220) BP: 131/88 (12/26 1500) Pulse Rate: 93 (12/26 1500)  Labs: Recent Labs    12/20/23 1231  HGB 17.0*  HCT 49.5*  PLT 82*  CREATININE 0.75    Estimated Creatinine Clearance: 79.8 mL/min (by C-G formula based on SCr of 0.75 mg/dL).   Medical History: Past Medical History:  Diagnosis Date   Diabetes mellitus without complication (HCC)    Fatty liver    Hyperlipidemia    Protein C deficiency (HCC)    Protein S deficiency (HCC)     Assessment: 39 yof with a history of cirrhosis, protein C/S deficiency, DVT, IVC thrombosis, PE. Patient is presenting with leg pain. Heparin per pharmacy consult placed for DVT.  Previously patient was on warfarin PTA.  Patient is now on enoxaparin 80 mg q12h prior to arrival per chart review. Medication history notes last dose of 12/26 0700 per patient.  Hgb 17; plt 82 PT/INR 14.9/1.1 aPTT 33 Heparin level 0.66 (NOT LMWH level -- on enoxaparin prior to arrival)  Goal of Therapy:  Heparin level 0.3-0.7 units/ml Monitor platelets by anticoagulation protocol: Yes   Plan:  No initial heparin bolus Start heparin infusion at 1200 units/hr PLT 82 on arrival -- monitor closely Check anti-Xa level at 0300 and daily while on heparin Continue to monitor H&H and platelets  Delmar Landau, PharmD, BCPS 12/20/2023 3:16 PM ED Clinical Pharmacist -  [phone]

## 2023-12-20 NOTE — H&P (Addendum)
History and Physical    Patient: Shelby Conner ZOX:[id number] DOB: 09/12/1971 DOA: 12/20/2023 DOS: the patient was seen and examined on 12/20/2023 PCP: Joaquin Music, NP  Patient coming from: Home  Chief Complaint:  Chief Complaint  Patient presents with   Leg Pain   HPI:  This is a 52 year old female with a past medical history of protein C and S deficiency as well as liver cirrhosis who presents to the emergency department with left lower extremity pain and subjective shortness of breath without desaturation.  The patient reports she hit her left shin approximately a month ago and was on alert for possible thrombosis like symptoms and has noticed over the last 2 to 3 days left sided groin pain of gradual onset intermittent character and moderate intensity.  The patient reports that shortness of breath is subjective at rest intermittently since this occurred and she thinks is attributed to possible anxiety.  She denies any chest pain diaphoresis fever chills or cough.  The patient reports she was last seen by hematologist oncologist 2 years ago at [facility] and stopped following when she was told she was no longer on the liver transplantation list.  She reports the etiology of her liver cirrhosis is multifactorial including alcohol and fatty infiltration secondary to habitus.  The patient reports compliance religiously to her 80 mg twice daily of subcutaneous enoxaparin which she has been on since February previously on Coumadin which she had stopped prior to her February episode for a dental procedure.  Past medical records reviewed and summarized discharge summary from 02/08/2023 at that point the patient had an IVC filter thrombosis the IVC filter was removed she had mechanical thrombectomy at that time and her stay was complicated by retroperitoneal bleed  The case was discussed with the emergency physician and additionally consulting hematologist oncologist Dr. Pricilla Holm at  7:09 PM.  His advice is for argatroban pending HIT antibodies and agrees the 4T score cannot be calculated given the chronicity of this patient's platelet drop cannot be established.  Review of Systems:  Constitutional: Denies Weight Loss, Fever, Chills or Night Sweats Eyes: Denies Blurry Vision, Eye Pain or Decreased Vision Endocrine: Denies Excess Thirst, Polyuria, Cold Intolerance, Heat Intolerance Psychiatric: Denies Depression, Anxiety, Ant-Depressant Usage , Denies Ethanol or Tobacco Abuse Hem/Lymphatic: Denies Easy Bruising Reports Blood Clots Allergy/Immun: Denies history of Hay Fever, Positive PPD or Hives All other systems were reviewed and are negative  Past Medical History:  Diagnosis Date   Diabetes mellitus without complication (HCC)    Fatty liver    Hyperlipidemia    Protein C deficiency (HCC)    Protein S deficiency (HCC)    Past Surgical History:  Procedure Laterality Date   ANKLE SURGERY Right    BIOPSY  11/16/2020   Procedure: BIOPSY;  Surgeon: Kathi Der, MD;  Location: [facility] ENDOSCOPY;  Service: Gastroenterology;;   CESAREAN SECTION     ESOPHAGOGASTRODUODENOSCOPY N/A 11/16/2020   Procedure: ESOPHAGOGASTRODUODENOSCOPY (EGD);  Surgeon: Kathi Der, MD;  Location: [facility] ENDOSCOPY;  Service: Gastroenterology;  Laterality: N/A;   INSERTION OF ILIAC STENT N/A 01/30/2023   Procedure: INSERTION OF InternalVena Cava Stent;  Surgeon: Maeola Harman, MD;  Location: [facility] OR;  Service: Vascular;  Laterality: N/A;   IR PARACENTESIS  11/12/2020   KNEE SURGERY Left    PERCUTANEOUS VENOUS THROMBECTOMY,LYSIS WITH INTRAVASCULAR ULTRASOUND (IVUS) N/A 01/30/2023   Procedure: PERCUTANEOUS VENOUS THROMBECTOMY AND LYSIS WITH INTRAVASCULAR ULTRASOUND (IVUS);  Surgeon: Maeola Harman, MD;  Location: [facility] OR;  Service:  Vascular;  Laterality: N/A;   VENOGRAM N/A 01/30/2023   Procedure: Venogram;  Surgeon: Maeola Harman, MD;  Location: [facility] OR;  Service:  Vascular;  Laterality: N/A;   Social History:  reports that she has quit smoking. She has never used smokeless tobacco. She reports current alcohol use. She reports that she does not use drugs.  No Known Allergies  Family History  Problem Relation Age of Onset   Hypertension Mother    Rheum arthritis Mother    Hyperlipidemia Father    Hypertension Father    Diabetes Father    Hypertension Maternal Grandmother    Hypertension Maternal Grandfather    Cancer Maternal Grandfather    Hypertension Paternal Grandmother    Heart failure Paternal Grandmother    Hypertension Paternal Grandfather    Liver disease Neg Hx     Prior to Admission medications   Not on File    Physical Exam: Vitals:   12/20/23 1630 12/20/23 1708 12/20/23 1730 12/20/23 1735  BP: 118/76  119/85 (!) 137/94  Pulse: 70  71 74  Resp: 14  10 14   Temp:  98.4 F (36.9 C)    TempSrc:      SpO2: 96%  95% 98%  Weight:      Height:      Patient Seen and Examined Room 34 at 6:35 PM Constitutional:  Vital Signs as per [facility] than three noted] No Acute Distress Eyes:  Pink Conjunctiva and no Ptosis Neck:     Trachea Midline, Neck Symmetric             Thyroid without tenderness, palpable masses or nodules Respiratory:   Respiratory Effort Normal: No Use of Respiratory Muscles,No  Intercostal Retractions             Lungs Clear to Auscultation Bilaterally Cardiovascular:   Heart Auscultated: Regular Regular without any added sounds or murmurs              Lower Extremity Edema Could not be appreciated, symmetric LE              CRT <2 Seconds B/L LE and sensation at baseline  Gastrointestinal:  Abdomen soft and nontender without palpable masses, guarding or rebound  Psychiatric:  Patient Orientated to Time, Place and Person Patient with appropriate mood and affect Recent and Remote Memory Intact Data Reviewed: Labs, Radiology, ECG as detailed in HPI and A/P   Assessment and Plan: * DVT (deep venous  thrombosis) (HCC)  Deep venous thrombosis of the left lower extremity On examination no signs of arterial compromise capillary refill time is less than 2 seconds the patient reports baseline neuropathy with bilateral changes no change from baseline sensation is intact Plan will be to anticoagulate with argatroban given the specter of HIT is possible   Duplex Doppler results: LEFT:  - Findings consistent with acute deep vein thrombosis involving the Distal  common femoral.  - Findings consistent with acute superficial vein thrombosis involving the  left small saphenous vein from the origin to the mid calf.  - Findings consistent with chronic deep vein thrombosis involving the left  femoral vein, left proximal profunda vein, left popliteal vein, left  posterior tibial veins, and left peroneal veins.  Findings consistent with chronic intramuscular thrombosis involving the  left gastrocnemius veins.   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 Patient has a new thrombotic event despite being compliant to enoxaparin and platelets have reduced from 218 10 months ago to 82 today  unfortunately the chronicity is unknown and accordingly we cannot calculate the 4Ts score although after consultation with hematology expert suggest argatroban and sending antibodies, hematology is  consulted, will follow platelet counts along with argatroban anticoagulation  Aortic atherosclerosis (HCC)   Aortic atherosclerosis This is incidental finding on the CTA will need outpatient interval follow-up   DM2 (diabetes mellitus, type 2) (HCC)  Type 2 diabetes mellitus Patient is on Tresiba 26 units at home we will transition to 20 units of long-acting along with before meals and at bedtime consistent carb diet while inpatient and follow  Acute pulmonary embolism (HCC)   Pulmonary embolism No evidence of acute cor pulmonale on this CTA and patient has biomarker negative with a troponin  negative CTA results:1. Bilateral lower lobe segmental pulmonary artery emboli. No evidence of right heart straining. 2. No focal consolidation, pleural effusion, or pneumothorax. 3. Fatty liver with changes of cirrhosis. 4.  Aortic Atherosclerosis (ICD10-I70.0). 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, previously 2 18 10  months ago we will follow serial CBC  Macrocytosis  Macrocytosis Patient's hemoglobin is 17 with MCV of 104 we will order TSH as well as B12 and folic acid levels   Hypercoagulable state (HCC)  Primary hypercoagulable state Patient has a known history of protein C&S deficiency Patient reports compliance to 80 mg twice daily of subcutaneous enoxaparin, We are transitioning to argatroban while HIT antibodies are pending  Hyponatremia  Hyponatremia Sodium 134, mild we will follow  Hypokalemia Hypokalemia Potassium 3.4 We will check magnesium levels as well as replete orally     Advance Care Planning:   Code Status: Full Code as per patient  Consults: Hematology  Family Communication: Patient declined, reports already updated  Severity of Illness: The appropriate patient status for this patient is INPATIENT. Inpatient status is judged to be reasonable and necessary in order to provide the required intensity of service to ensure the patient's safety. The patient's presenting symptoms, physical exam findings, and initial radiographic and laboratory data in the context of their chronic comorbidities is felt to place them at high risk for further clinical deterioration. Furthermore, it is not anticipated that the patient will be medically stable for discharge from the hospital within  2 midnights of admission.   * I certify that at the point of admission it is my clinical judgment that the patient will require inpatient hospital care spanning beyond 2 midnights from the point of admission due to high intensity of service, high risk for further deterioration and high frequency of surveillance required.*  8:47 PM Notified by nurse that the patient had some itchiness but no rash or shortness of breath likely morphine induced histamine release we will place as needed p.o. oxy and hydromorphone orders and monitor and notify as appropriate  D/W Pharmacist , awaiting on LFTs re: Argatroban , INR WNL noted  Author: Princess Bruins, MD 12/20/2023 8:02 PM  For on call review [email or web address].

## 2023-12-20 NOTE — Progress Notes (Signed)
PHARMACY - ANTICOAGULATION CONSULT NOTE  Pharmacy Consult for Argatroban Indication: Breakthrough DVT on enoxaparin prior to arrival. 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  No Known Allergies  Patient Measurements: Height: 5\' 3"  (160 cm) Weight: 71.7 kg (158 lb) IBW/kg (Calculated) : 52.4 Heparin Dosing Weight: 67.4 kg  Vital Signs: Temp: 98.4 F (36.9 C) (12/26 1708) Temp Source: Oral (12/26 1220) BP: 137/94 (12/26 1735) Pulse Rate: 74 (12/26 1735)  Labs: Recent Labs    12/20/23 1231 12/20/23 1613 12/20/23 1657  HGB 17.0*  --   --   HCT 49.5*  --   --   PLT 82*  --   --   APTT  --  33  --   LABPROT  --  14.9  --   INR  --  1.1  --   HEPARINUNFRC  --  0.66  --   CREATININE 0.75  --   --   TROPONINIHS 5  --  5    Estimated Creatinine Clearance: 79.8 mL/min (by C-G formula based on SCr of 0.75 mg/dL).   Medical History: Past Medical History:  Diagnosis Date   Diabetes mellitus without complication (HCC)    Fatty liver    Hyperlipidemia    Protein C deficiency (HCC)    Protein S deficiency (HCC)     Assessment: 42 yof with a history of cirrhosis, protein C/S deficiency, DVT, IVC thrombosis, PE. Patient is presenting with leg pain. Heparin per pharmacy initially ordered. However, now argatroban per pharmacy consult placed for breakthrough DVT on enoxaparin prior to arrival. 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   Previously patient was on warfarin PTA.  Patient is now on enoxaparin 80 mg q12h prior to arrival per chart review. Medication history notes last dose of 12/26 0700 per patient.  Heparin initially started at 1200 units/hr without a bolus.  AST 45; ALT 27; Alk Phos 121 Hgb 17; plt 82; PT/INR 14.9/1.1; aPTT 33 Heparin level 0.66 (NOT LMWH level -- on enoxaparin prior to arrival)  Goal of Therapy:  aPTT 50-90 seconds Monitor platelets by anticoagulation protocol: Yes   Plan:  Heparin stopped by attending Start argatroban at 1 mcg/kg/min (71.7  mcg/min) --Check aPTT at 0200 (4 hrs post-initiation)  Monitor CBC, aPTT daily, and hepatic function F/u HIT studies  Delmar Landau, PharmD, BCPS 12/20/2023 7:50 PM ED Clinical Pharmacist -  [phone]

## 2023-12-20 NOTE — Assessment & Plan Note (Addendum)
Incidental finding on the CTA

## 2023-12-20 NOTE — Assessment & Plan Note (Addendum)
Thrombocytopenia and new thrombotic event in a patient on Lovenox last 10 months.  Other diagnostic considerations include treatment failure or nonadherence (she states not) in the setting of portal hypertensive thrombocytopenia.  Chronicity of thrombocytopenia unknown. - Avoid heparins - Follow heparin antibodies - Continue argatroban infusion, monitoring and dosing per pharmacy - Consult Hematology - Plan for warfarin  - Discussed with Vascular, benefit from thrombectomy very low, no indication at present

## 2023-12-20 NOTE — ED Notes (Signed)
Pt denies chest pain and shortness of breath at this time. Respirations regular, unlabored. Patient alert and oriented x4.

## 2023-12-20 NOTE — ED Provider Notes (Signed)
[place] EMERGENCY DEPARTMENT AT [facility] Provider Note   CSN: [id number] Arrival date & time: 12/20/23  1211     History  Chief Complaint  Patient presents with   Leg Pain    Shelby Conner is a 52 y.o. female.   Leg Pain    Pt has history of cirrhosis, protein c and s deficiency, dvt, ivc thrombrosis.  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  She had been on Coumadin previously but now is on Lovenox injections.  Patient previously had an IVC filter but that was removed earlier this year.  Patient had filter removal and an IVC stent placed.  Patient had complications including retroperitoneal hematoma and required multiple blood transfusions.  Pt states she started having pain in her left groin area.  She injured her lower leg a couple weeks ago initially thought it was just related to the injury however she started noticing pain going up into her medial thigh and felt very similar to previous blood clots.  She has been feeling somewhat short of breath.  No chest pain.  Home Medications Prior to Admission medications   Medication Sig Start Date End Date Taking? Authorizing Provider  enoxaparin (LOVENOX) 80 MG/0.8ML injection Inject 0.8 mLs (80 mg total) into the skin 2 (two) times daily. 02/02/23 02/02/24 Yes Lanae Boast, MD  POTASSIUM PO Take 1 tablet by mouth every evening.   Yes [provider]  TRESIBA FLEXTOUCH 100 UNIT/ML FlexTouch Pen Inject 26 Units into the skin daily. 10/28/23  Yes [provider]      Allergies    Patient has no known allergies.    Review of Systems   Review of Systems  Physical Exam Updated Vital Signs BP (!) 137/94   Pulse 74   Temp 98.4 F (36.9 C)   Resp 14   Ht 1.6 m (5\' 3" )   Wt 71.7 kg   SpO2 98%   BMI 27.99 kg/m  Physical Exam Vitals and nursing note reviewed.  Constitutional:      General: She is not in acute distress.    Appearance: She is well-developed.   HENT:     Head: Normocephalic and atraumatic.     Right Ear: External ear normal.     Left Ear: External ear normal.  Eyes:     General: No scleral icterus.       Right eye: No discharge.        Left eye: No discharge.     Conjunctiva/sclera: Conjunctivae normal.  Neck:     Trachea: No tracheal deviation.  Cardiovascular:     Rate and Rhythm: Normal rate and regular rhythm.  Pulmonary:     Effort: Pulmonary effort is normal. No respiratory distress.     Breath sounds: Normal breath sounds. No stridor. No wheezing or rales.  Abdominal:     General: Bowel sounds are normal. There is no distension.     Palpations: Abdomen is soft.     Tenderness: There is no abdominal tenderness. There is no guarding or rebound.  Musculoskeletal:        General: Tenderness present. No deformity.     Cervical back: Neck supple.     Left lower leg: Edema present.  Skin:    General: Skin is warm and dry.     Findings: No rash.  Neurological:     General: No focal deficit present.     Mental Status: She is alert.  Cranial Nerves: No cranial nerve deficit, dysarthria or facial asymmetry.     Sensory: No sensory deficit.     Motor: No abnormal muscle tone or seizure activity.     Coordination: Coordination normal.  Psychiatric:        Mood and Affect: Mood normal.     ED Results / Procedures / Treatments   Labs (all labs ordered are listed, but only abnormal results are displayed) Labs Reviewed  CBC WITH DIFFERENTIAL/PLATELET - Abnormal; Notable for the following components:      Result Value   Hemoglobin 17.0 (*)    HCT 49.5 (*)    MCV 104.2 (*)    MCH 35.8 (*)    Platelets 82 (*)    All other components within normal limits  BASIC METABOLIC PANEL - Abnormal; Notable for the following components:   Sodium 134 (*)    Potassium 3.4 (*)    Chloride 97 (*)    Glucose, Bld 155 (*)    BUN 5 (*)    Calcium 8.8 (*)    All other components within normal limits  APTT  HEPARIN LEVEL  (UNFRACTIONATED)  PROTIME-INR  TROPONIN I (HIGH SENSITIVITY)  TROPONIN I (HIGH SENSITIVITY)    EKG EKG Interpretation Date/Time:  Thursday December 20 2023 15:10:04 EST Ventricular Rate:  82 PR Interval:  196 QRS Duration:  103 QT Interval:  419 QTC Calculation: 490 R Axis:   80  Text Interpretation: Sinus rhythm Consider left atrial enlargement Abnormal inferior Q waves Borderline prolonged QT interval No significant change since last tracing Confirmed by Linwood Dibbles [phone]) on 12/20/2023 3:11:52 PM  Radiology CT Angio Chest PE W/Cm &/Or Wo Cm Result Date: 12/20/2023 CLINICAL DATA:  Concern for pulmonary embolism. EXAM: CT ANGIOGRAPHY CHEST WITH CONTRAST TECHNIQUE: Multidetector CT imaging of the chest was performed using the standard protocol during bolus administration of intravenous contrast. Multiplanar CT image reconstructions and MIPs were obtained to evaluate the vascular anatomy. RADIATION DOSE REDUCTION: This exam was performed according to the departmental dose-optimization program which includes automated exposure control, adjustment of the mA and/or kV according to patient size and/or use of iterative reconstruction technique. CONTRAST:  75mL OMNIPAQUE IOHEXOL 350 MG/ML SOLN COMPARISON:  Chest radiograph dated 01/30/2023. FINDINGS: Cardiovascular: There is no cardiomegaly or pericardial effusion. There is mild atherosclerotic calcification of the thoracic aorta. No aneurysmal dilatation or dissection. The origins of the great vessels of the aortic arch appear patent. Bilateral lower lobe segmental pulmonary artery emboli. No evidence of right heart straining. Mediastinum/Nodes: No hilar or mediastinal adenopathy. Small hiatal hernia. The esophagus is grossly unremarkable. No mediastinal fluid collection. Lungs/Pleura: No focal consolidation, pleural effusion, pneumothorax. The central airways are patent. Upper Abdomen: Fatty liver with changes of cirrhosis. Past soft Jewel varices.  Musculoskeletal: No acute osseous pathology. Review of the MIP images confirms the above findings. IMPRESSION: 1. Bilateral lower lobe segmental pulmonary artery emboli. No evidence of right heart straining. 2. No focal consolidation, pleural effusion, or pneumothorax. 3. Fatty liver with changes of cirrhosis. 4.  Aortic Atherosclerosis (ICD10-I70.0). These results were called by telephone at the time of interpretation on 12/20/2023 at 5:21 pm to Dr. Lynelle Doctor, who verbally acknowledged these results. Electronically Signed   By: Elgie Collard M.D.   On: 12/20/2023 17:29   VAS [place] LOWER EXTREMITY VENOUS (DVT) (7a-7p) Result Date: 12/20/2023  Lower Venous DVT Study Patient Name:  Shelby Conner  Date of Exam:   12/20/2023 Medical Rec #: [id number]  Accession #:    [id number] Date of Birth: [birth year]                 Patient Gender: F Patient Age:   66 years Exam Location:  [facility] Procedure:      VAS [place] LOWER EXTREMITY VENOUS (DVT) Referring Phys: [facility] --------------------------------------------------------------------------------  Indications: Pain.  Risk Factors: Protein C deficiency and protein S deficiency DVT 01/27/23 extensive right from Distal CIV through to the PT and Peroneal veins and left EIV and distal CIV Patient had thrombectomy 01/29/23 and IVC filter removed 01/30/2023. Anticoagulation: Lovenox. Limitations: Poor ultrasound/tissue interface and bowel gas. Comparison Study: Prior study done 01/27/23, no showed to follow up appointment                   04/06/23 Performing Technologist: Sherren Kerns RVS  Examination Guidelines: A complete evaluation includes B-mode imaging, spectral Doppler, color Doppler, and power Doppler as needed of all accessible portions of each vessel. Bilateral testing is considered an integral part of a complete examination. Limited examinations for reoccurring indications may be performed as noted. The reflux portion of the exam is  performed with the patient in reverse Trendelenburg.  +-----+---------------+---------+-----------+----------+--------------+ RIGHTCompressibilityPhasicitySpontaneityPropertiesThrombus Aging +-----+---------------+---------+-----------+----------+--------------+ CFV  Full           Yes      No                                  +-----+---------------+---------+-----------+----------+--------------+ SFJ  Full                                                        +-----+---------------+---------+-----------+----------+--------------+ EIV                                               Not visualized +-----+---------------+---------+-----------+----------+--------------+   +---------+---------------+---------+-----------+----------+-------------------+ LEFT     CompressibilityPhasicitySpontaneityPropertiesThrombus Aging      +---------+---------------+---------+-----------+----------+-------------------+ CFV      Partial        No       No                   acute in distal                                                           portion             +---------+---------------+---------+-----------+----------+-------------------+ SFJ      Full           Yes      No                                       +---------+---------------+---------+-----------+----------+-------------------+ FV Prox  Partial        No       No  Chronic             +---------+---------------+---------+-----------+----------+-------------------+ FV Mid   Partial                                      Chronic             +---------+---------------+---------+-----------+----------+-------------------+ FV DistalPartial                                                          +---------+---------------+---------+-----------+----------+-------------------+ PFV      Partial                                      Chronic              +---------+---------------+---------+-----------+----------+-------------------+ POP      Partial        No       No                   Chronic             +---------+---------------+---------+-----------+----------+-------------------+ PTV      Partial                                      Chronic             +---------+---------------+---------+-----------+----------+-------------------+ PERO     Partial                                      Chronic             +---------+---------------+---------+-----------+----------+-------------------+ Gastroc  Partial        No       No                   Chronic             +---------+---------------+---------+-----------+----------+-------------------+ SSV      None                                         acute from origin                                                         to mid calf         +---------+---------------+---------+-----------+----------+-------------------+ EIV                     No       No                   patent by color     +---------+---------------+---------+-----------+----------+-------------------+ CIV  Not visualized      +---------+---------------+---------+-----------+----------+-------------------+     Summary: RIGHT: - No evidence of common femoral vein obstruction.   LEFT: - Findings consistent with acute deep vein thrombosis involving the Distal common femoral. - Findings consistent with acute superficial vein thrombosis involving the left small saphenous vein from the origin to the mid calf.  - Findings consistent with chronic deep vein thrombosis involving the left femoral vein, left proximal profunda vein, left popliteal vein, left posterior tibial veins, and left peroneal veins. 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  *See table(s) above for measurements and observations.  Electronically signed by Gerarda Fraction on 12/20/2023 at 4:21:48 PM.    Final     Procedures .Critical Care  Performed by: Linwood Dibbles, MD Authorized by: Linwood Dibbles, MD   Critical care provider statement:    Critical care time (minutes):  30   Critical care was time spent personally by me on the following activities:  Development of treatment plan with patient or surrogate, discussions with consultants, evaluation of patient's response to treatment, examination of patient, ordering and review of laboratory studies, ordering and review of radiographic studies, ordering and performing treatments and interventions, pulse oximetry, re-evaluation of patient's condition and review of old charts     Medications Ordered in ED Medications  iohexol (OMNIPAQUE) 350 MG/ML injection 75 mL (75 mLs Intravenous Contrast Given 12/20/23 1648)  morphine (PF) 4 MG/ML injection 4 mg (4 mg Intravenous Given 12/20/23 1736)    ED Course/ Medical Decision Making/ A&P Clinical Course as of 12/20/23 1745  Thu Dec 20, 2023  1459 US shows acute DVT.  Findings consistent with acute deep vein thrombosis involving the Distal common femoral. - Findings consistent with acute superficial vein thrombosis involving the left small saphenous vein from the origin to the mid calf.     - Findings consistent with chronic deep vein thrombosis involving the left femoral vein, left proximal profunda vein, left popliteal vein, left posterior tibial veins, and left peroneal veins. 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   [JK]  1459 CBC with Differential(!) Cbc with increased hgb [JK]  1723 CT scan shows bilateral PE without heart strain [JK]    Clinical Course User Index [JK] Linwood Dibbles, MD                                 Medical Decision Making Problems Addressed: Acute deep vein thrombosis (DVT) of femoral vein of left lower extremity [facility]): acute illness or  injury that poses a threat to life or bodily functions Acute pulmonary embolism without acute cor pulmonale, unspecified pulmonary embolism type [facility]): acute illness or injury that poses a threat to life or bodily functions  Amount and/or Complexity of Data Reviewed Labs: ordered. Decision-making details documented in ED Course.  Risk Prescription drug management. Decision regarding hospitalization.   Patient presented to ED for evaluation of increasing leg swelling.  Patient has complicated history of multiple DVTs and PE.  She has protein C&S deficiency.  Patient has been taking Lovenox.  Unfortunately she has recurrent DVT here.  Her CT scan also shows evidence of pulmonary embolism.  Fortunately no evidence of heart strain.  Patient is not hypoxic.  She is not tachycardic.  She does not have an oxygen requirement.  I have started the patient on IV heparin.  I will consult the medical service for  admission, discussed with Dr Dorna Mai        Final Clinical Impression(s) / ED Diagnoses Final diagnoses:  Acute pulmonary embolism without acute cor pulmonale, unspecified pulmonary embolism type (HCC)  Acute deep vein thrombosis (DVT) of femoral vein of left lower extremity  [facility])    Rx / DC Orders ED Discharge Orders     None         Linwood Dibbles, MD 12/21/23 1107

## 2023-12-20 NOTE — ED Notes (Signed)
Patient transported to CT 

## 2023-12-20 NOTE — Assessment & Plan Note (Addendum)
TSH elevated.  Folate and B12 normal. - Check fT4

## 2023-12-20 NOTE — Progress Notes (Signed)
VASCULAR LAB    Left lower extremity venous duplex has been performed.  See CV proc for preliminary results.  Messaged results to [facility], PA-C  Elyanna Wallick, RVT 12/20/2023, 2:23 PM

## 2023-12-20 NOTE — Assessment & Plan Note (Addendum)
Repleted.

## 2023-12-20 NOTE — Assessment & Plan Note (Addendum)
Due Alcohol, MASLD.  INR normal.  Tbili 1.9, albumin 3.1, well compensated.  Hx portal hypertension by imaging.  Does not follow with GI anymore. Not on diuretics.

## 2023-12-20 NOTE — ED Triage Notes (Signed)
Pt reports pain to her L calf that has recently traveled to her L groin area, consistent with previous DVTs. Pt has extensive hx of blood clots. Anticoagulated on Lovenox q12. Pt also endorses DOE.

## 2023-12-20 NOTE — Assessment & Plan Note (Addendum)
Small, mild symptoms, no heart strain, hemodynamically stable - See above

## 2023-12-20 NOTE — Assessment & Plan Note (Addendum)
CT from February showed stigmata of portal HTN and varices.  Unclear what the origin of her thrombocytopenia is. - Follow HIT panel - Trend platelets

## 2023-12-20 NOTE — Assessment & Plan Note (Addendum)
Mild, asymptomatic.

## 2023-12-20 NOTE — Assessment & Plan Note (Addendum)
Chronic clot in the distal veins.  Acute clot up to the distal common femoral.  Able to walk.  Pain controlled.  No phlegmasia

## 2023-12-20 NOTE — ED Notes (Signed)
Called lab for follow up on labs sent. Lab states they have received them, now in process.

## 2023-12-20 NOTE — ED Notes (Signed)
Unsuccessful attempts at drawing labs by previous medic and this RN. Still awaiting IV team to come. MD and pharmacy notified of delay.

## 2023-12-20 NOTE — ED Notes (Signed)
Spoke with phlebotomy about obtaining labs for this pt. Phlebotomy to see.

## 2023-12-20 NOTE — Assessment & Plan Note (Addendum)
History of protein C&S deficiency.  Was on warfarin until last February.  At that time her warfarin was held for dental procedure without a bridge, she developed clot of her IVC, and a bad left leg DVT requiring thrombectomy, and also complicated by an RP bleed.  At the time of discharge, she was on Lovenox twice daily, and was recommended to follow-up with hematology with plans to transition to DOAC, but she did not follow up.  She claims to have been on Lovenox BID since then. - Argatroban - Consult hematology

## 2023-12-20 NOTE — ED Notes (Signed)
No Heparin for this pt per MD Order.

## 2023-12-20 NOTE — ED Provider Triage Note (Cosign Needed)
Emergency Medicine Provider Triage Evaluation Note  Shelby Conner , a 52 y.o. female  was evaluated in triage.  Pt complains of leg pain.  Review of Systems  Positive:  Negative:   Physical Exam  BP (!) 162/108 (BP Location: Right Arm)   Pulse (!) 108   Temp 98.6 F (37 C) (Oral)   Resp 16   Ht 5\' 3"  (1.6 m)   Wt 71.7 kg   SpO2 99%   BMI 27.99 kg/m  Gen:   Awake, no distress   Resp:  Normal effort  MSK:   Moves extremities without difficulty  Other:    Medical Decision Making  Medically screening exam initiated at 12:24 PM.  Appropriate orders placed.  Shelby Conner was informed that the remainder of the evaluation will be completed by another provider, this initial triage assessment does not replace that evaluation, and the importance of remaining in the ED until their evaluation is complete.  Extensive Hx of blood clots. Patient taking Lovenox  injections BID. Had filter removed in February. Patient hit leg/shin 1 month ago. Patient has had L calf pain progressing over the past month and now radiating to left groin. Also with intermittent DOE - patient unsure if it is anxiety or real.    Dorthy Cooler, PA-C 12/20/23 1227

## 2023-12-21 DIAGNOSIS — I2699 Other pulmonary embolism without acute cor pulmonale: Secondary | ICD-10-CM

## 2023-12-21 DIAGNOSIS — I82412 Acute embolism and thrombosis of left femoral vein: Secondary | ICD-10-CM | POA: Diagnosis not present

## 2023-12-21 DIAGNOSIS — D75829 Heparin-induced thrombocytopenia, unspecified: Secondary | ICD-10-CM | POA: Diagnosis not present

## 2023-12-21 DIAGNOSIS — I82491 Acute embolism and thrombosis of other specified deep vein of right lower extremity: Secondary | ICD-10-CM

## 2023-12-21 LAB — COMPREHENSIVE METABOLIC PANEL
ALT: 23 U/L (ref 0–44)
AST: 45 U/L — ABNORMAL HIGH (ref 15–41)
Albumin: 3.1 g/dL — ABNORMAL LOW (ref 3.5–5.0)
Alkaline Phosphatase: 112 U/L (ref 38–126)
Anion gap: 8 (ref 5–15)
BUN: 5 mg/dL — ABNORMAL LOW (ref 6–20)
CO2: 27 mmol/L (ref 22–32)
Calcium: 8.6 mg/dL — ABNORMAL LOW (ref 8.9–10.3)
Chloride: 101 mmol/L (ref 98–111)
Creatinine, Ser: 0.76 mg/dL (ref 0.44–1.00)
GFR, Estimated: >60 mL/min (ref >60)
Glucose, Bld: 98 mg/dL (ref 70–99)
Potassium: 4.4 mmol/L (ref 3.5–5.1)
Sodium: 136 mmol/L (ref 135–145)
Total Bilirubin: 1.7 mg/dL — ABNORMAL HIGH (ref <1.2)
Total Protein: 7 g/dL (ref 6.5–8.1)

## 2023-12-21 LAB — CBC
HCT: 45.2 % (ref 36.0–46.0)
Hemoglobin: 15.4 g/dL — ABNORMAL HIGH (ref 12.0–15.0)
MCH: 35.9 pg — ABNORMAL HIGH (ref 26.0–34.0)
MCHC: 34.1 g/dL (ref 30.0–36.0)
MCV: 105.4 fL — ABNORMAL HIGH (ref 80.0–100.0)
Platelets: 94 10*3/uL — ABNORMAL LOW (ref 150–400)
RBC: 4.29 MIL/uL (ref 3.87–5.11)
RDW: 15.1 % (ref 11.5–15.5)
WBC: 6.9 10*3/uL (ref 4.0–10.5)
nRBC: 0 % (ref 0.0–0.2)

## 2023-12-21 LAB — PROTIME-INR
INR: 1.7 — ABNORMAL HIGH (ref 0.8–1.2)
Prothrombin Time: 20.2 s — ABNORMAL HIGH (ref 11.4–15.2)

## 2023-12-21 LAB — GLUCOSE, CAPILLARY
Glucose-Capillary: 94 mg/dL (ref 70–99)
Glucose-Capillary: 137 mg/dL — ABNORMAL HIGH (ref 70–99)
Glucose-Capillary: 164 mg/dL — ABNORMAL HIGH (ref 70–99)
Glucose-Capillary: 136 mg/dL — ABNORMAL HIGH (ref 70–99)

## 2023-12-21 LAB — APTT
aPTT: 48 s — ABNORMAL HIGH (ref 24–36)
aPTT: 49 s — ABNORMAL HIGH (ref 24–36)
aPTT: 51 s — ABNORMAL HIGH (ref 24–36)
aPTT: 51 s — ABNORMAL HIGH (ref 24–36)

## 2023-12-21 LAB — HEMOGLOBIN A1C
Hgb A1c MFr Bld: 8 % — ABNORMAL HIGH (ref 4.8–5.6)
Mean Plasma Glucose: 183 mg/dL

## 2023-12-21 NOTE — Consult Note (Addendum)
Shelby Conner  [phone] [phone]    HEMATOLOGY CONSULTATION  PURPOSE OF CONSULTATION/CHIEF COMPLAINT:  DVT Protein C/S deficiency  Referring MD:  Dr. Maryfrances Conner   HPI: Shelby Conner is a 52 year old female patient who came to the ER with complaints of left lower extremity pain and shortness of breath.  Patient reported that she hit her left shin approximately 1 month ago and was being alert for possible thrombosis like symptoms, she has noticed over the previous 2 to 3 days intermittent left-sided groin pain.  Past medical history is significant for cirrhosis for which patient stated she used to see hematology at [facility] and stopped following when she was told she was no longer on liver transplant list.  Apparently she has been on Lovenox 80 mg twice a day.   Workup was done in the ED which included CT angio chest that showed bilateral lower lobe segmental pulmonary artery emboli. Decision was made to anticoagulate with argatroban. Also seen was fatty liver with changes of cirrhosis. Patient is assessed and examined today.  She is awake alert and oriented x 3.  In no acute distress that is noted at this time.  Patient gives detailed history of hypercoagulable state going back to age 51 or 85 when she was started on Coumadin.  Reports she has had multiple thrombus over the years and was on Coumadin, Xarelto and more recently Lovenox.  She stated out of some point she was on heparin as well and had clots while on heparin therefore that was discontinued.  She believes Xarelto was discontinued due to her liver issues however she is not sure why it was stopped.  Patient had IVC filter which was removed in February 2024. Past medical history as previously stated is significant for cirrhosis which is confirmed on CT angio done 12/20/2023.  Patient reports that she was told several years ago that she had a fatty liver. Family history is significant for father with  undiagnosed "clot and blood disorder".  ASSESSMENT AND PLAN:  Pulmonary embolism/ Hypercoagulable state - Patient with history of protein C/S deficiency.  History of multiple pulmonary emboli, and DVT. - Per CT angio chest done 12/20/2023 demonstrated bilateral lower lobe segmental pulmonary artery emboli. - Patient has been on multiple anticoagulant agents including Coumadin, Xarelto, heparin, and more recently Lovenox. - She reports compliance with Lovenox daily 80 mg twice a day at home - Currently on argatroban 1 Mg per mL IV infusion started on 12/20/2023, agree with plan. - Hematology recommendations for anticoagulation per Dr. Myna Conner.  Patient agreeable to patient follow-up with Dr. Myna Conner upon discharge. - Monitor closely for bleeding  2. Cirrhosis -Patient reports previously followed at [facility] liver transplant [facility] however no longer follows.  She also details history of fatty liver which was diagnosed several years ago per patient. - Normal INR - Monitor LFTs  3.  Thrombocytopenia - Mild - Low platelets 82 had admission with slight rise to 94K today - Transfuse platelets for counts less than 20K or less than 50K with bleeding - HIT panel pending - Monitor CBC with differential closely  4.  History of anemia Macrocytic - Patient with previous hemoglobin in 8 range. - MCV elevated 105.4 - Hemoglobin now increased to 15.4 today 12/21/2023. - B12 levels were checked and is suboptimal at 213. Recommend B12 supplementation.   - Monitor CBC with differential closely   Past Medical History:  Diagnosis Date   Diabetes mellitus without complication (HCC)  Fatty liver    Hyperlipidemia    Protein C deficiency (HCC)    Protein S deficiency (HCC)   :  Past Surgical History:  Procedure Laterality Date   ANKLE SURGERY Right    BIOPSY  11/16/2020   Procedure: BIOPSY;  Surgeon: Shelby Der, MD;  Location: [facility] ENDOSCOPY;  Service: Gastroenterology;;   CESAREAN SECTION      ESOPHAGOGASTRODUODENOSCOPY N/A 11/16/2020   Procedure: ESOPHAGOGASTRODUODENOSCOPY (EGD);  Surgeon: Shelby Der, MD;  Location: [facility]COPY;  Service: Gastroenterology;  Laterality: N/A;   INSERTION OF ILIAC STENT N/A 01/30/2023   Procedure: INSERTION OF InternalVena Cava Stent;  Surgeon: Shelby Harman, MD;  Location: [facility] OR;  Service: Vascular;  Laterality: N/A;   IR PARACENTESIS  11/12/2020   KNEE SURGERY Left    PERCUTANEOUS VENOUS THROMBECTOMY,LYSIS WITH INTRAVASCULAR ULTRASOUND (IVUS) N/A 01/30/2023   Procedure: PERCUTANEOUS VENOUS THROMBECTOMY AND LYSIS WITH INTRAVASCULAR ULTRASOUND (IVUS);  Surgeon: Shelby Harman, MD;  Location[facility];  Service: Vascular;  Laterality: N/A;   VENOGRAM N/A 01/30/2023   Procedure: Shelby Conner;  Surgeon: Shelby Harman, MD;  Location: [facility] OR;  Service: Vascular;  Laterality: N/A;  :  Allergies  Allergen Reactions   Heparin Other (See Comments)    Per MD order   Morphine Itching    Per pt   :   Family History  Problem Relation Age of Onset   Hypertension Mother    Rheum arthritis Mother    Hyperlipidemia Father    Hypertension Father    Diabetes Father    Hypertension Maternal Grandmother    Hypertension Maternal Grandfather    Cancer Maternal Grandfather    Hypertension Paternal Grandmother    Heart failure Paternal Grandmother    Hypertension Paternal Grandfather    Liver disease Neg Hx   :   Social History   Socioeconomic History   Marital status: Married    Spouse name: Not on file   Number of children: Not on file   Years of education: Not on file   Highest education level: Not on file  Occupational History   Not on file  Tobacco Use   Smoking status: Former   Smokeless tobacco: Never  Vaping Use   Vaping status: Never Used  Substance and Sexual Activity   Alcohol use: Yes    Comment: occasionally   Drug use: Never   Sexual activity: Yes    Partners: Male    Birth control/protection:  None  Other Topics Concern   Not on file  Social History Narrative   Not on file   Social Drivers of Health   Financial Resource Strain: Not on file  Food Insecurity: No Food Insecurity (12/20/2023)   Hunger Vital Sign    Worried About Running Out of Food in the Last Year: Never true    Ran Out of Food in the Last Year: Never true  Transportation Needs: No Transportation Needs (12/20/2023)   PRAPARE - Administrator, Civil Service (Medical): No    Lack of Transportation (Non-Medical): No  Physical Activity: Not on file  Stress: Not on file  Social Connections: Unknown (05/09/2022)   Received from [facility]   Social Network    Social Network: Not on file  Intimate Partner Violence: Not At Risk (12/20/2023)   Humiliation, Afraid, Rape, and Kick questionnaire    Fear of Current or Ex-Partner: No    Emotionally Abused: No    Physically Abused: No    Sexually  Abused: No  :   CURRENT MEDS: Current Facility-Administered Medications  Medication Dose Route Frequency Provider Last Rate Last Admin   0.9 %  sodium chloride infusion  250 mL Intravenous PRN Core, Doy Hutching, MD       argatroban 1 mg/mL infusion  1.3 mcg/kg/min Intravenous Continuous Stevphen Rochester, RPH 5.59 mL/hr at 12/21/23 0618 1.3 mcg/kg/min at 12/21/23 0618   diphenhydrAMINE (BENADRYL) injection 25 mg  25 mg Intravenous Q4H PRN Core, Doy Hutching, MD   25 mg at 12/21/23 3016   HYDROmorphone (DILAUDID) injection 0.5 mg  0.5 mg Intravenous Q4H PRN Core, Doy Hutching, MD   0.5 mg at 12/21/23 1157   insulin aspart (novoLOG) injection 0-15 Units  0-15 Units Subcutaneous TID WC Core, Doy Hutching, MD   2 Units at 12/21/23 1156   insulin aspart (novoLOG) injection 0-5 Units  0-5 Units Subcutaneous QHS Core, Doy Hutching, MD       insulin glargine-yfgn (SEMGLEE) injection 20 Units  20 Units Subcutaneous QHS Core, Doy Hutching, MD   20 Units at 12/20/23 2243   oxyCODONE (Oxy IR/ROXICODONE) immediate release tablet 10  mg  10 mg Oral Q4H PRN Core, Doy Hutching, MD   10 mg at 12/21/23 0109   oxyCODONE (Oxy IR/ROXICODONE) immediate release tablet 5 mg  5 mg Oral Q4H PRN Core, Doy Hutching, MD       sodium chloride flush (NS) 0.9 % injection 3 mL  3 mL Intravenous Q12H Core, Doy Hutching, MD       sodium chloride flush (NS) 0.9 % injection 3 mL  3 mL Intravenous PRN Core, Doy Hutching, MD        REVIEW OF SYSTEMS:   Constitutional: Denies fevers, chills or abnormal night sweats Eyes: Denies blurriness of vision, double vision or watery eyes Ears, nose, mouth, throat, and face: Denies mucositis or sore throat Respiratory: Denies cough, dyspnea or wheezes Cardiovascular: Denies palpitation, chest discomfort or lower extremity swelling Gastrointestinal: Denies nausea, heartburn or change in bowel habits Skin: Denies abnormal skin rashes Lymphatics: Denies new lymphadenopathy or easy bruising Neurological: Denies numbness, tingling or new weaknesses Behavioral/Psych: Mood is stable, no new changes  All other systems were reviewed with the patient and are negative.  PHYSICAL EXAMINATION: ECOG PERFORMANCE STATUS: 1 - Symptomatic but completely ambulatory  Vitals:   12/21/23 0731 12/21/23 1130  BP: 114/75 114/72  Pulse: (!) 50 (!) 55  Resp:  16  Temp: 98.3 F (36.8 C) 98.4 F (36.9 C)  SpO2: 90%    Filed Weights   12/20/23 1221  Weight: 158 lb (71.7 kg)    GENERAL: alert, no distress and comfortable SKIN: skin color, texture, turgor are normal, no rashes or significant lesions EYES: normal, conjunctiva are pink and non-injected, sclera clear OROPHARYNX: no exudate, no erythema and lips, buccal mucosa, and tongue normal  NECK: supple, thyroid normal size, non-tender, without nodularity LYMPH: no palpable lymphadenopathy in the cervical, axillary or inguinal LUNGS: clear to auscultation and percussion with normal breathing effort HEART: regular rate & rhythm and no murmurs and no lower extremity edema ABDOMEN:  abdomen soft, non-tender and normal bowel sounds MUSCULOSKELETAL: no cyanosis of digits and no clubbing  PSYCH: alert & oriented x 3 with fluent speech NEURO: no focal motor/sensory deficits   LABS: Lab Results  Component Value Date   WBC 6.9 12/21/2023   HGB 15.4 (H) 12/21/2023   HCT 45.2 12/21/2023   MCV 105.4 (H) 12/21/2023   PLT 94 (L) 12/21/2023  Lab Results  Component Value Date   WBC 6.9 12/21/2023   HGB 15.4 (H) 12/21/2023   HCT 45.2 12/21/2023   PLT 94 (L) 12/21/2023   GLUCOSE 98 12/21/2023   ALT 23 12/21/2023   AST 45 (H) 12/21/2023   NA 136 12/21/2023   K 4.4 12/21/2023   CL 101 12/21/2023   CREATININE 0.76 12/21/2023   BUN 5 (L) 12/21/2023   CO2 27 12/21/2023   INR 1.7 (H) 12/21/2023   HGBA1C 11.2 (H) 01/28/2023    CT Angio Chest PE W/Cm &/Or Wo Cm Result Date: 12/20/2023 CLINICAL DATA:  Concern for pulmonary embolism. EXAM: CT ANGIOGRAPHY CHEST WITH CONTRAST TECHNIQUE: Multidetector CT imaging of the chest was performed using the standard protocol during bolus administration of intravenous contrast. Multiplanar CT image reconstructions and MIPs were obtained to evaluate the vascular anatomy. RADIATION DOSE REDUCTION: This exam was performed according to the departmental dose-optimization program which includes automated exposure control, adjustment of the mA and/or kV according to patient size and/or use of iterative reconstruction technique. CONTRAST:  75mL OMNIPAQUE IOHEXOL 350 MG/ML SOLN COMPARISON:  Chest radiograph dated 01/30/2023. FINDINGS: Cardiovascular: There is no cardiomegaly or pericardial effusion. There is mild atherosclerotic calcification of the thoracic aorta. No aneurysmal dilatation or dissection. The origins of the great vessels of the aortic arch appear patent. Bilateral lower lobe segmental pulmonary artery emboli. No evidence of right heart straining. Mediastinum/Nodes: No hilar or mediastinal adenopathy. Small hiatal hernia. The esophagus  is grossly unremarkable. No mediastinal fluid collection. Lungs/Pleura: No focal consolidation, pleural effusion, pneumothorax. The central airways are patent. Upper Abdomen: Fatty liver with changes of cirrhosis. Past soft Jewel varices. Musculoskeletal: No acute osseous pathology. Review of the MIP images confirms the above findings. IMPRESSION: 1. Bilateral lower lobe segmental pulmonary artery emboli. No evidence of right heart straining. 2. No focal consolidation, pleural effusion, or pneumothorax. 3. Fatty liver with changes of cirrhosis. 4.  Aortic Atherosclerosis (ICD10-I70.0). These results were called by telephone at the time of interpretation on 12/20/2023 at 5:21 pm to Dr. Lynelle Doctor, who verbally acknowledged these results. Electronically Signed   By: Elgie Collard M.D.   On: 12/20/2023 17:29   VAS [place] LOWER EXTREMITY VENOUS (DVT) (7a-7p) Result Date: 12/20/2023  Lower Venous DVT Study Patient Name:  Shelby Conner  Date of Exam:   12/20/2023 Medical Rec #: [id number]                Accession #:    [id number] Date of Birth: 12/07/1971                 Patient Gender: F Patient Age:   45 years Exam Location:  [facility] Procedure:      VAS [place] LOWER EXTREMITY VENOUS (DVT) Referring Phys: [facility] --------------------------------------------------------------------------------  Indications: Pain.  Risk Factors: Protein C deficiency and protein S deficiency DVT 01/27/23 extensive right from Distal CIV through to the PT and Peroneal veins and left EIV and distal CIV Patient had thrombectomy 01/29/23 and IVC filter removed 01/30/2023. Anticoagulation: Lovenox. Limitations: Poor ultrasound/tissue interface and bowel gas. Comparison Study: Prior study done 01/27/23, no showed to follow up appointment                   04/06/23 Performing Technologist: Sherren Kerns RVS  Examination Guidelines: A complete evaluation includes B-mode imaging, spectral Doppler, color Doppler, and power Doppler as  needed of all accessible portions of each vessel. Bilateral testing is considered an integral part of a  complete examination. Limited examinations for reoccurring indications may be performed as noted. The reflux portion of the exam is performed with the patient in reverse Trendelenburg.  +-----+---------------+---------+-----------+----------+--------------+ RIGHTCompressibilityPhasicitySpontaneityPropertiesThrombus Aging +-----+---------------+---------+-----------+----------+--------------+ CFV  Full           Yes      No                                  +-----+---------------+---------+-----------+----------+--------------+ SFJ  Full                                                        +-----+---------------+---------+-----------+----------+--------------+ EIV                                               Not visualized +-----+---------------+---------+-----------+----------+--------------+   +---------+---------------+---------+-----------+----------+-------------------+ LEFT     CompressibilityPhasicitySpontaneityPropertiesThrombus Aging      +---------+---------------+---------+-----------+----------+-------------------+ CFV      Partial        No       No                   acute in distal                                                           portion             +---------+---------------+---------+-----------+----------+-------------------+ SFJ      Full           Yes      No                                       +---------+---------------+---------+-----------+----------+-------------------+ FV Prox  Partial        No       No                   Chronic             +---------+---------------+---------+-----------+----------+-------------------+ FV Mid   Partial                                      Chronic             +---------+---------------+---------+-----------+----------+-------------------+ FV DistalPartial                                                           +---------+---------------+---------+-----------+----------+-------------------+ PFV      Partial  Chronic             +---------+---------------+---------+-----------+----------+-------------------+ POP      Partial        No       No                   Chronic             +---------+---------------+---------+-----------+----------+-------------------+ PTV      Partial                                      Chronic             +---------+---------------+---------+-----------+----------+-------------------+ PERO     Partial                                      Chronic             +---------+---------------+---------+-----------+----------+-------------------+ Gastroc  Partial        No       No                   Chronic             +---------+---------------+---------+-----------+----------+-------------------+ SSV      None                                         acute from origin                                                         to mid calf         +---------+---------------+---------+-----------+----------+-------------------+ EIV                     No       No                   patent by color     +---------+---------------+---------+-----------+----------+-------------------+ CIV                                                   Not visualized      +---------+---------------+---------+-----------+----------+-------------------+     Summary: RIGHT: - No evidence of common femoral vein obstruction.   LEFT: - Findings consistent with acute deep vein thrombosis involving the Distal common femoral. - Findings consistent with acute superficial vein thrombosis involving the left small saphenous vein from the origin to the mid calf.  - Findings consistent with chronic deep vein thrombosis involving the left femoral vein, left proximal profunda vein, left popliteal vein, left  posterior tibial veins, and left peroneal veins. 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  *See table(s) above for measurements and observations. Electronically signed by Gerarda Fraction on 12/20/2023 at 4:21:48 PM.    Final      The total time spent in the appointment was 40 minutes encounter with patients including review of  chart and various tests results, discussions about plan of care and coordination of care plan   All questions were answered. The patient knows to call the clinic with any problems, questions or concerns. No barriers to learning was detected.  Thank you for the courtesy of this consultation, Dawson Bills, NP  12/27/20241:48 PM  ADDENDUM: I agree with the assessment above.  This is quite troublesome.  She has been very religious with taking the Lovenox.  I currently doubt that this is HITT.  She has been on Lovenox for 9 months.  I do not think that there is any indication for thrombectomy.  Her left leg does not look all that bad.  I will continue her on the argatroban for right now.  She has cirrhosis.  However, I think your liver tests are okay.  Her albumin is 3.1.  I think the options for treatment would be either Arixtra or Pradaxa.  I think both these would be reasonable to try.  I will continue her on the argatroban over the weekend.  We will have to monitor her LFTs.  We will have to see what her platelet count is.  I suppose her platelet count to be low just because of her underlying cirrhosis and likely an element of splenomegaly.  She is very nice.  I know that she she is incredibly motivated to continue her anticoagulation.  I know that this should not be an issue with respect to recurrence of thromboembolism.  Would not surprise me if she has some type of venous structural damage from these blood clots.  We will follow along.  Christin Bach, MD  Duwayne Heck 41:10

## 2023-12-21 NOTE — Progress Notes (Addendum)
PHARMACY - ANTICOAGULATION CONSULT NOTE  Pharmacy Consult for Argatroban Indication: Breakthrough DVT on enoxaparin prior to arrival. 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  Allergies  Allergen Reactions   Heparin Other (See Comments)    Per MD order   Morphine Itching    Per pt     Patient Measurements: Height: 5\' 3"  (160 cm) Weight: 71.7 kg (158 lb) IBW/kg (Calculated) : 52.4 Heparin Dosing Weight: 67.4 kg  Vital Signs: Temp: 98.4 F (36.9 C) (12/27 1130) Temp Source: Oral (12/27 1130) BP: 114/72 (12/27 1130) Pulse Rate: 55 (12/27 1130)  Labs: Recent Labs    12/20/23 1231 12/20/23 1613 12/20/23 1657 12/21/23 0315 12/21/23 1019  HGB 17.0*  --   --  15.4*  --   HCT 49.5*  --   --  45.2  --   PLT 82*  --   --  94*  --   APTT  --  33  --  48* 49*  LABPROT  --  14.9  --  20.2*  --   INR  --  1.1  --  1.7*  --   HEPARINUNFRC  --  0.66  --   --   --   CREATININE 0.75  --   --  0.76  --   TROPONINIHS 5  --  5  --   --     Estimated Creatinine Clearance: 79.8 mL/min (by C-G formula based on SCr of 0.76 mg/dL).   Medical History: Past Medical History:  Diagnosis Date   Diabetes mellitus without complication (HCC)    Fatty liver    Hyperlipidemia    Protein C deficiency (HCC)    Protein S deficiency (HCC)     Assessment: 71 yof with a history of cirrhosis, protein C/S deficiency, DVT, IVC thrombosis, PE. Patient is presenting with leg pain. Heparin per pharmacy initially ordered. However, now argatroban per pharmacy consult placed for breakthrough DVT on enoxaparin prior to arrival. 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   Previously patient was on warfarin PTA.  Patient is now on enoxaparin 80 mg q12h prior to arrival per chart review. Medication history notes last dose of 12/26 0700 per patient.  Heparin initially started at 1200 units/hr without a bolus. 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 Patient transitioned to  argatroban 12/26 PM.   aPTT this morning was 49 (subtherapeutic) on 1.3 mcg/kg/min. Per RN, there was some blood return into the IV extension when the patient was moving her arm. Overnight, NS was initiated to help keep the line clear but it continued to occlude. NS was discontinued around 10 am this morning and there have been no issues with infusion since. Given issues with occlusion overnight and this morning, will not adjust rate and recheck level. No issues with bleeding noted. Pertinent labs as below:  AST 45; ALT 23; Alk Phos 112 Hgb 15.4; plt 94; PT/INR 20.2/1.7   Goal of Therapy:  aPTT 50-90 seconds Monitor platelets by anticoagulation protocol: Yes   Plan:  Continue argatroban gtt at 1.3 mcg/kg/min Re-check aPTT 4 hours after last check Monitor CBC, aPTT, and hepatic function daily Follow-up HIT studies   Lennie Muckle, PharmD PGY1 Pharmacy Resident 12/21/2023 11:44 AM    ------------------------------------------------ Addendum 12/27 @ 1500  aPTT returned at 51 (therapeutic) on 1.3 mcg/kg/min. No further issues with infusion per RN and no new bleeding noted.  Plan: Continue argatroban gtt at 1.3 mcg/kg/min Check confirmatory aPTT 4 hours after last check Monitor  CBC, aPTT, and hepatic function daily Follow-up HIT studies   Lennie Muckle, PharmD PGY1 Pharmacy Resident 12/21/2023 3:01 PM

## 2023-12-21 NOTE — Hospital Course (Signed)
52 y.o. F with [name], DM, cirrhosis who presented with a few days left leg swelling and pain, as well as SOB and pleuritic chest pain.  In the ER, CTA showed bilateral segmental PE and US showed LLE DVT.  Also, platelets 82K.

## 2023-12-21 NOTE — Progress Notes (Signed)
PHARMACY - ANTICOAGULATION CONSULT NOTE  Pharmacy Consult for Argatroban Indication: Breakthrough DVT on enoxaparin prior to arrival. 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  Allergies  Allergen Reactions   Heparin Other (See Comments)    Per MD order   Morphine Itching    Per pt     Patient Measurements: Height: 5\' 3"  (160 cm) Weight: 71.7 kg (158 lb) IBW/kg (Calculated) : 52.4 Heparin Dosing Weight: 67.4 kg  Vital Signs: Temp: 98.2 F (36.8 C) (12/27 1930) Temp Source: Oral (12/27 1930) BP: 153/70 (12/27 1930) Pulse Rate: 56 (12/27 1930)  Labs: Recent Labs    12/20/23 1231 12/20/23 1613 12/20/23 1613 12/20/23 1657 12/21/23 0315 12/21/23 1019 12/21/23 1408 12/21/23 1937  HGB 17.0*  --   --   --  15.4*  --   --   --   HCT 49.5*  --   --   --  45.2  --   --   --   PLT 82*  --   --   --  94*  --   --   --   APTT  --  33   < >  --  48* 49* 51* 51*  LABPROT  --  14.9  --   --  20.2*  --   --   --   INR  --  1.1  --   --  1.7*  --   --   --   HEPARINUNFRC  --  0.66  --   --   --   --   --   --   CREATININE 0.75  --   --   --  0.76  --   --   --   TROPONINIHS 5  --   --  5  --   --   --   --    < > = values in this interval not displayed.    Estimated Creatinine Clearance: 79.8 mL/min (by C-G formula based on SCr of 0.76 mg/dL).   Medical History: Past Medical History:  Diagnosis Date   Diabetes mellitus without complication (HCC)    Fatty liver    Hyperlipidemia    Protein C deficiency (HCC)    Protein S deficiency (HCC)     Assessment: 20 yof with a history of cirrhosis, protein C/S deficiency, DVT, IVC thrombosis, PE. Patient is presenting with leg pain. Heparin per pharmacy initially ordered. However, now argatroban per pharmacy consult placed for breakthrough DVT on enoxaparin prior to arrival. 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   Previously patient was on warfarin PTA.  Patient is now on enoxaparin 80 mg q12h prior to arrival per chart review.  Medication history notes last dose of 12/26 0700 per patient.  Heparin initially started at 1200 units/hr without a bolus. 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 Patient transitioned to argatroban 12/26 PM.   APTT 51 is at low end of therapeutic on 1.3 mck/kg/min.  No issues with infusion or bleeding per RN. Will increase slightly to target higher level within goal given acute DVT.   Goal of Therapy:  aPTT 50-90 seconds Monitor platelets by anticoagulation protocol: Yes   Plan:  Increase argatroban gtt to 1.35 mcg/kg/min  Monitor CBC, aPTT, and hepatic function daily Follow-up HIT studies from 12/26  Alphia Moh, PharmD, BCPS, BCCP Clinical Pharmacist  Please check AMION for all [facility] Pharmacy phone numbers After 10:00 PM, call Main Pharmacy [phone]

## 2023-12-21 NOTE — Progress Notes (Signed)
Pt refused 3 units of insulin. Stated that her cbg was checked when she just finished eating. MD notified and aware.   Lawson Radar, RN

## 2023-12-21 NOTE — Progress Notes (Signed)
Progress Note   Patient: Shelby Conner ZOX:[id number] DOB: 11/21/1971 DOA: 12/20/2023     1 DOS: the patient was seen and examined on 12/21/2023 at 9:25 AM      Brief hospital course: 52 y.o. F with [name], DM, cirrhosis who presented with a few days left leg swelling and pain, as well as SOB and pleuritic chest pain.  In the ER, CTA showed bilateral segmental PE and US showed LLE DVT.  Also, platelets 82K.     Assessment and Plan: * DVT (deep venous thrombosis) (HCC) Chronic clot in the distal veins.  Acute clot up to the distal common femoral.  Able to walk.  Pain controlled.  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  Other diagnostic considerations include treatment failure or nonadherence (she states not) in the setting of portal hypertensive thrombocytopenia.  Chronicity of thrombocytopenia unknown. - Avoid heparins - Follow heparin antibodies - Continue argatroban infusion, monitoring and dosing per pharmacy - Consult Hematology - Plan for warfarin  - Discussed with Vascular, benefit from thrombectomy very low, no indication at present     Aortic atherosclerosis (HCC) Incidental finding on the CTA   DM2 (diabetes mellitus, type 2) (HCC) Glucose well-controlled - Continue glargine - Continue sliding scale corrections  Acute pulmonary embolism (HCC) Small, mild symptoms, no heart strain, hemodynamically stable - See above  Cirrhosis (HCC) Due Alcohol, MASLD.  INR normal.  Tbili 1.9, albumin 3.1, well compensated.  Hx portal hypertension by imaging.  Does not follow with GI anymore. Not on diuretics.  Thrombocytopenia (HCC) CT from February showed stigmata of portal HTN and varices.  Unclear what the origin of her thrombocytopenia is. - Follow HIT panel - Trend platelets  Macrocytosis, elevated TSH TSH elevated.  Folate and B12 normal. -  Check fT4   Hypercoagulable state (HCC) History of protein C&S deficiency.  Was on warfarin until last February.  At that time her warfarin was held for dental procedure without a bridge, she developed clot of her IVC, and a bad left leg DVT requiring thrombectomy, and also complicated by an RP bleed.  At the time of discharge, she was on Lovenox twice daily, and was recommended to follow-up with hematology with plans to transition to DOAC, but she did not follow up.  She claims to have been on Lovenox BID since then. - Argatroban - Consult hematology  Hyponatremia Mild, asymptomatic - Hold fluids  Hypokalemia Repleted          Subjective: Patient is ambulating to the bathroom independently, she has controlled pain of the left leg.  Chest pain is also relatively controlled.  No bleeding, no skin changes.  No other new concerns.  She has a lot of questions about management and plan going forward.  She has not seen hematology yet.  Nursing have no concerns.     Physical Exam: BP 114/72 (BP Location: Left Arm)   Pulse (!) 55   Temp 98.4 F (36.9 C) (Oral)   Resp 16   Ht 5\' 3"  (1.6 m)   Wt 71.7 kg   SpO2 90%   BMI 27.99 kg/m   Adult female, lying in bed, interactive and appropriate RRR, no murmurs, no peripheral edema Respiratory rate normal, lungs clear without rales or wheezes Abdomen benign, no ascites or distention There is no redness or swelling of the left leg, there is some chronic skin changes/bruising distally,  warm and good pulses Attention normal, affect appropriate, judgment and insight appear normal    Data Reviewed: Discussed with vascular surgery and hematology Basic metabolic panel shows normal renal function and electrolytes CBC shows no leukocytosis.  Hemoglobin 15.4, macrocytic. Platelets up to 94,000 INR 1.7 TSH slightly elevated    Family Communication: husband at the bedside    Disposition: Status is:  Inpatient         Author: Alberteen Sam, MD 12/21/2023 2:16 PM  For on call review [email or web address].

## 2023-12-21 NOTE — Progress Notes (Signed)
PHARMACY - ANTICOAGULATION CONSULT NOTE  Pharmacy Consult for Argatroban Indication: Breakthrough DVT on enoxaparin prior to arrival. 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  Allergies  Allergen Reactions   Heparin Other (See Comments)    Per MD order   Morphine Itching    Per pt     Patient Measurements: Height: 5\' 3"  (160 cm) Weight: 71.7 kg (158 lb) IBW/kg (Calculated) : 52.4 Heparin Dosing Weight: 67.4 kg  Vital Signs: Temp: 98.1 F (36.7 C) (12/27 0321) Temp Source: Oral (12/27 0321) BP: 133/82 (12/27 0321) Pulse Rate: 55 (12/27 0321)  Labs: Recent Labs    12/20/23 1231 12/20/23 1613 12/20/23 1657 12/21/23 0315  HGB 17.0*  --   --  15.4*  HCT 49.5*  --   --  45.2  PLT 82*  --   --  94*  APTT  --  33  --  48*  LABPROT  --  14.9  --  20.2*  INR  --  1.1  --  1.7*  HEPARINUNFRC  --  0.66  --   --   CREATININE 0.75  --   --  0.76  TROPONINIHS 5  --  5  --     Estimated Creatinine Clearance: 79.8 mL/min (by C-G formula based on SCr of 0.76 mg/dL).   Medical History: Past Medical History:  Diagnosis Date   Diabetes mellitus without complication (HCC)    Fatty liver    Hyperlipidemia    Protein C deficiency (HCC)    Protein S deficiency (HCC)     Assessment: 18 yof with a history of cirrhosis, protein C/S deficiency, DVT, IVC thrombosis, PE. Patient is presenting with leg pain. Heparin per pharmacy initially ordered. However, now argatroban per pharmacy consult placed for breakthrough DVT on enoxaparin prior to arrival. 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   Previously patient was on warfarin PTA.  Patient is now on enoxaparin 80 mg q12h prior to arrival per chart review. Medication history notes last dose of 12/26 0700 per patient.  Heparin initially started at 1200 units/hr without a bolus.  AST 45; ALT 27; Alk Phos 121 Hgb 17; plt 82; PT/INR 14.9/1.1; aPTT 33 Heparin level 0.66 (NOT LMWH level -- on enoxaparin prior to arrival)  12/27 AM update:   aPTT sub-therapeutic  Goal of Therapy:  aPTT 50-90 seconds Monitor platelets by anticoagulation protocol: Yes   Plan:  Inc argatroban to 1.3 mcg/kg/min Re-check aPTT in 4 hours  Abran Duke, PharmD, BCPS Clinical Pharmacist Phone: [phone]

## 2023-12-22 ENCOUNTER — Inpatient Hospital Stay (HOSPITAL_COMMUNITY): Payer: BC Managed Care – PPO

## 2023-12-22 DIAGNOSIS — I82412 Acute embolism and thrombosis of left femoral vein: Secondary | ICD-10-CM | POA: Diagnosis not present

## 2023-12-22 LAB — GLUCOSE, CAPILLARY
Glucose-Capillary: 129 mg/dL — ABNORMAL HIGH (ref 70–99)
Glucose-Capillary: 144 mg/dL — ABNORMAL HIGH (ref 70–99)
Glucose-Capillary: 112 mg/dL — ABNORMAL HIGH (ref 70–99)
Glucose-Capillary: 108 mg/dL — ABNORMAL HIGH (ref 70–99)

## 2023-12-22 LAB — PROTIME-INR
INR: 1.9 — ABNORMAL HIGH (ref 0.8–1.2)
Prothrombin Time: 22.3 s — ABNORMAL HIGH (ref 11.4–15.2)

## 2023-12-22 LAB — COMPREHENSIVE METABOLIC PANEL
ALT: 35 U/L (ref 0–44)
AST: 65 U/L — ABNORMAL HIGH (ref 15–41)
Albumin: 3.3 g/dL — ABNORMAL LOW (ref 3.5–5.0)
Alkaline Phosphatase: 129 U/L — ABNORMAL HIGH (ref 38–126)
Anion gap: 9 (ref 5–15)
BUN: <5 mg/dL — ABNORMAL LOW (ref 6–20)
CO2: 22 mmol/L (ref 22–32)
Calcium: 8.9 mg/dL (ref 8.9–10.3)
Chloride: 104 mmol/L (ref 98–111)
Creatinine, Ser: 0.73 mg/dL (ref 0.44–1.00)
GFR, Estimated: >60 mL/min (ref >60)
Glucose, Bld: 118 mg/dL — ABNORMAL HIGH (ref 70–99)
Potassium: 4 mmol/L (ref 3.5–5.1)
Sodium: 135 mmol/L (ref 135–145)
Total Bilirubin: 1.5 mg/dL — ABNORMAL HIGH (ref <1.2)
Total Protein: 7.3 g/dL (ref 6.5–8.1)

## 2023-12-22 LAB — CBC
HCT: 45.9 % (ref 36.0–46.0)
Hemoglobin: 15.9 g/dL — ABNORMAL HIGH (ref 12.0–15.0)
MCH: 36.4 pg — ABNORMAL HIGH (ref 26.0–34.0)
MCHC: 34.6 g/dL (ref 30.0–36.0)
MCV: 105 fL — ABNORMAL HIGH (ref 80.0–100.0)
Platelets: 93 10*3/uL — ABNORMAL LOW (ref 150–400)
RBC: 4.37 MIL/uL (ref 3.87–5.11)
RDW: 15 % (ref 11.5–15.5)
WBC: 6.1 10*3/uL (ref 4.0–10.5)
nRBC: 0 % (ref 0.0–0.2)

## 2023-12-22 LAB — HEPARIN INDUCED PLATELET AB (HIT ANTIBODY): Heparin Induced Plt Ab: 0.112 {OD_unit} (ref 0.000–0.400)

## 2023-12-22 LAB — T4, FREE: Free T4: 0.93 ng/dL (ref 0.61–1.12)

## 2023-12-22 LAB — APTT: aPTT: 52 s — ABNORMAL HIGH (ref 24–36)

## 2023-12-22 NOTE — Progress Notes (Signed)
She is doing well this morning.  She had no problems overnight.  Continues on the argatroban.  She has less pain in the left thigh.  There is no chest pain.  There is no shortness of breath.  She has little bit of a dry cough.  Her platelet count is 93,000.  Something tells me that this just might be a new "baseline" for given that she has cirrhosis.  I think be worthwhile checking a ultrasound of her liver and spleen.  She may have splenomegaly secondary to the cirrhosis.  She had no bleeding.  There is no problem with bowels or bladder.  Her liver test I think are okay.  Her albumin is 3.3.-This is a decent indicator for hepatic function.  I will keep her on argatroban over the weekend.  We will then see obvious which are over 2.  I think a lot will depen her insurance.  We can try Arixtra which is daily subcutaneous.  We could try  Pradaxa which is a direct thrombin inhibitor which is oral and twice a day.  Her vital signs are all stable.  Temperature 98.3.  Pulse 70.  Blood pressure 152/76.  Her lungs sound clear bilaterally.  She has good air movement.  There is no wheezing.  Cardiac exam regular rate and rhythm.  Abdomen is soft.  I cannot palpate any splenomegaly.  There is no hepatomegaly.  Extremities shows really no swelling in the legs.  Neurological exam is nonfocal.  She has protein C and protein S deficiency.  I think be worthwhile checking these to see how low they really are.   Christin Bach, MD  1 Cor 15:58

## 2023-12-22 NOTE — Progress Notes (Signed)
  Progress Note   Patient: Shelby Conner MVH:[id number] DOB: Sep 26, 1971 DOA: 12/20/2023     2 DOS: the patient was seen and examined on 12/22/2023 at 10:19 AM      Brief hospital course: 52 y.o. F with [name], DM, cirrhosis who presented with a few days left leg swelling and pain, as well as SOB and pleuritic chest pain.  In the ER, CTA showed bilateral segmental PE and US showed LLE DVT.  Also, platelets 82K.     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 - Follow HIT panel - Trend platelets  Macrocytosis, subclinical hypothyroidism TSH elevated, fT4 normal.  Folate and B12 normal. - Follow up TSH and fT4 with PCP in 4 weeks            Subjective: No new complaints, no nursing concerns other than possible IV infiltration.     Physical Exam: BP (!) 134/94 (BP Location: Left Arm)   Pulse 72   Temp 98.4 F (36.9 C) (Oral)   Resp 15   Ht 5\' 3"  (1.6 m)   Wt 71.7 kg   SpO2 96%   BMI 27.99 kg/m   Adult female, sitting up in bed, interactive and appropriate RRR, no murmurs, no peripheral edema Respiratory rate normal, lungs clear without rales or wheezes Attention normal, affect normal, judgment and insight appear normal    Data Reviewed: Abdomen ultrasound reviewed shows fatty liver Comprehensive metabolic panel no change, total bilirubin 1.5, AST 65 Platelets 93, no change on CBC, hemoglobin stable Free T4  normal  Family Communication: None present    Disposition: Status is: Inpatient         Author: Alberteen Sam, MD 12/22/2023 4:43 PM  For on call review [email or web address].

## 2023-12-22 NOTE — Progress Notes (Signed)
Requested IV team consult requesting IV access d/t hx of difficult start and with current LFA PIV with argatroban running is getting sore.   IV team at bedside, spoke with this nurse and the patient.  IV team decided to re-dress current IV access.   This nurse noted IV pump alarming and argatroban capped off.

## 2023-12-22 NOTE — Progress Notes (Signed)
Patient's RN put in the consult for placing a PIV access. Assessed the PIV access on Lt. Arm which has good blood return. Patient was c/o soreness on IV insertion area. Explained patient that PIV access is right place, but if continuous sore, then ask for new PIV access. Informed patient's RN regarding this. HS [employer]

## 2023-12-22 NOTE — Plan of Care (Signed)

## 2023-12-22 NOTE — Progress Notes (Signed)
Pharmacy notified of problems with her IV today that is running her Argatroban.1. IV unhooked for an hour. 2.  alarming for occlusions and air.

## 2023-12-22 NOTE — Progress Notes (Addendum)
PHARMACY - ANTICOAGULATION CONSULT NOTE  Pharmacy Consult for Argatroban Indication: Breakthrough DVT on enoxaparin prior to arrival. 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  Allergies  Allergen Reactions   Heparin Other (See Comments)    Per MD order   Morphine Itching    Per pt     Patient Measurements: Height: 5\' 3"  (160 cm) Weight: 71.7 kg (158 lb) IBW/kg (Calculated) : 52.4 Heparin Dosing Weight: 67.4 kg  Vital Signs: Temp: 98.3 F (36.8 C) (12/28 0753) Temp Source: Oral (12/28 0753) BP: 139/93 (12/28 0753) Pulse Rate: 81 (12/28 0753)  Labs: Recent Labs    12/20/23 1231 12/20/23 1231 12/20/23 1613 12/20/23 1657 12/21/23 0315 12/21/23 1019 12/21/23 1408 12/21/23 1937 12/22/23 0258  HGB 17.0*  --   --   --  15.4*  --   --   --  15.9*  HCT 49.5*  --   --   --  45.2  --   --   --  45.9  PLT 82*  --   --   --  94*  --   --   --  93*  APTT  --    < > 33  --  48*   < > 51* 51* 52*  LABPROT  --   --  14.9  --  20.2*  --   --   --  22.3*  INR  --   --  1.1  --  1.7*  --   --   --  1.9*  HEPARINUNFRC  --   --  0.66  --   --   --   --   --   --   CREATININE 0.75  --   --   --  0.76  --   --   --  0.73  TROPONINIHS 5  --   --  5  --   --   --   --   --    < > = values in this interval not displayed.    Estimated Creatinine Clearance: 79.8 mL/min (by C-G formula based on SCr of 0.73 mg/dL).   Medical History: Past Medical History:  Diagnosis Date   Diabetes mellitus without complication (HCC)    Fatty liver    Hyperlipidemia    Protein C deficiency (HCC)    Protein S deficiency (HCC)     Assessment: 66 yof with a history of cirrhosis, protein C/S deficiency, DVT, IVC thrombosis, PE. Patient is presenting with leg pain. Heparin per pharmacy initially ordered. However, now argatroban per pharmacy consult placed for breakthrough DVT on enoxaparin prior to arrival. 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   Previously patient was on warfarin PTA. Patient is now on  enoxaparin 80 mg q12h prior to arrival per chart review. Medication history notes last dose of 12/26 0700 per patient.  Heparin initially started at 1200 units/hr without a bolus. 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 Patient transitioned to argatroban 12/26 PM.   This morning, aPTT 52 is at low end of therapeutic on 1.35 mck/kg/min.  No issues with infusion or bleeding per RN. Will increase slightly to target higher level within goal given acute DVT.   Goal of Therapy:  aPTT 50-90 seconds Monitor platelets by anticoagulation protocol: Yes   Plan:  Increase argatroban gtt to 1.4 mcg/kg/min  Monitor CBC, aPTT, and hepatic function daily  Roslyn Smiling, PharmD PGY1 Pharmacy Resident 12/22/2023 8:37 AM

## 2023-12-23 DIAGNOSIS — I82491 Acute embolism and thrombosis of other specified deep vein of right lower extremity: Secondary | ICD-10-CM | POA: Diagnosis not present

## 2023-12-23 DIAGNOSIS — I2699 Other pulmonary embolism without acute cor pulmonale: Secondary | ICD-10-CM | POA: Diagnosis not present

## 2023-12-23 DIAGNOSIS — D75829 Heparin-induced thrombocytopenia, unspecified: Secondary | ICD-10-CM | POA: Diagnosis not present

## 2023-12-23 LAB — COMPREHENSIVE METABOLIC PANEL
ALT: 45 U/L — ABNORMAL HIGH (ref 0–44)
AST: 70 U/L — ABNORMAL HIGH (ref 15–41)
Albumin: 3.3 g/dL — ABNORMAL LOW (ref 3.5–5.0)
Alkaline Phosphatase: 123 U/L (ref 38–126)
Anion gap: 9 (ref 5–15)
BUN: 6 mg/dL (ref 6–20)
CO2: 25 mmol/L (ref 22–32)
Calcium: 9.2 mg/dL (ref 8.9–10.3)
Chloride: 102 mmol/L (ref 98–111)
Creatinine, Ser: 0.74 mg/dL (ref 0.44–1.00)
GFR, Estimated: >60 mL/min (ref >60)
Glucose, Bld: 96 mg/dL (ref 70–99)
Potassium: 4.1 mmol/L (ref 3.5–5.1)
Sodium: 136 mmol/L (ref 135–145)
Total Bilirubin: 1.7 mg/dL — ABNORMAL HIGH (ref <1.2)
Total Protein: 7.4 g/dL (ref 6.5–8.1)

## 2023-12-23 LAB — GLUCOSE, CAPILLARY
Glucose-Capillary: 91 mg/dL (ref 70–99)
Glucose-Capillary: 141 mg/dL — ABNORMAL HIGH (ref 70–99)
Glucose-Capillary: 122 mg/dL — ABNORMAL HIGH (ref 70–99)
Glucose-Capillary: 124 mg/dL — ABNORMAL HIGH (ref 70–99)

## 2023-12-23 LAB — CBC
HCT: 45.2 % (ref 36.0–46.0)
Hemoglobin: 15.4 g/dL — ABNORMAL HIGH (ref 12.0–15.0)
MCH: 35.9 pg — ABNORMAL HIGH (ref 26.0–34.0)
MCHC: 34.1 g/dL (ref 30.0–36.0)
MCV: 105.4 fL — ABNORMAL HIGH (ref 80.0–100.0)
Platelets: 109 10*3/uL — ABNORMAL LOW (ref 150–400)
RBC: 4.29 MIL/uL (ref 3.87–5.11)
RDW: 15 % (ref 11.5–15.5)
WBC: 5.8 10*3/uL (ref 4.0–10.5)
nRBC: 0 % (ref 0.0–0.2)

## 2023-12-23 LAB — APTT: aPTT: 55 s — ABNORMAL HIGH (ref 24–36)

## 2023-12-23 LAB — PROTIME-INR
INR: 2.1 — ABNORMAL HIGH (ref 0.8–1.2)
Prothrombin Time: 23.9 s — ABNORMAL HIGH (ref 11.4–15.2)

## 2023-12-23 MED ORDER — FONDAPARINUX SODIUM 7.5 MG/0.6ML ~~LOC~~ SOLN
7.5000 mg | SUBCUTANEOUS | Status: DC
  Administered 2023-12-23: 7.5 mg via SUBCUTANEOUS
  Filled 2023-12-23 (×2): qty 0.6

## 2023-12-23 MED ORDER — DIPHENHYDRAMINE HCL 25 MG PO CAPS
25.0000 mg | ORAL_CAPSULE | ORAL | Status: DC | PRN
  Administered 2023-12-23 – 2023-12-24 (×4): 25 mg via ORAL
  Filled 2023-12-23 (×4): qty 1

## 2023-12-23 MED ORDER — GUAIFENESIN-DM 100-10 MG/5ML PO SYRP
5.0000 mL | ORAL_SOLUTION | ORAL | Status: DC | PRN
  Administered 2023-12-24: 5 mL via ORAL
  Filled 2023-12-23: qty 5

## 2023-12-23 NOTE — Progress Notes (Signed)
PHARMACY - ANTICOAGULATION CONSULT NOTE  Pharmacy Consult for switching from Argatroban to Fondaparinux Indication: Breakthrough DVT on enoxaparin prior to arrival. Work-up for HIT negative.   Allergies  Allergen Reactions   Heparin Other (See Comments)    Per MD order   Morphine Itching    Per pt     Patient Measurements: Height: 5\' 3"  (160 cm) Weight: 71.7 kg (158 lb) IBW/kg (Calculated) : 52.4  Vital Signs: Temp: 98.3 F (36.8 C) (12/29 0334) Temp Source: Oral (12/29 0334) BP: 121/70 (12/29 0334) Pulse Rate: 56 (12/29 0334)  Labs: Recent Labs    12/20/23 1231 12/20/23 1613 12/20/23 1613 12/20/23 1657 12/21/23 0315 12/21/23 1019 12/21/23 1937 12/22/23 0258 12/23/23 0227  HGB 17.0*  --   --   --  15.4*  --   --  15.9* 15.4*  HCT 49.5*  --   --   --  45.2  --   --  45.9 45.2  PLT 82*  --   --   --  94*  --   --  93* 109*  APTT  --  33   < >  --  48*   < > 51* 52* 55*  LABPROT  --  14.9   < >  --  20.2*  --   --  22.3* 23.9*  INR  --  1.1   < >  --  1.7*  --   --  1.9* 2.1*  HEPARINUNFRC  --  0.66  --   --   --   --   --   --   --   CREATININE 0.75  --   --   --  0.76  --   --  0.73 0.74  TROPONINIHS 5  --   --  5  --   --   --   --   --    < > = values in this interval not displayed.    Estimated Creatinine Clearance: 79.8 mL/min (by C-G formula based on SCr of 0.74 mg/dL).   Medical History: Past Medical History:  Diagnosis Date   Diabetes mellitus without complication (HCC)    Fatty liver    Hyperlipidemia    Protein C deficiency (HCC)    Protein S deficiency (HCC)     Assessment: 79 yof with a history of cirrhosis, protein C/S deficiency, DVT, IVC thrombosis, PE. Patient is presenting with leg pain. Heparin per pharmacy initially ordered. However, now argatroban per pharmacy consult placed for breakthrough DVT on enoxaparin prior to arrival. 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   Previously patient was on warfarin PTA. Patient is now on enoxaparin  80 mg q12h prior to arrival per chart review. Medication history notes last dose of 12/26 0700 per patient. Heparin initially started at 1200 units/hr without a bolus. 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 Patient transitioned to argatroban 12/26 PM.   aPTT was therapeutic at 55 (goal aPTT 50-90 seconds) this morning with argatroban gtt at 1.4 mcg/kg/min. Work-up for HIT negative (Heparin induced platelet Ab (HIT antibody 0.112) from 12/26. Pharmacy consulted to switch patient from argatroban to fondaparinux. CBC stable - hgb 15.4 and plts up to 109 from 93.  Dosing for fondaparinux based on 50-100 kg weight range (patient is 71.7 kg).   Goal of Therapy:  Monitor platelets by anticoagulation protocol: Yes   Plan:  Stop argatroban  Administer fondaparinux 7.5 mg subQ once daily  Monitor CBC daily Follow-up  on cost tomorrow (Monday) for fondaparinux vs dabigatran   Roslyn Smiling, PharmD PGY1 Pharmacy Resident 12/23/2023 7:49 AM

## 2023-12-23 NOTE — Progress Notes (Signed)
°  Progress Note   Patient: Shelby Conner AOZ:[id number] DOB: January 24, 1971 DOA: 12/20/2023     3 DOS: the patient was seen and examined on 12/23/2023 at 9:28AM      Brief hospital course: 52 y.o. F with [name], DM, cirrhosis who presented with a few days left leg swelling and pain, as well as SOB and pleuritic chest pain.  In the ER, CTA showed bilateral segmental PE and US showed LLE DVT.  Also, platelets 82K.     Assessment and Plan: * DVT (deep venous thrombosis) (HCC) Acute bilateral pulmonary embolism Protein C&S deficiency -Stop argatroban - Start fondaparinux - Consult hematology - Plan to get pharmacy benefits check tomorrow and if fondaparinux is covered by her insurance, likely discharge tomorrow  Heparin-induced thrombocytopenia, ruled out HIT panel negative   Cirrhosis (HCC) Thrombocytopenia Splenomegaly Appears overall compensated, child Pugh A, MELD low  DM2 (diabetes mellitus, type 2) (HCC) Glucose well-controlled - Continue glargine - Continue sliding scale corrections  Subclinical hypothyroidism TSH elevated, fT4 normal.  Folate and B12 normal. - Follow up TSH and fT4 with PCP in 4 weeks           Subjective: Patient has some pain in her left leg, mostly the groin.  No swelling.  No chest pain, no dyspnea.     Physical Exam: BP 120/88 (BP Location: Right Arm)   Pulse 80   Temp 98.3 F (36.8 C) (Oral)   Resp 16   Ht 5\' 3"  (1.6 m)   Wt 71.7 kg   SpO2 97%   BMI 27.99 kg/m   Adult female, sitting up in bed, interactive and appropriate RRR, no murmurs, no peripheral edema Respiratory rate normal, lungs clear without rales or wheezes Attention normal, affect normal, judgment and insight appear normal   Data Reviewed: PTT within the therapeutic range, platelets improving to 109 HIT panel negative LFTs slightly elevated from yesterday   Family Communication: None present    Disposition: Status is:  Inpatient         Author: Alberteen Sam, MD 12/23/2023 4:26 PM  For on call review [email or web address].

## 2023-12-24 ENCOUNTER — Other Ambulatory Visit (HOSPITAL_COMMUNITY): Payer: Self-pay

## 2023-12-24 ENCOUNTER — Telehealth (HOSPITAL_COMMUNITY): Payer: Self-pay | Admitting: Pharmacy Technician

## 2023-12-24 DIAGNOSIS — I82491 Acute embolism and thrombosis of other specified deep vein of right lower extremity: Secondary | ICD-10-CM | POA: Diagnosis not present

## 2023-12-24 DIAGNOSIS — I82412 Acute embolism and thrombosis of left femoral vein: Secondary | ICD-10-CM | POA: Diagnosis not present

## 2023-12-24 LAB — PROTEIN C ACTIVITY: Protein C Activity: 42 % — ABNORMAL LOW (ref 73–180)

## 2023-12-24 LAB — CBC
HCT: 45.3 % (ref 36.0–46.0)
Hemoglobin: 15.6 g/dL — ABNORMAL HIGH (ref 12.0–15.0)
MCH: 36.1 pg — ABNORMAL HIGH (ref 26.0–34.0)
MCHC: 34.4 g/dL (ref 30.0–36.0)
MCV: 104.9 fL — ABNORMAL HIGH (ref 80.0–100.0)
Platelets: 128 10*3/uL — ABNORMAL LOW (ref 150–400)
RBC: 4.32 MIL/uL (ref 3.87–5.11)
RDW: 15 % (ref 11.5–15.5)
WBC: 5.2 10*3/uL (ref 4.0–10.5)
nRBC: 0 % (ref 0.0–0.2)

## 2023-12-24 LAB — GLUCOSE, CAPILLARY: Glucose-Capillary: 102 mg/dL — ABNORMAL HIGH (ref 70–99)

## 2023-12-24 LAB — PROTEIN S ACTIVITY: Protein S Activity: >280 % — ABNORMAL HIGH (ref 63–140)

## 2023-12-24 LAB — PROTEIN S, TOTAL: Protein S Ag, Total: 75 % (ref 60–150)

## 2023-12-24 LAB — PROTEIN C, TOTAL: Protein C, Total: 52 % — ABNORMAL LOW (ref 60–150)

## 2023-12-24 MED ORDER — DABIGATRAN ETEXILATE MESYLATE 150 MG PO CAPS
150.0000 mg | ORAL_CAPSULE | Freq: Two times a day (BID) | ORAL | 0 refills | Status: DC
  Filled 2023-12-24: qty 60, 30d supply, fill #0

## 2023-12-24 MED ORDER — DABIGATRAN ETEXILATE MESYLATE 150 MG PO CAPS
150.0000 mg | ORAL_CAPSULE | Freq: Two times a day (BID) | ORAL | Status: DC
  Administered 2023-12-24: 150 mg via ORAL
  Filled 2023-12-24: qty 1

## 2023-12-24 NOTE — Plan of Care (Signed)

## 2023-12-24 NOTE — Progress Notes (Signed)
Explained discharge instructions to patient. Reviewed follow up appointment and next medication administration times. Also reviewed education. Patient verbalized having an understanding for instructions given. All belongings are in the patient's possession. Will pick up TOC meds. IV was removed. No other needs verbalized. Transporting downstairs to discharge lounge to await her pick up.

## 2023-12-24 NOTE — Progress Notes (Signed)
PHARMACY - ANTICOAGULATION CONSULT NOTE  Pharmacy Consult for Pradaxa Indication: Breakthrough DVT on Lovenox  Allergies  Allergen Reactions   Heparin Other (See Comments)    Per MD order   Morphine Itching    Per pt     Patient Measurements: Height: 5\' 3"  (160 cm) Weight: 71.7 kg (158 lb) IBW/kg (Calculated) : 52.4  Vital Signs: Temp: 98.2 F (36.8 C) (12/29 2321) Temp Source: Oral (12/29 2321) BP: 126/81 (12/29 2321) Pulse Rate: 70 (12/29 2321)  Labs: Recent Labs    12/21/23 1937 12/22/23 0258 12/22/23 0258 12/23/23 0227 12/24/23 0255  HGB  --  15.9*   < > 15.4* 15.6*  HCT  --  45.9  --  45.2 45.3  PLT  --  93*  --  109* 128*  APTT 51* 52*  --  55*  --   LABPROT  --  22.3*  --  23.9*  --   INR  --  1.9*  --  2.1*  --   CREATININE  --  0.73  --  0.74  --    < > = values in this interval not displayed.    Estimated Creatinine Clearance: 79.8 mL/min (by C-G formula based on SCr of 0.74 mg/dL).   Medical History: Past Medical History:  Diagnosis Date   Diabetes mellitus without complication (HCC)    Fatty liver    Hyperlipidemia    Protein C deficiency (HCC)    Protein S deficiency (HCC)     Assessment: 52 y/o F with breakthrough DVT while on Lovenox therapy. Pt was initially on Argatroban therapy here. HIT was ruled out. Pt was then changed to Fondaparinux. Hematology would like to switch the patient to Pradaxa.   Goal of Therapy:  Monitor platelets by anticoagulation protocol: Yes   Plan:  DC Fondaparinux  Start Pradaxa 150 mg BID  Monitor for bleeding  Abran Duke, PharmD, BCPS Clinical Pharmacist Phone: [phone]

## 2023-12-24 NOTE — Progress Notes (Signed)
She is doing pretty well.  She is off heparin now.  As expected, the HITT panel was negative.  I really think that we could try her on Pradaxa.  Currently, she is on Arixtra.  It would be nice to get her off subcutaneous treatments for right now.  Her platelet count is 128,000.  Her white cell count is 5.2.  Hemoglobin 15.6.  Yesterday, her LFTs were on the higher side.  SGPT 45 SGOT 70.  Her bilirubin was 1.7.  She has had no bleeding.  The pain in the left hip is better.  She has had no chest wall pain.  She has little bit of a dry cough.  She has had no nausea or vomiting.  She is ambulating.  She has little bit of swelling in the left leg.  I suspect this may be a problem chronically.  She may need to have a compression stocking for the left leg.  She has had no fever.  There has been no rashes.  Her vital signs are stable.  Temperature 98.2.  Pulse 70.  Blood pressure 126/81.  Her lungs are clear bilaterally.  I hear no wheezes.  Cardiac exam regular rate and rhythm.  Abdomen is soft.  Bowel sounds are present.  There is no fluid wave.  Extremity shows really no edema in the legs.  She has no palpable venous cord.  Neurological exam is nonfocal.  Again, I would probably switch her to Pradaxa.  We will have her on full dose Pradaxa.  I do not see a reason why we cannot use Pradaxa.  His mechanism of action is different than what she has had before.  Hopefully, insurance will cover this.  Once we can get insurance guarantee on the Pradaxa, then sure she can probably discharged and followed up as an outpatient.  I know she has had great care from everybody upon 4 E.  Christin Bach, MD  2 Cor 5:7

## 2023-12-24 NOTE — Discharge Instructions (Signed)
Information on my medicine - Pradaxa (dabigatran)  This medication education was reviewed with me or my healthcare representative as part of my discharge preparation.    Why was Pradaxa prescribed for you? Pradaxa was prescribed to treat blood clots that may have been found in the veins of your legs (deep vein thrombosis) or in your lungs (pulmonary embolism) and to reduce the risk of them occurring again.  Pradaxa will take the place of the injectable anticoagulant medication you have been receiving for this condition for the last 5-10 days.  What do you Need to know about PradAXa? Take your Pradaxa TWICE DAILY - one capsule in the morning and one capsule in the evening with or without food.  It would be best to take the doses about the same time each day.  The capsules should not be broken, chewed or opened - they must be swallowed whole.  Do not store Pradaxa in other medication containers.  Keep it in its original medication bottle.  Once the bottle is opened the Pradaxa should be used within FOUR months; throw away any capsules that haven't been by that time.  Take Pradaxa exactly as prescribed by your doctor.  DO NOT stop taking Pradaxa without talking to the doctor who prescribed the medication.  Refill your prescription before you run out.  After discharge, you should have regular check-up appointments with your healthcare provider that is prescribing your Pradaxa.  In the future your dose may need to be changed if your kidney function or weight changes by a significant amount.  What do you do if you miss a dose? If you miss a dose, take it as soon as you remember on the same day.  If your next dose is less than 6 hours away, skip the missed dose.  Do not take two doses of PRADAXA at the same time.  Important Safety Information A possible side effect of Pradaxa is bleeding. You should call your healthcare provider right away if you experience any of the following: Bleeding  from an injury or your nose that does not stop. Unusual colored urine (red or dark brown) or unusual colored stools (red or black). Unusual bruising for unknown reasons. A serious fall or if you hit your head (even if there is no bleeding).  Some medicines may interact with Pradaxa and might increase your risk of bleeding or clotting while on Pradaxa. To help avoid this, consult your healthcare provider or pharmacist prior to using any new prescription or non-prescription medications, including herbals, vitamins, non-steroidal anti-inflammatory drugs (NSAIDs) and supplements.  This website has more information on Pradaxa (dabigatran): www.HDTVGame.dk.

## 2023-12-24 NOTE — Discharge Summary (Signed)
Physician Discharge Summary   Patient: Shelby Conner MRN: [id number] DOB: Jul 20, 1971  Admit date:     12/20/2023  Discharge date: 12/24/23  Discharge Physician: Alberteen Sam   PCP: Joaquin Music, NP     Recommendations at discharge:  Follow up with Hematology Dr. Myna Hidalgo for protein C deficiency and new VTE Follow up with PCP Joaquin Music for new DVT and PE Joaquin Music: Please Check BMP and CBC in 1 week Please refer to Gastroenterology/Hepatology for cirrhosis follow up Please cehck TSH in 4-6 weeks for subclinical hypothyroidism     Discharge Diagnoses: Principal Problem:   DVT (deep venous thrombosis) (HCC) Active Problems:   Acute pulmonary embolism (HCC)   Heparin induced thrombocytopenia, ruled out   Hypokalemia   Hyponatremia   Hypercoagulable state, protein C deficiency   Subclinical hypothyroidism   Thrombocytopenia (HCC)   Cirrhosis (HCC)   DM2 (diabetes mellitus, type 2) (HCC)   Aortic atherosclerosis [facility])      Hospital Course: 52 y.o. F with [name], DM, cirrhosis who presented with a few days left leg swelling and pain, as well as SOB and pleuritic chest pain.  In the ER, CTA showed bilateral segmental PE and US showed LLE DVT.  Also, platelets 82K.       * DVT (deep venous thrombosis) left lower extremity (HCC) Acute pulmonary embolism Chronic clot in the distal veins.  Acute clot up to the distal common femoral.  Able to walk.  Pain controlled.  No phlegmasia.  PE without symptoms or hemodynamic changes.  There was initial concern for HIT given her thrombocytopenia, and so she was started on argatroban.  HIT panel returned negative and so she was changed to Arixtra, treated for 5 days and discharged on new dabigatran.  Per benefits check, dabigatran with low copay.  Hematology follow up arranged prior to discharge.      Aortic atherosclerosis (HCC) Incidental finding on the CTA   DM2 (diabetes mellitus, type  2) (HCC) On glargine  Cirrhosis (HCC) Thrombocytopenia Due MASLD and alcohol.  Not on diuretics.  Child Pugh A, MELD low.     Subclinical hypothyroidism TSH elevated, fT4 normal.  Folate and B12 normal. - Follow up TSH and fT4 with PCP in 4 weeks               The [facility] Controlled Substances Registry was reviewed for this patient prior to discharge.  Consultants: Hematology Dr. Myna Hidalgo Procedures performed: None  Disposition: Home Diet recommendation:  Regular diet  DISCHARGE MEDICATION: Allergies as of 12/24/2023       Reactions   Morphine Itching   Per pt         Medication List     STOP taking these medications    enoxaparin 80 MG/0.8ML injection Commonly known as: LOVENOX       TAKE these medications    dabigatran 150 MG Caps capsule Commonly known as: PRADAXA Take 1 capsule (150 mg total) by mouth every 12 (twelve) hours.   POTASSIMIN PO Take 1 tablet by mouth every evening.   Evaristo Bury FlexTouch 100 UNIT/ML FlexTouch Pen Generic drug: insulin degludec Inject 26 Units into the skin daily.        Follow-up Information     Joaquin Music, NP. Schedule an appointment as soon as possible for a visit in 1 week(s).   Specialty: Nurse Practitioner Contact information: [address] [place] [address] [phone]  Josph Macho, MD. Schedule an appointment as soon as possible for a visit.   Specialty: Oncology Contact information: [address] [place] [address] [phone]                 Discharge Instructions     Ambulatory referral to Hematology / Oncology   Complete by: As directed    Appointment with Dr. Myna Hidalgo, any location, including [facility]   Discharge instructions   Complete by: As directed    **IMPORTANT DISCHARGE INSTRUCTIONS**   From Dr. Maryfrances Bunnell: You were admitted for blood clots.  You have a clot in the left thigh, and small  clots bilaterally in the lungs.  Here, we ruled out heparin induced thrombocytopenia (HIT)  We suspect your low platelets are from portal hypertension from cirrhosis  Have Joaquin Music recheck your blood levels at your follow up appointment I recommend you reestablish care with a liver specialist, either [facility] in [place], or another practice at an academic center (including Annamarie Major, NP, who is a hepatology specialist from [facility] whose clinic is in [place])  Go see Dr. Myna Hidalgo in the office  Take dabigatran/Pradaxa 150 mg twice daily instead of the Lovenox  Resume your home insulin   Increase activity slowly   Complete by: As directed        Discharge Exam: Filed Weights   12/20/23 1221  Weight: 71.7 kg    General: Pt is alert, awake, not in acute distress Cardiovascular: RRR, nl S1-S2, no murmurs appreciated.   No LE edema.   Respiratory: Normal respiratory rate and rhythm.  CTAB without rales or wheezes. Abdominal: Abdomen soft and non-tender.  No distension or HSM.   Neuro/Psych: Strength symmetric in upper and lower extremities.  Judgment and insight appear normal.   Condition at discharge: fair  The results of significant diagnostics from this hospitalization (including imaging, microbiology, ancillary and laboratory) are listed below for reference.   Imaging Studies: US Abdomen Complete Result Date: 12/22/2023 CLINICAL DATA:  Thrombocytopenia. EXAM: ABDOMEN ULTRASOUND COMPLETE COMPARISON:  None Available. FINDINGS: Gallbladder: Several tiny gallstones noted. No evidence of gallbladder wall thickening or pericholecystic fluid. No sonographic Murphy sign noted by sonographer. Common bile duct: Diameter: 2 mm, within normal limits Liver: No focal lesion identified. Diffusely increased parenchymal echogenicity, consistent with diffuse hepatocellular disease. Mild capsular nodularity is suggestive of cirrhosis. Portal vein is patent on color Doppler  imaging with normal direction of blood flow towards the liver. IVC: No abnormality visualized. Pancreas: Visualized portion unremarkable. Spleen: Borderline splenomegaly measuring 12 cm in length, and estimated volume of 394 mL. Right Kidney: Length: 10.8 cm. Echogenicity within normal limits. No mass or hydronephrosis visualized. Left Kidney: Length: 12.6 cm. Echogenicity within normal limits. No mass or hydronephrosis visualized. Abdominal aorta: No aneurysm visualized. Other findings: None. IMPRESSION: Diffuse hepatocellular disease, possible cirrhosis. No focal liver lesion identified. Borderline splenomegaly. Cholelithiasis. No radiographic evidence of cholecystitis or biliary ductal dilatation. Electronically Signed   By: Danae Orleans M.D.   On: 12/22/2023 13:30   CT Angio Chest PE W/Cm &/Or Wo Cm Result Date: 12/20/2023 CLINICAL DATA:  Concern for pulmonary embolism. EXAM: CT ANGIOGRAPHY CHEST WITH CONTRAST TECHNIQUE: Multidetector CT imaging of the chest was performed using the standard protocol during bolus administration of intravenous contrast. Multiplanar CT image reconstructions and MIPs were obtained to evaluate the vascular anatomy. RADIATION DOSE REDUCTION: This exam was performed according to the departmental dose-optimization program which includes  automated exposure control, adjustment of the mA and/or kV according to patient size and/or use of iterative reconstruction technique. CONTRAST:  75mL OMNIPAQUE IOHEXOL 350 MG/ML SOLN COMPARISON:  Chest radiograph dated 01/30/2023. FINDINGS: Cardiovascular: There is no cardiomegaly or pericardial effusion. There is mild atherosclerotic calcification of the thoracic aorta. No aneurysmal dilatation or dissection. The origins of the great vessels of the aortic arch appear patent. Bilateral lower lobe segmental pulmonary artery emboli. No evidence of right heart straining. Mediastinum/Nodes: No hilar or mediastinal adenopathy. Small hiatal hernia. The  esophagus is grossly unremarkable. No mediastinal fluid collection. Lungs/Pleura: No focal consolidation, pleural effusion, pneumothorax. The central airways are patent. Upper Abdomen: Fatty liver with changes of cirrhosis. Past soft Jewel varices. Musculoskeletal: No acute osseous pathology. Review of the MIP images confirms the above findings. IMPRESSION: 1. Bilateral lower lobe segmental pulmonary artery emboli. No evidence of right heart straining. 2. No focal consolidation, pleural effusion, or pneumothorax. 3. Fatty liver with changes of cirrhosis. 4.  Aortic Atherosclerosis (ICD10-I70.0). These results were called by telephone at the time of interpretation on 12/20/2023 at 5:21 pm to Dr. Lynelle Doctor, who verbally acknowledged these results. Electronically Signed   By: Elgie Collard M.D.   On: 12/20/2023 17:29   VAS [place] LOWER EXTREMITY VENOUS (DVT) (7a-7p) Result Date: 12/20/2023  Lower Venous DVT Study Patient Name:  MOLLYANNE SCHWARZKOPF  Date of Exam:   12/20/2023 Medical Rec #: [id number]                Accession #:    [id number] Date of Birth: 04/13/1971                 Patient Gender: F Patient Age:   59 years Exam Location:  [facility] Procedure:      VAS [place] LOWER EXTREMITY VENOUS (DVT) Referring Phys: [facility] MEREDITH --------------------------------------------------------------------------------  Indications: Pain.  Risk Factors: Protein C deficiency and protein S deficiency DVT 01/27/23 extensive right from Distal CIV through to the PT and Peroneal veins and left EIV and distal CIV Patient had thrombectomy 01/29/23 and IVC filter removed 01/30/2023. Anticoagulation: Lovenox. Limitations: Poor ultrasound/tissue interface and bowel gas. Comparison Study: Prior study done 01/27/23, no showed to follow up appointment                   04/06/23 Performing Technologist: Sherren Kerns RVS  Examination Guidelines: A complete evaluation includes B-mode imaging, spectral Doppler, color Doppler, and power  Doppler as needed of all accessible portions of each vessel. Bilateral testing is considered an integral part of a complete examination. Limited examinations for reoccurring indications may be performed as noted. The reflux portion of the exam is performed with the patient in reverse Trendelenburg.  +-----+---------------+---------+-----------+----------+--------------+ RIGHTCompressibilityPhasicitySpontaneityPropertiesThrombus Aging +-----+---------------+---------+-----------+----------+--------------+ CFV  Full           Yes      No                                  +-----+---------------+---------+-----------+----------+--------------+ SFJ  Full                                                        +-----+---------------+---------+-----------+----------+--------------+ EIV  Not visualized +-----+---------------+---------+-----------+----------+--------------+   +---------+---------------+---------+-----------+----------+-------------------+ LEFT     CompressibilityPhasicitySpontaneityPropertiesThrombus Aging      +---------+---------------+---------+-----------+----------+-------------------+ CFV      Partial        No       No                   acute in distal                                                           portion             +---------+---------------+---------+-----------+----------+-------------------+ SFJ      Full           Yes      No                                       +---------+---------------+---------+-----------+----------+-------------------+ FV Prox  Partial        No       No                   Chronic             +---------+---------------+---------+-----------+----------+-------------------+ FV Mid   Partial                                      Chronic             +---------+---------------+---------+-----------+----------+-------------------+ FV DistalPartial                                                           +---------+---------------+---------+-----------+----------+-------------------+ PFV      Partial                                      Chronic             +---------+---------------+---------+-----------+----------+-------------------+ POP      Partial        No       No                   Chronic             +---------+---------------+---------+-----------+----------+-------------------+ PTV      Partial                                      Chronic             +---------+---------------+---------+-----------+----------+-------------------+ PERO     Partial                                      Chronic             +---------+---------------+---------+-----------+----------+-------------------+ Gastroc  Partial  No       No                   Chronic             +---------+---------------+---------+-----------+----------+-------------------+ SSV      None                                         acute from origin                                                         to mid calf         +---------+---------------+---------+-----------+----------+-------------------+ EIV                     No       No                   patent by color     +---------+---------------+---------+-----------+----------+-------------------+ CIV                                                   Not visualized      +---------+---------------+---------+-----------+----------+-------------------+     Summary: RIGHT: - No evidence of common femoral vein obstruction.   LEFT: - Findings consistent with acute deep vein thrombosis involving the Distal common femoral. - Findings consistent with acute superficial vein thrombosis involving the left small saphenous vein from the origin to the mid calf.  - Findings consistent with chronic deep vein thrombosis involving the left femoral vein, left proximal profunda vein, left popliteal  vein, left posterior tibial veins, and left peroneal veins. 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  *See table(s) above for measurements and observations. Electronically signed by Gerarda Fraction on 12/20/2023 at 4:21:48 PM.    Final     Microbiology: Results for orders placed or performed during the hospital encounter of 01/27/23  Surgical pcr screen     Status: Abnormal   Collection Time: 01/29/23 10:08 AM   Specimen: Nasal Mucosa; Nasal Swab  Result Value Ref Range Status   MRSA, PCR NEGATIVE NEGATIVE Final   Staphylococcus aureus POSITIVE (A) NEGATIVE Final    Comment: (NOTE) The Xpert SA Assay (FDA approved for NASAL specimens in patients 61 years of age and older), is one component of a comprehensive surveillance program. It is not intended to diagnose infection nor to guide or monitor treatment. Performed at [facility], [address], [place], [place] [address]     Labs: CBC: Recent Labs  Lab 12/20/23 1231 12/21/23 0315 12/22/23 0258 12/23/23 0227 12/24/23 0255  WBC 4.8 6.9 6.1 5.8 5.2  NEUTROABS 3.3  --   --   --   --   HGB 17.0* 15.4* 15.9* 15.4* 15.6*  HCT 49.5* 45.2 45.9 45.2 45.3  MCV 104.2* 105.4* 105.0* 105.4* 104.9*  PLT 82* 94* 93* 109* 128*   Basic Metabolic Panel: Recent Labs  Lab 12/20/23 1231 12/20/23 1943 12/21/23 0315 12/22/23 0258 12/23/23 0227  NA 134*  --  136  135 136  K 3.4*  --  4.4 4.0 4.1  CL 97*  --  101 104 102  CO2 25  --  27 22 25   GLUCOSE 155*  --  98 118* 96  BUN 5*  --  5* <5* 6  CREATININE 0.75  --  0.76 0.73 0.74  CALCIUM 8.8*  --  8.6* 8.9 9.2  MG  --  1.8  --   --   --    Liver Function Tests: Recent Labs  Lab 12/20/23 1943 12/21/23 0315 12/22/23 0258 12/23/23 0227  AST 45* 45* 65* 70*  ALT 27 23 35 45*  ALKPHOS 121 112 129* 123  BILITOT 1.9* 1.7* 1.5* 1.7*  PROT 7.8 7.0 7.3 7.4  ALBUMIN 3.3* 3.1* 3.3* 3.3*   CBG: Recent Labs  Lab  12/23/23 0608 12/23/23 1056 12/23/23 1624 12/23/23 2052 12/24/23 0556  GLUCAP 91 141* 122* 124* 102*    Discharge time spent: approximately 25 minutes spent on discharge counseling, evaluation of patient on day of discharge, and coordination of discharge planning with nursing, social work, pharmacy and case management  Signed: Alberteen Sam, MD [facility] 12/24/2023

## 2023-12-24 NOTE — Progress Notes (Signed)
   12/24/23 1000  TOC Brief Assessment  Insurance and Status Reviewed  Patient has primary care physician Yes  Home environment has been reviewed home w/ spouse  Prior level of function: self  Prior/Current Home Services No current home services  Social Drivers of Health Review SDOH reviewed no interventions necessary  Readmission risk has been reviewed Yes  Transition of care needs no transition of care needs at this time    Pt stable for transition home today, benefit check completed for Pradaxa copay- pt has zero dollar copay. PharmD has seen pt for education. No transportation needs noted. Pt to follow up as per AVS instructions.

## 2023-12-24 NOTE — Telephone Encounter (Signed)
Patient Product/process development scientist completed.    The patient is insured through [employer]. Patient has [employer], may use a copay card, and/or apply for patient assistance if available.    Ran test claim for dabigatran (Pradaxa) 150 mg and the current 30 day co-pay is $0.00.  Ran test claim for fondaparinux (Arixtra) 10 mg/0.8 ml and the current 30 day co-pay is $0.00.    This test claim was processed through [facility]- copay amounts may vary at other pharmacies due to pharmacy/plan contracts, or as the patient moves through the different stages of their insurance plan.     Roland Earl, CPHT Pharmacy Technician III Certified Patient Advocate [facility] Patient Advocate Team Direct Number: [phone]  Fax: [phone]

## 2024-01-08 ENCOUNTER — Encounter: Payer: Self-pay | Admitting: Hematology & Oncology

## 2024-01-29 ENCOUNTER — Telehealth: Payer: Self-pay | Admitting: Hematology & Oncology

## 2024-01-29 NOTE — Telephone Encounter (Signed)
Called to schedule new pt appt -- pt last seen by Dr Myna Hidalgo inpatient

## 2024-01-30 ENCOUNTER — Telehealth: Payer: Self-pay | Admitting: Hematology & Oncology

## 2024-01-30 NOTE — Telephone Encounter (Signed)
 Called to schedule New Pt hospital follow up appt w/ Dr Maria Shiner. LVM to return call for scheduling.

## 2024-02-05 ENCOUNTER — Telehealth: Payer: Self-pay | Admitting: Hematology & Oncology

## 2024-02-05 NOTE — Telephone Encounter (Signed)
Called to schedule New Pt hospital follow up w. Dr Myna Hidalgo. LVM for patient to return call.

## 2024-02-21 ENCOUNTER — Inpatient Hospital Stay: Payer: BC Managed Care – PPO

## 2024-02-21 ENCOUNTER — Inpatient Hospital Stay: Payer: BC Managed Care – PPO | Admitting: Hematology & Oncology

## 2024-02-29 ENCOUNTER — Other Ambulatory Visit: Payer: Self-pay | Admitting: Family

## 2024-02-29 DIAGNOSIS — I82491 Acute embolism and thrombosis of other specified deep vein of right lower extremity: Secondary | ICD-10-CM

## 2024-02-29 DIAGNOSIS — I2699 Other pulmonary embolism without acute cor pulmonale: Secondary | ICD-10-CM

## 2024-02-29 DIAGNOSIS — I82220 Acute embolism and thrombosis of inferior vena cava: Secondary | ICD-10-CM

## 2024-02-29 DIAGNOSIS — D6859 Other primary thrombophilia: Secondary | ICD-10-CM

## 2024-03-03 ENCOUNTER — Encounter: Payer: Self-pay | Admitting: Family

## 2024-03-03 ENCOUNTER — Inpatient Hospital Stay: Payer: BC Managed Care – PPO | Attending: Hematology & Oncology

## 2024-03-03 ENCOUNTER — Inpatient Hospital Stay (HOSPITAL_BASED_OUTPATIENT_CLINIC_OR_DEPARTMENT_OTHER): Payer: BC Managed Care – PPO | Admitting: Family

## 2024-03-03 ENCOUNTER — Telehealth (HOSPITAL_BASED_OUTPATIENT_CLINIC_OR_DEPARTMENT_OTHER): Payer: Self-pay

## 2024-03-03 VITALS — BP 109/77 | HR 62 | Temp 98.3°F | Resp 17 | Ht 62.75 in | Wt 164.0 lb

## 2024-03-03 DIAGNOSIS — I82220 Acute embolism and thrombosis of inferior vena cava: Secondary | ICD-10-CM

## 2024-03-03 DIAGNOSIS — I82491 Acute embolism and thrombosis of other specified deep vein of right lower extremity: Secondary | ICD-10-CM

## 2024-03-03 DIAGNOSIS — D6859 Other primary thrombophilia: Secondary | ICD-10-CM | POA: Diagnosis present

## 2024-03-03 DIAGNOSIS — Z8049 Family history of malignant neoplasm of other genital organs: Secondary | ICD-10-CM

## 2024-03-03 DIAGNOSIS — I82502 Chronic embolism and thrombosis of unspecified deep veins of left lower extremity: Secondary | ICD-10-CM | POA: Insufficient documentation

## 2024-03-03 DIAGNOSIS — Z808 Family history of malignant neoplasm of other organs or systems: Secondary | ICD-10-CM

## 2024-03-03 DIAGNOSIS — I2699 Other pulmonary embolism without acute cor pulmonale: Secondary | ICD-10-CM

## 2024-03-03 DIAGNOSIS — Z7902 Long term (current) use of antithrombotics/antiplatelets: Secondary | ICD-10-CM | POA: Diagnosis not present

## 2024-03-03 LAB — CMP (CANCER CENTER ONLY)
ALT: 29 U/L (ref 0–44)
AST: 30 U/L (ref 15–41)
Albumin: 3.8 g/dL (ref 3.5–5.0)
Alkaline Phosphatase: 121 U/L (ref 38–126)
Anion gap: 8 (ref 5–15)
BUN: 10 mg/dL (ref 6–20)
CO2: 27 mmol/L (ref 22–32)
Calcium: 9 mg/dL (ref 8.9–10.3)
Chloride: 101 mmol/L (ref 98–111)
Creatinine: 0.7 mg/dL (ref 0.44–1.00)
GFR, Estimated: >60 mL/min (ref >60)
Glucose, Bld: 201 mg/dL — ABNORMAL HIGH (ref 70–99)
Potassium: 4 mmol/L (ref 3.5–5.1)
Sodium: 136 mmol/L (ref 135–145)
Total Bilirubin: 1.1 mg/dL (ref 0.0–1.2)
Total Protein: 7.5 g/dL (ref 6.5–8.1)

## 2024-03-03 LAB — CBC WITH DIFFERENTIAL (CANCER CENTER ONLY)
Abs Immature Granulocytes: 0.1 10*3/uL — ABNORMAL HIGH (ref 0.00–0.07)
Basophils Absolute: 0.1 10*3/uL (ref 0.0–0.1)
Basophils Relative: 1 %
Eosinophils Absolute: 0.1 10*3/uL (ref 0.0–0.5)
Eosinophils Relative: 2 %
HCT: 40.8 % (ref 36.0–46.0)
Hemoglobin: 14.6 g/dL (ref 12.0–15.0)
Immature Granulocytes: 2 %
Lymphocytes Relative: 23 %
Lymphs Abs: 1.3 10*3/uL (ref 0.7–4.0)
MCH: 36.7 pg — ABNORMAL HIGH (ref 26.0–34.0)
MCHC: 35.8 g/dL (ref 30.0–36.0)
MCV: 102.5 fL — ABNORMAL HIGH (ref 80.0–100.0)
Monocytes Absolute: 0.4 10*3/uL (ref 0.1–1.0)
Monocytes Relative: 7 %
Neutro Abs: 3.5 10*3/uL (ref 1.7–7.7)
Neutrophils Relative %: 65 %
Platelet Count: 98 10*3/uL — ABNORMAL LOW (ref 150–400)
RBC: 3.98 MIL/uL (ref 3.87–5.11)
RDW: 13.4 % (ref 11.5–15.5)
WBC Count: 5.4 10*3/uL (ref 4.0–10.5)
nRBC: 0 % (ref 0.0–0.2)

## 2024-03-03 NOTE — Progress Notes (Unsigned)
 Hematology/Oncology Consultation   Name: Shelby Conner      MRN: [id number]    Location: Room/bed info not found  Date: 03/03/2024 Time:9:04 AM   REFERRING PHYSICIAN:  Joen Laura, MD  REASON FOR CONSULT:   Acute PE Acute DVT Protein C deficiency    DIAGNOSIS:  Multiple recurrent PE's and DVT's since 2006-present Acute superficial LLE thrombus Low protein C total and activity  HISTORY OF PRESENT ILLNESS:  [name] is a pleasant 53 yo female with significant history of recurrent PE's and DVT's. 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  She was hospitalized in December 2024 after going to the ED with left leg swelling and SOB.  She had been on coumadin in the past as well as Xarelto and then Lovenox BID prior to this past hospitalization.  She has prior history of bilateral PE and LLE DVT in 2006 during her 8th month of pregnancy. She had an IVC filter as well as a left iliac stent placed at that time. She also had low protein S and C levels as well. She was treated with Heparin during pregnancy and coumadin after delivery.  She then developed a left lower extremity DVT while transitioning back to Heparin after her 2nd child was born via C-section in 2010. Venogram at that time showed left common femoral and iliac vein as well as IVC thrombus below the filter treated with thrombolysis.  She states that she could not keep a theraputic INR while on coumadin.  She states that her [place] IVC filter was removed along with thrombolysis of bilateral popliteal veins using alteplase in 01/2023. Her iliac stent was also reopened with balloon. She had recently stopped her Coumadin for a dental procedure.  Her paternal grandmother, father and sister all have prior history of DVT or PE. She states that both her father and sister have known protein C deficiency.  She states that she has a son and a daughter and  history of 2 miscarriages.  She has thrombocytopenia as well with history of cirrhosis with stigmata of portal hypertension, splenomegaly, and ascites. She was HIT negative in the hospital and transitioned to Argatroban then went home on Pradaxa 150 mg PO BID. Platelets today are stable at 98.  So far, she has tolerated Pradaxa nicely. No issues with bleeding, bruising or petechiae.  She states that she has had kidney and liver issues since she was a child.  LFT's, BUN and creatinine today look good.  She states that back in 2022 she was seen by [facility]'s transplant team but was not found to be a candidate at that time for liver transplant.  No ETOH, smoking or recreational drug use at this time.  She is a type II diabetic. She states that this is managed well with insulin.  She has hypothyroidism and is on Synthroid.  No personal history of cancer. Her maternal grandfather had glioblastoma and maternal grandmother had cervical cancer.  She states that she is due for both a mammogram as well as her first colonoscopy.  EGD in 10/2020 showed grade C esophagitis (non bleeding), no evidence of varices and gastritis.  She states since starting treatment her SOB is much improved.  No fever, chills, n/v, cough, rash, dizziness, chest pain, palpitations, abdominal pain or changes in bowel or bladder habits.   She has some tenderness in the left inner thigh as well as pooling discoloration in both ankles with history  of DVT.  Pedal pulses are 1+.  No falls or syncope reported.  Appetite and hydration are good. Weight is stable at 164 lbs.  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   ROS: All other 10 point review of systems is negative.   PAST MEDICAL HISTORY:   Past Medical History:  Diagnosis Date   Diabetes mellitus without complication (HCC)    Fatty liver    Hyperlipidemia    Protein C deficiency (HCC)    Protein S deficiency (HCC)     ALLERGIES: Allergies  Allergen Reactions   Morphine  Itching    Per pt       MEDICATIONS:  Current Outpatient Medications on File Prior to Visit  Medication Sig Dispense Refill   dabigatran (PRADAXA) 150 MG CAPS capsule Take 1 capsule (150 mg total) by mouth every 12 (twelve) hours. 60 capsule 0   insulin degludec (TRESIBA FLEXTOUCH) 100 UNIT/ML FlexTouch Pen Inject 26 Units into the skin daily.     Potassium (POTASSIMIN PO) Take 1 tablet by mouth every evening.     No current facility-administered medications on file prior to visit.     PAST SURGICAL HISTORY Past Surgical History:  Procedure Laterality Date   ANKLE SURGERY Right    BIOPSY  11/16/2020   Procedure: BIOPSY;  Surgeon: Kathi Der, MD;  Location: [facility]COPY;  Service: Gastroenterology;;   CESAREAN SECTION     ESOPHAGOGASTRODUODENOSCOPY N/A 11/16/2020   Procedure: ESOPHAGOGASTRODUODENOSCOPY (EGD);  Surgeon: Kathi Der, MD;  Location: [facility] ENDOSCOPY;  Service: Gastroenterology;  Laterality: N/A;   INSERTION OF ILIAC STENT N/A 01/30/2023   Procedure: INSERTION OF InternalVena Cava Stent;  Surgeon: Maeola Harman, MD;  Location: [facility] OR;  Service: Vascular;  Laterality: N/A;   IR PARACENTESIS  11/12/2020   KNEE SURGERY Left    PERCUTANEOUS VENOUS THROMBECTOMY,LYSIS WITH INTRAVASCULAR ULTRASOUND (IVUS) N/A 01/30/2023   Procedure: PERCUTANEOUS VENOUS THROMBECTOMY AND LYSIS WITH INTRAVASCULAR ULTRASOUND (IVUS);  Surgeon: Maeola Harman, MD;  Location: [facility] OR;  Service: Vascular;  Laterality: N/A;   VENOGRAM N/A 01/30/2023   Procedure: [name];  Surgeon: Maeola Harman, MD;  Location: [facility] OR;  Service: Vascular;  Laterality: N/A;    FAMILY HISTORY: Family History  Problem Relation Age of Onset   Hypertension Mother    Rheum arthritis Mother    Hyperlipidemia Father    Hypertension Father    Diabetes Father    Hypertension Maternal Grandmother    Hypertension Maternal Grandfather    Cancer Maternal Grandfather    Hypertension Paternal  Grandmother    Heart failure Paternal Grandmother    Hypertension Paternal Grandfather    Liver disease Neg Hx     SOCIAL HISTORY:  reports that she has quit smoking. She has never used smokeless tobacco. She reports current alcohol use. She reports that she does not use drugs.  PERFORMANCE STATUS: The patient's performance status is 1 - Symptomatic but completely ambulatory  PHYSICAL EXAM: Most Recent Vital Signs: unknown if currently breastfeeding. BP 109/77 (BP Location: Right Arm, Patient Position: Sitting, Cuff Size: Large)   Pulse 62   Temp 98.3 F (36.8 C) (Oral)   Resp 17   Ht 5' 2.75" (1.594 m)   Wt 164 lb (74.4 kg)   SpO2 97%   BMI 29.28 kg/m   General Appearance:    Alert, cooperative, no distress, appears stated age  Head:    Normocephalic, without obvious abnormality, atraumatic  Eyes:    PERRL, conjunctiva/corneas clear, EOM's  intact, fundi    benign, both eyes        Throat:   Lips, mucosa, and tongue normal; teeth and gums normal  Neck:   Supple, symmetrical, trachea midline, no adenopathy;    thyroid:  no enlargement/tenderness/nodules; no carotid   bruit or JVD  Back:     Symmetric, no curvature, ROM normal, no CVA tenderness  Lungs:     Clear to auscultation bilaterally, respirations unlabored  Chest Wall:    No tenderness or deformity   Heart:    Regular rate and rhythm, S1 and S2 normal, no murmur, rub   or gallop     Abdomen:     Soft, non-tender, bowel sounds active all four quadrants,    no masses, no organomegaly        Extremities:   Extremities normal, atraumatic, no cyanosis or edema  Pulses:   2+ and symmetric all extremities  Skin:   Skin color, texture, turgor normal, no rashes or lesions  Lymph nodes:   Cervical, supraclavicular, and axillary nodes normal  Neurologic:   CNII-XII intact, normal strength, sensation and reflexes    throughout    LABORATORY DATA:  Results for orders placed or performed in visit on 03/03/24 (from the  past 48 hours)  CBC with Differential (Cancer Center Only)     Status: Abnormal   Collection Time: 03/03/24  8:47 AM  Result Value Ref Range   WBC Count 5.4 4.0 - 10.5 K/uL   RBC 3.98 3.87 - 5.11 MIL/uL   Hemoglobin 14.6 12.0 - 15.0 g/dL   HCT 16.1 09.6 - 04.5 %   MCV 102.5 (H) 80.0 - 100.0 fL   MCH 36.7 (H) 26.0 - 34.0 pg   MCHC 35.8 30.0 - 36.0 g/dL   RDW 40.9 81.1 - 91.4 %   Platelet Count 98 (L) 150 - 400 K/uL   nRBC 0.0 0.0 - 0.2 %   Neutrophils Relative % 65 %   Neutro Abs 3.5 1.7 - 7.7 K/uL   Lymphocytes Relative 23 %   Lymphs Abs 1.3 0.7 - 4.0 K/uL   Monocytes Relative 7 %   Monocytes Absolute 0.4 0.1 - 1.0 K/uL   Eosinophils Relative 2 %   Eosinophils Absolute 0.1 0.0 - 0.5 K/uL   Basophils Relative 1 %   Basophils Absolute 0.1 0.0 - 0.1 K/uL   Immature Granulocytes 2 %   Abs Immature Granulocytes 0.10 (H) 0.00 - 0.07 K/uL    Comment: Performed at [facility], [address], [place], [place] [address]      RADIOGRAPHY: No results found.     PATHOLOGY: None   ASSESSMENT/PLAN: Ms. Doody is a pleasant 53 yo female with significant history of recurrent PE's and DVT's and protein C deficeincy. 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  Continue same regimen with Pradaxa.  We will repeat her CT angio and US of the left leg in early April to assess response to Pradaxa.  Follow-up with MD in 3 months.   All questions were answered. The patient knows to call the clinic with any problems, questions or concerns. We can certainly see the patient much sooner if necessary.  The patient was discussed with Dr. Myna Hidalgo and he is in agreement with the aforementioned.   Eileen Stanford, NP

## 2024-03-04 ENCOUNTER — Encounter (HOSPITAL_BASED_OUTPATIENT_CLINIC_OR_DEPARTMENT_OTHER): Payer: Self-pay

## 2024-03-09 ENCOUNTER — Encounter (HOSPITAL_COMMUNITY): Payer: Self-pay | Admitting: *Deleted

## 2024-03-09 ENCOUNTER — Emergency Department (HOSPITAL_COMMUNITY)

## 2024-03-09 ENCOUNTER — Inpatient Hospital Stay (HOSPITAL_COMMUNITY)
Admission: EM | Admit: 2024-03-09 | Discharge: 2024-03-14 | DRG: 270 | Disposition: A | Discharge status: Home / Self Care | Attending: Internal Medicine | Admitting: Internal Medicine

## 2024-03-09 ENCOUNTER — Other Ambulatory Visit: Payer: Self-pay

## 2024-03-09 DIAGNOSIS — Z86711 Personal history of pulmonary embolism: Secondary | ICD-10-CM

## 2024-03-09 DIAGNOSIS — I8510 Secondary esophageal varices without bleeding: Secondary | ICD-10-CM | POA: Diagnosis present

## 2024-03-09 DIAGNOSIS — T82898A Other specified complication of vascular prosthetic devices, implants and grafts, initial encounter: Principal | ICD-10-CM | POA: Diagnosis present

## 2024-03-09 DIAGNOSIS — I82422 Acute embolism and thrombosis of left iliac vein: Secondary | ICD-10-CM | POA: Diagnosis present

## 2024-03-09 DIAGNOSIS — Z86718 Personal history of other venous thrombosis and embolism: Secondary | ICD-10-CM

## 2024-03-09 DIAGNOSIS — I82409 Acute embolism and thrombosis of unspecified deep veins of unspecified lower extremity: Secondary | ICD-10-CM | POA: Diagnosis present

## 2024-03-09 DIAGNOSIS — L03114 Cellulitis of left upper limb: Secondary | ICD-10-CM | POA: Diagnosis present

## 2024-03-09 DIAGNOSIS — I2510 Atherosclerotic heart disease of native coronary artery without angina pectoris: Secondary | ICD-10-CM | POA: Diagnosis present

## 2024-03-09 DIAGNOSIS — I8290 Acute embolism and thrombosis of unspecified vein: Principal | ICD-10-CM

## 2024-03-09 DIAGNOSIS — Z8261 Family history of arthritis: Secondary | ICD-10-CM

## 2024-03-09 DIAGNOSIS — E785 Hyperlipidemia, unspecified: Secondary | ICD-10-CM | POA: Diagnosis present

## 2024-03-09 DIAGNOSIS — E119 Type 2 diabetes mellitus without complications: Secondary | ICD-10-CM | POA: Diagnosis not present

## 2024-03-09 DIAGNOSIS — Z6830 Body mass index (BMI) 30.0-30.9, adult: Secondary | ICD-10-CM

## 2024-03-09 DIAGNOSIS — K7581 Nonalcoholic steatohepatitis (NASH): Secondary | ICD-10-CM | POA: Diagnosis present

## 2024-03-09 DIAGNOSIS — I82492 Acute embolism and thrombosis of other specified deep vein of left lower extremity: Secondary | ICD-10-CM

## 2024-03-09 DIAGNOSIS — D6859 Other primary thrombophilia: Secondary | ICD-10-CM | POA: Diagnosis present

## 2024-03-09 DIAGNOSIS — Z87891 Personal history of nicotine dependence: Secondary | ICD-10-CM

## 2024-03-09 DIAGNOSIS — Z95820 Peripheral vascular angioplasty status with implants and grafts: Secondary | ICD-10-CM

## 2024-03-09 DIAGNOSIS — Z794 Long term (current) use of insulin: Secondary | ICD-10-CM

## 2024-03-09 DIAGNOSIS — Z7902 Long term (current) use of antithrombotics/antiplatelets: Secondary | ICD-10-CM

## 2024-03-09 DIAGNOSIS — E871 Hypo-osmolality and hyponatremia: Secondary | ICD-10-CM | POA: Diagnosis present

## 2024-03-09 DIAGNOSIS — D7589 Other specified diseases of blood and blood-forming organs: Secondary | ICD-10-CM | POA: Diagnosis present

## 2024-03-09 DIAGNOSIS — Z79899 Other long term (current) drug therapy: Secondary | ICD-10-CM

## 2024-03-09 DIAGNOSIS — K7460 Unspecified cirrhosis of liver: Secondary | ICD-10-CM | POA: Diagnosis present

## 2024-03-09 DIAGNOSIS — Z833 Family history of diabetes mellitus: Secondary | ICD-10-CM

## 2024-03-09 DIAGNOSIS — E1165 Type 2 diabetes mellitus with hyperglycemia: Secondary | ICD-10-CM | POA: Diagnosis present

## 2024-03-09 DIAGNOSIS — K766 Portal hypertension: Secondary | ICD-10-CM | POA: Diagnosis present

## 2024-03-09 DIAGNOSIS — Z885 Allergy status to narcotic agent status: Secondary | ICD-10-CM

## 2024-03-09 DIAGNOSIS — Z79890 Hormone replacement therapy (postmenopausal): Secondary | ICD-10-CM

## 2024-03-09 DIAGNOSIS — D696 Thrombocytopenia, unspecified: Secondary | ICD-10-CM | POA: Diagnosis present

## 2024-03-09 DIAGNOSIS — Z8249 Family history of ischemic heart disease and other diseases of the circulatory system: Secondary | ICD-10-CM

## 2024-03-09 DIAGNOSIS — D6959 Other secondary thrombocytopenia: Secondary | ICD-10-CM | POA: Diagnosis present

## 2024-03-09 DIAGNOSIS — E66811 Obesity, class 1: Secondary | ICD-10-CM | POA: Diagnosis present

## 2024-03-09 DIAGNOSIS — I82220 Acute embolism and thrombosis of inferior vena cava: Secondary | ICD-10-CM | POA: Diagnosis present

## 2024-03-09 DIAGNOSIS — K7030 Alcoholic cirrhosis of liver without ascites: Secondary | ICD-10-CM | POA: Diagnosis present

## 2024-03-09 DIAGNOSIS — I82412 Acute embolism and thrombosis of left femoral vein: Secondary | ICD-10-CM | POA: Diagnosis present

## 2024-03-09 DIAGNOSIS — E038 Other specified hypothyroidism: Secondary | ICD-10-CM | POA: Diagnosis present

## 2024-03-09 DIAGNOSIS — Y831 Surgical operation with implant of artificial internal device as the cause of abnormal reaction of the patient, or of later complication, without mention of misadventure at the time of the procedure: Secondary | ICD-10-CM | POA: Diagnosis present

## 2024-03-09 DIAGNOSIS — Z83438 Family history of other disorder of lipoprotein metabolism and other lipidemia: Secondary | ICD-10-CM

## 2024-03-09 LAB — CBC
HCT: 39.6 % (ref 36.0–46.0)
Hemoglobin: 13.8 g/dL (ref 12.0–15.0)
MCH: 36.5 pg — ABNORMAL HIGH (ref 26.0–34.0)
MCHC: 34.8 g/dL (ref 30.0–36.0)
MCV: 104.8 fL — ABNORMAL HIGH (ref 80.0–100.0)
Platelets: 88 10*3/uL — ABNORMAL LOW (ref 150–400)
RBC: 3.78 MIL/uL — ABNORMAL LOW (ref 3.87–5.11)
RDW: 13.8 % (ref 11.5–15.5)
WBC: 6.7 10*3/uL (ref 4.0–10.5)
nRBC: 0 % (ref 0.0–0.2)

## 2024-03-09 LAB — COMPREHENSIVE METABOLIC PANEL
ALT: 24 U/L (ref 0–44)
AST: 36 U/L (ref 15–41)
Albumin: 3.1 g/dL — ABNORMAL LOW (ref 3.5–5.0)
Alkaline Phosphatase: 119 U/L (ref 38–126)
Anion gap: 16 — ABNORMAL HIGH (ref 5–15)
BUN: 12 mg/dL (ref 6–20)
CO2: 21 mmol/L — ABNORMAL LOW (ref 22–32)
Calcium: 8.8 mg/dL — ABNORMAL LOW (ref 8.9–10.3)
Chloride: 96 mmol/L — ABNORMAL LOW (ref 98–111)
Creatinine, Ser: 0.9 mg/dL (ref 0.44–1.00)
GFR, Estimated: >60 mL/min (ref >60)
Glucose, Bld: 368 mg/dL — ABNORMAL HIGH (ref 70–99)
Potassium: 3.6 mmol/L (ref 3.5–5.1)
Sodium: 133 mmol/L — ABNORMAL LOW (ref 135–145)
Total Bilirubin: 2.1 mg/dL — ABNORMAL HIGH (ref 0.0–1.2)
Total Protein: 7.1 g/dL (ref 6.5–8.1)

## 2024-03-09 LAB — PROTIME-INR
INR: 1.8 — ABNORMAL HIGH (ref 0.8–1.2)
Prothrombin Time: 20.7 s — ABNORMAL HIGH (ref 11.4–15.2)

## 2024-03-09 LAB — APTT: aPTT: 42 s — ABNORMAL HIGH (ref 24–36)

## 2024-03-09 LAB — HEPARIN LEVEL (UNFRACTIONATED): Heparin Unfractionated: <0.1 [IU]/mL — ABNORMAL LOW (ref 0.30–0.70)

## 2024-03-09 MED ORDER — FENTANYL CITRATE PF 50 MCG/ML IJ SOSY: 50.0000 ug | PREFILLED_SYRINGE | Freq: Once | INTRAMUSCULAR | Status: DC

## 2024-03-09 MED ORDER — OXYCODONE-ACETAMINOPHEN 5-325 MG PO TABS
1.0000 | ORAL_TABLET | Freq: Once | ORAL | Status: AC
  Administered 2024-03-09: 1 via ORAL
  Filled 2024-03-09: qty 1

## 2024-03-09 MED ORDER — LEVOTHYROXINE SODIUM 50 MCG PO TABS
50.0000 ug | ORAL_TABLET | Freq: Every day | ORAL | Status: DC
  Administered 2024-03-10 – 2024-03-14 (×5): 50 ug via ORAL
  Filled 2024-03-09 (×2): qty 1
  Filled 2024-03-09: qty 2
  Filled 2024-03-09 (×2): qty 1

## 2024-03-09 MED ORDER — IOHEXOL 350 MG/ML SOLN
125.0000 mL | Freq: Once | INTRAVENOUS | Status: AC | PRN
  Administered 2024-03-09: 125 mL via INTRAVENOUS

## 2024-03-09 MED ORDER — HYDROMORPHONE HCL 1 MG/ML IJ SOLN
1.0000 mg | Freq: Once | INTRAMUSCULAR | Status: AC
  Administered 2024-03-09: 1 mg via INTRAVENOUS
  Filled 2024-03-09: qty 1

## 2024-03-09 MED ORDER — EZETIMIBE 10 MG PO TABS
10.0000 mg | ORAL_TABLET | Freq: Every day | ORAL | Status: DC
  Administered 2024-03-11 – 2024-03-14 (×4): 10 mg via ORAL
  Filled 2024-03-09 (×4): qty 1

## 2024-03-09 MED ORDER — INSULIN ASPART 100 UNIT/ML IJ SOLN
0.0000 [IU] | INTRAMUSCULAR | Status: DC
  Administered 2024-03-10: 2 [IU] via SUBCUTANEOUS
  Administered 2024-03-11: 1 [IU] via SUBCUTANEOUS

## 2024-03-09 MED ORDER — HEPARIN BOLUS VIA INFUSION
5000.0000 [IU] | Freq: Once | INTRAVENOUS | Status: AC
  Administered 2024-03-09: 5000 [IU] via INTRAVENOUS
  Filled 2024-03-09: qty 5000

## 2024-03-09 MED ORDER — HYDROMORPHONE HCL 1 MG/ML IJ SOLN
0.5000 mg | Freq: Once | INTRAMUSCULAR | Status: AC
  Administered 2024-03-09: 0.5 mg via INTRAVENOUS
  Filled 2024-03-09: qty 1

## 2024-03-09 MED ORDER — HEPARIN (PORCINE) 25000 UT/250ML-% IV SOLN
1800.0000 [IU]/h | INTRAVENOUS | Status: DC
  Administered 2024-03-09: 1200 [IU]/h via INTRAVENOUS
  Administered 2024-03-10: 1250 [IU]/h via INTRAVENOUS
  Administered 2024-03-10: 500 [IU]/h via INTRAVENOUS
  Administered 2024-03-11: 1400 [IU]/h via INTRAVENOUS
  Administered 2024-03-11: 1750 [IU]/h via INTRAVENOUS
  Administered 2024-03-12 – 2024-03-13 (×2): 1800 [IU]/h via INTRAVENOUS
  Filled 2024-03-09 (×6): qty 250

## 2024-03-09 NOTE — ED Triage Notes (Addendum)
 The pt has a blood clot in her lt thigh  just diagnosed on Monday  she has a history of clots in that same leg  leg so swollen that I cannot feel a pulse in the lt foot  she had 2 blood disorders  she has also had a clot in her rt thigh in the past  she is on a thinner  lmp none

## 2024-03-09 NOTE — Consult Note (Signed)
 [facility] [place]  ASSESSMENT / PLAN: 53 y.o. 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 She has a strong personally history of VTE. She has protein C and S deficiencies. She has well-compensated cirrhosis. She has been managed on Pradaxa by Dr. Myna Hidalgo, and reports good compliance. Please keep NPO at midnight. Will arrange for venous thrombectomy tomorrow if schedule can accommodate.   CHIEF COMPLAINT: left leg swelling and pain  HISTORY OF PRESENT ILLNESS: Shelby Conner is a 53 y.o. female well known to our service having undergone complex venous intervention for iliocaval DVT in February 2024 with multiple members of our staff. She did well postprocedure, but was lost to follow up in our office. She has been following with heme/onc. She has been managed with long term Pradaxa, with which she has been compliant. An outside venous duplex which is not available to me showed left leg dvt. The patient reports a several day history of pain, swelling, and warmth typical of prior episodes of DVT.  Past Medical History:  Diagnosis Date   Diabetes mellitus without complication (HCC)    Fatty liver    Hyperlipidemia    Protein C deficiency (HCC)    Protein S deficiency (HCC)     Past Surgical History:  Procedure Laterality Date   ANKLE SURGERY Right    BIOPSY  11/16/2020   Procedure: BIOPSY;  Surgeon: Kathi Der, MD;  Location: [facility] ENDOSCOPY;  Service: Gastroenterology;;   CESAREAN SECTION     ESOPHAGOGASTRODUODENOSCOPY N/A 11/16/2020   Procedure: ESOPHAGOGASTRODUODENOSCOPY (EGD);  Surgeon: Kathi Der, MD;  Location: [facility] ENDOSCOPY;  Service: Gastroenterology;  Laterality: N/A;   INSERTION OF ILIAC STENT N/A 01/30/2023   Procedure: INSERTION OF InternalVena Cava Stent;  Surgeon: Maeola Harman, MD;  Location: [facility] OR;  Service: Vascular;  Laterality: N/A;   IR PARACENTESIS  11/12/2020   KNEE SURGERY Left    PERCUTANEOUS VENOUS  THROMBECTOMY,LYSIS WITH INTRAVASCULAR ULTRASOUND (IVUS) N/A 01/30/2023   Procedure: PERCUTANEOUS VENOUS THROMBECTOMY AND LYSIS WITH INTRAVASCULAR ULTRASOUND (IVUS);  Surgeon: Maeola Harman, MD;  Location: [facility] OR;  Service: Vascular;  Laterality: N/A;   VENOGRAM N/A 01/30/2023   Procedure: [name];  Surgeon: Maeola Harman, MD;  Location: [facility] OR;  Service: Vascular;  Laterality: N/A;    Family History  Problem Relation Age of Onset   Hypertension Mother    Rheum arthritis Mother    Hyperlipidemia Father    Hypertension Father    Diabetes Father    Hypertension Maternal Grandmother    Hypertension Maternal Grandfather    Cancer Maternal Grandfather    Hypertension Paternal Grandmother    Heart failure Paternal Grandmother    Hypertension Paternal Grandfather    Liver disease Neg Hx     Social History   Socioeconomic History   Marital status: Married    Spouse name: Not on file   Number of children: Not on file   Years of education: Not on file   Highest education level: Not on file  Occupational History   Not on file  Tobacco Use   Smoking status: Former   Smokeless tobacco: Never  Vaping Use   Vaping status: Never Used  Substance and Sexual Activity   Alcohol use: Yes    Comment: occasionally   Drug use: Never   Sexual activity: Yes    Partners: Male    Birth control/protection: None  Other Topics Concern   Not on file  Social History Narrative   Not on file  Social Drivers of Corporate investment banker Strain: Not on file  Food Insecurity: No Food Insecurity (03/09/2024)   Hunger Vital Sign    Worried About Running Out of Food in the Last Year: Never true    Ran Out of Food in the Last Year: Never true  Transportation Needs: No Transportation Needs (03/09/2024)   PRAPARE - Administrator, Civil Service (Medical): No    Lack of Transportation (Non-Medical): No  Physical Activity: Not on file  Stress: Not on file  Social  Connections: Unknown (05/09/2022)   [facility]   Social Network    Social Network: Not on file  Intimate Partner Violence: Not At Risk (03/09/2024)   Humiliation, Afraid, Rape, and Kick questionnaire    Fear of Current or Ex-Partner: No    Emotionally Abused: No    Physically Abused: No    Sexually Abused: No    Allergies  Allergen Reactions   Morphine Itching    Per pt     Current Facility-Administered Medications  Medication Dose Route Frequency Provider Last Rate Last Admin   heparin ADULT infusion 100 units/mL (25000 units/247mL)  1,200 Units/hr Intravenous Continuous Gwyneth Sprout, MD 12 mL/hr at 03/09/24 2107 1,200 Units/hr at 03/09/24 2107   HYDROmorphone (DILAUDID) injection 0.5 mg  0.5 mg Intravenous Once Eduard Clos, MD       Current Outpatient Medications  Medication Sig Dispense Refill   dabigatran (PRADAXA) 150 MG CAPS capsule Take 1 capsule (150 mg total) by mouth every 12 (twelve) hours. 60 capsule 0   folic acid (FOLVITE) 400 MCG tablet Take 0.5 tablets by mouth daily.     insulin degludec (TRESIBA FLEXTOUCH) 100 UNIT/ML FlexTouch Pen Inject 26 Units into the skin daily.     insulin lispro (HUMALOG KWIKPEN) 100 UNIT/ML KwikPen Inject into the skin as directed. per sliding scale Subcutaneous three times per day for 30 days     levothyroxine (SYNTHROID) 50 MCG tablet Take 50 mcg by mouth daily.     Potassium (POTASSIMIN PO) Take 1 tablet by mouth every evening.     Continuous Glucose Sensor (FREESTYLE LIBRE 3 PLUS SENSOR) MISC change every 15 days for 30 days     desvenlafaxine (PRISTIQ) 25 MG 24 hr tablet Take 25 mg by mouth daily. (Patient not taking: Reported on 03/09/2024)     ergocalciferol (VITAMIN D2) 1.25 MG (50000 UT) capsule Take 50,000 Units by mouth once a week.     ezetimibe (ZETIA) 10 MG tablet Take 10 mg by mouth daily. (Patient not taking: Reported on 03/09/2024)      PHYSICAL EXAM Vitals:   03/09/24 1645  03/09/24 1805 03/09/24 2048 03/09/24 2100  BP: 96/62 110/69 100/64   Pulse: 75 90 64   Resp: 18 17 (!) 21   Temp:    98.1 F (36.7 C)  TempSrc:    Oral  SpO2: 91% 93% 97%   Weight:      Height:       Middle aged woman in no distress Regular rate and rhythm Unlabored breathing 2+ pitting edema in LLE with warmth.  Patient has motor / sensory function in the foot   PERTINENT LABORATORY AND RADIOLOGIC DATA  Most recent CBC    Latest Ref Rng & Units 03/09/2024    3:27 PM 03/03/2024    8:47 AM 12/24/2023    2:55 AM  CBC  WBC 4.0 - 10.5 K/uL 6.7  5.4  5.2  Hemoglobin 12.0 - 15.0 g/dL 45.4  09.8  11.9   Hematocrit 36.0 - 46.0 % 39.6  40.8  45.3   Platelets 150 - 400 K/uL 88  98  128      Most recent CMP    Latest Ref Rng & Units 03/09/2024    3:27 PM 03/03/2024    8:47 AM 12/23/2023    2:27 AM  CMP  Glucose 70 - 99 mg/dL 147  829  96   BUN 6 - 20 mg/dL 12  10  6    Creatinine 0.44 - 1.00 mg/dL 5.62  1.30  8.65   Sodium 135 - 145 mmol/L 133  136  136   Potassium 3.5 - 5.1 mmol/L 3.6  4.0  4.1   Chloride 98 - 111 mmol/L 96  101  102   CO2 22 - 32 mmol/L 21  27  25    Calcium 8.9 - 10.3 mg/dL 8.8  9.0  9.2   Total Protein 6.5 - 8.1 g/dL 7.1  7.5  7.4   Total Bilirubin 0.0 - 1.2 mg/dL 2.1  1.1  1.7   Alkaline Phos 38 - 126 U/L 119  121  123   AST 15 - 41 U/L 36  30  70   ALT 0 - 44 U/L 24  29  45     Renal function Estimated Creatinine Clearance: 69.8 mL/min (by C-G formula based on SCr of 0.9 mg/dL).  Hgb A1c MFr Bld (%)  Date Value  12/20/2023 8.0 (H)   CT venogram shows IVC stent has non-occlusive thrombus; there is occlusive thrombus in the left external iliac vein, common femoral vein, and femoral vein. Liver looks OK, but esophageal varices and recanalized umbilical vein noted c/w portal HTN  Rande Brunt. Lenell Antu, MD [facility] Phone Number: [phone] 03/09/2024 9:18 PM   Total time spent on preparing this  encounter including chart review, data review, collecting history, examining the patient, coordinating care for this established patient, 60 minutes.  Portions of this report may have been transcribed using voice recognition software.  Every effort has been made to ensure accuracy; however, inadvertent computerized transcription errors may still be present.

## 2024-03-09 NOTE — ED Notes (Signed)
 Called and placed PT on monitor with CCMD.

## 2024-03-09 NOTE — H&P (Signed)
 History and Physical    Shelby Conner MWN:[id number] DOB: Oct 18, 1971 DOA: 03/09/2024  Patient coming from: Home.  Chief Complaint: Left lower extremity swelling and pain.  HPI: Shelby Conner is a 53 y.o. female with history of DVT and pulmonary embolism first diagnosed in 2006 when patient was pregnant with history of protein C and S deficiency has been on warfarin for a long time and had switched to low molecular weight heparin and also was briefly on Xarelto was admitted in December 2024 for extensive DVT at the time was placed on Pradaxa presents to the ER today because of worsening swelling and pain and as per the report outpatient Doppler showed DVT of the left lower extremity.  Patient states she has been compliant with Pradaxa and not missed a dose.  Denies any chest pain or shortness of breath.  She was admitted in February 2024 for extensive DVT of the lower extremities.  At that time vascular surgery was consulted and an patient underwent pharmacomechanical thrombectomy with filter removal and IVC stent placement.  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  ED Course: In the ER patient had a CT venogram shows IVC stent has nonocclusive thrombus and there is occlusive thrombus in the left external iliac vein common femoral vein and femoral vein.  Dr. Lenell Antu vascular surgeon was consulted plan is to arrange for venous thrombectomy in the morning and after discussing with Dr. Arlan Organ patient was started on heparin infusion.  Labs show hemoglobin of 13.8 platelets 88 blood glucose 368.  Macrocytic picture.  Review of Systems: As per HPI, rest all negative.   Past Medical History:  Diagnosis Date   Diabetes mellitus without complication (HCC)    Fatty liver    Hyperlipidemia    Protein C  deficiency (HCC)    Protein S deficiency (HCC)     Past Surgical History:  Procedure Laterality Date   ANKLE SURGERY Right    BIOPSY  11/16/2020   Procedure: BIOPSY;  Surgeon: Kathi Der, MD;  Location: [facility] ENDOSCOPY;  Service: Gastroenterology;;   CESAREAN SECTION     ESOPHAGOGASTRODUODENOSCOPY N/A 11/16/2020   Procedure: ESOPHAGOGASTRODUODENOSCOPY (EGD);  Surgeon: Kathi Der, MD;  Location: [facility] ENDOSCOPY;  Service: Gastroenterology;  Laterality: N/A;   INSERTION OF ILIAC STENT N/A 01/30/2023   Procedure: INSERTION OF InternalVena Cava Stent;  Surgeon: Maeola Harman, MD;  Location: [facility] OR;  Service: Vascular;  Laterality: N/A;   IR PARACENTESIS  11/12/2020   KNEE SURGERY Left    PERCUTANEOUS VENOUS THROMBECTOMY,LYSIS WITH INTRAVASCULAR ULTRASOUND (IVUS) N/A 01/30/2023   Procedure: PERCUTANEOUS VENOUS THROMBECTOMY AND LYSIS WITH INTRAVASCULAR ULTRASOUND (IVUS);  Surgeon: Maeola Harman, MD;  Location: [facility] OR;  Service: Vascular;  Laterality: N/A;   VENOGRAM N/A 01/30/2023   Procedure: [name];  Surgeon: Maeola Harman, MD;  Location: [facility] OR;  Service: Vascular;  Laterality: N/A;     reports that she has quit smoking. She has never used smokeless tobacco. She reports current alcohol use. She reports that she does not use drugs.  Allergies  Allergen Reactions   Morphine Itching    Per pt     Family History  Problem Relation Age of Onset   Hypertension Mother    Rheum arthritis Mother    Hyperlipidemia Father    Hypertension Father    Diabetes Father  Hypertension Maternal Grandmother    Hypertension Maternal Grandfather    Cancer Maternal Grandfather    Hypertension Paternal Grandmother    Heart failure Paternal Grandmother    Hypertension Paternal Grandfather    Liver disease Neg Hx     Prior to Admission medications   Medication Sig Start Date End Date Taking? Authorizing Provider  dabigatran (PRADAXA) 150 MG CAPS capsule Take 1  capsule (150 mg total) by mouth every 12 (twelve) hours. 12/24/23  Yes Danford, Earl Lites, MD  ergocalciferol (VITAMIN D2) 1.25 MG (50000 UT) capsule Take 50,000 Units by mouth once a week. Mondays 02/08/24  Yes [provider]  ezetimibe (ZETIA) 10 MG tablet Take 10 mg by mouth daily. 02/08/24  Yes [provider]  folic acid (FOLVITE) 400 MCG tablet Take 0.5 tablets by mouth daily. 02/08/24  Yes [provider]  insulin degludec (TRESIBA FLEXTOUCH) 100 UNIT/ML FlexTouch Pen Inject 26 Units into the skin daily.   Yes [provider]  insulin lispro (HUMALOG KWIKPEN) 100 UNIT/ML KwikPen Inject into the skin as directed. per sliding scale Subcutaneous three times per day for 30 days 03/08/23  Yes [provider]  levothyroxine (SYNTHROID) 50 MCG tablet Take 50 mcg by mouth daily. 02/05/24  Yes [provider]  Potassium (POTASSIMIN PO) Take 1 tablet by mouth every evening.   Yes [provider]  Continuous Glucose Sensor (FREESTYLE LIBRE 3 PLUS SENSOR) MISC change every 15 days for 30 days    [provider]  desvenlafaxine (PRISTIQ) 25 MG 24 hr tablet Take 25 mg by mouth daily. Patient not taking: Reported on 03/09/2024 02/05/24   [provider]    Physical Exam: Constitutional: Moderately built and nourished. Vitals:   03/09/24 1645 03/09/24 1805 03/09/24 2048 03/09/24 2100  BP: 96/62 110/69 100/64   Pulse: 75 90 64   Resp: 18 17 (!) 21   Temp:    98.1 F (36.7 C)  TempSrc:    Oral  SpO2: 91% 93% 97%   Weight:      Height:       Eyes: Anicteric no pallor. ENMT: No discharge from the ears eyes nose or mouth. Neck: No mass felt.  No neck rigidity. Respiratory: No rhonchi or crepitations. Cardiovascular: S1-S2 heard. Abdomen: Soft nontender bowel sound present. Musculoskeletal: Extensive edema of the left lower extremity from the thigh to the ankle. Skin: No rash.  Erythematous changes on the left lower  extremity. Neurologic: Alert awake oriented to time place and person.  Moves all extremities. Psychiatric: Normal.  Normal affect.   Labs on Admission: I have personally reviewed following labs and imaging studies  CBC: Recent Labs  Lab 03/03/24 0847 03/09/24 1527  WBC 5.4 6.7  NEUTROABS 3.5  --   HGB 14.6 13.8  HCT 40.8 39.6  MCV 102.5* 104.8*  PLT 98* 88*   Basic Metabolic Panel: Recent Labs  Lab 03/03/24 0847 03/09/24 1527  NA 136 133*  K 4.0 3.6  CL 101 96*  CO2 27 21*  GLUCOSE 201* 368*  BUN 10 12  CREATININE 0.70 0.90  CALCIUM 9.0 8.8*   GFR: Estimated Creatinine Clearance: 69.8 mL/min (by C-G formula based on SCr of 0.9 mg/dL). Liver Function Tests: Recent Labs  Lab 03/03/24 0847 03/09/24 1527  AST 30 36  ALT 29 24  ALKPHOS 121 119  BILITOT 1.1 2.1*  PROT 7.5 7.1  ALBUMIN 3.8 3.1*   No results for input(s): "LIPASE", "AMYLASE" in the last 168 hours.  No results for input(s): "AMMONIA" in the last 168 hours. Coagulation Profile: Recent Labs  Lab 03/09/24 1527  INR 1.8*   Cardiac Enzymes: No results for input(s): "CKTOTAL", "CKMB", "CKMBINDEX", "TROPONINI" in the last 168 hours. BNP (last 3 results) No results for input(s): "PROBNP" in the last 8760 hours. HbA1C: No results for input(s): "HGBA1C" in the last 72 hours. CBG: No results for input(s): "GLUCAP" in the last 168 hours. Lipid Profile: No results for input(s): "CHOL", "HDL", "LDLCALC", "TRIG", "CHOLHDL", "LDLDIRECT" in the last 72 hours. Thyroid Function Tests: No results for input(s): "TSH", "T4TOTAL", "FREET4", "T3FREE", "THYROIDAB" in the last 72 hours. Anemia Panel: No results for input(s): "VITAMINB12", "FOLATE", "FERRITIN", "TIBC", "IRON", "RETICCTPCT" in the last 72 hours. Urine analysis:    Component Value Date/Time   COLORURINE STRAW (A) 07/24/2022 2340   APPEARANCEUR CLEAR 07/24/2022 2340   LABSPEC 1.008 07/24/2022 2340   PHURINE 6.0 07/24/2022 2340   GLUCOSEU >=500 (A)  07/24/2022 2340   HGBUR SMALL (A) 07/24/2022 2340   BILIRUBINUR NEGATIVE 07/24/2022 2340   KETONESUR NEGATIVE 07/24/2022 2340   PROTEINUR NEGATIVE 07/24/2022 2340   NITRITE NEGATIVE 07/24/2022 2340   LEUKOCYTESUR NEGATIVE 07/24/2022 2340   Sepsis Labs: @LABRCNTIP (procalcitonin:4,lacticidven:4) )No results found for this or any previous visit (from the past 240 hours).   Radiological Exams on Admission: CT VENOGRAM ABD/PELVIS/LOWER EXT BILAT Result Date: 03/09/2024 CLINICAL DATA:  DVT suspected. History of DVT. Do DVT 7 days ago now with worsening pain to left thigh and groin. EXAM: CT VENOGRAM ABDOMEN AND PELVIS AND LOWER EXTREMITY BILATERAL TECHNIQUE: Venographic phase images of the abdomen, pelvis and lower extremities were obtained following the administration of intravenous contrast. Multiplanar reformats and maximum intensity projections were generated. RADIATION DOSE REDUCTION: This exam was performed according to the departmental dose-optimization program which includes automated exposure control, adjustment of the mA and/or kV according to patient size and/or use of iterative reconstruction technique. CONTRAST:  OMNIPAQUE IOHEXOL 350 MG/ML SOLN COMPARISON:  CT 02/03/2023 FINDINGS: Lower chest: No acute abnormality. Hepatobiliary: Heterogenous perfusion of the liver and nodular contour compatible with cirrhosis. No circumscribed hepatic lesion. Bilobed gallbladder. Cholelithiasis. No evidence of acute cholecystitis. No biliary dilation. Pancreas: Unremarkable. Spleen: Unremarkable. Adrenals/Urinary Tract: Normal adrenal glands. No urinary calculi or hydronephrosis. Unremarkable bladder. Stomach/Bowel: Normal caliber large and small bowel. No bowel wall thickening. Stomach and appendix are within normal limits. Vascular/Lymphatic: Aortic atherosclerotic calcification. Occlusive thrombus in the left common femoral artery prominent periesophageal varices. Recanalized periumbilical vein.  Reproductive: Enhancing nodule in the left anterior uterine fundus is presumed to represent a fibroid. No adnexal mass. Other: No free intraperitoneal fluid or air. Musculoskeletal: No acute fracture.  Swelling throughout the leg. IVC: IVC stent containing nonocclusive thrombus. There is moderate narrowing secondary to the thrombus. Portal and mesenteric veins: Patent. Bilateral iliac veins: Patent right iliac veins. Left common iliac vein stent is patent. Occlusive thrombus in the distal left external iliac vein extending through the common femoral vein and into the superior superficial femoral vein. Right lower extremity: No DVT in the right lower extremity. Left lower extremity: Occlusive thrombus in the left common femoral artery and superior superficial femoral artery. Additional short segment occlusive thrombus in the distal superficial femoral artery (series 6/image 127). Patent popliteal vein. IMPRESSION: 1. IVC stent containing nonocclusive thrombus. There is moderate narrowing secondary to the thrombus. 2. Occlusive thrombus in the distal left external iliac vein extending through the common femoral vein and into the superior superficial femoral vein. Additional short segment  occlusive thrombus in the distal left superficial femoral vein. 3. Cirrhosis with sequela of portal hypertension including esophageal varices. 4. Cholelithiasis. 5. Aortic Atherosclerosis (ICD10-I70.0). Critical Value/emergent results were called by telephone at the time of interpretation on 03/09/2024 at 7:41 pm to provider BROOKE SMALL , who verbally acknowledged these results. Electronically Signed   By: Minerva Fester M.D.   On: 03/09/2024 19:43      Assessment/Plan Principal Problem:   DVT (deep venous thrombosis) (HCC) Active Problems:   Type 2 diabetes mellitus without complication (HCC)   Hyponatremia   Primary hypercoagulable state (HCC)   Macrocytosis, subclinical hypothyroidism   Thrombocytopenia (HCC)     DVT of the left lower extremity with prior history of extensive DVT and PE with protein C and S deficiency.  Patient was on Pradaxa prior to this admission and has been compliant with her medications.  After discussing with Dr. Arlan Organ oncologist patient has been started on heparin infusion.  Dr. Lenell Antu vascular surgeon is planning to do venous thrombectomy in the morning.  Will keep patient n.p.o. past midnight.  See HPI for further details. Diabetes mellitus type 2 with hyperglycemia takes [place] 26 units in the morning last hemoglobin A1c was 8 on 12/21/2023.  For now I have kept patient on sliding scale every 4 hourly.  After patient starts eating we will restart her home dose of [place]. 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  Appears compensated. Hypothyroidism on Synthroid. Macrocytosis during last admission patient was having low normal B12 levels and was advised B12 supplements.  Will check B12 levels. Thrombocytopenia could be from liver cirrhosis.  HIT panel was negative during last admission in December 2024.  Since patient has DVT with possible needing for thrombectomy he will need more than 2 midnight stay.   DVT prophylaxis: Heparin infusion. Code Status: Full code. Family Communication: Discussed with patient. Disposition Plan: Monitored bed. Consults called: Neurologist and vascular surgery. Admission status: Vision.

## 2024-03-09 NOTE — H&P (View-Only) (Signed)
 [facility] [place]  ASSESSMENT / PLAN: 53 y.o. 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 She has a strong personally history of VTE. She has protein C and S deficiencies. She has well-compensated cirrhosis. She has been managed on Pradaxa by Dr. Myna Hidalgo, and reports good compliance. Please keep NPO at midnight. Will arrange for venous thrombectomy tomorrow if schedule can accommodate.   CHIEF COMPLAINT: left leg swelling and pain  HISTORY OF PRESENT ILLNESS: Shelby Conner is a 53 y.o. female well known to our service having undergone complex venous intervention for iliocaval DVT in February 2024 with multiple members of our staff. She did well postprocedure, but was lost to follow up in our office. She has been following with heme/onc. She has been managed with long term Pradaxa, with which she has been compliant. An outside venous duplex which is not available to me showed left leg dvt. The patient reports a several day history of pain, swelling, and warmth typical of prior episodes of DVT.  Past Medical History:  Diagnosis Date   Diabetes mellitus without complication (HCC)    Fatty liver    Hyperlipidemia    Protein C deficiency (HCC)    Protein S deficiency (HCC)     Past Surgical History:  Procedure Laterality Date   ANKLE SURGERY Right    BIOPSY  11/16/2020   Procedure: BIOPSY;  Surgeon: Kathi Der, MD;  Location: [facility] ENDOSCOPY;  Service: Gastroenterology;;   CESAREAN SECTION     ESOPHAGOGASTRODUODENOSCOPY N/A 11/16/2020   Procedure: ESOPHAGOGASTRODUODENOSCOPY (EGD);  Surgeon: Kathi Der, MD;  Location: [facility] ENDOSCOPY;  Service: Gastroenterology;  Laterality: N/A;   INSERTION OF ILIAC STENT N/A 01/30/2023   Procedure: INSERTION OF InternalVena Cava Stent;  Surgeon: Maeola Harman, MD;  Location: [facility] OR;  Service: Vascular;  Laterality: N/A;   IR PARACENTESIS  11/12/2020   KNEE SURGERY Left    PERCUTANEOUS VENOUS  THROMBECTOMY,LYSIS WITH INTRAVASCULAR ULTRASOUND (IVUS) N/A 01/30/2023   Procedure: PERCUTANEOUS VENOUS THROMBECTOMY AND LYSIS WITH INTRAVASCULAR ULTRASOUND (IVUS);  Surgeon: Maeola Harman, MD;  Location: [facility] OR;  Service: Vascular;  Laterality: N/A;   VENOGRAM N/A 01/30/2023   Procedure: [name];  Surgeon: Maeola Harman, MD;  Location: [facility] OR;  Service: Vascular;  Laterality: N/A;    Family History  Problem Relation Age of Onset   Hypertension Mother    Rheum arthritis Mother    Hyperlipidemia Father    Hypertension Father    Diabetes Father    Hypertension Maternal Grandmother    Hypertension Maternal Grandfather    Cancer Maternal Grandfather    Hypertension Paternal Grandmother    Heart failure Paternal Grandmother    Hypertension Paternal Grandfather    Liver disease Neg Hx     Social History   Socioeconomic History   Marital status: Married    Spouse name: Not on file   Number of children: Not on file   Years of education: Not on file   Highest education level: Not on file  Occupational History   Not on file  Tobacco Use   Smoking status: Former   Smokeless tobacco: Never  Vaping Use   Vaping status: Never Used  Substance and Sexual Activity   Alcohol use: Yes    Comment: occasionally   Drug use: Never   Sexual activity: Yes    Partners: Male    Birth control/protection: None  Other Topics Concern   Not on file  Social History Narrative   Not on file  Social Drivers of Corporate investment banker Strain: Not on file  Food Insecurity: No Food Insecurity (03/09/2024)   Hunger Vital Sign    Worried About Running Out of Food in the Last Year: Never true    Ran Out of Food in the Last Year: Never true  Transportation Needs: No Transportation Needs (03/09/2024)   PRAPARE - Administrator, Civil Service (Medical): No    Lack of Transportation (Non-Medical): No  Physical Activity: Not on file  Stress: Not on file  Social  Connections: Unknown (05/09/2022)   [facility]   Social Network    Social Network: Not on file  Intimate Partner Violence: Not At Risk (03/09/2024)   Humiliation, Afraid, Rape, and Kick questionnaire    Fear of Current or Ex-Partner: No    Emotionally Abused: No    Physically Abused: No    Sexually Abused: No    Allergies  Allergen Reactions   Morphine Itching    Per pt     Current Facility-Administered Medications  Medication Dose Route Frequency Provider Last Rate Last Admin   heparin ADULT infusion 100 units/mL (25000 units/247mL)  1,200 Units/hr Intravenous Continuous Gwyneth Sprout, MD 12 mL/hr at 03/09/24 2107 1,200 Units/hr at 03/09/24 2107   HYDROmorphone (DILAUDID) injection 0.5 mg  0.5 mg Intravenous Once Eduard Clos, MD       Current Outpatient Medications  Medication Sig Dispense Refill   dabigatran (PRADAXA) 150 MG CAPS capsule Take 1 capsule (150 mg total) by mouth every 12 (twelve) hours. 60 capsule 0   folic acid (FOLVITE) 400 MCG tablet Take 0.5 tablets by mouth daily.     insulin degludec (TRESIBA FLEXTOUCH) 100 UNIT/ML FlexTouch Pen Inject 26 Units into the skin daily.     insulin lispro (HUMALOG KWIKPEN) 100 UNIT/ML KwikPen Inject into the skin as directed. per sliding scale Subcutaneous three times per day for 30 days     levothyroxine (SYNTHROID) 50 MCG tablet Take 50 mcg by mouth daily.     Potassium (POTASSIMIN PO) Take 1 tablet by mouth every evening.     Continuous Glucose Sensor (FREESTYLE LIBRE 3 PLUS SENSOR) MISC change every 15 days for 30 days     desvenlafaxine (PRISTIQ) 25 MG 24 hr tablet Take 25 mg by mouth daily. (Patient not taking: Reported on 03/09/2024)     ergocalciferol (VITAMIN D2) 1.25 MG (50000 UT) capsule Take 50,000 Units by mouth once a week.     ezetimibe (ZETIA) 10 MG tablet Take 10 mg by mouth daily. (Patient not taking: Reported on 03/09/2024)      PHYSICAL EXAM Vitals:   03/09/24 1645  03/09/24 1805 03/09/24 2048 03/09/24 2100  BP: 96/62 110/69 100/64   Pulse: 75 90 64   Resp: 18 17 (!) 21   Temp:    98.1 F (36.7 C)  TempSrc:    Oral  SpO2: 91% 93% 97%   Weight:      Height:       Middle aged woman in no distress Regular rate and rhythm Unlabored breathing 2+ pitting edema in LLE with warmth.  Patient has motor / sensory function in the foot   PERTINENT LABORATORY AND RADIOLOGIC DATA  Most recent CBC    Latest Ref Rng & Units 03/09/2024    3:27 PM 03/03/2024    8:47 AM 12/24/2023    2:55 AM  CBC  WBC 4.0 - 10.5 K/uL 6.7  5.4  5.2  Hemoglobin 12.0 - 15.0 g/dL 45.4  09.8  11.9   Hematocrit 36.0 - 46.0 % 39.6  40.8  45.3   Platelets 150 - 400 K/uL 88  98  128      Most recent CMP    Latest Ref Rng & Units 03/09/2024    3:27 PM 03/03/2024    8:47 AM 12/23/2023    2:27 AM  CMP  Glucose 70 - 99 mg/dL 147  829  96   BUN 6 - 20 mg/dL 12  10  6    Creatinine 0.44 - 1.00 mg/dL 5.62  1.30  8.65   Sodium 135 - 145 mmol/L 133  136  136   Potassium 3.5 - 5.1 mmol/L 3.6  4.0  4.1   Chloride 98 - 111 mmol/L 96  101  102   CO2 22 - 32 mmol/L 21  27  25    Calcium 8.9 - 10.3 mg/dL 8.8  9.0  9.2   Total Protein 6.5 - 8.1 g/dL 7.1  7.5  7.4   Total Bilirubin 0.0 - 1.2 mg/dL 2.1  1.1  1.7   Alkaline Phos 38 - 126 U/L 119  121  123   AST 15 - 41 U/L 36  30  70   ALT 0 - 44 U/L 24  29  45     Renal function Estimated Creatinine Clearance: 69.8 mL/min (by C-G formula based on SCr of 0.9 mg/dL).  Hgb A1c MFr Bld (%)  Date Value  12/20/2023 8.0 (H)   CT venogram shows IVC stent has non-occlusive thrombus; there is occlusive thrombus in the left external iliac vein, common femoral vein, and femoral vein. Liver looks OK, but esophageal varices and recanalized umbilical vein noted c/w portal HTN  Rande Brunt. Lenell Antu, MD [facility] Phone Number: [phone] 03/09/2024 9:18 PM   Total time spent on preparing this  encounter including chart review, data review, collecting history, examining the patient, coordinating care for this established patient, 60 minutes.  Portions of this report may have been transcribed using voice recognition software.  Every effort has been made to ensure accuracy; however, inadvertent computerized transcription errors may still be present.

## 2024-03-09 NOTE — Progress Notes (Signed)
 ANTICOAGULATION CONSULT NOTE  Pharmacy Consult for Heparin Indication: DVT despite Pradaxa therapy  Allergies  Allergen Reactions   Morphine Itching    Per pt     Patient Measurements: Height: 5\' 2"  (157.5 cm) Weight: 74.4 kg (164 lb 0.4 oz) IBW/kg (Calculated) : 50.1 Heparin Dosing Weight: 66.2 kg  Vital Signs: Temp: 98.7 F (37.1 C) (03/16 1456) BP: 110/69 (03/16 1805) Pulse Rate: 90 (03/16 1805)  Labs: Recent Labs    03/09/24 1527  HGB 13.8  HCT 39.6  PLT 88*  LABPROT 20.7*  INR 1.8*  CREATININE 0.90    Estimated Creatinine Clearance: 69.8 mL/min (by C-G formula based on SCr of 0.9 mg/dL).   Medical History: Past Medical History:  Diagnosis Date   Diabetes mellitus without complication (HCC)    Fatty liver    Hyperlipidemia    Protein C deficiency (HCC)    Protein S deficiency (HCC)     Medications:  (Not in a hospital admission)  Scheduled:  Infusions:  PRN:   Assessment: 35 yof with a history of protein S and C deficiency, DVT, PE, IVC thrombosis . Patient is presenting with worsening pain to the L thigh with color changes to the thigh. Heparin per pharmacy consult placed for   DVT despite Pradaxa therapy .  Recently patient placed on Pradaxa after failing enoxaparin therapy and being on argatroban and arixtra inpatient. Oncology and vascular following both would like heparin for now.  Last Pradaxa dose 3/16AM ~0700.  Hgb 13.8; plt 88  Goal of Therapy:  Heparin level 0.3-0.7 units/ml Monitor platelets by anticoagulation protocol: Yes   Plan:  Give IV heparin 5000 units bolus x 1 Start heparin infusion at 1200 units/hr Check anti-Xa level in 6 hours and daily while on heparin Continue to monitor H&H and platelets  Delmar Landau, PharmD, BCPS 03/09/2024 8:33 PM ED Clinical Pharmacist -  [phone]

## 2024-03-09 NOTE — ED Notes (Signed)
 The  charge nurse is getting a bed for this pt in the back

## 2024-03-09 NOTE — ED Provider Notes (Signed)
 [place] EMERGENCY DEPARTMENT AT [facility] Provider Note   CSN: [id number] Arrival date & time: 03/09/24  1447     History  Chief Complaint  Patient presents with   blood clot    Bobbiejo Ishikawa is a 53 y.o. female, hx of protein S and C deficiency, DVT, PE, who presents to the ED 2/2 to worsening pain to the L thigh with color changes to the thigh. She reports that her upper thigh has been hurting more since last Wednesday. Has been changed to pradaxa from her previous coumadin and has been compliant. She denies any chest pain or shortness of breath. Notes her leg has become more edematous and tight so she decided to come to the ED.   Hx of iliac stent and IVC filter (now removed).   Home Medications Prior to Admission medications   Medication Sig Start Date End Date Taking? Authorizing Provider  dabigatran (PRADAXA) 150 MG CAPS capsule Take 1 capsule (150 mg total) by mouth every 12 (twelve) hours. 12/24/23  Yes Danford, Earl Lites, MD  ergocalciferol (VITAMIN D2) 1.25 MG (50000 UT) capsule Take 50,000 Units by mouth once a week. Mondays 02/08/24  Yes [provider]  ezetimibe (ZETIA) 10 MG tablet Take 10 mg by mouth daily. 02/08/24  Yes [provider]  folic acid (FOLVITE) 400 MCG tablet Take 0.5 tablets by mouth daily. 02/08/24  Yes [provider]  insulin degludec (TRESIBA FLEXTOUCH) 100 UNIT/ML FlexTouch Pen Inject 26 Units into the skin daily.   Yes [provider]  insulin lispro (HUMALOG KWIKPEN) 100 UNIT/ML KwikPen Inject into the skin as directed. per sliding scale Subcutaneous three times per day for 30 days 03/08/23  Yes [provider]  levothyroxine (SYNTHROID) 50 MCG tablet Take 50 mcg by mouth daily. 02/05/24  Yes [provider]  Potassium (POTASSIMIN PO) Take 1 tablet by mouth every evening.   Yes [provider]  Continuous Glucose Sensor (FREESTYLE LIBRE 3 PLUS SENSOR) MISC change  every 15 days for 30 days    [provider]  desvenlafaxine (PRISTIQ) 25 MG 24 hr tablet Take 25 mg by mouth daily. Patient not taking: Reported on 03/09/2024 02/05/24   [provider]      Allergies    Morphine    Review of Systems   Review of Systems  Cardiovascular:  Positive for leg swelling. Negative for chest pain.    Physical Exam Updated Vital Signs BP 100/64   Pulse 64   Temp 98.1 F (36.7 C) (Oral)   Resp (!) 21   Ht 5\' 2"  (1.575 m)   Wt 74.4 kg   SpO2 97%   BMI 30.00 kg/m  Physical Exam Vitals and nursing note reviewed.  Constitutional:      General: She is not in acute distress.    Appearance: She is well-developed.  HENT:     Head: Normocephalic and atraumatic.  Eyes:     Conjunctiva/sclera: Conjunctivae normal.  Cardiovascular:     Rate and Rhythm: Normal rate and regular rhythm.     Heart sounds: No murmur heard.    Comments: +edematous and slightly erythematous LLE w/palpable dorsalis pedis pulse. Significant discomfort upon palpation to L groin.   Pulmonary:     Effort: Pulmonary effort is normal. No respiratory distress.     Breath sounds: Normal breath sounds.  Abdominal:     Palpations: Abdomen is soft.     Tenderness: There is no abdominal tenderness.  Musculoskeletal:  General: No swelling.     Cervical back: Neck supple.     Left lower leg: 2+ Edema present.  Skin:    General: Skin is warm and dry.     Capillary Refill: Capillary refill takes less than 2 seconds.  Neurological:     Mental Status: She is alert.  Psychiatric:        Mood and Affect: Mood normal.     ED Results / Procedures / Treatments   Labs (all labs ordered are listed, but only abnormal results are displayed) Labs Reviewed  COMPREHENSIVE METABOLIC PANEL - Abnormal; Notable for the following components:      Result Value   Sodium 133 (*)    Chloride 96 (*)    CO2 21 (*)    Glucose, Bld 368 (*)    Calcium 8.8 (*)    Albumin 3.1 (*)     Total Bilirubin 2.1 (*)    Anion gap 16 (*)    All other components within normal limits  CBC - Abnormal; Notable for the following components:   RBC 3.78 (*)    MCV 104.8 (*)    MCH 36.5 (*)    Platelets 88 (*)    All other components within normal limits  PROTIME-INR - Abnormal; Notable for the following components:   Prothrombin Time 20.7 (*)    INR 1.8 (*)    All other components within normal limits  HEPARIN LEVEL (UNFRACTIONATED) - Abnormal; Notable for the following components:   Heparin Unfractionated <0.10 (*)    All other components within normal limits  APTT - Abnormal; Notable for the following components:   aPTT 42 (*)    All other components within normal limits  HEPARIN LEVEL (UNFRACTIONATED)  CBC    EKG None  Radiology CT VENOGRAM ABD/PELVIS/LOWER EXT BILAT Result Date: 03/09/2024 CLINICAL DATA:  DVT suspected. History of DVT. Do DVT 7 days ago now with worsening pain to left thigh and groin. EXAM: CT VENOGRAM ABDOMEN AND PELVIS AND LOWER EXTREMITY BILATERAL TECHNIQUE: Venographic phase images of the abdomen, pelvis and lower extremities were obtained following the administration of intravenous contrast. Multiplanar reformats and maximum intensity projections were generated. RADIATION DOSE REDUCTION: This exam was performed according to the departmental dose-optimization program which includes automated exposure control, adjustment of the mA and/or kV according to patient size and/or use of iterative reconstruction technique. CONTRAST:  OMNIPAQUE IOHEXOL 350 MG/ML SOLN COMPARISON:  CT 02/03/2023 FINDINGS: Lower chest: No acute abnormality. Hepatobiliary: Heterogenous perfusion of the liver and nodular contour compatible with cirrhosis. No circumscribed hepatic lesion. Bilobed gallbladder. Cholelithiasis. No evidence of acute cholecystitis. No biliary dilation. Pancreas: Unremarkable. Spleen: Unremarkable. Adrenals/Urinary Tract: Normal adrenal glands. No urinary  calculi or hydronephrosis. Unremarkable bladder. Stomach/Bowel: Normal caliber large and Cacie Gaskins bowel. No bowel wall thickening. Stomach and appendix are within normal limits. Vascular/Lymphatic: Aortic atherosclerotic calcification. Occlusive thrombus in the left common femoral artery prominent periesophageal varices. Recanalized periumbilical vein. Reproductive: Enhancing nodule in the left anterior uterine fundus is presumed to represent a fibroid. No adnexal mass. Other: No free intraperitoneal fluid or air. Musculoskeletal: No acute fracture.  Swelling throughout the leg. IVC: IVC stent containing nonocclusive thrombus. There is moderate narrowing secondary to the thrombus. Portal and mesenteric veins: Patent. Bilateral iliac veins: Patent right iliac veins. Left common iliac vein stent is patent. Occlusive thrombus in the distal left external iliac vein extending through the common femoral vein and into the superior superficial femoral vein. Right lower extremity: No DVT  in the right lower extremity. Left lower extremity: Occlusive thrombus in the left common femoral artery and superior superficial femoral artery. Additional short segment occlusive thrombus in the distal superficial femoral artery (series 6/image 127). Patent popliteal vein. IMPRESSION: 1. IVC stent containing nonocclusive thrombus. There is moderate narrowing secondary to the thrombus. 2. Occlusive thrombus in the distal left external iliac vein extending through the common femoral vein and into the superior superficial femoral vein. Additional short segment occlusive thrombus in the distal left superficial femoral vein. 3. Cirrhosis with sequela of portal hypertension including esophageal varices. 4. Cholelithiasis. 5. Aortic Atherosclerosis (ICD10-I70.0). Critical Value/emergent results were called by telephone at the time of interpretation on 03/09/2024 at 7:41 pm to provider Elazar Argabright , who verbally acknowledged these results.  Electronically Signed   By: Minerva Fester M.D.   On: 03/09/2024 19:43    Procedures Procedures    Medications Ordered in ED Medications  heparin ADULT infusion 100 units/mL (25000 units/2101mL) (1,200 Units/hr Intravenous New Bag/Given 03/09/24 2107)  HYDROmorphone (DILAUDID) injection 1 mg (1 mg Intravenous Given 03/09/24 1636)  oxyCODONE-acetaminophen (PERCOCET/ROXICET) 5-325 MG per tablet 1 tablet (1 tablet Oral Given 03/09/24 1827)  iohexol (OMNIPAQUE) 350 MG/ML injection 125 mL (125 mLs Intravenous Contrast Given 03/09/24 1927)  heparin bolus via infusion 5,000 Units (5,000 Units Intravenous Bolus from Bag 03/09/24 2107)  HYDROmorphone (DILAUDID) injection 0.5 mg (0.5 mg Intravenous Given 03/09/24 2155)    ED Course/ Medical Decision Making/ A&P                                 Medical Decision Making Patient is a 53 year old female, here for leg pain, this been going on for the last week.  She has a known DVT, and an IVC filter.  Her leg is more swollen than usual, today and feels very tender, around her thigh.  It is extremely edematous, and erythematous on my exam with good pulse.  I spoke with vascular surgery Dr. Lenell Antu, and he states that CTV would be helpful, I ordered the CTV, provided Dilaudid for her pain.  She has been compliant with her blood thinner.  Amount and/or Complexity of Data Reviewed Labs: ordered.    Details: Unremarkable labs Radiology: ordered.    Details: CTV shows IVC stent containing nonocclusive thrombus, as well as occlusive thrombus in the distal left external iliac vein that is occlusive Discussion of management or test interpretation with external provider(s): Discussed with Dr. Juanetta Gosling, and he states that patient will likely have to have a procedure tomorrow, and that vascular will see her later today or in the AM.  He recommends starting her on heparin and admitting her to the hospitalist.  I spoke with Dr. Myna Hidalgo, and he recommends heparin, for the  patient.  Oncology was consulted secondary to request by pharmacy, given patient's multiple failures, with anticoagulants.  We will proceed with heparin per oncology's recommendations.  Admitted to Dr. Toniann Fail.   Risk Prescription drug management. Decision regarding hospitalization.    Final Clinical Impression(s) / ED Diagnoses Final diagnoses:  Occlusive thrombus  Deep vein thrombosis (DVT) of iliac vein of left lower extremity, unspecified chronicity [facility])    Rx / DC Orders ED Discharge Orders     None         Habiba Treloar, Harley Alto, PA 03/09/24 2322    Gwyneth Sprout, MD 03/12/24 [phone]

## 2024-03-10 ENCOUNTER — Inpatient Hospital Stay (HOSPITAL_COMMUNITY)
Admission: EM | Disposition: A | Discharge status: Home / Self Care | Payer: Self-pay | Source: Home / Self Care | Attending: Internal Medicine

## 2024-03-10 DIAGNOSIS — Z79890 Hormone replacement therapy (postmenopausal): Secondary | ICD-10-CM | POA: Diagnosis not present

## 2024-03-10 DIAGNOSIS — I82422 Acute embolism and thrombosis of left iliac vein: Secondary | ICD-10-CM | POA: Diagnosis present

## 2024-03-10 DIAGNOSIS — I82220 Acute embolism and thrombosis of inferior vena cava: Secondary | ICD-10-CM | POA: Diagnosis present

## 2024-03-10 DIAGNOSIS — D6859 Other primary thrombophilia: Secondary | ICD-10-CM | POA: Diagnosis present

## 2024-03-10 DIAGNOSIS — L03114 Cellulitis of left upper limb: Secondary | ICD-10-CM | POA: Diagnosis present

## 2024-03-10 DIAGNOSIS — E1165 Type 2 diabetes mellitus with hyperglycemia: Secondary | ICD-10-CM | POA: Diagnosis present

## 2024-03-10 DIAGNOSIS — Z6830 Body mass index (BMI) 30.0-30.9, adult: Secondary | ICD-10-CM | POA: Diagnosis not present

## 2024-03-10 DIAGNOSIS — E038 Other specified hypothyroidism: Secondary | ICD-10-CM | POA: Diagnosis present

## 2024-03-10 DIAGNOSIS — I8290 Acute embolism and thrombosis of unspecified vein: Secondary | ICD-10-CM | POA: Diagnosis not present

## 2024-03-10 DIAGNOSIS — Z794 Long term (current) use of insulin: Secondary | ICD-10-CM | POA: Diagnosis not present

## 2024-03-10 DIAGNOSIS — E785 Hyperlipidemia, unspecified: Secondary | ICD-10-CM | POA: Diagnosis present

## 2024-03-10 DIAGNOSIS — Z95820 Peripheral vascular angioplasty status with implants and grafts: Secondary | ICD-10-CM | POA: Diagnosis not present

## 2024-03-10 DIAGNOSIS — I82412 Acute embolism and thrombosis of left femoral vein: Secondary | ICD-10-CM

## 2024-03-10 DIAGNOSIS — E871 Hypo-osmolality and hyponatremia: Secondary | ICD-10-CM | POA: Diagnosis present

## 2024-03-10 DIAGNOSIS — T82898A Other specified complication of vascular prosthetic devices, implants and grafts, initial encounter: Secondary | ICD-10-CM | POA: Diagnosis present

## 2024-03-10 DIAGNOSIS — D7589 Other specified diseases of blood and blood-forming organs: Secondary | ICD-10-CM | POA: Diagnosis present

## 2024-03-10 DIAGNOSIS — Y831 Surgical operation with implant of artificial internal device as the cause of abnormal reaction of the patient, or of later complication, without mention of misadventure at the time of the procedure: Secondary | ICD-10-CM | POA: Diagnosis present

## 2024-03-10 DIAGNOSIS — K766 Portal hypertension: Secondary | ICD-10-CM | POA: Diagnosis present

## 2024-03-10 DIAGNOSIS — I82492 Acute embolism and thrombosis of other specified deep vein of left lower extremity: Secondary | ICD-10-CM | POA: Diagnosis not present

## 2024-03-10 DIAGNOSIS — I8510 Secondary esophageal varices without bleeding: Secondary | ICD-10-CM | POA: Diagnosis present

## 2024-03-10 DIAGNOSIS — E66811 Obesity, class 1: Secondary | ICD-10-CM | POA: Diagnosis present

## 2024-03-10 DIAGNOSIS — K7030 Alcoholic cirrhosis of liver without ascites: Secondary | ICD-10-CM | POA: Diagnosis present

## 2024-03-10 DIAGNOSIS — Z87891 Personal history of nicotine dependence: Secondary | ICD-10-CM | POA: Diagnosis not present

## 2024-03-10 DIAGNOSIS — D6959 Other secondary thrombocytopenia: Secondary | ICD-10-CM | POA: Diagnosis present

## 2024-03-10 DIAGNOSIS — Z7901 Long term (current) use of anticoagulants: Secondary | ICD-10-CM | POA: Diagnosis not present

## 2024-03-10 DIAGNOSIS — K7460 Unspecified cirrhosis of liver: Secondary | ICD-10-CM | POA: Diagnosis present

## 2024-03-10 DIAGNOSIS — K7581 Nonalcoholic steatohepatitis (NASH): Secondary | ICD-10-CM | POA: Diagnosis present

## 2024-03-10 HISTORY — PX: PERIPHERAL VASCULAR ULTRASOUND/IVUS: CATH118334

## 2024-03-10 HISTORY — PX: PERIPHERAL VASCULAR THROMBECTOMY: CATH118306

## 2024-03-10 LAB — CBC
HCT: 37.1 % (ref 36.0–46.0)
Hemoglobin: 12.9 g/dL (ref 12.0–15.0)
MCH: 36.1 pg — ABNORMAL HIGH (ref 26.0–34.0)
MCHC: 34.8 g/dL (ref 30.0–36.0)
MCV: 103.9 fL — ABNORMAL HIGH (ref 80.0–100.0)
Platelets: 93 10*3/uL — ABNORMAL LOW (ref 150–400)
RBC: 3.57 MIL/uL — ABNORMAL LOW (ref 3.87–5.11)
RDW: 14 % (ref 11.5–15.5)
WBC: 5.6 10*3/uL (ref 4.0–10.5)
nRBC: 0 % (ref 0.0–0.2)

## 2024-03-10 LAB — GLUCOSE, CAPILLARY
Glucose-Capillary: 128 mg/dL — ABNORMAL HIGH (ref 70–99)
Glucose-Capillary: 85 mg/dL (ref 70–99)
Glucose-Capillary: 104 mg/dL — ABNORMAL HIGH (ref 70–99)

## 2024-03-10 LAB — BASIC METABOLIC PANEL
Anion gap: 9 (ref 5–15)
BUN: 9 mg/dL (ref 6–20)
CO2: 26 mmol/L (ref 22–32)
Calcium: 8.7 mg/dL — ABNORMAL LOW (ref 8.9–10.3)
Chloride: 101 mmol/L (ref 98–111)
Creatinine, Ser: 0.81 mg/dL (ref 0.44–1.00)
GFR, Estimated: >60 mL/min (ref >60)
Glucose, Bld: 159 mg/dL — ABNORMAL HIGH (ref 70–99)
Potassium: 3.3 mmol/L — ABNORMAL LOW (ref 3.5–5.1)
Sodium: 136 mmol/L (ref 135–145)

## 2024-03-10 LAB — CBG MONITORING, ED
Glucose-Capillary: 113 mg/dL — ABNORMAL HIGH (ref 70–99)
Glucose-Capillary: 121 mg/dL — ABNORMAL HIGH (ref 70–99)
Glucose-Capillary: 127 mg/dL — ABNORMAL HIGH (ref 70–99)
Glucose-Capillary: 181 mg/dL — ABNORMAL HIGH (ref 70–99)

## 2024-03-10 LAB — VITAMIN B12: Vitamin B-12: 165 pg/mL — ABNORMAL LOW (ref 180–914)

## 2024-03-10 LAB — HEPARIN LEVEL (UNFRACTIONATED)
Heparin Unfractionated: 0.31 [IU]/mL (ref 0.30–0.70)
Heparin Unfractionated: 0.19 [IU]/mL — ABNORMAL LOW (ref 0.30–0.70)

## 2024-03-10 LAB — HIV ANTIBODY (ROUTINE TESTING W REFLEX): HIV Screen 4th Generation wRfx: NONREACTIVE

## 2024-03-10 MED ORDER — HYDRALAZINE HCL 20 MG/ML IJ SOLN: 5.0000 mg | INTRAMUSCULAR | Status: DC | PRN

## 2024-03-10 MED ORDER — FENTANYL CITRATE (PF) 100 MCG/2ML IJ SOLN
INTRAMUSCULAR | Status: DC | PRN
  Administered 2024-03-10: 25 ug via INTRAVENOUS
  Administered 2024-03-10: 50 ug via INTRAVENOUS
  Administered 2024-03-10: 25 ug via INTRAVENOUS

## 2024-03-10 MED ORDER — HEPARIN (PORCINE) IN NACL 1000-0.9 UT/500ML-% IV SOLN
INTRAVENOUS | Status: DC | PRN
  Administered 2024-03-10: 500 mL

## 2024-03-10 MED ORDER — IODIXANOL 320 MG/ML IV SOLN
INTRAVENOUS | Status: DC | PRN
  Administered 2024-03-10: 60 mL

## 2024-03-10 MED ORDER — HEPARIN SODIUM (PORCINE) 1000 UNIT/ML IJ SOLN
INTRAMUSCULAR | Status: DC | PRN
  Administered 2024-03-10: 6000 [IU] via INTRAVENOUS

## 2024-03-10 MED ORDER — HYDROMORPHONE HCL 1 MG/ML IJ SOLN
1.0000 mg | INTRAMUSCULAR | Status: DC | PRN
  Administered 2024-03-10 – 2024-03-13 (×16): 1 mg via INTRAVENOUS
  Filled 2024-03-10 (×16): qty 1

## 2024-03-10 MED ORDER — ONDANSETRON HCL 4 MG/2ML IJ SOLN: 4.0000 mg | Freq: Four times a day (QID) | INTRAMUSCULAR | Status: DC | PRN

## 2024-03-10 MED ORDER — FENTANYL CITRATE PF 50 MCG/ML IJ SOSY
25.0000 ug | PREFILLED_SYRINGE | INTRAMUSCULAR | Status: DC | PRN
  Administered 2024-03-10 (×3): 25 ug via INTRAVENOUS
  Filled 2024-03-10 (×3): qty 1

## 2024-03-10 MED ORDER — ACETAMINOPHEN 325 MG PO TABS: 650.0000 mg | ORAL_TABLET | ORAL | Status: DC | PRN

## 2024-03-10 MED ORDER — HEPARIN BOLUS VIA INFUSION
2000.0000 [IU] | Freq: Once | INTRAVENOUS | Status: AC
  Administered 2024-03-10: 2000 [IU] via INTRAVENOUS
  Filled 2024-03-10: qty 2000

## 2024-03-10 MED ORDER — SODIUM CHLORIDE 0.9 % WEIGHT BASED INFUSION
1.0000 mL/kg/h | INTRAVENOUS | Status: AC
  Administered 2024-03-10: 1 mL/kg/h via INTRAVENOUS

## 2024-03-10 MED ORDER — SODIUM CHLORIDE 0.9 % IV SOLN: 250.0000 mL | INTRAVENOUS | Status: AC | PRN

## 2024-03-10 MED ORDER — HEPARIN SODIUM (PORCINE) 1000 UNIT/ML IJ SOLN
INTRAMUSCULAR | Status: AC
  Filled 2024-03-10: qty 10

## 2024-03-10 MED ORDER — OXYCODONE-ACETAMINOPHEN 5-325 MG PO TABS
1.0000 | ORAL_TABLET | ORAL | Status: DC | PRN
  Administered 2024-03-12 – 2024-03-14 (×8): 1 via ORAL
  Filled 2024-03-10 (×8): qty 1

## 2024-03-10 MED ORDER — FENTANYL CITRATE (PF) 100 MCG/2ML IJ SOLN
INTRAMUSCULAR | Status: AC
  Filled 2024-03-10: qty 2

## 2024-03-10 MED ORDER — LIDOCAINE HCL (PF) 1 % IJ SOLN
INTRAMUSCULAR | Status: AC
  Filled 2024-03-10: qty 30

## 2024-03-10 MED ORDER — SODIUM CHLORIDE 0.9% FLUSH
3.0000 mL | Freq: Two times a day (BID) | INTRAVENOUS | Status: DC
  Administered 2024-03-11 – 2024-03-13 (×6): 3 mL via INTRAVENOUS

## 2024-03-10 MED ORDER — LIDOCAINE HCL (PF) 1 % IJ SOLN
INTRAMUSCULAR | Status: DC | PRN
  Administered 2024-03-10: 5 mL
  Administered 2024-03-10: 15 mL

## 2024-03-10 MED ORDER — SODIUM CHLORIDE 0.9% FLUSH: 3.0000 mL | INTRAVENOUS | Status: DC | PRN

## 2024-03-10 MED ORDER — MIDAZOLAM HCL 2 MG/2ML IJ SOLN
INTRAMUSCULAR | Status: AC
  Filled 2024-03-10: qty 2

## 2024-03-10 MED ORDER — LABETALOL HCL 5 MG/ML IV SOLN: 10.0000 mg | INTRAVENOUS | Status: DC | PRN

## 2024-03-10 MED ORDER — DIPHENHYDRAMINE HCL 50 MG/ML IJ SOLN
12.5000 mg | Freq: Four times a day (QID) | INTRAMUSCULAR | Status: DC | PRN
  Administered 2024-03-10 – 2024-03-13 (×10): 12.5 mg via INTRAVENOUS
  Filled 2024-03-10 (×10): qty 1

## 2024-03-10 MED ORDER — MIDAZOLAM HCL 2 MG/2ML IJ SOLN
INTRAMUSCULAR | Status: DC | PRN
  Administered 2024-03-10 (×4): 1 mg via INTRAVENOUS

## 2024-03-10 NOTE — Op Note (Signed)
 Patient name: Shelby Conner MRN: [id number] DOB: November 20, 1971 Sex: female  03/10/2024 Pre-operative Diagnosis: Extensive left lower extremity DVT with thrombus in the IVC stent and left common iliac vein stent Post-operative diagnosis:  Same Surgeon:  Apolinar Junes C. Randie Heinz, MD Procedure Performed: 1.  Percutaneous ultrasound-guided cannulation left popliteal vein 2.  Left lower extremity and central venogram 3.  Intravascular ultrasound left popliteal, left femoral, left common femoral, left external and common iliac veins and IVC 4.  Percutaneous mechanical thrombectomy using Inari Clottriever XL and bold catheters of IVC, left common and external iliac vein including left common iliac vein stent, left common femoral vein and femoral vein 5.  Balloon venoplasty of left common and external iliac veins and common femoral vein with 10 mm balloon and venoplasty of left femoral vein and popliteal vein with 7 mm balloon 6.  Moderate sedation with fentanyl Versed for 92 minutes   Indications: 53 year old female with a remote history of IVC filter and left common iliac vein stent placement.  1 year ago she underwent removal of the filter with stenting of the IVC for right lower extremity edema.  She now presents with extensive left lower extremity DVT and edema with thrombus in the IVC stent as well as occlusion of the common iliac vein stent in the common femoral vein by CT venogram.  She is now indicated for venography with intravascular ultrasound and possible thrombectomy with possible repeat stenting.  Findings: The popliteal vein was difficult to access.  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  The IVC stent had initially approximately 75% stenosis this was reduced to less than 30% residual stenosis after mechanical thrombectomy there was no ballooning performed of the IVC stent.  The  left common iliac vein stent was also subtotally occluded and after thrombectomy and balloon venoplasty this was reduced to 0% residual stenosis and there was brisk flow through the stent.  Mechanical thrombectomy also removed what appeared to be approximately a 4 cm balloon that was not known to be present within the stents over the veins and was likely from a previous procedure.  There was chronic and subacute thrombus of the common femoral vein which was removed with thrombectomy and balloon venoplasty reduced this to less than 20% residual stenosis and there was a brisk flow channel through there without any reflux into the profunda vein and the femoral vein which was initially occluded and ballooned with 7 mm balloon then demonstrated a brisk flow channel as well without reflux into collateral veins.   Procedure:  The patient was identified in the holding area and taken to room 8.  The patient was then placed prone on the table and prepped and draped in the usual sterile fashion.  A time out was called.  Ultrasound was used to evaluate initially the left small saphenous vein but after multiple unsuccessful attempts to get a wire to pass I then cannulated the popliteal vein but again did not have backbleeding but was able to get a wire to pass under fluoroscopic guidance.  At this time 7000 units of heparin was administered.  I then placed a micropuncture sheath followed by a Glidewire advantage.  I placed an 8 French sheath and performed venography which demonstrated occlusion of the femoral vein.  I was able to cross this occlusion in the mid leg with Glidewire advantage and then  performed balloon venoplasty with 7 mm balloon.  I then placed the Inari 16 French sheath.  This was also ballooned open at the base with a 7 mm balloon.  We then were able to get the Glidewire advantage up to the IVC and perform venography of the IVC and then a Rosen wire initially followed by an Amplatz wire was placed into the  right subclavian vein.  We performed intravascular ultrasound including the IVC, left common and external iliac veins left, left common femoral and femoral veins.  We then began with the clot Treiver XL which was used within the IVC stent and was removed through the common iliac vein stent and the femoral vein not fully deployed.  This was used for 3 separate passes until there was a clear pass and the clot retrieved all appeared chronic and subacute.  We then used the clot Treiver bold in the left common iliac vein stent and left common femoral vein.  We then performed balloon venoplasty of the left common iliac vein stent and left common femoral vein with 10 mm balloon.  Completion demonstrated brisk flow channel through the femoral vein into the common femoral vein on the left and into the left common and external neck veins and IVC.  IVUS demonstrated much improved flow channel through the IVC stent with no residual stenosis in the left common iliac vein stent and a a large flow channel through the common femoral vein and a channel in the femoral vein as well.  Satisfied with this the wire was removed and the cannulation site was suture-ligated with 4-0 Monocryl suture.  The patient tolerated the procedure without any complication.   Contrast: 60 cc   Zitlali Primm C. Randie Heinz, MD [facility] Office: [phone] Pager: [phone]

## 2024-03-10 NOTE — Progress Notes (Signed)
 ANTICOAGULATION CONSULT NOTE  Pharmacy Consult for Heparin Indication: DVT despite Pradaxa therapy  Allergies  Allergen Reactions   Morphine Itching    Per pt     Patient Measurements: Height: 5\' 2"  (157.5 cm) Weight: 74.4 kg (164 lb 0.4 oz) IBW/kg (Calculated) : 50.1 Heparin Dosing Weight: 66.2 kg  Vital Signs: Temp: 98.1 F (36.7 C) (03/17 0039) Temp Source: Oral (03/16 2100) BP: 107/63 (03/17 0130) Pulse Rate: 55 (03/17 0130)  Labs: Recent Labs    03/09/24 1527 03/09/24 2100 03/10/24 0030  HGB 13.8  --  12.9  HCT 39.6  --  37.1  PLT 88*  --  93*  APTT  --  42*  --   LABPROT 20.7*  --   --   INR 1.8*  --   --   HEPARINUNFRC  --  <0.10* 0.31  CREATININE 0.90  --  0.81    Estimated Creatinine Clearance: 77.6 mL/min (by C-G formula based on SCr of 0.81 mg/dL).   Medical History: Past Medical History:  Diagnosis Date   Diabetes mellitus without complication (HCC)    Fatty liver    Hyperlipidemia    Protein C deficiency (HCC)    Protein S deficiency (HCC)     Medications:  (Not in a hospital admission)  Scheduled:   [START ON 03/11/2024] ezetimibe  10 mg Oral Daily   insulin aspart  0-9 Units Subcutaneous Q4H   levothyroxine  50 mcg Oral Daily   Infusions:   heparin 1,200 Units/hr (03/09/24 2107)   PRN: fentaNYL (SUBLIMAZE) injection  Assessment: 53 yof with a history of protein S and C deficiency, DVT, PE, IVC thrombosis . Patient is presenting with worsening pain to the L thigh with color changes to the thigh. Heparin per pharmacy consult placed for   DVT despite Pradaxa therapy .  Recently patient placed on Pradaxa after failing enoxaparin therapy and being on argatroban and arixtra inpatient. Oncology and vascular following both would like heparin for now. Last Pradaxa dose 3/16AM ~0700.  Heparin level 0.31, therapeutic on lower end of range No s/sx bleeding or issues with infusion noted.  Goal of Therapy:  Heparin level 0.3-0.7  units/ml Monitor platelets by anticoagulation protocol: Yes   Plan:  Increase heparin infusion at 1250 units/hr Check anti-Xa level in 6 hours and daily while on heparin Continue to monitor H&H and platelets F/u plans for anticoagulation   Thank you for allowing pharmacy to participate in this patient's care.  Marja Kays, PharmD Emergency Medicine Clinical Pharmacist 03/10/2024,3:00 AM

## 2024-03-10 NOTE — Consult Note (Signed)
 [name] is well-known to me.  She is a very nice 53 year old white female.  She has Protein C deficiency.  She has been on numerous different anticoagulants.  She was recently has been on Pradaxa.  She has been compliant with the Pradaxa.  Unfortunately, she began develop leg swelling over the weekend.  This is of the Left leg.  She subsequently came to the emergency room yesterday.  She had a CT venogram of the lower extremities.  She does have a stent in the IVC.  She had a thrombus in the external iliac vein down to the femoral vein.  Her left leg is firm and swollen and somewhat hyperemic.  She has had no cough.  She has had no shortness of breath.  There is been no chest wall pain.  Her labs show a white count 5.6.  Hemoglobin 12.9.  Platelet count 93,000.  Her sodium 136.  Potassium 3.3.  BUN 9 creatinine 0.81.  Calcium 8.7 with an albumin of 3.1.  On the CT venogram, there is evidence of some cirrhosis.  She has followed at [facility] for this in the past but is not felt to be a transplant candidate.  She currently is on heparin.  She has had no fever.   Her vital signs are temperature of 98.9.  Pulse 54.  Blood pressure 107/63.  Her head neck exam shows no ocular or oral lesions.  There are no palpable cervical or supraclavicular lymph nodes.  Lungs are clear bilaterally.  Cardiac exam regular rate and rhythm.  Abdomen is soft.  Bowel sounds are present.  There is no fluid wave.  There is no palpable liver or spleen tip.  Extremity shows marked edema and firmness of the left leg.  She has good pulses in the left foot.  Right leg is unremarkable.  Neurological exam is nonfocal.   [name] has no other thrombus.  This is in the left leg.  She said this where she has had her past 3 thromboembolic events.  As such, I have to wonder if there is something wrong with the vein and that there might be some vein damage that could be causing turbulence.  I know she has a stent in.  I do not  know if the stent can be increased in diameter.  I would think that she would be a candidate for thrombectomy.  Her left leg certainly is quite swollen and firm.  I am really not worried about the thrombocytopenia.  I really do not think this is going to be a problem for her.  She is on heparin.  I am not sure what we can get her on after the heparin.  She had been on multiple different anticoagulants already.  We will follow along.  We will have to see what Vascular Surgery has to say.  Tedra Senegal, MD  Hebrews 12:12

## 2024-03-10 NOTE — Interval H&P Note (Signed)
 History and Physical Interval Note:  03/10/2024 3:05 PM  Shelby Conner  has presented today for surgery, with the diagnosis of cad.  The various methods of treatment have been discussed with the patient and family. After consideration of risks, benefits and other options for treatment, the patient has consented to  Procedure(s): PERIPHERAL VASCULAR THROMBECTOMY (N/A) as a surgical intervention.  The patient's history has been reviewed, patient examined, no change in status, stable for surgery.  I have reviewed the patient's chart and labs.  Questions were answered to the patient's satisfaction.     Lemar Livings

## 2024-03-10 NOTE — ED Notes (Signed)
 Transport called for pt x2.

## 2024-03-10 NOTE — ED Notes (Signed)
Transport called for pt.

## 2024-03-10 NOTE — Progress Notes (Signed)
 ANTICOAGULATION CONSULT NOTE  Pharmacy Consult for Heparin Indication: DVT despite Pradaxa therapy  Allergies  Allergen Reactions   Morphine Itching    Per pt     Patient Measurements: Height: 5\' 2"  (157.5 cm) Weight: 74.4 kg (164 lb 0.4 oz) IBW/kg (Calculated) : 50.1 Heparin Dosing Weight: 66.2 kg  Vital Signs: Temp: 98.2 F (36.8 C) (03/17 0926) Temp Source: Oral (03/17 0926) BP: 97/62 (03/17 0926) Pulse Rate: 57 (03/17 0926)  Labs: Recent Labs    03/09/24 1527 03/09/24 2100 03/10/24 0030 03/10/24 1028  HGB 13.8  --  12.9  --   HCT 39.6  --  37.1  --   PLT 88*  --  93*  --   APTT  --  42*  --   --   LABPROT 20.7*  --   --   --   INR 1.8*  --   --   --   HEPARINUNFRC  --  <0.10* 0.31 0.19*  CREATININE 0.90  --  0.81  --     Estimated Creatinine Clearance: 77.6 mL/min (by C-G formula based on SCr of 0.81 mg/dL).   Medical History: Past Medical History:  Diagnosis Date   Diabetes mellitus without complication (HCC)    Fatty liver    Hyperlipidemia    Protein C deficiency (HCC)    Protein S deficiency (HCC)     Medications:  Medications Prior to Admission  Medication Sig Dispense Refill Last Dose/Taking   dabigatran (PRADAXA) 150 MG CAPS capsule Take 1 capsule (150 mg total) by mouth every 12 (twelve) hours. 60 capsule 0 03/09/2024 at  7:00 AM   ergocalciferol (VITAMIN D2) 1.25 MG (50000 UT) capsule Take 50,000 Units by mouth once a week. Mondays   03/03/2024   ezetimibe (ZETIA) 10 MG tablet Take 10 mg by mouth daily.   03/09/2024   folic acid (FOLVITE) 400 MCG tablet Take 0.5 tablets by mouth daily.   03/09/2024   insulin degludec (TRESIBA FLEXTOUCH) 100 UNIT/ML FlexTouch Pen Inject 26 Units into the skin daily.   03/09/2024 Morning   insulin lispro (HUMALOG KWIKPEN) 100 UNIT/ML KwikPen Inject into the skin as directed. per sliding scale Subcutaneous three times per day for 30 days   Past Week   levothyroxine (SYNTHROID) 50 MCG tablet Take 50 mcg by mouth  daily.   03/09/2024   Potassium (POTASSIMIN PO) Take 1 tablet by mouth every evening.   03/08/2024   Continuous Glucose Sensor (FREESTYLE LIBRE 3 PLUS SENSOR) MISC change every 15 days for 30 days      desvenlafaxine (PRISTIQ) 25 MG 24 hr tablet Take 25 mg by mouth daily. (Patient not taking: Reported on 03/09/2024)   Not Taking   Scheduled:   [START ON 03/11/2024] ezetimibe  10 mg Oral Daily   insulin aspart  0-9 Units Subcutaneous Q4H   levothyroxine  50 mcg Oral Daily   Infusions:   heparin 1,250 Units/hr (03/10/24 0726)   PRN: HYDROmorphone (DILAUDID) injection, oxyCODONE-acetaminophen  Assessment: 77 yof with a history of protein S and C deficiency, DVT, PE, IVC thrombosis . Patient is presenting with worsening pain to the L thigh with color changes to the thigh. Heparin per pharmacy consult placed for   DVT despite Pradaxa therapy .  Recently patient placed on Pradaxa after failing enoxaparin therapy and being on argatroban and arixtra inpatient. Oncology and vascular following both would like heparin for now. Last Pradaxa dose 3/16AM ~0700.  Heparin level subtherapeutic s/p rate change to 1250 units/hr,  no infusion issues per RN  Goal of Therapy:  Heparin level 0.3-0.7 units/ml Monitor platelets by anticoagulation protocol: Yes   Plan:  Heparin 2000 units IV x 1 and rate increase to 1400 units/hr F/u 6 hour heparin level F/u long term AC plan Daily heparin level, CBC, s/s bleeding  Daylene Posey, PharmD, [facility] Clinical Pharmacist ED Pharmacist Phone # [phone] 03/10/2024 11:48 AM

## 2024-03-10 NOTE — Plan of Care (Signed)

## 2024-03-10 NOTE — ED Notes (Addendum)
 Pt otf with transport, in no new onset distress at this time

## 2024-03-10 NOTE — Progress Notes (Signed)
 PROGRESS NOTE  Sierah Lacewell VWU:[id number] DOB: December 28, 1970 DOA: 03/09/2024 PCP: Joaquin Music, NP   LOS: 0 days   Brief narrative:   Shelby Conner is a 53 y.o. female with past medical history of hyperlipidemia, protein S and C deficiency, fatty liver, diabetes mellitus and history of history of DVT and pulmonary embolism first diagnosed in 2006 when patient was pregnant on warfarin for a long time and had switched to low molecular weight heparin and also was briefly on Xarelto, was admitted in December 2024 for extensive DVT. At the time patient was was placed on Pradaxa presented to the hospital with worsening swelling and pain on her lower extremities.  Had an outpatient Doppler ultrasound which showed DVT of the left lower extremity.  Patient stated that she was compliant with Pradaxa and had not missed any doses.  Denied any chest pain or shortness of breath.  Of note patient was recently admitted in February 2024 for extensive DVT of the lower extremities.  At that time vascular surgery was consulted and an patient underwent pharmacomechanical thrombectomy with filter removal and IVC stent placement.  Patient does have a history of liver cirrhosis secondary to fatty liver and alcohol use and follows up with [facility] but stopped following once no longer a candidate for transplant.  In the ED CT venogram showed IVC stent with nonocclusive thrombus and occlusive thrombus in the left external iliac vein, common femoral vein and femoral vein.  Dr. Lenell Antu vascular surgeon was consulted and plan is thrombectomy.  Patient has been started on heparin infusion as per oncology.       Assessment/Plan: Principal Problem:   DVT (deep venous thrombosis) (HCC) Active Problems:   Decompensated hepatic cirrhosis (HCC)   Type 2 diabetes mellitus without complication (HCC)   Hyponatremia   Primary hypercoagulable state (HCC)   Macrocytosis, subclinical hypothyroidism    Thrombocytopenia (HCC)   Occlusive thrombus  DVT of the left lower extremity with prior history of extensive DVT and PE with protein C and S deficiency.  Was on Pradaxa prior to this admission.  At this time oncology and vascular surgery on board and currently on heparin drip.  Plan for venous thrombectomy as per vascular surgery.  Diabetes mellitus type 2 with hyperglycemia  Continue sliding scale insulin every 4 hourly.  Currently NPO.  Will need to restart [place] when p.o. okay.  Latest hemoglobin A1c of 8 on 12/20/2021.    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  Compensated at this time.  Hypothyroidism continue Synthroid.  Macrocytosis secondary to vitamin B12.  Continue vitamin B12 supplements.  Thrombocytopenia secondary to cirrhosis of liver.  Latest platelet count of 93.  HIT panel was negative during last admission in December 2024. DVT prophylaxis:    Disposition: Likely home in 2 to 3 days  Status is: Observation The patient will require care spanning > 2 midnights and should be moved to inpatient because: Heparin drip, need for vascular intervention    Code Status:     Code Status: Full Code  Family Communication: none at bedside   Consultants: Vascular surgery Oncology  Procedures: none  Anti-infectives:  none  Anti-infectives (From admission, onward)    None        Subjective: Today, patient was seen and examined at bedside.  Complains of left leg severe pain and swelling.  Denies any dyspnea, chest pain or shortness of breath.  Objective: Vitals:   03/10/24 0727 03/10/24 0926  BP:  97/62  Pulse:  (!) 57  Resp:  17  Temp: 98.1 F (36.7 C) 98.2 F (36.8 C)  SpO2:  96%   No intake or output data in the 24 hours ending 03/10/24 1155 Filed Weights   03/09/24 1511  Weight: 74.4 kg   Body mass index is 30 kg/m.   Physical Exam:  GENERAL: Patient is alert awake and oriented. Not in obvious distress.  Obese HENT:  No scleral pallor or icterus. Pupils equally reactive to light. Oral mucosa is moist NECK: is supple, no gross swelling noted. CHEST: Clear to auscultation. No crackles or wheezes.  Diminished breath sounds bilaterally. CVS: S1 and S2 heard, no murmur. Regular rate and rhythm.  ABDOMEN: Soft, non-tender, bowel sounds are present. EXTREMITIES: Left leg with gross edema and tenderness on palpation up to the thigh. CNS: Cranial nerves are intact. No focal motor deficits. SKIN: warm and dry without rashes.  Data Review: I have personally reviewed the following laboratory data and studies,  CBC: Recent Labs  Lab 03/09/24 1527 03/10/24 0030  WBC 6.7 5.6  HGB 13.8 12.9  HCT 39.6 37.1  MCV 104.8* 103.9*  PLT 88* 93*   Basic Metabolic Panel: Recent Labs  Lab 03/09/24 1527 03/10/24 0030  NA 133* 136  K 3.6 3.3*  CL 96* 101  CO2 21* 26  GLUCOSE 368* 159*  BUN 12 9  CREATININE 0.90 0.81  CALCIUM 8.8* 8.7*   Liver Function Tests: Recent Labs  Lab 03/09/24 1527  AST 36  ALT 24  ALKPHOS 119  BILITOT 2.1*  PROT 7.1  ALBUMIN 3.1*   No results for input(s): "LIPASE", "AMYLASE" in the last 168 hours. No results for input(s): "AMMONIA" in the last 168 hours. Cardiac Enzymes: No results for input(s): "CKTOTAL", "CKMB", "CKMBINDEX", "TROPONINI" in the last 168 hours. BNP (last 3 results) No results for input(s): "BNP" in the last 8760 hours.  ProBNP (last 3 results) No results for input(s): "PROBNP" in the last 8760 hours.  CBG: Recent Labs  Lab 03/10/24 0038 03/10/24 0503 03/10/24 0730 03/10/24 0808  GLUCAP 181* 113* 127* 121*   No results found for this or any previous visit (from the past 240 hours).   Studies: CT VENOGRAM ABD/PELVIS/LOWER EXT BILAT Result Date: 03/09/2024 CLINICAL DATA:  DVT suspected. History of DVT. Do DVT 7 days ago now with worsening pain to left thigh and groin. EXAM: CT VENOGRAM ABDOMEN AND PELVIS AND LOWER EXTREMITY BILATERAL TECHNIQUE:  Venographic phase images of the abdomen, pelvis and lower extremities were obtained following the administration of intravenous contrast. Multiplanar reformats and maximum intensity projections were generated. RADIATION DOSE REDUCTION: This exam was performed according to the departmental dose-optimization program which includes automated exposure control, adjustment of the mA and/or kV according to patient size and/or use of iterative reconstruction technique. CONTRAST:  OMNIPAQUE IOHEXOL 350 MG/ML SOLN COMPARISON:  CT 02/03/2023 FINDINGS: Lower chest: No acute abnormality. Hepatobiliary: Heterogenous perfusion of the liver and nodular contour compatible with cirrhosis. No circumscribed hepatic lesion. Bilobed gallbladder. Cholelithiasis. No evidence of acute cholecystitis. No biliary dilation. Pancreas: Unremarkable. Spleen: Unremarkable. Adrenals/Urinary Tract: Normal adrenal glands. No urinary calculi or hydronephrosis. Unremarkable bladder. Stomach/Bowel: Normal caliber large and small bowel. No bowel wall thickening. Stomach and appendix are within normal limits. Vascular/Lymphatic: Aortic atherosclerotic calcification. Occlusive thrombus in the left common femoral artery prominent periesophageal varices. Recanalized periumbilical vein. Reproductive: Enhancing nodule in the left anterior uterine fundus is presumed to represent a fibroid. No adnexal mass. Other: No free intraperitoneal  fluid or air. Musculoskeletal: No acute fracture.  Swelling throughout the leg. IVC: IVC stent containing nonocclusive thrombus. There is moderate narrowing secondary to the thrombus. Portal and mesenteric veins: Patent. Bilateral iliac veins: Patent right iliac veins. Left common iliac vein stent is patent. Occlusive thrombus in the distal left external iliac vein extending through the common femoral vein and into the superior superficial femoral vein. Right lower extremity: No DVT in the right lower extremity. Left lower  extremity: Occlusive thrombus in the left common femoral artery and superior superficial femoral artery. Additional short segment occlusive thrombus in the distal superficial femoral artery (series 6/image 127). Patent popliteal vein. IMPRESSION: 1. IVC stent containing nonocclusive thrombus. There is moderate narrowing secondary to the thrombus. 2. Occlusive thrombus in the distal left external iliac vein extending through the common femoral vein and into the superior superficial femoral vein. Additional short segment occlusive thrombus in the distal left superficial femoral vein. 3. Cirrhosis with sequela of portal hypertension including esophageal varices. 4. Cholelithiasis. 5. Aortic Atherosclerosis (ICD10-I70.0). Critical Value/emergent results were called by telephone at the time of interpretation on 03/09/2024 at 7:41 pm to provider BROOKE SMALL , who verbally acknowledged these results. Electronically Signed   By: Minerva Fester M.D.   On: 03/09/2024 19:43      Joycelyn Das, MD  [facility] 03/10/2024  If 7PM-7AM, please contact night-coverage

## 2024-03-10 NOTE — Progress Notes (Signed)
 ANTICOAGULATION CONSULT NOTE  Pharmacy Consult for Heparin Indication: DVT despite Pradaxa therapy  Allergies  Allergen Reactions   Morphine Itching    Per pt     Patient Measurements: Height: 5\' 2"  (157.5 cm) Weight: 74.4 kg (164 lb 0.4 oz) IBW/kg (Calculated) : 50.1 Heparin Dosing Weight: 66.2 kg  Vital Signs: Temp: 98 F (36.7 C) (03/17 1231) Temp Source: Oral (03/17 1231) BP: 115/62 (03/17 1830) Pulse Rate: 69 (03/17 1830)  Labs: Recent Labs    03/09/24 1527 03/09/24 2100 03/10/24 0030 03/10/24 1028  HGB 13.8  --  12.9  --   HCT 39.6  --  37.1  --   PLT 88*  --  93*  --   APTT  --  42*  --   --   LABPROT 20.7*  --   --   --   INR 1.8*  --   --   --   HEPARINUNFRC  --  <0.10* 0.31 0.19*  CREATININE 0.90  --  0.81  --     Estimated Creatinine Clearance: 77.6 mL/min (by C-G formula based on SCr of 0.81 mg/dL).   Medical History: Past Medical History:  Diagnosis Date   Diabetes mellitus without complication (HCC)    Fatty liver    Hyperlipidemia    Protein C deficiency (HCC)    Protein S deficiency (HCC)     Medications:  Medications Prior to Admission  Medication Sig Dispense Refill Last Dose/Taking   dabigatran (PRADAXA) 150 MG CAPS capsule Take 1 capsule (150 mg total) by mouth every 12 (twelve) hours. 60 capsule 0 03/09/2024 at  7:00 AM   ergocalciferol (VITAMIN D2) 1.25 MG (50000 UT) capsule Take 50,000 Units by mouth once a week. Mondays   03/03/2024   ezetimibe (ZETIA) 10 MG tablet Take 10 mg by mouth daily.   03/09/2024   folic acid (FOLVITE) 400 MCG tablet Take 0.5 tablets by mouth daily.   03/09/2024   insulin degludec (TRESIBA FLEXTOUCH) 100 UNIT/ML FlexTouch Pen Inject 26 Units into the skin daily.   03/09/2024 Morning   insulin lispro (HUMALOG KWIKPEN) 100 UNIT/ML KwikPen Inject into the skin as directed. per sliding scale Subcutaneous three times per day for 30 days   Past Week   levothyroxine (SYNTHROID) 50 MCG tablet Take 50 mcg by mouth  daily.   03/09/2024   Potassium (POTASSIMIN PO) Take 1 tablet by mouth every evening.   03/08/2024   Continuous Glucose Sensor (FREESTYLE LIBRE 3 PLUS SENSOR) MISC change every 15 days for 30 days      desvenlafaxine (PRISTIQ) 25 MG 24 hr tablet Take 25 mg by mouth daily. (Patient not taking: Reported on 03/09/2024)   Not Taking   Scheduled:   [MAR Hold] ezetimibe  10 mg Oral Daily   [MAR Hold] insulin aspart  0-9 Units Subcutaneous Q4H   [MAR Hold] levothyroxine  50 mcg Oral Daily   Infusions:   sodium chloride     heparin 500 Units/hr (03/10/24 1803)   PRN: [NWG Hold] diphenhydrAMINE, fentaNYL, Heparin (Porcine) in NaCl, heparin sodium (porcine), [MAR Hold]  HYDROmorphone (DILAUDID) injection, iodixanol, lidocaine (PF), midazolam, [MAR Hold] oxyCODONE-acetaminophen  Assessment: 53 yof with a history of protein S and C deficiency, DVT, PE, IVC thrombosis . Patient is presenting with worsening pain to the L thigh with color changes to the thigh. Heparin per pharmacy consult placed for   DVT despite Pradaxa therapy .  Recently patient placed on Pradaxa after failing enoxaparin therapy and being on  argatroban and arixtra inpatient. Oncology and vascular following both would like heparin for now. Last Pradaxa dose 3/16AM ~0700.  3/17 PM: Patient now s/p peripheral vascular thrombectomy. Pharmacy re-consulted to resume heparin immediately post-op. Noted patient was previously subtherapeutic on heparin 1250 units/hr, and rate was increased to 1400 units/hr.   Goal of Therapy:  Heparin level 0.3-0.7 units/ml Monitor platelets by anticoagulation protocol: Yes   Plan:  Start heparin 1400 units/hr F/u 6 hour heparin level F/u long term AC plan Daily heparin level, CBC, s/s bleeding  Enos Fling, PharmD PGY-1 Acute Care Pharmacy Resident 03/10/2024 6:50 PM

## 2024-03-11 ENCOUNTER — Encounter (HOSPITAL_COMMUNITY): Payer: Self-pay | Admitting: Vascular Surgery

## 2024-03-11 DIAGNOSIS — I82422 Acute embolism and thrombosis of left iliac vein: Secondary | ICD-10-CM | POA: Diagnosis not present

## 2024-03-11 DIAGNOSIS — I82412 Acute embolism and thrombosis of left femoral vein: Secondary | ICD-10-CM | POA: Diagnosis not present

## 2024-03-11 DIAGNOSIS — I8290 Acute embolism and thrombosis of unspecified vein: Secondary | ICD-10-CM

## 2024-03-11 DIAGNOSIS — I82492 Acute embolism and thrombosis of other specified deep vein of left lower extremity: Secondary | ICD-10-CM | POA: Diagnosis not present

## 2024-03-11 LAB — BASIC METABOLIC PANEL
Anion gap: 10 (ref 5–15)
BUN: 9 mg/dL (ref 6–20)
CO2: 24 mmol/L (ref 22–32)
Calcium: 7.8 mg/dL — ABNORMAL LOW (ref 8.9–10.3)
Chloride: 100 mmol/L (ref 98–111)
Creatinine, Ser: 0.77 mg/dL (ref 0.44–1.00)
GFR, Estimated: >60 mL/min (ref >60)
Glucose, Bld: 107 mg/dL — ABNORMAL HIGH (ref 70–99)
Potassium: 3.6 mmol/L (ref 3.5–5.1)
Sodium: 134 mmol/L — ABNORMAL LOW (ref 135–145)

## 2024-03-11 LAB — GLUCOSE, CAPILLARY
Glucose-Capillary: 110 mg/dL — ABNORMAL HIGH (ref 70–99)
Glucose-Capillary: 118 mg/dL — ABNORMAL HIGH (ref 70–99)
Glucose-Capillary: 134 mg/dL — ABNORMAL HIGH (ref 70–99)
Glucose-Capillary: 138 mg/dL — ABNORMAL HIGH (ref 70–99)
Glucose-Capillary: 145 mg/dL — ABNORMAL HIGH (ref 70–99)
Glucose-Capillary: 150 mg/dL — ABNORMAL HIGH (ref 70–99)

## 2024-03-11 LAB — CBC
HCT: 31.5 % — ABNORMAL LOW (ref 36.0–46.0)
Hemoglobin: 11.2 g/dL — ABNORMAL LOW (ref 12.0–15.0)
MCH: 36.7 pg — ABNORMAL HIGH (ref 26.0–34.0)
MCHC: 35.6 g/dL (ref 30.0–36.0)
MCV: 103.3 fL — ABNORMAL HIGH (ref 80.0–100.0)
Platelets: 82 10*3/uL — ABNORMAL LOW (ref 150–400)
RBC: 3.05 MIL/uL — ABNORMAL LOW (ref 3.87–5.11)
RDW: 14 % (ref 11.5–15.5)
WBC: 5.5 10*3/uL (ref 4.0–10.5)
nRBC: 0.4 % — ABNORMAL HIGH (ref 0.0–0.2)

## 2024-03-11 LAB — LIPID PANEL
Cholesterol: 171 mg/dL (ref 0–200)
HDL: 25 mg/dL — ABNORMAL LOW (ref >40)
LDL Cholesterol: 112 mg/dL — ABNORMAL HIGH (ref 0–99)
Total CHOL/HDL Ratio: 6.8 ratio
Triglycerides: 169 mg/dL — ABNORMAL HIGH (ref <150)
VLDL: 34 mg/dL (ref 0–40)

## 2024-03-11 LAB — HEPARIN LEVEL (UNFRACTIONATED)
Heparin Unfractionated: 0.18 [IU]/mL — ABNORMAL LOW (ref 0.30–0.70)
Heparin Unfractionated: 0.28 [IU]/mL — ABNORMAL LOW (ref 0.30–0.70)

## 2024-03-11 LAB — MAGNESIUM: Magnesium: 1.6 mg/dL — ABNORMAL LOW (ref 1.7–2.4)

## 2024-03-11 MED ORDER — INSULIN GLARGINE 100 UNIT/ML ~~LOC~~ SOLN
26.0000 [IU] | Freq: Every day | SUBCUTANEOUS | Status: DC
  Administered 2024-03-11 – 2024-03-14 (×4): 26 [IU] via SUBCUTANEOUS
  Filled 2024-03-11 (×4): qty 0.26

## 2024-03-11 MED ORDER — MAGNESIUM OXIDE -MG SUPPLEMENT 400 (240 MG) MG PO TABS
400.0000 mg | ORAL_TABLET | Freq: Two times a day (BID) | ORAL | Status: DC
  Administered 2024-03-11 – 2024-03-14 (×7): 400 mg via ORAL
  Filled 2024-03-11 (×7): qty 1

## 2024-03-11 MED ORDER — MAGNESIUM SULFATE 2 GM/50ML IV SOLN
2.0000 g | Freq: Once | INTRAVENOUS | Status: AC
  Administered 2024-03-11: 2 g via INTRAVENOUS
  Filled 2024-03-11: qty 50

## 2024-03-11 MED ORDER — POTASSIUM CHLORIDE CRYS ER 20 MEQ PO TBCR
40.0000 meq | EXTENDED_RELEASE_TABLET | Freq: Once | ORAL | Status: AC
  Administered 2024-03-11: 40 meq via ORAL
  Filled 2024-03-11: qty 2

## 2024-03-11 MED ORDER — INSULIN ASPART 100 UNIT/ML IJ SOLN
0.0000 [IU] | Freq: Three times a day (TID) | INTRAMUSCULAR | Status: DC
  Administered 2024-03-11 (×2): 1 [IU] via SUBCUTANEOUS
  Administered 2024-03-12: 2 [IU] via SUBCUTANEOUS
  Administered 2024-03-13: 1 [IU] via SUBCUTANEOUS
  Administered 2024-03-13: 2 [IU] via SUBCUTANEOUS

## 2024-03-11 NOTE — Progress Notes (Signed)
 PROGRESS NOTE  Shelby Conner ZOX:[id number] DOB: [birth year] DOA: 03/09/2024 PCP: Joaquin Music, NP   LOS: 1 day   Brief narrative:   Shelby Conner is a 53 y.o. female with past medical history of hyperlipidemia, protein S and C deficiency, fatty liver, diabetes mellitus and history of history of DVT and pulmonary embolism first diagnosed in 2006 when patient was pregnant on warfarin for a long time and had switched to low molecular weight heparin and also was briefly on Xarelto, was admitted in December 2024 for extensive DVT. At the time patient was was placed on Pradaxa presented to the hospital with worsening swelling and pain on her lower extremities.  Had an outpatient Doppler ultrasound which showed DVT of the left lower extremity.  Patient stated that she was compliant with Pradaxa and had not missed any doses.  Denied any chest pain or shortness of breath.  Of note, patient was recently admitted in February 2024 for extensive DVT of the lower extremities.  At that time vascular surgery was consulted and an patient underwent pharmacomechanical thrombectomy with filter removal and IVC stent placement.  Patient does have a history of liver cirrhosis secondary to fatty liver and alcohol use and follows up with [facility] but stopped following once no longer a candidate for transplant.  In the ED CT venogram showed IVC stent with nonocclusive thrombus and occlusive thrombus in the left external iliac vein, common femoral vein and femoral vein.  Dr. Lenell Antu vascular surgeon was consulted and patient was admitted hospital for thrombectomy.  Was started on heparin infusion.    Assessment/Plan: Principal Problem:   DVT (deep venous thrombosis) (HCC) Active Problems:   Decompensated hepatic cirrhosis (HCC)   Type 2 diabetes mellitus without complication (HCC)   Hyponatremia   Primary hypercoagulable state (HCC)   Macrocytosis, subclinical hypothyroidism   Thrombocytopenia  (HCC)   Occlusive thrombus  DVT of the left lower extremity with prior history of extensive DVT and PE with protein C and S deficiency.  Was on Pradaxa prior to this admission. Oncology and vascular surgery on board and underwent percutaneous mechanical thrombectomy on 03/10/2024.  Currently on heparin drip as per oncology.  Diabetes mellitus type 2 with hyperglycemia  Continue sliding scale insulin, restart [place]. Latest hemoglobin A1c of 8 on 12/20/2021.    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  Compensated at this time.  Hypothyroidism continue Synthroid.  Mild hypomagnesia.  Magnesium of 1.6 today.  Will replace.  Macrocytosis secondary to vitamin B12.  Continue vitamin B12 supplements.  Thrombocytopenia secondary to cirrhosis of liver.  Latest platelet count of 82.  HIT panel was negative during last admission in December 2024.  Oncology on board and okay for heparin drip.  DVT prophylaxis: Heparin drip   Disposition: Likely home in 1 to 2 days when okay with oncology and vascular surgery.  Status is: Inpatient The patient is inpatient because: Heparin drip, status post vascular intervention with thrombectomy, pending clinical improvement, ongoing lower extremity pain.    Code Status:     Code Status: Full Code  Family Communication: none at bedside   Consultants: Vascular surgery Oncology  Procedures: percutaneous mechanical thrombectomy on 03/10/2024 of the left lower extremity by vascular surgery  Anti-infectives:  none  Anti-infectives (From admission, onward)    None      Subjective: Today, patient was seen and examined at bedside.  Patient still complains of left lower extremity pain.  Denies any chest pain, shortness of breath  or dyspnea.    Objective: Vitals:   03/11/24 0457 03/11/24 0812  BP: 107/64 104/66  Pulse:  63  Resp: 17 17  Temp: 100.1 F (37.8 C) 98.8 F (37.1 C)  SpO2:  96%    Intake/Output Summary (Last  24 hours) at 03/11/2024 1115 Last data filed at 03/11/2024 0813 Gross per 24 hour  Intake 1485.37 ml  Output --  Net 1485.37 ml   Filed Weights   03/09/24 1511  Weight: 74.4 kg   Body mass index is 30 kg/m.   Physical Exam:  GENERAL: Patient is alert awake and oriented. Not in obvious distress.  Obese HENT: No scleral pallor or icterus. Pupils equally reactive to light. Oral mucosa is moist NECK: is supple, no gross swelling noted. CHEST: Clear to auscultation. No crackles or wheezes.  Diminished breath sounds bilaterally. CVS: S1 and S2 heard, no murmur. Regular rate and rhythm.  ABDOMEN: Soft, non-tender, bowel sounds are present. EXTREMITIES: Left leg with gross edema and tenderness on palpation up to the thigh with popliteal area dressing from recent vascular intervention. CNS: Cranial nerves are intact. No focal motor deficits. SKIN: warm and dry without rashes.  Data Review: I have personally reviewed the following laboratory data and studies,  CBC: Recent Labs  Lab 03/09/24 1527 03/10/24 0030 03/11/24 0546  WBC 6.7 5.6 5.5  HGB 13.8 12.9 11.2*  HCT 39.6 37.1 31.5*  MCV 104.8* 103.9* 103.3*  PLT 88* 93* 82*   Basic Metabolic Panel: Recent Labs  Lab 03/09/24 1527 03/10/24 0030 03/11/24 0546  NA 133* 136 134*  K 3.6 3.3* 3.6  CL 96* 101 100  CO2 21* 26 24  GLUCOSE 368* 159* 107*  BUN 12 9 9   CREATININE 0.90 0.81 0.77  CALCIUM 8.8* 8.7* 7.8*  MG  --   --  1.6*   Liver Function Tests: Recent Labs  Lab 03/09/24 1527  AST 36  ALT 24  ALKPHOS 119  BILITOT 2.1*  PROT 7.1  ALBUMIN 3.1*   No results for input(s): "LIPASE", "AMYLASE" in the last 168 hours. No results for input(s): "AMMONIA" in the last 168 hours. Cardiac Enzymes: No results for input(s): "CKTOTAL", "CKMB", "CKMBINDEX", "TROPONINI" in the last 168 hours. BNP (last 3 results) No results for input(s): "BNP" in the last 8760 hours.  ProBNP (last 3 results) No results for input(s):  "PROBNP" in the last 8760 hours.  CBG: Recent Labs  Lab 03/10/24 1841 03/10/24 2037 03/11/24 0004 03/11/24 0457 03/11/24 0810  GLUCAP 85 104* 145* 118* 138*   No results found for this or any previous visit (from the past 240 hours).   Studies: PERIPHERAL VASCULAR CATHETERIZATION Result Date: 03/10/2024 Images from the original result were not included. Patient name: Shelby Conner MRN: [id number] DOB: Aug 31, 1971 Sex: female 03/10/2024 Pre-operative Diagnosis: Extensive left lower extremity DVT with thrombus in the IVC stent and left common iliac vein stent Post-operative diagnosis:  Same Surgeon:  Apolinar Junes C. Randie Heinz, MD Procedure Performed: 1.  Percutaneous ultrasound-guided cannulation left popliteal vein 2.  Left lower extremity and central venogram 3.  Intravascular ultrasound left popliteal, left femoral, left common femoral, left external and common iliac veins and IVC 4.  Percutaneous mechanical thrombectomy using Inari Clottriever XL and bold catheters of IVC, left common and external iliac vein including left common iliac vein stent, left common femoral vein and femoral vein 5.  Balloon venoplasty of left common and external iliac veins and common femoral vein with 10 mm balloon and venoplasty of  left femoral vein and popliteal vein with 7 mm balloon 6.  Moderate sedation with fentanyl Versed for 92 minutes Indications: 53 year old female with a remote history of IVC filter and left common iliac vein stent placement.  1 year ago she underwent removal of the filter with stenting of the IVC for right lower extremity edema.  She now presents with extensive left lower extremity DVT and edema with thrombus in the IVC stent as well as occlusion of the common iliac vein stent in the common femoral vein by CT venogram.  She is now indicated for venography with intravascular ultrasound and possible thrombectomy with possible repeat stenting. Findings: The popliteal vein was difficult to access.   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  The IVC stent had initially approximately 75% stenosis this was reduced to less than 30% residual stenosis after mechanical thrombectomy there was no ballooning performed of the IVC stent.  The left common iliac vein stent was also subtotally occluded and after thrombectomy and balloon venoplasty this was reduced to 0% residual stenosis and there was brisk flow through the stent.  Mechanical thrombectomy also removed what appeared to be approximately a 4 cm balloon that was not known to be present within the stents over the veins and was likely from a previous procedure.  There was chronic and subacute thrombus of the common femoral vein which was removed with thrombectomy and balloon venoplasty reduced this to less than 20% residual stenosis and there was a brisk flow channel through there without any reflux into the profunda vein and the femoral vein which was initially occluded and ballooned with 7 mm balloon then demonstrated a brisk flow channel as well without reflux into collateral veins.  Procedure:  The patient was identified in the holding area and taken to room 8.  The patient was then placed prone on the table and prepped and draped in the usual sterile fashion.  A time out was called.  Ultrasound was used to evaluate initially the left small saphenous vein but after multiple unsuccessful attempts to get a wire to pass I then cannulated the popliteal vein but again did not have backbleeding but was able to get a wire to pass under fluoroscopic guidance.  At this time 7000 units of heparin was administered.  I then placed a micropuncture sheath followed by a Glidewire advantage.  I placed an 8 French sheath and performed venography which demonstrated occlusion of the femoral vein.  I was able to cross this occlusion in the mid leg with Glidewire advantage  and then performed balloon venoplasty with 7 mm balloon.  I then placed the Inari 16 French sheath.  This was also ballooned open at the base with a 7 mm balloon.  We then were able to get the Glidewire advantage up to the IVC and perform venography of the IVC and then a Rosen wire initially followed by an Amplatz wire was placed into the right subclavian vein.  We performed intravascular ultrasound including the IVC, left common and external iliac veins left, left common femoral and femoral veins.  We then began with the clot Treiver XL which was used within the IVC stent and was removed through the common iliac vein stent and the femoral vein not fully deployed.  This was used for 3 separate passes until there was a clear pass and the clot  retrieved all appeared chronic and subacute.  We then used the clot Treiver bold in the left common iliac vein stent and left common femoral vein.  We then performed balloon venoplasty of the left common iliac vein stent and left common femoral vein with 10 mm balloon.  Completion demonstrated brisk flow channel through the femoral vein into the common femoral vein on the left and into the left common and external neck veins and IVC.  IVUS demonstrated much improved flow channel through the IVC stent with no residual stenosis in the left common iliac vein stent and a a large flow channel through the common femoral vein and a channel in the femoral vein as well.  Satisfied with this the wire was removed and the cannulation site was suture-ligated with 4-0 Monocryl suture.  The patient tolerated the procedure without any complication. Contrast: 60 cc Brandon C. Randie Heinz, MD [facility] [place] Office: [phone] Pager: [phone]   CT VENOGRAM ABD/PELVIS/LOWER EXT BILAT Result Date: 03/09/2024 CLINICAL DATA:  DVT suspected. History of DVT. Do DVT 7 days ago now with worsening pain to left thigh and groin. EXAM: CT VENOGRAM ABDOMEN AND PELVIS AND LOWER  EXTREMITY BILATERAL TECHNIQUE: Venographic phase images of the abdomen, pelvis and lower extremities were obtained following the administration of intravenous contrast. Multiplanar reformats and maximum intensity projections were generated. RADIATION DOSE REDUCTION: This exam was performed according to the departmental dose-optimization program which includes automated exposure control, adjustment of the mA and/or kV according to patient size and/or use of iterative reconstruction technique. CONTRAST:  OMNIPAQUE IOHEXOL 350 MG/ML SOLN COMPARISON:  CT 02/03/2023 FINDINGS: Lower chest: No acute abnormality. Hepatobiliary: Heterogenous perfusion of the liver and nodular contour compatible with cirrhosis. No circumscribed hepatic lesion. Bilobed gallbladder. Cholelithiasis. No evidence of acute cholecystitis. No biliary dilation. Pancreas: Unremarkable. Spleen: Unremarkable. Adrenals/Urinary Tract: Normal adrenal glands. No urinary calculi or hydronephrosis. Unremarkable bladder. Stomach/Bowel: Normal caliber large and small bowel. No bowel wall thickening. Stomach and appendix are within normal limits. Vascular/Lymphatic: Aortic atherosclerotic calcification. Occlusive thrombus in the left common femoral artery prominent periesophageal varices. Recanalized periumbilical vein. Reproductive: Enhancing nodule in the left anterior uterine fundus is presumed to represent a fibroid. No adnexal mass. Other: No free intraperitoneal fluid or air. Musculoskeletal: No acute fracture.  Swelling throughout the leg. IVC: IVC stent containing nonocclusive thrombus. There is moderate narrowing secondary to the thrombus. Portal and mesenteric veins: Patent. Bilateral iliac veins: Patent right iliac veins. Left common iliac vein stent is patent. Occlusive thrombus in the distal left external iliac vein extending through the common femoral vein and into the superior superficial femoral vein. Right lower extremity: No DVT in the  right lower extremity. Left lower extremity: Occlusive thrombus in the left common femoral artery and superior superficial femoral artery. Additional short segment occlusive thrombus in the distal superficial femoral artery (series 6/image 127). Patent popliteal vein. IMPRESSION: 1. IVC stent containing nonocclusive thrombus. There is moderate narrowing secondary to the thrombus. 2. Occlusive thrombus in the distal left external iliac vein extending through the common femoral vein and into the superior superficial femoral vein. Additional short segment occlusive thrombus in the distal left superficial femoral vein. 3. Cirrhosis with sequela of portal hypertension including esophageal varices. 4. Cholelithiasis. 5. Aortic Atherosclerosis (ICD10-I70.0). Critical Value/emergent results were called by telephone at the time of interpretation on 03/09/2024 at 7:41 pm to provider BROOKE SMALL , who verbally acknowledged these results. Electronically Signed   By: Angelique Holm.D.  On: 03/09/2024 19:43      Joycelyn Das, MD  [facility] 03/11/2024  If 7PM-7AM, please contact night-coverage

## 2024-03-11 NOTE — Progress Notes (Signed)
 Ms. Trinna Balloon was seen by vascular surgery and had a thrombectomy yesterday.  She has a wrapping on her left leg.  She had the IVC dilated at.  She continues heparin.  I would keep her on heparin for right now.  She has had no cough or shortness of breath.  Is been no chest wall pain..  She has had no bleeding.  There is no fever.  She has had no rashes.  I wonder if this really was a failure of the Pradaxa.  It sounds like this is more of a anatomical issue with the vein.  Again I am not sure what else we can do about that.  Again, I will go ahead and keep her on the heparin.  I am unsure how long she has to have this wrap on her left leg.  I know that she we will get great care from everybody upon 6 E.   Christin Bach, MD  John 1:1

## 2024-03-11 NOTE — Progress Notes (Addendum)
  Progress Note    03/11/2024 8:32 AM 1 Day Post-Op  Subjective:  L leg wrap feels tight   Vitals:   03/11/24 0457 03/11/24 0812  BP: 107/64 104/66  Pulse:  63  Resp: 17 17  Temp: 100.1 F (37.8 C) 98.8 F (37.1 C)  SpO2:  96%   Physical Exam: Lungs:  non labored Incisions:  L popliteal space without firm hematoma Extremities:  palpable L DP pulse Neurologic: A&O  CBC    Component Value Date/Time   WBC 5.6 03/10/2024 0030   RBC 3.57 (L) 03/10/2024 0030   HGB 12.9 03/10/2024 0030   HGB 14.6 03/03/2024 0847   HCT 37.1 03/10/2024 0030   PLT 93 (L) 03/10/2024 0030   PLT 98 (L) 03/03/2024 0847   MCV 103.9 (H) 03/10/2024 0030   MCH 36.1 (H) 03/10/2024 0030   MCHC 34.8 03/10/2024 0030   RDW 14.0 03/10/2024 0030   LYMPHSABS 1.3 03/03/2024 0847   MONOABS 0.4 03/03/2024 0847   EOSABS 0.1 03/03/2024 0847   BASOSABS 0.1 03/03/2024 0847    BMET    Component Value Date/Time   NA 136 03/10/2024 0030   K 3.3 (L) 03/10/2024 0030   CL 101 03/10/2024 0030   CO2 26 03/10/2024 0030   GLUCOSE 159 (H) 03/10/2024 0030   BUN 9 03/10/2024 0030   CREATININE 0.81 03/10/2024 0030   CREATININE 0.70 03/03/2024 0847   CALCIUM 8.7 (L) 03/10/2024 0030   GFRNONAA >60 03/10/2024 0030   GFRNONAA >60 03/03/2024 0847   GFRAA  10/22/2009 0450    >60        The eGFR has been calculated using the MDRD equation. This calculation has not been validated in all clinical situations. eGFR's persistently <60 mL/min signify possible Chronic Kidney Disease.    INR    Component Value Date/Time   INR 1.8 (H) 03/09/2024 1527     Intake/Output Summary (Last 24 hours) at 03/11/2024 4010 Last data filed at 03/11/2024 0813 Gross per 24 hour  Intake 1485.37 ml  Output --  Net 1485.37 ml     Assessment/Plan:  53 y.o. female is s/p IVC and L iliac venous thrombectomy 1 Day Post-Op   LLE well perfused with palpable DP pulse Ok to remove ACE wrap and place thigh high compression Ok for  discharge when transitioned from heparin to p.o. anticoagulation per Dr. Myna Hidalgo Our office will arrange IVC, iliac duplex in 4-6 weeks   Emilie Rutter, PA-C [facility] [phone] 03/11/2024 8:32 AM  I have independently interviewed and examined patient and agree with PA assessment and plan above.  Left lower extremity remains somewhat edematous although color and edema have significantly improved.  Dr. Myna Hidalgo following diligently for anticoagulation regimen.  She can see [place] in 4 to 6 weeks with IVC iliac duplex.  Dressing was removed and I have recommended wearing her compression stockings when she is out of bed.  Jadzia Ibsen C. Randie Heinz, MD [facility] [place] Office: [phone] Pager: [phone]

## 2024-03-11 NOTE — Progress Notes (Signed)
 ANTICOAGULATION CONSULT NOTE  Pharmacy Consult for Heparin Indication: DVT despite Pradaxa therapy  Allergies  Allergen Reactions   Morphine Itching    Per pt     Patient Measurements: Height: 5\' 2"  (157.5 cm) Weight: 74.4 kg (164 lb 0.4 oz) IBW/kg (Calculated) : 50.1 Heparin Dosing Weight: 66.2 kg  Vital Signs: Temp: 98.6 F (37 C) (03/18 1622) Temp Source: Oral (03/18 1622) BP: 122/78 (03/18 1622) Pulse Rate: 62 (03/18 1622)  Labs: Recent Labs    03/09/24 1527 03/09/24 2100 03/09/24 2100 03/10/24 0030 03/10/24 1028 03/11/24 0546 03/11/24 0646 03/11/24 1638  HGB 13.8  --   --  12.9  --  11.2*  --   --   HCT 39.6  --   --  37.1  --  31.5*  --   --   PLT 88*  --   --  93*  --  82*  --   --   APTT  --  42*  --   --   --   --   --   --   LABPROT 20.7*  --   --   --   --   --   --   --   INR 1.8*  --   --   --   --   --   --   --   HEPARINUNFRC  --  <0.10*   < > 0.31 0.19*  --  0.18* 0.28*  CREATININE 0.90  --   --  0.81  --  0.77  --   --    < > = values in this interval not displayed.    Estimated Creatinine Clearance: 78.5 mL/min (by C-G formula based on SCr of 0.77 mg/dL).   Medical History: Past Medical History:  Diagnosis Date   Diabetes mellitus without complication (HCC)    Fatty liver    Hyperlipidemia    Protein C deficiency (HCC)    Protein S deficiency (HCC)     Medications:  Medications Prior to Admission  Medication Sig Dispense Refill Last Dose/Taking   dabigatran (PRADAXA) 150 MG CAPS capsule Take 1 capsule (150 mg total) by mouth every 12 (twelve) hours. 60 capsule 0 03/09/2024 at  7:00 AM   ergocalciferol (VITAMIN D2) 1.25 MG (50000 UT) capsule Take 50,000 Units by mouth once a week. Mondays   03/03/2024   ezetimibe (ZETIA) 10 MG tablet Take 10 mg by mouth daily.   03/09/2024   folic acid (FOLVITE) 400 MCG tablet Take 0.5 tablets by mouth daily.   03/09/2024   insulin degludec (TRESIBA FLEXTOUCH) 100 UNIT/ML FlexTouch Pen Inject 26 Units  into the skin daily.   03/09/2024 Morning   insulin lispro (HUMALOG KWIKPEN) 100 UNIT/ML KwikPen Inject into the skin as directed. per sliding scale Subcutaneous three times per day for 30 days   Past Week   levothyroxine (SYNTHROID) 50 MCG tablet Take 50 mcg by mouth daily.   03/09/2024   Potassium (POTASSIMIN PO) Take 1 tablet by mouth every evening.   03/08/2024   Continuous Glucose Sensor (FREESTYLE LIBRE 3 PLUS SENSOR) MISC change every 15 days for 30 days      desvenlafaxine (PRISTIQ) 25 MG 24 hr tablet Take 25 mg by mouth daily. (Patient not taking: Reported on 03/09/2024)   Not Taking   Scheduled:   ezetimibe  10 mg Oral Daily   insulin aspart  0-9 Units Subcutaneous TID WC   insulin glargine  26 Units Subcutaneous Daily   levothyroxine  50 mcg Oral Daily   magnesium oxide  400 mg Oral BID   sodium chloride flush  3 mL Intravenous Q12H   Infusions:   sodium chloride     heparin 1,600 Units/hr (03/11/24 1552)   PRN: sodium chloride, acetaminophen, diphenhydrAMINE, hydrALAZINE, HYDROmorphone (DILAUDID) injection, labetalol, ondansetron (ZOFRAN) IV, oxyCODONE-acetaminophen, sodium chloride flush  Assessment: 53 yof with a history of protein S and C deficiency, DVT, PE, IVC thrombosis . Patient is presenting with worsening pain to the L thigh with color changes to the thigh. Heparin per pharmacy consult placed for   DVT despite Pradaxa therapy .  Recently patient placed on Pradaxa after failing enoxaparin therapy and being on argatroban and arixtra inpatient. Oncology and vascular following both would like heparin for now. Last Pradaxa dose 3/16AM ~0700. -s/p thrombectomy on 3/17  -heparin level= 0.18 on 1400 units/hr -hg= 11.2 (trend down, plt= 82 (history of low plt)  3/18 PM update: Heparin level 0.28, subtherapeutic on 1600 units/hr.  No infusion issues, minor oozing from IV site per RN.  Goal of Therapy:  Heparin level 0.3-0.7 units/ml Monitor platelets by anticoagulation  protocol: Yes   Plan:  -Increase heparin to 1750 units/hr -Heparin level in 6 hours and daily wth CBC daily  Trixie Rude, PharmD Clinical Pharmacist 03/11/2024  5:10 PM

## 2024-03-11 NOTE — Discharge Instructions (Signed)
   [facility]  Discharge Instructions  Lower Extremity Angiogram; Angioplasty/Stenting  Please refer to the following instructions for your post-procedure care. Your surgeon or physician assistant will discuss any changes with you.  Activity  Avoid lifting more than 8 pounds (1 gallons of milk) for 72 hours (3 days) after your procedure. You may walk as much as you can tolerate. It's OK to drive after 72 hours.  Bathing/Showering  You may shower the day after your procedure. If you have a bandage, you may remove it at 24- 48 hours. Clean your incision site with mild soap and water. Pat the area dry with a clean towel.  Diet  Resume your pre-procedure diet. There are no special food restrictions following this procedure. All patients with peripheral vascular disease should follow a low fat/low cholesterol diet. In order to heal from your surgery, it is CRITICAL to get adequate nutrition. Your body requires vitamins, minerals, and protein. Vegetables are the best source of vitamins and minerals. Vegetables also provide the perfect balance of protein. Processed food has little nutritional value, so try to avoid this.  Medications  Resume taking all of your medications unless your doctor tells you not to. If your incision is causing pain, you may take over-the-counter pain relievers such as acetaminophen (Tylenol)  Follow Up  Follow up will be arranged at the time of your procedure. You may have an office visit scheduled or may be scheduled for surgery. Ask your surgeon if you have any questions.  Please call us immediately for any of the following conditions: Severe or worsening pain your legs or feet at rest or with walking. Increased pain, redness, drainage at your groin puncture site. Fever of 101 degrees or higher. If you have any mild or slow bleeding from your puncture site: lie down, apply firm constant pressure over the area with a piece of  gauze or a clean wash cloth for 30 minutes- no peeking!, call 911 right away if you are still bleeding after 30 minutes, or if the bleeding is heavy and unmanageable.  Reduce your risk factors of vascular disease:  Stop smoking. If you would like help call [facility] at 1-800-QUIT-NOW (254-819-8059) or [place] at (250)353-2060. Manage your cholesterol Maintain a desired weight Control your diabetes Keep your blood pressure down  If you have any questions, please call the office at [phone]

## 2024-03-11 NOTE — Plan of Care (Signed)
  Problem: Health Behavior/Discharge Planning: Goal: Ability to manage health-related needs will improve Outcome: Progressing   Problem: Clinical Measurements: Goal: Will remain free from infection Outcome: Progressing Goal: Respiratory complications will improve Outcome: Progressing Goal: Cardiovascular complication will be avoided Outcome: Progressing   Problem: Activity: Goal: Risk for activity intolerance will decrease Outcome: Progressing   Problem: Nutrition: Goal: Adequate nutrition will be maintained Outcome: Progressing   Problem: Pain Managment: Goal: General experience of comfort will improve and/or be controlled Outcome: Progressing   Problem: Safety: Goal: Ability to remain free from injury will improve Outcome: Progressing   Problem: Metabolic: Goal: Ability to maintain appropriate glucose levels will improve Outcome: Progressing   Problem: Cardiovascular: Goal: Ability to achieve and maintain adequate cardiovascular perfusion will improve Outcome: Progressing

## 2024-03-11 NOTE — Progress Notes (Signed)
 ANTICOAGULATION CONSULT NOTE  Pharmacy Consult for Heparin Indication: DVT despite Pradaxa therapy  Allergies  Allergen Reactions   Morphine Itching    Per pt     Patient Measurements: Height: 5\' 2"  (157.5 cm) Weight: 74.4 kg (164 lb 0.4 oz) IBW/kg (Calculated) : 50.1 Heparin Dosing Weight: 66.2 kg  Vital Signs: Temp: 98.8 F (37.1 C) (03/18 0812) Temp Source: Oral (03/18 0812) BP: 104/66 (03/18 0812) Pulse Rate: 63 (03/18 0812)  Labs: Recent Labs    03/09/24 1527 03/09/24 2100 03/09/24 2100 03/10/24 0030 03/10/24 1028 03/11/24 0546 03/11/24 0646  HGB 13.8  --   --  12.9  --  11.2*  --   HCT 39.6  --   --  37.1  --  31.5*  --   PLT 88*  --   --  93*  --  82*  --   APTT  --  42*  --   --   --   --   --   LABPROT 20.7*  --   --   --   --   --   --   INR 1.8*  --   --   --   --   --   --   HEPARINUNFRC  --  <0.10*   < > 0.31 0.19*  --  0.18*  CREATININE 0.90  --   --  0.81  --  0.77  --    < > = values in this interval not displayed.    Estimated Creatinine Clearance: 78.5 mL/min (by C-G formula based on SCr of 0.77 mg/dL).   Medical History: Past Medical History:  Diagnosis Date   Diabetes mellitus without complication (HCC)    Fatty liver    Hyperlipidemia    Protein C deficiency (HCC)    Protein S deficiency (HCC)     Medications:  Medications Prior to Admission  Medication Sig Dispense Refill Last Dose/Taking   dabigatran (PRADAXA) 150 MG CAPS capsule Take 1 capsule (150 mg total) by mouth every 12 (twelve) hours. 60 capsule 0 03/09/2024 at  7:00 AM   ergocalciferol (VITAMIN D2) 1.25 MG (50000 UT) capsule Take 50,000 Units by mouth once a week. Mondays   03/03/2024   ezetimibe (ZETIA) 10 MG tablet Take 10 mg by mouth daily.   03/09/2024   folic acid (FOLVITE) 400 MCG tablet Take 0.5 tablets by mouth daily.   03/09/2024   insulin degludec (TRESIBA FLEXTOUCH) 100 UNIT/ML FlexTouch Pen Inject 26 Units into the skin daily.   03/09/2024 Morning   insulin  lispro (HUMALOG KWIKPEN) 100 UNIT/ML KwikPen Inject into the skin as directed. per sliding scale Subcutaneous three times per day for 30 days   Past Week   levothyroxine (SYNTHROID) 50 MCG tablet Take 50 mcg by mouth daily.   03/09/2024   Potassium (POTASSIMIN PO) Take 1 tablet by mouth every evening.   03/08/2024   Continuous Glucose Sensor (FREESTYLE LIBRE 3 PLUS SENSOR) MISC change every 15 days for 30 days      desvenlafaxine (PRISTIQ) 25 MG 24 hr tablet Take 25 mg by mouth daily. (Patient not taking: Reported on 03/09/2024)   Not Taking   Scheduled:   ezetimibe  10 mg Oral Daily   insulin aspart  0-9 Units Subcutaneous TID WC   insulin glargine  26 Units Subcutaneous Daily   levothyroxine  50 mcg Oral Daily   sodium chloride flush  3 mL Intravenous Q12H   Infusions:   sodium chloride  heparin 1,400 Units/hr (03/11/24 0715)   PRN: sodium chloride, acetaminophen, diphenhydrAMINE, hydrALAZINE, HYDROmorphone (DILAUDID) injection, labetalol, ondansetron (ZOFRAN) IV, oxyCODONE-acetaminophen, sodium chloride flush  Assessment: 53 yof with a history of protein S and C deficiency, DVT, PE, IVC thrombosis . Patient is presenting with worsening pain to the L thigh with color changes to the thigh. Heparin per pharmacy consult placed for   DVT despite Pradaxa therapy .  Recently patient placed on Pradaxa after failing enoxaparin therapy and being on argatroban and arixtra inpatient. Oncology and vascular following both would like heparin for now. Last Pradaxa dose 3/16AM ~0700. -s/p thrombectomy on 3/17  -heparin level= 0.18 on 1400 units/hr -hg= 11.2 (trend down, plt= 82 (history of low plt)  Goal of Therapy:  Heparin level 0.3-0.7 units/ml Monitor platelets by anticoagulation protocol: Yes   Plan:  -Increase heparin to 1600 units/hr -Heparin level in 6 hours and daily wth CBC daily  Harland German, PharmD Clinical Pharmacist **Pharmacist phone directory can now be found on [email or web address] ([email or web address]).  Listed under [facility].

## 2024-03-12 ENCOUNTER — Encounter (HOSPITAL_COMMUNITY): Payer: Self-pay | Admitting: Vascular Surgery

## 2024-03-12 ENCOUNTER — Telehealth (HOSPITAL_BASED_OUTPATIENT_CLINIC_OR_DEPARTMENT_OTHER): Payer: Self-pay

## 2024-03-12 DIAGNOSIS — I8290 Acute embolism and thrombosis of unspecified vein: Secondary | ICD-10-CM | POA: Diagnosis not present

## 2024-03-12 DIAGNOSIS — D6859 Other primary thrombophilia: Secondary | ICD-10-CM | POA: Diagnosis not present

## 2024-03-12 LAB — BASIC METABOLIC PANEL
Anion gap: 7 (ref 5–15)
BUN: 6 mg/dL (ref 6–20)
CO2: 24 mmol/L (ref 22–32)
Calcium: 8.1 mg/dL — ABNORMAL LOW (ref 8.9–10.3)
Chloride: 102 mmol/L (ref 98–111)
Creatinine, Ser: 0.7 mg/dL (ref 0.44–1.00)
GFR, Estimated: >60 mL/min (ref >60)
Glucose, Bld: 112 mg/dL — ABNORMAL HIGH (ref 70–99)
Potassium: 4.1 mmol/L (ref 3.5–5.1)
Sodium: 133 mmol/L — ABNORMAL LOW (ref 135–145)

## 2024-03-12 LAB — CBC
HCT: 33.1 % — ABNORMAL LOW (ref 36.0–46.0)
Hemoglobin: 11.6 g/dL — ABNORMAL LOW (ref 12.0–15.0)
MCH: 36.7 pg — ABNORMAL HIGH (ref 26.0–34.0)
MCHC: 35 g/dL (ref 30.0–36.0)
MCV: 104.7 fL — ABNORMAL HIGH (ref 80.0–100.0)
Platelets: 102 10*3/uL — ABNORMAL LOW (ref 150–400)
RBC: 3.16 MIL/uL — ABNORMAL LOW (ref 3.87–5.11)
RDW: 13.9 % (ref 11.5–15.5)
WBC: 5.8 10*3/uL (ref 4.0–10.5)
nRBC: 0 % (ref 0.0–0.2)

## 2024-03-12 LAB — HEPARIN LEVEL (UNFRACTIONATED)
Heparin Unfractionated: 0.33 [IU]/mL (ref 0.30–0.70)
Heparin Unfractionated: 0.34 [IU]/mL (ref 0.30–0.70)

## 2024-03-12 LAB — GLUCOSE, CAPILLARY
Glucose-Capillary: 119 mg/dL — ABNORMAL HIGH (ref 70–99)
Glucose-Capillary: 164 mg/dL — ABNORMAL HIGH (ref 70–99)
Glucose-Capillary: 112 mg/dL — ABNORMAL HIGH (ref 70–99)

## 2024-03-12 LAB — MAGNESIUM: Magnesium: 1.8 mg/dL (ref 1.7–2.4)

## 2024-03-12 MED ORDER — MAGNESIUM SULFATE 2 GM/50ML IV SOLN
2.0000 g | Freq: Once | INTRAVENOUS | Status: AC
  Administered 2024-03-12: 2 g via INTRAVENOUS
  Filled 2024-03-12: qty 50

## 2024-03-12 MED ORDER — ASPIRIN 81 MG PO TBEC
162.0000 mg | DELAYED_RELEASE_TABLET | Freq: Every day | ORAL | Status: DC
Start: 2024-03-12 — End: 2024-03-14
  Administered 2024-03-12 – 2024-03-14 (×3): 162 mg via ORAL
  Filled 2024-03-12 (×3): qty 2

## 2024-03-12 MED FILL — Lidocaine HCl Local Preservative Free (PF) Inj 1%: INTRAMUSCULAR | Qty: 30 | Status: AC

## 2024-03-12 NOTE — Plan of Care (Signed)

## 2024-03-12 NOTE — Progress Notes (Addendum)
 ANTICOAGULATION CONSULT NOTE  Pharmacy Consult for Heparin Indication: DVT despite Pradaxa therapy  Allergies  Allergen Reactions   Morphine Itching    Per pt     Patient Measurements: Height: 5\' 2"  (157.5 cm) Weight: 74.4 kg (164 lb 0.4 oz) IBW/kg (Calculated) : 50.1 Heparin Dosing Weight: 66.2 kg  Vital Signs: Temp: 98.8 F (37.1 C) (03/18 1951) Temp Source: Oral (03/18 1951) BP: 128/75 (03/18 1951) Pulse Rate: 65 (03/18 2118)  Labs: Recent Labs    03/09/24 1527 03/09/24 2100 03/09/24 2100 03/10/24 0030 03/10/24 1028 03/11/24 0546 03/11/24 0646 03/11/24 1638 03/12/24 0007  HGB 13.8  --   --  12.9  --  11.2*  --   --   --   HCT 39.6  --   --  37.1  --  31.5*  --   --   --   PLT 88*  --   --  93*  --  82*  --   --   --   APTT  --  42*  --   --   --   --   --   --   --   LABPROT 20.7*  --   --   --   --   --   --   --   --   INR 1.8*  --   --   --   --   --   --   --   --   HEPARINUNFRC  --  <0.10*   < > 0.31   < >  --  0.18* 0.28* 0.33  CREATININE 0.90  --   --  0.81  --  0.77  --   --   --    < > = values in this interval not displayed.    Estimated Creatinine Clearance: 78.5 mL/min (by C-G formula based on SCr of 0.77 mg/dL).  Assessment: 72 yof with a history of protein S and C deficiency, DVT, PE, IVC thrombosis . Patient is presenting with worsening pain to the L thigh with color changes to the thigh. Heparin per pharmacy consult placed for   DVT despite Pradaxa therapy .  Recently patient placed on Pradaxa after failing enoxaparin therapy and being on argatroban and arixtra inpatient. Oncology and vascular following both would like heparin for now. Last Pradaxa dose 3/16AM ~0700. -s/p thrombectomy on 3/17  -heparin level= 0.33 on 1750 units/hr. No infusion issues, continued minor oozing from IV site per RN (unchanged).  Goal of Therapy:  Heparin level 0.3-0.7 units/ml Monitor platelets by anticoagulation protocol: Yes   Plan:  -Continue heparin at  1750 units/hr -Confirmatory heparin level in 6 hours and daily wth CBC daily -Continue to monitor plts (93>82)  Shelby Conner, PharmD. Clinical Pharmacist 03/12/2024 12:41 AM

## 2024-03-12 NOTE — Progress Notes (Signed)
 PROGRESS NOTE  Lylliana Kitamura KGM:[id number] DOB: 1971/02/06 DOA: 03/09/2024 PCP: Joaquin Music, NP   LOS: 2 days   Brief narrative:   Anisah Kuck is a 53 y.o. female with past medical history of hyperlipidemia, protein S and C deficiency, fatty liver, diabetes mellitus and history of history of DVT and pulmonary embolism first diagnosed in 2006 when patient was pregnant on warfarin for a long time and had switched to low molecular weight heparin and also was briefly on Xarelto, was admitted in December 2024 for extensive DVT. At the time patient was was placed on Pradaxa presented to the hospital with worsening swelling and pain on her lower extremities.  Had an outpatient Doppler ultrasound which showed DVT of the left lower extremity.  Patient stated that she was compliant with Pradaxa and had not missed any doses.  Denied any chest pain or shortness of breath.  Of note, patient was recently admitted in February 2024 for extensive DVT of the lower extremities.  At that time vascular surgery was consulted and an patient underwent pharmacomechanical thrombectomy with filter removal and IVC stent placement.  Patient does have a history of liver cirrhosis secondary to fatty liver and alcohol use and follows up with [facility] but stopped following once no longer a candidate for transplant.  In the ED CT venogram showed IVC stent with nonocclusive thrombus and occlusive thrombus in the left external iliac vein, common femoral vein and femoral vein.  Dr. Lenell Antu vascular surgeon was consulted and patient was admitted hospital for thrombectomy.  Was started on heparin infusion.    Assessment/Plan: Principal Problem:   DVT (deep venous thrombosis) (HCC) Active Problems:   Decompensated hepatic cirrhosis (HCC)   Type 2 diabetes mellitus without complication (HCC)   Hyponatremia   Primary hypercoagulable state (HCC)   Macrocytosis, subclinical hypothyroidism   Thrombocytopenia  (HCC)   Occlusive thrombus  DVT of the left lower extremity with prior history of extensive DVT and PE with protein C and S deficiency. Lower extremity pain. Patient was on Pradaxa prior to this admission. Oncology and vascular surgery on board and underwent percutaneous mechanical thrombectomy on 03/10/2024.  Currently on heparin drip as per oncology.  Managed to continue heparin drip again today.  Diabetes mellitus type 2 with hyperglycemia  Continue sliding scale insulin, Evaristo Bury. Latest hemoglobin A1c of 8 .  Glycemic control is adequate.  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  Compensated at this time.  Hypothyroidism continue Synthroid.  Mild hypomagnesia.  Replenished and improved.  Latest magnesium of 1.8.  Macrocytosis secondary to vitamin B12.  Continue vitamin B12 supplements.  Thrombocytopenia secondary to cirrhosis of liver.  Latest platelet count of 102.Marland Kitchen  HIT panel was negative during last admission in December 2024.  Oncology on board and okay for heparin drip.  DVT prophylaxis: Heparin drip   Disposition: Likely home in 1 to 2 days when okay with oncology and vascular surgery.  Status is: Inpatient The patient is inpatient because: Heparin drip, status post vascular intervention with thrombectomy, pending clinical improvement, ongoing lower extremity pain.    Code Status:     Code Status: Full Code  Family Communication: none at bedside   Consultants: Vascular surgery Oncology  Procedures: percutaneous mechanical thrombectomy on 03/10/2024 of the left lower extremity by vascular surgery  Anti-infectives:  none  Anti-infectives (From admission, onward)    None      Subjective: Today, patient was seen and examined at bedside.  Complains of mild  to moderate lower extremity pain.  Denies any dyspnea, chest pain or fever or chills.    Objective: Vitals:   03/11/24 2118 03/12/24 0626  BP:  111/62  Pulse: 65 (!) 58  Resp:  17   Temp:  98.9 F (37.2 C)  SpO2: 95% 97%    Intake/Output Summary (Last 24 hours) at 03/12/2024 1314 Last data filed at 03/12/2024 1000 Gross per 24 hour  Intake 542.18 ml  Output --  Net 542.18 ml   Filed Weights   03/09/24 1511  Weight: 74.4 kg   Body mass index is 30 kg/m.   Physical Exam:  GENERAL: Patient is alert awake and oriented. Not in obvious distress.  Obese HENT: No scleral pallor or icterus. Pupils equally reactive to light. Oral mucosa is moist NECK: is supple, no gross swelling noted. CHEST: Clear to auscultation. No crackles or wheezes. CVS: S1 and S2 heard, no murmur. Regular rate and rhythm.  ABDOMEN: Soft, non-tender, bowel sounds are present. EXTREMITIES: Left leg with gross edema and tenderness on palpation CNS: Cranial nerves are intact. No focal motor deficits. SKIN: warm and dry without rashes.  Data Review: I have personally reviewed the following laboratory data and studies,  CBC: Recent Labs  Lab 03/09/24 1527 03/10/24 0030 03/11/24 0546 03/12/24 0658  WBC 6.7 5.6 5.5 5.8  HGB 13.8 12.9 11.2* 11.6*  HCT 39.6 37.1 31.5* 33.1*  MCV 104.8* 103.9* 103.3* 104.7*  PLT 88* 93* 82* 102*   Basic Metabolic Panel: Recent Labs  Lab 03/09/24 1527 03/10/24 0030 03/11/24 0546 03/12/24 0658  NA 133* 136 134* 133*  K 3.6 3.3* 3.6 4.1  CL 96* 101 100 102  CO2 21* 26 24 24   GLUCOSE 368* 159* 107* 112*  BUN 12 9 9 6   CREATININE 0.90 0.81 0.77 0.70  CALCIUM 8.8* 8.7* 7.8* 8.1*  MG  --   --  1.6* 1.8   Liver Function Tests: Recent Labs  Lab 03/09/24 1527  AST 36  ALT 24  ALKPHOS 119  BILITOT 2.1*  PROT 7.1  ALBUMIN 3.1*   No results for input(s): "LIPASE", "AMYLASE" in the last 168 hours. No results for input(s): "AMMONIA" in the last 168 hours. Cardiac Enzymes: No results for input(s): "CKTOTAL", "CKMB", "CKMBINDEX", "TROPONINI" in the last 168 hours. BNP (last 3 results) No results for input(s): "BNP" in the last 8760  hours.  ProBNP (last 3 results) No results for input(s): "PROBNP" in the last 8760 hours.  CBG: Recent Labs  Lab 03/11/24 1150 03/11/24 1620 03/11/24 2107 03/12/24 0634 03/12/24 1148  GLUCAP 110* 150* 134* 119* 164*   No results found for this or any previous visit (from the past 240 hours).   Studies: PERIPHERAL VASCULAR CATHETERIZATION Result Date: 03/10/2024 Images from the original result were not included. Patient name: Lilu Mcglown MRN: [id number] DOB: 1971/01/16 Sex: female 03/10/2024 Pre-operative Diagnosis: Extensive left lower extremity DVT with thrombus in the IVC stent and left common iliac vein stent Post-operative diagnosis:  Same Surgeon:  Apolinar Junes C. Randie Heinz, MD Procedure Performed: 1.  Percutaneous ultrasound-guided cannulation left popliteal vein 2.  Left lower extremity and central venogram 3.  Intravascular ultrasound left popliteal, left femoral, left common femoral, left external and common iliac veins and IVC 4.  Percutaneous mechanical thrombectomy using Inari Clottriever XL and bold catheters of IVC, left common and external iliac vein including left common iliac vein stent, left common femoral vein and femoral vein 5.  Balloon venoplasty of left common and external iliac  veins and common femoral vein with 10 mm balloon and venoplasty of left femoral vein and popliteal vein with 7 mm balloon 6.  Moderate sedation with fentanyl Versed for 92 minutes Indications: 53 year old female with a remote history of IVC filter and left common iliac vein stent placement.  1 year ago she underwent removal of the filter with stenting of the IVC for right lower extremity edema.  She now presents with extensive left lower extremity DVT and edema with thrombus in the IVC stent as well as occlusion of the common iliac vein stent in the common femoral vein by CT venogram.  She is now indicated for venography with intravascular ultrasound and possible thrombectomy with possible repeat  stenting. Findings: The popliteal vein was difficult to access.  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  The IVC stent had initially approximately 75% stenosis this was reduced to less than 30% residual stenosis after mechanical thrombectomy there was no ballooning performed of the IVC stent.  The left common iliac vein stent was also subtotally occluded and after thrombectomy and balloon venoplasty this was reduced to 0% residual stenosis and there was brisk flow through the stent.  Mechanical thrombectomy also removed what appeared to be approximately a 4 cm balloon that was not known to be present within the stents over the veins and was likely from a previous procedure.  There was chronic and subacute thrombus of the common femoral vein which was removed with thrombectomy and balloon venoplasty reduced this to less than 20% residual stenosis and there was a brisk flow channel through there without any reflux into the profunda vein and the femoral vein which was initially occluded and ballooned with 7 mm balloon then demonstrated a brisk flow channel as well without reflux into collateral veins.  Procedure:  The patient was identified in the holding area and taken to room 8.  The patient was then placed prone on the table and prepped and draped in the usual sterile fashion.  A time out was called.  Ultrasound was used to evaluate initially the left small saphenous vein but after multiple unsuccessful attempts to get a wire to pass I then cannulated the popliteal vein but again did not have backbleeding but was able to get a wire to pass under fluoroscopic guidance.  At this time 7000 units of heparin was administered.  I then placed a micropuncture sheath followed by a Glidewire advantage.  I placed an 8 French sheath and performed venography which demonstrated occlusion of the femoral vein.  I was able  to cross this occlusion in the mid leg with Glidewire advantage and then performed balloon venoplasty with 7 mm balloon.  I then placed the Inari 16 French sheath.  This was also ballooned open at the base with a 7 mm balloon.  We then were able to get the Glidewire advantage up to the IVC and perform venography of the IVC and then a Rosen wire initially followed by an Amplatz wire was placed into the right subclavian vein.  We performed intravascular ultrasound including the IVC, left common and external iliac veins left, left common femoral and femoral veins.  We then began with the clot Treiver XL which was used within the IVC stent and was removed through the common iliac vein stent and the femoral vein not fully deployed.  This was used for  3 separate passes until there was a clear pass and the clot retrieved all appeared chronic and subacute.  We then used the clot Treiver bold in the left common iliac vein stent and left common femoral vein.  We then performed balloon venoplasty of the left common iliac vein stent and left common femoral vein with 10 mm balloon.  Completion demonstrated brisk flow channel through the femoral vein into the common femoral vein on the left and into the left common and external neck veins and IVC.  IVUS demonstrated much improved flow channel through the IVC stent with no residual stenosis in the left common iliac vein stent and a a large flow channel through the common femoral vein and a channel in the femoral vein as well.  Satisfied with this the wire was removed and the cannulation site was suture-ligated with 4-0 Monocryl suture.  The patient tolerated the procedure without any complication. Contrast: 60 cc Brandon C. Randie Heinz, MD [facility] [place] Office: [phone] Pager: [phone]      Joycelyn Das, MD  [facility] 03/12/2024  If 7PM-7AM, please contact night-coverage

## 2024-03-12 NOTE — Plan of Care (Signed)
  Problem: Health Behavior/Discharge Planning: Goal: Ability to manage health-related needs will improve Outcome: Progressing   Problem: Clinical Measurements: Goal: Will remain free from infection Outcome: Progressing Goal: Respiratory complications will improve Outcome: Progressing Goal: Cardiovascular complication will be avoided Outcome: Progressing   Problem: Activity: Goal: Risk for activity intolerance will decrease Outcome: Progressing   Problem: Nutrition: Goal: Adequate nutrition will be maintained Outcome: Progressing   Problem: Safety: Goal: Ability to remain free from injury will improve Outcome: Progressing   Problem: Nutritional: Goal: Maintenance of adequate nutrition will improve Outcome: Progressing   Problem: Cardiovascular: Goal: Ability to achieve and maintain adequate cardiovascular perfusion will improve Outcome: Progressing

## 2024-03-12 NOTE — Progress Notes (Signed)
 ANTICOAGULATION CONSULT NOTE  Pharmacy Consult for Heparin Indication: DVT  Allergies  Allergen Reactions   Morphine Itching    Per pt     Patient Measurements: Height: 5\' 2"  (157.5 cm) Weight: 74.4 kg (164 lb 0.4 oz) IBW/kg (Calculated) : 50.1 Heparin Dosing Weight: 66.2 kg  Vital Signs: Temp: 98.9 F (37.2 C) (03/19 0626) Temp Source: Oral (03/19 0626) BP: 111/62 (03/19 0626) Pulse Rate: 58 (03/19 0626)  Labs: Recent Labs    03/09/24 1527 03/09/24 1527 03/09/24 2100 03/10/24 0030 03/10/24 1028 03/11/24 0546 03/11/24 0646 03/11/24 1638 03/12/24 0007 03/12/24 0658  HGB 13.8  --   --  12.9  --  11.2*  --   --   --  11.6*  HCT 39.6  --   --  37.1  --  31.5*  --   --   --  33.1*  PLT 88*  --   --  93*  --  82*  --   --   --  102*  APTT  --   --  42*  --   --   --   --   --   --   --   LABPROT 20.7*  --   --   --   --   --   --   --   --   --   INR 1.8*  --   --   --   --   --   --   --   --   --   HEPARINUNFRC  --    < > <0.10* 0.31   < >  --    < > 0.28* 0.33 0.34  CREATININE 0.90  --   --  0.81  --  0.77  --   --   --  0.70   < > = values in this interval not displayed.    Estimated Creatinine Clearance: 78.5 mL/min (by C-G formula based on SCr of 0.7 mg/dL).  Assessment: 47 yof with a history of protein S and C deficiency, DVT, PE, IVC thrombosis . Patient is presenting with worsening pain to the L thigh with color changes to the thigh. Heparin per pharmacy consult placed for   DVT despite Pradaxa therapy  -though see Dr Myna Hidalgo note 3/19 possible not dabigitran failure.  Recently patient placed on Pradaxa after failing enoxaparin therapy and being on argatroban and arixtra inpatient. Oncology and vascular following both would like heparin for now. Last Pradaxa dose 3/16AM ~0700. -s/p thrombectomy on 3/17  Heparin drip rate  1750 units/hr with heparin level at bottom of range 0.3 . No infusion issues, continued minor oozing from IV site per RN (unchanged). Hgb  stable 11, pltc improved 100  Goal of Therapy:  Heparin level 0.3-0.7 units/ml Monitor platelets by anticoagulation protocol: Yes   Plan:  Increase  heparin to 1800 uts/hr to ensure stays with in therapeutic range  Daily heparin level and  CBC    Leota Sauers Pharm.D. CPP, BCPS Clinical Pharmacist [phone] 03/12/2024 2:14 PM

## 2024-03-12 NOTE — Progress Notes (Signed)
 She continues on the heparin infusion.  The left leg is not as swollen.  There is still a little bit of firmness in the lower leg.  She does have decent pulse.  The wrapping has been taken off.  She has had no bleeding.  She has had no nausea or vomiting.  She has had no chest wall pain.  There has been no cough.  She has had no rashes.  There is been no headache.   Her vital signs show temperature 98.8.  Pulse 65.  Blood pressure 128/75.  Her lungs are clear bilaterally.  She has good breath sounds bilaterally.  She had air movement bilaterally.  Cardiac exam regular rate and rhythm.  She has no murmurs.  Abdomen is soft.  Bowel sounds are present.  There is no fluid wave.  There is no palpable liver or spleen tip.  Extremities shows the left leg is level of the swelling and hyperemia.  She has decent pulses in the distal extremity.  Right leg is unremarkable.  Neurological exam is nonfocal.  Again, I would probably keep her on heparin for right now.  I am still not sure as to how we can get her on outpatient anticoagulation.  Still am not convinced that she failed Pradaxa.  I think this is much more of an anatomic issue than anything else.  1 thing I thought about with may be adding aspirin to her regimen.  I think maybe baby aspirin would not be a bad idea.  I do not see any obvious contraindication to the baby aspirin.  She has little bit of thrombocytopenia but I do not see this as a real problem.  Hopefully, should be able to go home in a couple days.  I does think that she needs heparin as an inpatient for good anticoagulation.  I would certainly try to keep her heparin levels as therapeutic as possible to try to maintain effectiveness.  I do appreciate the staff up on 6 E.  They are doing a great job with her.   Christin Bach, MD  Hebrews 12:12

## 2024-03-13 ENCOUNTER — Encounter (HOSPITAL_BASED_OUTPATIENT_CLINIC_OR_DEPARTMENT_OTHER): Payer: Self-pay

## 2024-03-13 ENCOUNTER — Other Ambulatory Visit (HOSPITAL_COMMUNITY): Payer: Self-pay

## 2024-03-13 ENCOUNTER — Ambulatory Visit (HOSPITAL_BASED_OUTPATIENT_CLINIC_OR_DEPARTMENT_OTHER): Admission: RE | Admit: 2024-03-13 | Source: Ambulatory Visit

## 2024-03-13 ENCOUNTER — Telehealth (HOSPITAL_COMMUNITY): Payer: Self-pay | Admitting: Pharmacy Technician

## 2024-03-13 DIAGNOSIS — D6859 Other primary thrombophilia: Secondary | ICD-10-CM | POA: Diagnosis not present

## 2024-03-13 DIAGNOSIS — I8290 Acute embolism and thrombosis of unspecified vein: Secondary | ICD-10-CM | POA: Diagnosis not present

## 2024-03-13 DIAGNOSIS — I82412 Acute embolism and thrombosis of left femoral vein: Secondary | ICD-10-CM | POA: Diagnosis not present

## 2024-03-13 DIAGNOSIS — I82422 Acute embolism and thrombosis of left iliac vein: Secondary | ICD-10-CM | POA: Diagnosis not present

## 2024-03-13 LAB — CBC
HCT: 33.9 % — ABNORMAL LOW (ref 36.0–46.0)
Hemoglobin: 11.9 g/dL — ABNORMAL LOW (ref 12.0–15.0)
MCH: 37.1 pg — ABNORMAL HIGH (ref 26.0–34.0)
MCHC: 35.1 g/dL (ref 30.0–36.0)
MCV: 105.6 fL — ABNORMAL HIGH (ref 80.0–100.0)
Platelets: 128 10*3/uL — ABNORMAL LOW (ref 150–400)
RBC: 3.21 MIL/uL — ABNORMAL LOW (ref 3.87–5.11)
RDW: 14 % (ref 11.5–15.5)
WBC: 5.4 10*3/uL (ref 4.0–10.5)
nRBC: 0 % (ref 0.0–0.2)

## 2024-03-13 LAB — HEPARIN LEVEL (UNFRACTIONATED): Heparin Unfractionated: 0.27 [IU]/mL — ABNORMAL LOW (ref 0.30–0.70)

## 2024-03-13 LAB — GLUCOSE, CAPILLARY
Glucose-Capillary: 104 mg/dL — ABNORMAL HIGH (ref 70–99)
Glucose-Capillary: 146 mg/dL — ABNORMAL HIGH (ref 70–99)
Glucose-Capillary: 158 mg/dL — ABNORMAL HIGH (ref 70–99)

## 2024-03-13 MED ORDER — FONDAPARINUX SODIUM 7.5 MG/0.6ML ~~LOC~~ SOLN
7.5000 mg | SUBCUTANEOUS | Status: DC
  Administered 2024-03-13 – 2024-03-14 (×2): 7.5 mg via SUBCUTANEOUS
  Filled 2024-03-13 (×2): qty 0.6

## 2024-03-13 MED ORDER — HYDROMORPHONE HCL 1 MG/ML IJ SOLN
0.5000 mg | INTRAMUSCULAR | Status: DC | PRN
  Administered 2024-03-13 (×2): 0.5 mg via INTRAVENOUS
  Filled 2024-03-13 (×2): qty 1

## 2024-03-13 MED ORDER — MUPIROCIN CALCIUM 2 % EX CREA
TOPICAL_CREAM | Freq: Three times a day (TID) | CUTANEOUS | Status: DC
Start: 2024-03-13 — End: 2024-03-14
  Filled 2024-03-13: qty 15

## 2024-03-13 MED ORDER — POLYETHYLENE GLYCOL 3350 17 G PO PACK
17.0000 g | PACK | Freq: Every day | ORAL | Status: DC
  Filled 2024-03-13: qty 1

## 2024-03-13 MED ORDER — CEPHALEXIN 500 MG PO CAPS
500.0000 mg | ORAL_CAPSULE | Freq: Three times a day (TID) | ORAL | Status: DC
Start: 2024-03-13 — End: 2024-03-18
  Administered 2024-03-13 – 2024-03-14 (×4): 500 mg via ORAL
  Filled 2024-03-13 (×5): qty 1

## 2024-03-13 MED ORDER — DIPHENHYDRAMINE HCL 50 MG/ML IJ SOLN
25.0000 mg | Freq: Four times a day (QID) | INTRAMUSCULAR | Status: DC | PRN
  Administered 2024-03-13 – 2024-03-14 (×3): 25 mg via INTRAVENOUS
  Filled 2024-03-13 (×3): qty 1

## 2024-03-13 MED ORDER — DOCUSATE SODIUM 100 MG PO CAPS
100.0000 mg | ORAL_CAPSULE | Freq: Two times a day (BID) | ORAL | Status: DC
  Administered 2024-03-13 – 2024-03-14 (×3): 100 mg via ORAL
  Filled 2024-03-13 (×3): qty 1

## 2024-03-13 NOTE — Plan of Care (Signed)
  Problem: Clinical Measurements: Goal: Respiratory complications will improve Outcome: Progressing Goal: Cardiovascular complication will be avoided Outcome: Progressing   Problem: Activity: Goal: Risk for activity intolerance will decrease Outcome: Progressing   Problem: Nutrition: Goal: Adequate nutrition will be maintained Outcome: Progressing   Problem: Elimination: Goal: Will not experience complications related to bowel motility Outcome: Progressing   Problem: Pain Managment: Goal: General experience of comfort will improve and/or be controlled Outcome: Progressing   Problem: Safety: Goal: Ability to remain free from injury will improve Outcome: Progressing   Problem: Nutritional: Goal: Maintenance of adequate nutrition will improve Outcome: Progressing   Problem: Activity: Goal: Ability to return to baseline activity level will improve Outcome: Progressing   Problem: Cardiovascular: Goal: Ability to achieve and maintain adequate cardiovascular perfusion will improve Outcome: Progressing

## 2024-03-13 NOTE — Progress Notes (Signed)
 So far, everything is about the same.  She still has some swelling in the left leg.  It does not feel as tight.  She has some discomfort up in the upper inner left thigh.  She continues on the heparin infusion.  There is been no bleeding.  Looks that she may have little bit of infection in the left forearm where she had IV.  There is some purulent discharge.  I will give her some Bactroban and there is some oral antibiotic.  I think that we should try her on Arixtra.  She has not been on Arixtra.  I explained how this works.  I explained that it is once a day.  We will have to see if insurance will cover the Arixtra as an outpatient.  Her appetite is okay.  There is no nausea or or vomiting.  Her vital signs are temperature 99.  Pulse 60.  Blood pressure 116/82.  Her head neck exam shows no ocular or oral lesions.  Her lungs are clear bilaterally.  Cardiac exam regular rate and rhythm.  Abdomen is soft.  Bowel sounds are present.  There is no fluid wave.  There is no palpable liver or spleen tip.  Extremities still show some swelling and slight hyperemia of the left leg.  She does have a decent pulse in the distal artery.  Right leg is unremarkable.  She does have a area of erythema and a slight pustule on the flexor aspect of the proximal forearm.  Neurological exam shows no focal neurological deficits.  Again, we will see about switching over to Arixtra.  Again I would like to have that insurance will cover the Arixtra as an outpatient.  Thing of all looks good, then she can probably go home tomorrow.  We will have to watch this infection on the left forearm.  I know that she has gotten great care from everybody upon 6 E.   Christin Bach, MD  Hebrews 12:12

## 2024-03-13 NOTE — Final Progress Note (Signed)
 Arixtra dose given [id number]. Ok to stop heparin after dose of Arixtra per Dr. Myna Hidalgo.

## 2024-03-13 NOTE — Progress Notes (Signed)
  Progress Note    03/13/2024 10:00 AM 3 Days Post-Op  Subjective: She has persistent lower extremity from.  Vitals:   03/13/24 0420 03/13/24 0823  BP: 116/82 128/82  Pulse: 60 70  Resp: 18 18  Temp: 99 F (37.2 C) 98.5 F (36.9 C)  SpO2:      Physical Exam: Awake alert  Nonlabored respirations  compartments of the left lower are soft and dorsalis pedis pulse is palpable  CBC    Component Value Date/Time   WBC 5.4 03/13/2024 0500   RBC 3.21 (L) 03/13/2024 0500   HGB 11.9 (L) 03/13/2024 0500   HGB 14.6 03/03/2024 0847   HCT 33.9 (L) 03/13/2024 0500   PLT 128 (L) 03/13/2024 0500   PLT 98 (L) 03/03/2024 0847   MCV 105.6 (H) 03/13/2024 0500   MCH 37.1 (H) 03/13/2024 0500   MCHC 35.1 03/13/2024 0500   RDW 14.0 03/13/2024 0500   LYMPHSABS 1.3 03/03/2024 0847   MONOABS 0.4 03/03/2024 0847   EOSABS 0.1 03/03/2024 0847   BASOSABS 0.1 03/03/2024 0847    BMET    Component Value Date/Time   NA 133 (L) 03/12/2024 0658   K 4.1 03/12/2024 0658   CL 102 03/12/2024 0658   CO2 24 03/12/2024 0658   GLUCOSE 112 (H) 03/12/2024 0658   BUN 6 03/12/2024 0658   CREATININE 0.70 03/12/2024 0658   CREATININE 0.70 03/03/2024 0847   CALCIUM 8.1 (L) 03/12/2024 0658   GFRNONAA >60 03/12/2024 0658   GFRNONAA >60 03/03/2024 0847   GFRAA  10/22/2009 0450    >60        The eGFR has been calculated using the MDRD equation. This calculation has not been validated in all clinical situations. eGFR's persistently <60 mL/min signify possible Chronic Kidney Disease.    INR    Component Value Date/Time   INR 1.8 (H) 03/09/2024 1527    No intake or output data in the 24 hours ending 03/13/24 1000   Assessment:  53 y.o. female is s/p left lower extremity and thrombectomy  Plan: Continue with thigh-high compression stockings Plan is for Arixtra at the direction of Dr. Myna Hidalgo who follows closely. Office to arrange for 6-week follow-up with IVC iliac Plex.   Ossie Beltran C. Randie Heinz,  MD [facility] Office: [phone] Pager: [phone]  03/13/2024 10:00 AM

## 2024-03-13 NOTE — Telephone Encounter (Signed)
 Patient Product/process development scientist completed.    The patient is insured through [employer]. Patient has [employer], may use a copay card, and/or apply for patient assistance if available.    Ran test claim for fondaparinux (Arixtra) 7.5 mg/0.6 ml and the current 30 day co-pay is $15.00.   This test claim was processed through [facility]- copay amounts may vary at other pharmacies due to pharmacy/plan contracts, or as the patient moves through the different stages of their insurance plan.     Roland Earl, CPHT Pharmacy Technician III Certified Patient Advocate [facility] Pharmacy Patient Advocate Team Direct Number: [phone]  Fax: [phone]

## 2024-03-13 NOTE — TOC Initial Note (Signed)
 Transition of Care [facility]) - Initial/Assessment Note    Patient Details  Name: Shelby Conner MRN: [id number] Date of Birth: 16-Jan-1971  [facility]) CM/SW Contact:    Gala Lewandowsky, RN Phone Number: 03/13/2024, 1:29 PM  Clinical Narrative: Patient presented for lower extremity swelling and pain. Benefits check completed for Arixtra co pay is $15.00 and patient is aware of cost. No home needs identified at this time.                  Expected Discharge Plan: Home/Self Care Barriers to Discharge: No Barriers Identified   Patient Goals and CMS Choice Patient states their goals for this hospitalization and ongoing recovery are:: plan to return home.   Choice offered to / list presented to : NA  Expected Discharge Plan and Services   Discharge Planning Services: NA Post Acute Care Choice: NA Living arrangements for the past 2 months: Single Family Home                   DME Agency: NA    Prior Living Arrangements/Services Living arrangements for the past 2 months: Single Family Home Lives with:: Spouse Patient language and need for interpreter reviewed:: Yes Do you feel safe going back to the place where you live?: Yes      Need for Family Participation in Patient Care: No (Comment) Care giver support system in place?: No (comment)   Criminal Activity/Legal Involvement Pertinent to Current Situation/Hospitalization: No - Comment as needed  Activities of Daily Living   ADL Screening (condition at time of admission) Independently performs ADLs?: Yes (appropriate for developmental age) Is the patient deaf or have difficulty hearing?: No Does the patient have difficulty seeing, even when wearing glasses/contacts?: No Does the patient have difficulty concentrating, remembering, or making decisions?: No  Permission Sought/Granted Permission sought to share information with : Family Supports, Magazine features editor, Case Manager      Emotional Assessment Appearance:: Appears stated age Attitude/Demeanor/Rapport: Engaged Affect (typically observed): Appropriate Orientation: : Oriented to Self, Oriented to Place, Oriented to  Time, Oriented to Situation Alcohol / Substance Use: Not Applicable Psych Involvement: No (comment)  Admission diagnosis:  Occlusive thrombus [I82.90] DVT (deep venous thrombosis) (HCC) [I82.409] Deep vein thrombosis (DVT) of iliac vein of left lower extremity, unspecified chronicity (HCC) [I82.422] Patient Active Problem List   Diagnosis Date Noted   Occlusive thrombus 03/10/2024   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) 12/20/2023   Hypercoagulable state (HCC) 12/20/2023   Macrocytosis, subclinical hypothyroidism 12/20/2023   Thrombocytopenia (HCC) 12/20/2023   Cirrhosis (HCC) 12/20/2023   Acute pulmonary embolism (HCC) 12/20/2023   DM2 (diabetes mellitus, type 2) (HCC) 12/20/2023   Aortic atherosclerosis (HCC) 12/20/2023   IVC thrombosis (HCC) 02/01/2023   Primary hypercoagulable state (HCC) 02/01/2023   DVT (deep venous thrombosis) (HCC) 01/27/2023   History of cirrhosis 01/27/2023   Hyponatremia 01/27/2023   AKI (acute kidney injury) (HCC) 12/02/2020   Acute hepatic encephalopathy (HCC) 12/02/2020   Metabolic acidosis 12/02/2020   [place] grade C esophagitis 12/02/2020   Coagulopathy (HCC) 12/02/2020   Hypokalemia 11/12/2020   Protein S deficiency (HCC) 11/12/2020   Protein C deficiency (HCC) 11/12/2020   Ascites 11/12/2020   Type 2 diabetes mellitus without complication (HCC) 11/12/2020   Palliative care by specialist    Goals of care, counseling/discussion    Full code status    Decompensated hepatic cirrhosis (HCC) 11/11/2020   PCP:  Joaquin Music, NP Pharmacy:   [facility]  [address] [place], [place] - [address] [place] [address] Phone: [phone] Fax: [phone]  [facility] [address] [place] [address] Phone: [phone] Fax: [phone]  Social Drivers of Health (SDOH) Social History: SDOH Screenings   Food Insecurity: No Food Insecurity (03/09/2024)  Housing: Low Risk  (03/09/2024)  Transportation Needs: No Transportation Needs (03/09/2024)  Utilities: Not At Risk (03/09/2024)  Social Connections: Unknown (05/09/2022)   Received from [facility], [facility]  Tobacco Use: Medium Risk (03/09/2024)   Readmission Risk Interventions    02/08/2023   11:48 AM  Readmission Risk Prevention Plan  Post Dischage Appt Complete  Medication Screening Complete  Transportation Screening Complete

## 2024-03-13 NOTE — Progress Notes (Signed)
 PROGRESS NOTE  Shelby Conner WUJ:[id number] DOB: 11-Sep-1971 DOA: 03/09/2024 PCP: Joaquin Music, NP   LOS: 3 days   Brief narrative:   Shelby Conner is a 53 y.o. female with past medical history of hyperlipidemia, protein S and C deficiency, fatty liver, diabetes mellitus and history of history of DVT and pulmonary embolism first diagnosed in 2006 when patient was pregnant on warfarin for a long time and had switched to low molecular weight heparin and also was briefly on Xarelto, was admitted in December 2024 for extensive DVT. At the time patient was was placed on Pradaxa presented to the hospital with worsening swelling and pain on her lower extremities.  Had an outpatient Doppler ultrasound which showed DVT of the left lower extremity.  Patient stated that she was compliant with Pradaxa and had not missed any doses.  Denied any chest pain or shortness of breath.  Of note, patient was recently admitted in February 2024 for extensive DVT of the lower extremities.  At that time vascular surgery was consulted and an patient underwent pharmacomechanical thrombectomy with filter removal and IVC stent placement.  Patient does have a history of liver cirrhosis secondary to fatty liver and alcohol use and follows up with [facility] but stopped following once no longer a candidate for transplant.  In the ED, CT venogram showed IVC stent with nonocclusive thrombus and occlusive thrombus in the left external iliac vein, common femoral vein and femoral vein.  Dr. Lenell Antu vascular surgeon was consulted and patient was admitted hospital for thrombectomy.  Was started on heparin infusion.    Assessment/Plan: Principal Problem:   DVT (deep venous thrombosis) (HCC) Active Problems:   Decompensated hepatic cirrhosis (HCC)   Type 2 diabetes mellitus without complication (HCC)   Hyponatremia   Primary hypercoagulable state (HCC)   Macrocytosis, subclinical hypothyroidism   Thrombocytopenia  (HCC)   Occlusive thrombus  DVT of the left lower extremity with prior history of extensive DVT and PE with protein C and S deficiency. Lower extremity pain. Patient was on Pradaxa prior to this admission. Oncology and vascular surgery on board and underwent percutaneous mechanical thrombectomy on 03/10/2024.  Patient was continued on heparin drip and at this time has been transitioned to Arixtra as per oncology.    Diabetes mellitus type 2 with hyperglycemia  Continue sliding scale insulin, Evaristo Bury. Latest hemoglobin A1c of 8 .  Glycemic control is adequate.  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  Compensated at this time.  Used to follow-up at [facility] in the past but no longer since he is not candidate for transplant.  Hypothyroidism continue Synthroid.  Mild hypomagnesia.  Replenished and improved.  Latest magnesium of 1.8.  Right forearm erythema and pustule.  Has been started on Keflex.  Macrocytosis secondary to vitamin B12.  Continue vitamin B12 supplements.  Thrombocytopenia secondary to cirrhosis of liver.  Latest platelet count of 128.  HIT panel was negative during last admission in December 2024.  Oncology on board and okay for anticoagulation  DVT prophylaxis: Arixtra   Disposition: Likely home on 03/14/2024 if okay with oncology.  Status is: Inpatient  The patient is inpatient because: status post vascular intervention with thrombectomy, pending clinical improvement, ongoing lower extremity pain.    Code Status:     Code Status: Full Code  Family Communication: none at bedside   Consultants: Vascular surgery Oncology  Procedures: percutaneous mechanical thrombectomy on 03/10/2024 of the left lower extremity by vascular surgery  Anti-infectives:  none  Anti-infectives (From admission, onward)    Start     Dose/Rate Route Frequency Ordered Stop   03/13/24 0730  cephALEXin (KEFLEX) capsule 500 mg        500 mg Oral Every 8  hours 03/13/24 0643 03/18/24 0559      Subjective: Today, patient was seen and examined at bedside.  Patient complains of mild lower extremity pain.  No dyspnea shortness of breath fever chills or rigor.  Had some redness and swelling over the right forearm.  Objective: Vitals:   03/13/24 1104 03/13/24 1157  BP: 121/78 121/78  Pulse: 66 60  Resp: 16 16  Temp: 98.5 F (36.9 C) 98.5 F (36.9 C)  SpO2: 98% 100%    Intake/Output Summary (Last 24 hours) at 03/13/2024 1247 Last data filed at 03/13/2024 0840 Gross per 24 hour  Intake 240 ml  Output --  Net 240 ml   Filed Weights   03/09/24 1511  Weight: 74.4 kg   Body mass index is 30 kg/m.   Physical Exam:  GENERAL: Patient is alert awake and oriented. Not in obvious distress.  Obese HENT: No scleral pallor or icterus. Pupils equally reactive to light. Oral mucosa is moist NECK: is supple, no gross swelling noted. CHEST: Clear to auscultation. No crackles or wheezes. CVS: S1 and S2 heard, no murmur. Regular rate and rhythm.  ABDOMEN: Soft, non-tender, bowel sounds are present. EXTREMITIES: Left leg with gross edema and tenderness on palpation, swelling has decreased.  Right forearm with area of erythema and pustule formation. CNS: Cranial nerves are intact. No focal motor deficits. SKIN: warm and dry without rashes.  Data Review: I have personally reviewed the following laboratory data and studies,  CBC: Recent Labs  Lab 03/09/24 1527 03/10/24 0030 03/11/24 0546 03/12/24 0658 03/13/24 0500  WBC 6.7 5.6 5.5 5.8 5.4  HGB 13.8 12.9 11.2* 11.6* 11.9*  HCT 39.6 37.1 31.5* 33.1* 33.9*  MCV 104.8* 103.9* 103.3* 104.7* 105.6*  PLT 88* 93* 82* 102* 128*   Basic Metabolic Panel: Recent Labs  Lab 03/09/24 1527 03/10/24 0030 03/11/24 0546 03/12/24 0658  NA 133* 136 134* 133*  K 3.6 3.3* 3.6 4.1  CL 96* 101 100 102  CO2 21* 26 24 24   GLUCOSE 368* 159* 107* 112*  BUN 12 9 9 6   CREATININE 0.90 0.81 0.77 0.70   CALCIUM 8.8* 8.7* 7.8* 8.1*  MG  --   --  1.6* 1.8   Liver Function Tests: Recent Labs  Lab 03/09/24 1527  AST 36  ALT 24  ALKPHOS 119  BILITOT 2.1*  PROT 7.1  ALBUMIN 3.1*   No results for input(s): "LIPASE", "AMYLASE" in the last 168 hours. No results for input(s): "AMMONIA" in the last 168 hours. Cardiac Enzymes: No results for input(s): "CKTOTAL", "CKMB", "CKMBINDEX", "TROPONINI" in the last 168 hours. BNP (last 3 results) No results for input(s): "BNP" in the last 8760 hours.  ProBNP (last 3 results) No results for input(s): "PROBNP" in the last 8760 hours.  CBG: Recent Labs  Lab 03/12/24 0634 03/12/24 1148 03/12/24 1609 03/13/24 0718 03/13/24 1103  GLUCAP 119* 164* 112* 104* 146*   No results found for this or any previous visit (from the past 240 hours).   Studies: No results found.     Joycelyn Das, MD  [facility] 03/13/2024  If 7PM-7AM, please contact night-coverage

## 2024-03-13 NOTE — Plan of Care (Signed)
  Problem: Nutrition: Goal: Adequate nutrition will be maintained Outcome: Progressing   Problem: Coping: Goal: Level of anxiety will decrease Outcome: Progressing   Problem: Elimination: Goal: Will not experience complications related to bowel motility Outcome: Progressing   Problem: Pain Managment: Goal: General experience of comfort will improve and/or be controlled Outcome: Progressing   Problem: Safety: Goal: Ability to remain free from injury will improve Outcome: Progressing   Problem: Skin Integrity: Goal: Risk for impaired skin integrity will decrease Outcome: Progressing

## 2024-03-14 ENCOUNTER — Other Ambulatory Visit (HOSPITAL_COMMUNITY): Payer: Self-pay

## 2024-03-14 DIAGNOSIS — I82422 Acute embolism and thrombosis of left iliac vein: Secondary | ICD-10-CM | POA: Diagnosis not present

## 2024-03-14 DIAGNOSIS — I82492 Acute embolism and thrombosis of other specified deep vein of left lower extremity: Secondary | ICD-10-CM | POA: Diagnosis not present

## 2024-03-14 LAB — CBC
HCT: 35.7 % — ABNORMAL LOW (ref 36.0–46.0)
Hemoglobin: 12.3 g/dL (ref 12.0–15.0)
MCH: 36.2 pg — ABNORMAL HIGH (ref 26.0–34.0)
MCHC: 34.5 g/dL (ref 30.0–36.0)
MCV: 105 fL — ABNORMAL HIGH (ref 80.0–100.0)
Platelets: 141 10*3/uL — ABNORMAL LOW (ref 150–400)
RBC: 3.4 MIL/uL — ABNORMAL LOW (ref 3.87–5.11)
RDW: 14.3 % (ref 11.5–15.5)
WBC: 6.1 10*3/uL (ref 4.0–10.5)
nRBC: 0 % (ref 0.0–0.2)

## 2024-03-14 LAB — GLUCOSE, CAPILLARY: Glucose-Capillary: 118 mg/dL — ABNORMAL HIGH (ref 70–99)

## 2024-03-14 MED ORDER — OXYCODONE-ACETAMINOPHEN 10-325 MG PO TABS
1.0000 | ORAL_TABLET | Freq: Four times a day (QID) | ORAL | 0 refills | Status: AC | PRN
  Filled 2024-03-14: qty 20, 7d supply, fill #0

## 2024-03-14 MED ORDER — ASPIRIN 81 MG PO TBEC
162.0000 mg | DELAYED_RELEASE_TABLET | Freq: Every day | ORAL | 2 refills | Status: DC
  Filled 2024-03-14: qty 60, 30d supply, fill #0

## 2024-03-14 MED ORDER — MUPIROCIN CALCIUM 2 % EX CREA: TOPICAL_CREAM | Freq: Three times a day (TID) | CUTANEOUS | Status: DC

## 2024-03-14 MED ORDER — FONDAPARINUX SODIUM 7.5 MG/0.6ML ~~LOC~~ SOLN: 7.5000 mg | SUBCUTANEOUS | 2 refills | Status: AC

## 2024-03-14 MED ORDER — ASPIRIN 81 MG PO TBEC: 162.0000 mg | DELAYED_RELEASE_TABLET | Freq: Every day | ORAL | 2 refills | Status: AC

## 2024-03-14 MED ORDER — CEPHALEXIN 500 MG PO CAPS
500.0000 mg | ORAL_CAPSULE | Freq: Three times a day (TID) | ORAL | 0 refills | Status: AC
  Filled 2024-03-14: qty 15, 5d supply, fill #0

## 2024-03-14 MED ORDER — FONDAPARINUX SODIUM 7.5 MG/0.6ML ~~LOC~~ SOLN
7.5000 mg | SUBCUTANEOUS | 2 refills | Status: DC
  Filled 2024-03-14: qty 18, 30d supply, fill #0
  Filled 2024-03-14: qty 4.2, 7d supply, fill #0

## 2024-03-14 MED ORDER — VITAMIN B-12 1000 MCG PO TABS: 1000.0000 ug | ORAL_TABLET | Freq: Every day | ORAL | 0 refills | Status: DC

## 2024-03-14 NOTE — Discharge Summary (Signed)
 Physician Discharge Summary  Shelby Conner NUU:[id number] DOB: 1971/02/11 DOA: 03/09/2024  PCP: Joaquin Music, NP  Admit date: 03/09/2024 Discharge date: 03/14/2024  Admitted From: Home  Discharge disposition: Home  Recommendations for Outpatient Follow-Up:   Follow up with your primary care provider in one week.  Check CBC, CMP, magnesium in the next visit Follow-up with heme at oncology as scheduled by the clinic. Follow-up with vascular surgery in 4 to 6 weeks.  Discharge Diagnosis:   Principal Problem:   DVT (deep venous thrombosis) (HCC) Active Problems:   Decompensated hepatic cirrhosis (HCC)   Type 2 diabetes mellitus without complication (HCC)   Hyponatremia   Primary hypercoagulable state (HCC)   Macrocytosis, subclinical hypothyroidism   Thrombocytopenia (HCC)   Occlusive thrombus   Discharge Condition: Improved.  Diet recommendation: Low sodium, heart healthy.  Carbohydrate-modified.    Wound care: None.  Code status: Full.   History of Present Illness:   Shelby Conner is a 53 y.o. female with past medical history of hyperlipidemia, protein S and C deficiency, fatty liver, diabetes mellitus and history of history of DVT and pulmonary embolism first diagnosed in 2006 when patient was pregnant on warfarin for a long time and had switched to low molecular weight heparin and also was briefly on Xarelto, was admitted in December 2024 for extensive DVT. At the time patient was was placed on Pradaxa presented to the hospital with worsening swelling and pain on her lower extremities.  Had an outpatient Doppler ultrasound which showed DVT of the left lower extremity.  Patient stated that she was compliant with Pradaxa and had not missed any doses.  Denied any chest pain or shortness of breath.  Of note, patient was recently admitted in February 2024 for extensive DVT of the lower extremities.  At that time vascular surgery was consulted and an patient  underwent pharmacomechanical thrombectomy with filter removal and IVC stent placement.  Patient does have a history of liver cirrhosis secondary to fatty liver and alcohol use and follows up with [facility] but stopped following once no longer a candidate for transplant.  In the ED, CT venogram showed IVC stent with nonocclusive thrombus and occlusive thrombus in the left external iliac vein, common femoral vein and femoral vein.  Dr. Lenell Antu vascular surgeon was consulted and patient was admitted hospital for thrombectomy and was started on heparin infusion.     Hospital Course:   Following conditions were addressed during hospitalization as listed below,  DVT of the left lower extremity with prior history of extensive DVT and PE with protein C and S deficiency. Left Lower extremity pain. Patient was on Pradaxa prior to this admission. Oncology and vascular surgery were consulted during hospitalization and underwent percutaneous mechanical thrombectomy on 03/10/2024.  Patient was initially continued on heparin drip and at this time has been transitioned to Arixtra as per oncology.  Arixtra will be continued on discharge.  Patient will follow-up with oncology and vascular surgery after discharge.   Diabetes mellitus type 2 with hyperglycemia  Continue Humalog and Tresiba from home.  Latest hemoglobin A1c of 8 .    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  Compensated at this time.  Used to follow-up at [facility] in the past but no longer since he is not candidate for transplant.   Hypothyroidism continue Synthroid.   Mild hypomagnesemia.  Replenished and improved.  Latest magnesium of 1.8.   Left forearm erythema and mild cellulitis.  Has been started  on Keflex and Bactroban.  No drainable area so far.  Advised warm compression.  Advised to seek medical attention for worsening symptoms including pus formation.  Will be given 5-day course of Keflex.  She is aware for  the need of attention if not getting better.  No fever.  White blood cell count within normal range.  Class I obesity.  Body mass index is 30 kg/m. Would benefit from ongoing weight loss as outpatient.   Macrocytosis secondary to vitamin B12.  Continue vitamin B12 supplements.   Thrombocytopenia secondary to cirrhosis of liver.  Latest platelet count of 141.  HIT panel was negative during last admission in December 2024.  Oncology  okay for anticoagulation  Disposition.  At this time, patient is stable for disposition home with outpatient PCP, oncology and vascular surgery follow-up  Medical Consultants:   Vascular surgery Oncology  Procedures:    percutaneous mechanical thrombectomy on 03/10/2024 of the left lower extremity by vascular surgery  Subjective:   Today, patient was seen and examined at bedside.  Complains of mild pain in the leg and some redness about the left forearm.  Denies any shortness of breath, chest pain.  Fever or chills.  Discharge Exam:   Vitals:   03/13/24 2021 03/14/24 0434  BP: (!) 145/78 119/67  Pulse: 75 (!) 50  Resp: 18 18  Temp: 98.9 F (37.2 C) 97.9 F (36.6 C)  SpO2: 97% 97%   Vitals:   03/13/24 1157 03/13/24 1539 03/13/24 2021 03/14/24 0434  BP: 121/78 114/73 (!) 145/78 119/67  Pulse: 60 74 75 (!) 50  Resp: 16 18 18 18   Temp: 98.5 F (36.9 C) 99 F (37.2 C) 98.9 F (37.2 C) 97.9 F (36.6 C)  TempSrc: Oral Oral Oral Oral  SpO2: 100%  97% 97%  Weight:      Height:       Body mass index is 30 kg/m.   General: Alert awake, not in obvious distress, obese built HENT: pupils equally reacting to light,  No scleral pallor or icterus noted. Oral mucosa is moist.  Chest:   Diminished breath sounds bilaterally. No crackles or wheezes.  CVS: S1 &S2 heard. No murmur.  Regular rate and rhythm. Abdomen: Soft, nontender, nondistended.  Bowel sounds are heard.   Extremities: No cyanosis, clubbing or edema.  Peripheral pulses are palpable.   Left leg with edema erythema but decreased intensity.  Left forearm with some redness. Psych: Alert, awake and oriented, normal mood CNS:  No cranial nerve deficits.  Power equal in all extremities.   Skin: Warm and dry.    The results of significant diagnostics from this hospitalization (including imaging, microbiology, ancillary and laboratory) are listed below for reference.     Diagnostic Studies:   PERIPHERAL VASCULAR CATHETERIZATION Result Date: 03/10/2024 Images from the original result were not included. Patient name: Shelby Conner MRN: [id number] DOB: Mar 16, 1971 Sex: female 03/10/2024 Pre-operative Diagnosis: Extensive left lower extremity DVT with thrombus in the IVC stent and left common iliac vein stent Post-operative diagnosis:  Same Surgeon:  Apolinar Junes C. Randie Heinz, MD Procedure Performed: 1.  Percutaneous ultrasound-guided cannulation left popliteal vein 2.  Left lower extremity and central venogram 3.  Intravascular ultrasound left popliteal, left femoral, left common femoral, left external and common iliac veins and IVC 4.  Percutaneous mechanical thrombectomy using Inari Clottriever XL and bold catheters of IVC, left common and external iliac vein including left common iliac vein stent, left common femoral vein and femoral vein 5.  Balloon venoplasty of left common and external iliac veins and common femoral vein with 10 mm balloon and venoplasty of left femoral vein and popliteal vein with 7 mm balloon 6.  Moderate sedation with fentanyl Versed for 92 minutes Indications: 53 year old female with a remote history of IVC filter and left common iliac vein stent placement.  1 year ago she underwent removal of the filter with stenting of the IVC for right lower extremity edema.  She now presents with extensive left lower extremity DVT and edema with thrombus in the IVC stent as well as occlusion of the common iliac vein stent in the common femoral vein by CT venogram.  She is now indicated  for venography with intravascular ultrasound and possible thrombectomy with possible repeat stenting. Findings: The popliteal vein was difficult to access.  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  The IVC stent had initially approximately 75% stenosis this was reduced to less than 30% residual stenosis after mechanical thrombectomy there was no ballooning performed of the IVC stent.  The left common iliac vein stent was also subtotally occluded and after thrombectomy and balloon venoplasty this was reduced to 0% residual stenosis and there was brisk flow through the stent.  Mechanical thrombectomy also removed what appeared to be approximately a 4 cm balloon that was not known to be present within the stents over the veins and was likely from a previous procedure.  There was chronic and subacute thrombus of the common femoral vein which was removed with thrombectomy and balloon venoplasty reduced this to less than 20% residual stenosis and there was a brisk flow channel through there without any reflux into the profunda vein and the femoral vein which was initially occluded and ballooned with 7 mm balloon then demonstrated a brisk flow channel as well without reflux into collateral veins.  Procedure:  The patient was identified in the holding area and taken to room 8.  The patient was then placed prone on the table and prepped and draped in the usual sterile fashion.  A time out was called.  Ultrasound was used to evaluate initially the left small saphenous vein but after multiple unsuccessful attempts to get a wire to pass I then cannulated the popliteal vein but again did not have backbleeding but was able to get a wire to pass under fluoroscopic guidance.  At this time 7000 units of heparin was administered.  I then placed a micropuncture sheath followed by a Glidewire advantage.  I placed an 8 French  sheath and performed venography which demonstrated occlusion of the femoral vein.  I was able to cross this occlusion in the mid leg with Glidewire advantage and then performed balloon venoplasty with 7 mm balloon.  I then placed the Inari 16 French sheath.  This was also ballooned open at the base with a 7 mm balloon.  We then were able to get the Glidewire advantage up to the IVC and perform venography of the IVC and then a Rosen wire initially followed by an Amplatz wire was placed into the right subclavian vein.  We performed intravascular ultrasound including the IVC, left common and external iliac veins left, left common femoral and femoral veins.  We then began with the clot Treiver XL which was used within the IVC stent and was removed through the common iliac vein stent and the femoral vein  not fully deployed.  This was used for 3 separate passes until there was a clear pass and the clot retrieved all appeared chronic and subacute.  We then used the clot Treiver bold in the left common iliac vein stent and left common femoral vein.  We then performed balloon venoplasty of the left common iliac vein stent and left common femoral vein with 10 mm balloon.  Completion demonstrated brisk flow channel through the femoral vein into the common femoral vein on the left and into the left common and external neck veins and IVC.  IVUS demonstrated much improved flow channel through the IVC stent with no residual stenosis in the left common iliac vein stent and a a large flow channel through the common femoral vein and a channel in the femoral vein as well.  Satisfied with this the wire was removed and the cannulation site was suture-ligated with 4-0 Monocryl suture.  The patient tolerated the procedure without any complication. Contrast: 60 cc Brandon C. Randie Heinz, MD [facility] Office: [phone] Pager: [phone]   CT VENOGRAM ABD/PELVIS/LOWER EXT BILAT Result Date:  03/09/2024 CLINICAL DATA:  DVT suspected. History of DVT. Do DVT 7 days ago now with worsening pain to left thigh and groin. EXAM: CT VENOGRAM ABDOMEN AND PELVIS AND LOWER EXTREMITY BILATERAL TECHNIQUE: Venographic phase images of the abdomen, pelvis and lower extremities were obtained following the administration of intravenous contrast. Multiplanar reformats and maximum intensity projections were generated. RADIATION DOSE REDUCTION: This exam was performed according to the departmental dose-optimization program which includes automated exposure control, adjustment of the mA and/or kV according to patient size and/or use of iterative reconstruction technique. CONTRAST:  OMNIPAQUE IOHEXOL 350 MG/ML SOLN COMPARISON:  CT 02/03/2023 FINDINGS: Lower chest: No acute abnormality. Hepatobiliary: Heterogenous perfusion of the liver and nodular contour compatible with cirrhosis. No circumscribed hepatic lesion. Bilobed gallbladder. Cholelithiasis. No evidence of acute cholecystitis. No biliary dilation. Pancreas: Unremarkable. Spleen: Unremarkable. Adrenals/Urinary Tract: Normal adrenal glands. No urinary calculi or hydronephrosis. Unremarkable bladder. Stomach/Bowel: Normal caliber large and small bowel. No bowel wall thickening. Stomach and appendix are within normal limits. Vascular/Lymphatic: Aortic atherosclerotic calcification. Occlusive thrombus in the left common femoral artery prominent periesophageal varices. Recanalized periumbilical vein. Reproductive: Enhancing nodule in the left anterior uterine fundus is presumed to represent a fibroid. No adnexal mass. Other: No free intraperitoneal fluid or air. Musculoskeletal: No acute fracture.  Swelling throughout the leg. IVC: IVC stent containing nonocclusive thrombus. There is moderate narrowing secondary to the thrombus. Portal and mesenteric veins: Patent. Bilateral iliac veins: Patent right iliac veins. Left common iliac vein stent is patent. Occlusive  thrombus in the distal left external iliac vein extending through the common femoral vein and into the superior superficial femoral vein. Right lower extremity: No DVT in the right lower extremity. Left lower extremity: Occlusive thrombus in the left common femoral artery and superior superficial femoral artery. Additional short segment occlusive thrombus in the distal superficial femoral artery (series 6/image 127). Patent popliteal vein. IMPRESSION: 1. IVC stent containing nonocclusive thrombus. There is moderate narrowing secondary to the thrombus. 2. Occlusive thrombus in the distal left external iliac vein extending through the common femoral vein and into the superior superficial femoral vein. Additional short segment occlusive thrombus in the distal left superficial femoral vein. 3. Cirrhosis with sequela of portal hypertension including esophageal varices. 4. Cholelithiasis. 5. Aortic Atherosclerosis (ICD10-I70.0). Critical Value/emergent results were called by telephone at the time of interpretation on 03/09/2024 at 7:41 pm  to provider BROOKE SMALL , who verbally acknowledged these results. Electronically Signed   By: Minerva Fester M.D.   On: 03/09/2024 19:43     Labs:   Basic Metabolic Panel: Recent Labs  Lab 03/09/24 1527 03/10/24 0030 03/11/24 0546 03/12/24 0658  NA 133* 136 134* 133*  K 3.6 3.3* 3.6 4.1  CL 96* 101 100 102  CO2 21* 26 24 24   GLUCOSE 368* 159* 107* 112*  BUN 12 9 9 6   CREATININE 0.90 0.81 0.77 0.70  CALCIUM 8.8* 8.7* 7.8* 8.1*  MG  --   --  1.6* 1.8   GFR Estimated Creatinine Clearance: 78.5 mL/min (by C-G formula based on SCr of 0.7 mg/dL). Liver Function Tests: Recent Labs  Lab 03/09/24 1527  AST 36  ALT 24  ALKPHOS 119  BILITOT 2.1*  PROT 7.1  ALBUMIN 3.1*   No results for input(s): "LIPASE", "AMYLASE" in the last 168 hours. No results for input(s): "AMMONIA" in the last 168 hours. Coagulation profile Recent Labs  Lab 03/09/24 1527  INR 1.8*     CBC: Recent Labs  Lab 03/10/24 0030 03/11/24 0546 03/12/24 0658 03/13/24 0500 03/14/24 0422  WBC 5.6 5.5 5.8 5.4 6.1  HGB 12.9 11.2* 11.6* 11.9* 12.3  HCT 37.1 31.5* 33.1* 33.9* 35.7*  MCV 103.9* 103.3* 104.7* 105.6* 105.0*  PLT 93* 82* 102* 128* 141*   Cardiac Enzymes: No results for input(s): "CKTOTAL", "CKMB", "CKMBINDEX", "TROPONINI" in the last 168 hours. BNP: Invalid input(s): "POCBNP" CBG: Recent Labs  Lab 03/12/24 1609 03/13/24 0718 03/13/24 1103 03/13/24 1613 03/14/24 0711  GLUCAP 112* 104* 146* 158* 118*   D-Dimer No results for input(s): "DDIMER" in the last 72 hours. Hgb A1c No results for input(s): "HGBA1C" in the last 72 hours. Lipid Profile No results for input(s): "CHOL", "HDL", "LDLCALC", "TRIG", "CHOLHDL", "LDLDIRECT" in the last 72 hours. Thyroid function studies No results for input(s): "TSH", "T4TOTAL", "T3FREE", "THYROIDAB" in the last 72 hours.  Invalid input(s): "FREET3" Anemia work up No results for input(s): "VITAMINB12", "FOLATE", "FERRITIN", "TIBC", "IRON", "RETICCTPCT" in the last 72 hours. Microbiology No results found for this or any previous visit (from the past 240 hours).   Discharge Instructions:   Discharge Instructions     Call MD for:  severe uncontrolled pain   Complete by: As directed    Call MD for:  temperature >100.4   Complete by: As directed    Diet general   Complete by: As directed    Discharge instructions   Complete by: As directed    Follow-up with your primary care provider in 1 week.  Follow-up with vascular surgery in 4 to 6 weeks, April 30th.  Follow-up with oncology as scheduled by the clinic/you.  Take blood thinners as prescribed.  Seek medical attention for worsening symptoms.   Increase activity slowly   Complete by: As directed       Allergies as of 03/14/2024       Reactions   Morphine Itching   Per pt         Medication List     STOP taking these medications    dabigatran 150  MG Caps capsule Commonly known as: PRADAXA   desvenlafaxine 25 MG 24 hr tablet Commonly known as: PRISTIQ       TAKE these medications    aspirin EC 81 MG tablet Take 2 tablets (162 mg total) by mouth daily. Swallow whole.   cephALEXin 500 MG capsule Commonly known as: KEFLEX Take  1 capsule (500 mg total) by mouth every 8 (eight) hours for 5 days.   cyanocobalamin 1000 MCG tablet Commonly known as: VITAMIN B12 Take 1 tablet (1,000 mcg total) by mouth daily.   ergocalciferol 1.25 MG (50000 UT) capsule Commonly known as: VITAMIN D2 Take 50,000 Units by mouth once a week. Mondays   ezetimibe 10 MG tablet Commonly known as: ZETIA Take 10 mg by mouth daily.   folic acid 400 MCG tablet Commonly known as: FOLVITE Take 0.5 tablets by mouth daily.   fondaparinux 7.5 MG/0.6ML Soln injection Commonly known as: ARIXTRA Inject 0.6 mLs (7.5 mg total) into the skin daily.   FreeStyle Libre 3 Plus Sensor Misc change every 15 days for 30 days   HumaLOG KwikPen 100 UNIT/ML KwikPen Generic drug: insulin lispro Inject into the skin as directed. per sliding scale Subcutaneous three times per day for 30 days   levothyroxine 50 MCG tablet Commonly known as: SYNTHROID Take 50 mcg by mouth daily.   mupirocin cream 2 % Commonly known as: BACTROBAN Apply topically 3 (three) times daily.   oxyCODONE-acetaminophen 10-325 MG tablet Commonly known as: Percocet Take 1 tablet by mouth every 6 (six) hours as needed for up to 10 days for pain.   POTASSIMIN PO Take 1 tablet by mouth every evening.   Evaristo Bury FlexTouch 100 UNIT/ML FlexTouch Pen Generic drug: insulin degludec Inject 26 Units into the skin daily.        Follow-up Information     [facility] Follow up in 5 week(s).   Specialty: Vascular Surgery Contact information: [address] [place] [address] [phone]        Josph Macho, MD Follow up.    Specialty: Oncology Contact information: [address] [place] [address] [phone]         Joaquin Music, NP Follow up in 1 week(s).   Specialty: Nurse Practitioner Contact information: [address] [place] [address] [phone]                  Time coordinating discharge: 39 minutes  Signed:  Natally Ribera  [facility] 03/14/2024, 2:41 PM

## 2024-03-14 NOTE — Progress Notes (Signed)
 Overall, [name] is doing good.  She is on Arixtra now.  Apparently this been all approved by her insurance company.  I think the problem now is this infection on the left forearm.  She is on Bactroban and Keflex.  There is a little bit of a discharge from this lesion.  She has had no fever.  There is a little bit of erythema and firmness associated with this lesion.  Her CBC shows white cell count 6.1.  Hemoglobin 12.3.  Platelet count 141,000.  She has had no fever.  Her left leg looks a bit better.  He does not look as hyperemic.  It does not appear and feels firm.  She is ambulating.  She is having no problems going to the bathroom.  Her vital signs are all stable.  Temperature 97.9.  Pulse 50.  Blood pressure 119/67.  Lungs are clear bilaterally.  Cardiac exam regular rate and rhythm.  Abdomen is soft.  Bowel sounds are present.  Her extremities shows less swelling and redness in the left leg.  She has good pulses.  In her left arm, she has the wound.  This is somewhat erythematous.  It is quite firm.   Again, I think she can probably go home today.  We can certainly follow her up in the office.  Her left leg is looking better.  She is wearing her compression stockings when she is out and about.  I do appreciate the great care that she is gotten from everybody up on 6 E.   Christin Bach, MD  [place] 12:12

## 2024-03-14 NOTE — Plan of Care (Signed)
  Problem: Health Behavior/Discharge Planning: Goal: Ability to manage health-related needs will improve Outcome: Progressing   Problem: Clinical Measurements: Goal: Respiratory complications will improve Outcome: Progressing Goal: Cardiovascular complication will be avoided Outcome: Progressing   Problem: Activity: Goal: Risk for activity intolerance will decrease Outcome: Progressing   Problem: Nutrition: Goal: Adequate nutrition will be maintained Outcome: Progressing   Problem: Pain Managment: Goal: General experience of comfort will improve and/or be controlled Outcome: Progressing   Problem: Safety: Goal: Ability to remain free from injury will improve Outcome: Progressing   Problem: Nutritional: Goal: Maintenance of adequate nutrition will improve Outcome: Progressing   Problem: Tissue Perfusion: Goal: Adequacy of tissue perfusion will improve Outcome: Progressing   Problem: Activity: Goal: Ability to return to baseline activity level will improve Outcome: Progressing   Problem: Cardiovascular: Goal: Ability to achieve and maintain adequate cardiovascular perfusion will improve Outcome: Progressing

## 2024-03-18 LAB — SURGICAL PATHOLOGY, GROSS ONLY (NOT ARMC)

## 2024-04-10 ENCOUNTER — Other Ambulatory Visit: Payer: Self-pay | Admitting: *Deleted

## 2024-04-10 DIAGNOSIS — I82491 Acute embolism and thrombosis of other specified deep vein of right lower extremity: Secondary | ICD-10-CM

## 2024-04-10 DIAGNOSIS — I82220 Acute embolism and thrombosis of inferior vena cava: Secondary | ICD-10-CM

## 2024-04-23 ENCOUNTER — Ambulatory Visit (HOSPITAL_COMMUNITY): Attending: Vascular Surgery

## 2024-04-23 ENCOUNTER — Ambulatory Visit (HOSPITAL_COMMUNITY)

## 2024-06-03 ENCOUNTER — Inpatient Hospital Stay: Attending: Family

## 2024-06-03 ENCOUNTER — Inpatient Hospital Stay: Admitting: Hematology & Oncology

## 2024-06-14 ENCOUNTER — Emergency Department (HOSPITAL_COMMUNITY)
Admission: EM | Admit: 2024-06-14 | Discharge: 2024-06-14 | Disposition: A | Discharge status: Home / Self Care | Attending: Emergency Medicine | Admitting: Emergency Medicine

## 2024-06-14 ENCOUNTER — Encounter (HOSPITAL_COMMUNITY): Payer: Self-pay

## 2024-06-14 ENCOUNTER — Emergency Department (HOSPITAL_COMMUNITY)

## 2024-06-14 ENCOUNTER — Other Ambulatory Visit: Payer: Self-pay

## 2024-06-14 DIAGNOSIS — M7989 Other specified soft tissue disorders: Secondary | ICD-10-CM | POA: Diagnosis present

## 2024-06-14 MED ORDER — HYDROCODONE-ACETAMINOPHEN 5-325 MG PO TABS
1.0000 | ORAL_TABLET | Freq: Once | ORAL | Status: AC
  Administered 2024-06-14: 1 via ORAL
  Filled 2024-06-14: qty 1

## 2024-06-14 NOTE — ED Triage Notes (Signed)
 Patient reports left lower leg/foot swelling this week , patient stated long car trip from [place]  this week , history of DVT , faint pedal pulses /cap.refill <3 sec. No SOB or fever . Ambulatory .

## 2024-06-14 NOTE — Progress Notes (Signed)
 VASCULAR LAB    Left lower extremity venous duplex has been performed.  See CV proc for preliminary results.   Relayed results to Norleen Essex, PA-C  Jacqeline Broers, RVT 06/14/2024, 10:04 AM

## 2024-06-14 NOTE — Discharge Instructions (Addendum)
 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  Please continue taking your blood thinning medication as prescribed to you.  Please follow-up with vascular surgery.  If you have worsening pain, swelling or discoloration in your leg, have chest pain or shortness of breath or any other concerning symptom please return to the ED for further evaluation.

## 2024-06-14 NOTE — ED Provider Notes (Signed)
 [facility] AT [facility] Provider Note   CSN: [id number] Arrival date & time: 06/14/24  0105     Patient presents with: L Lower Leg Swelling   Shelby Conner is a 53 y.o. female with history of PE, DVT.  Patient presents to ED for evaluation of left lower extremity swelling and pain.  She reports that she traveled 8 days ago to [place]  where she was staying for 8 days.  She states that during the course of her trip she developed pain in her left lower extremity.  She reports that she then drove back this evening and her pain in her left leg is worsened.  She states the pain goes from her knee up into her left groin.  She reports a history of DVTs, PEs.  She reports compliance on blood thinning medications.  She denies any chest pain or shortness of breath.  Denies any fevers at home.   HPI     Prior to Admission medications   Medication Sig Start Date End Date Taking? Authorizing Provider  Continuous Glucose Sensor (FREESTYLE LIBRE 3 PLUS SENSOR) MISC change every 15 days for 30 days    [provider]  cyanocobalamin (VITAMIN B12) 1000 MCG tablet Take 1 tablet (1,000 mcg total) by mouth daily. 03/14/24   Pokhrel, Laxman, MD  ergocalciferol (VITAMIN D2) 1.25 MG (50000 UT) capsule Take 50,000 Units by mouth once a week. Mondays 02/08/24   [provider]  ezetimibe  (ZETIA ) 10 MG tablet Take 10 mg by mouth daily. 02/08/24   [provider]  folic acid  (FOLVITE ) 400 MCG tablet Take 0.5 tablets by mouth daily. 02/08/24   [provider]  fondaparinux  (ARIXTRA ) 7.5 MG/0.6ML SOLN injection Inject 0.6 mLs (7.5 mg total) into the skin daily. 03/14/24 06/12/24  Pokhrel, Laxman, MD  insulin  degludec (TRESIBA FLEXTOUCH) 100 UNIT/ML FlexTouch Pen Inject 26 Units into the skin daily.    [provider]  insulin  lispro (HUMALOG KWIKPEN) 100 UNIT/ML KwikPen Inject into the skin as directed. per sliding scale Subcutaneous  three times per day for 30 days 03/08/23   [provider]  levothyroxine  (SYNTHROID ) 50 MCG tablet Take 50 mcg by mouth daily. 02/05/24   [provider]  mupirocin  cream (BACTROBAN ) 2 % Apply topically 3 (three) times daily. 03/14/24   Pokhrel, Laxman, MD  Potassium (POTASSIMIN PO) Take 1 tablet by mouth every evening.    [provider]    Allergies: Morphine     Review of Systems  Cardiovascular:  Positive for leg swelling.  All other systems reviewed and are negative.   Updated Vital Signs BP (!) 144/86 (BP Location: Right Arm)   Pulse 77   Temp 97.7 F (36.5 C)   Resp 16   SpO2 97%   Physical Exam Vitals and nursing note reviewed.  Constitutional:      General: She is not in acute distress.    Appearance: She is well-developed.  HENT:     Head: Normocephalic and atraumatic.   Eyes:     Conjunctiva/sclera: Conjunctivae normal.    Cardiovascular:     Rate and Rhythm: Normal rate and regular rhythm.     Heart sounds: No murmur heard. Pulmonary:     Effort: Pulmonary effort is normal. No respiratory distress.     Breath sounds: Normal breath sounds.  Abdominal:     Palpations: Abdomen is soft.     Tenderness: There is no abdominal tenderness.   Musculoskeletal:  General: No swelling.     Cervical back: Neck supple.     Left lower leg: Edema present.     Comments: Left lower extremity edema.  2+ DP pulse present.   Skin:    General: Skin is warm and dry.     Capillary Refill: Capillary refill takes less than 2 seconds.   Neurological:     Mental Status: She is alert and oriented to person, place, and time. Mental status is at baseline.   Psychiatric:        Mood and Affect: Mood normal.     (all labs ordered are listed, but only abnormal results are displayed) Labs Reviewed - No data to display  EKG: None  Radiology: No results found.  Procedures   Medications Ordered in the ED  HYDROcodone -acetaminophen   (NORCO/VICODIN) 5-325 MG per tablet 1 tablet (has no administration in time range)    Medical Decision Making Risk Prescription drug management.   53 year old female presents for evaluation.  Please see HPI for further details.  On examination the patient is afebrile and nontachycardic.  Lung sounds are clear bilaterally, not hypoxic.  Abdomen soft and compressible.  Neurological examination at baseline.  Patient does have left lower extremity swelling.  2+ DP pulse present.  Denies chest pain or shortness of breath.  Patient given 5 mg hydrocodone .  Patient had ultrasound imaging ordered at this time.  Ultrasound imaging not available until 7:30 AM so patient will be signed out to oncoming provider [employer].  Plan of management discussed   Final diagnoses:  Left leg swelling    ED Discharge Orders     None          Ruthell Lonni JULIANNA DEVONNA 06/14/24 [id number]    Geroldine Berg, MD 06/14/24 [phone]

## 2024-06-14 NOTE — ED Provider Notes (Signed)
 Accepted handoff at shift change from Medford Lites,  PA-C. Please see prior provider note for more detail.   Briefly: Patient is 53 y.o. presenting for left lower leg pain after a recent trip to [place] .  DDX: concern for DVT, PAD, cellulitis, other.  Plan: US  DVT  Physical Exam  BP 104/70   Pulse 79   Temp 97.9 F (36.6 C) (Oral)   Resp 18   Ht 5' 2 (1.575 m)   Wt 72 kg   SpO2 97%   BMI 29.03 kg/m   Physical Exam  Procedures  Procedures  ED Course / MDM   Clinical Course as of 06/14/24 1138  Sat Jun 14, 2024  0631 Intoxicated. Left leg pain. Pending dvt study. Can f/u with vascular.  [JR]    Clinical Course User Index [JR] Khrystina Bonnes K, PA-C   Medical Decision Making Risk Prescription drug management.   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  Advised patient of these findings.  Advised that she continue taking her fondaparinux  and follow-up with vascular surgery.  Discussed return precautions.  Discharged.        Lang Norleen POUR, PA-C 06/14/24 1146    Kommor, [place], MD 06/14/24 [id number]
